# Patient Record
Sex: Female | Born: 1970
Health system: Southern US, Community
[De-identification: ages and names within clinical notes are randomized; demographics above are authoritative.]

## PROBLEM LIST (undated history)

## (undated) DIAGNOSIS — E119 Type 2 diabetes mellitus without complications: Secondary | ICD-10-CM

## (undated) DIAGNOSIS — J449 Chronic obstructive pulmonary disease, unspecified: Secondary | ICD-10-CM

## (undated) DIAGNOSIS — Z5189 Encounter for other specified aftercare: Secondary | ICD-10-CM

## (undated) DIAGNOSIS — D573 Sickle-cell trait: Secondary | ICD-10-CM

## (undated) DIAGNOSIS — I10 Essential (primary) hypertension: Secondary | ICD-10-CM

## (undated) DIAGNOSIS — K5090 Crohn's disease, unspecified, without complications: Secondary | ICD-10-CM

## (undated) DIAGNOSIS — K219 Gastro-esophageal reflux disease without esophagitis: Secondary | ICD-10-CM

## (undated) DIAGNOSIS — D649 Anemia, unspecified: Secondary | ICD-10-CM

## (undated) DIAGNOSIS — K5792 Diverticulitis of intestine, part unspecified, without perforation or abscess without bleeding: Secondary | ICD-10-CM

## (undated) DIAGNOSIS — IMO0001 Reserved for inherently not codable concepts without codable children: Secondary | ICD-10-CM

## (undated) HISTORY — PX: COLON SURGERY: SHX602

## (undated) HISTORY — PX: SMALL INTESTINE SURGERY: SHX150

---

## 1999-01-26 ENCOUNTER — Emergency Department (HOSPITAL_COMMUNITY): Admission: EM | Admit: 1999-01-26 | Discharge: 1999-01-26 | Payer: Self-pay | Admitting: Internal Medicine

## 1999-03-20 ENCOUNTER — Ambulatory Visit (HOSPITAL_BASED_OUTPATIENT_CLINIC_OR_DEPARTMENT_OTHER): Admission: RE | Admit: 1999-03-20 | Discharge: 1999-03-20 | Payer: Self-pay | Admitting: Orthopedic Surgery

## 1999-06-17 ENCOUNTER — Other Ambulatory Visit: Admission: RE | Admit: 1999-06-17 | Discharge: 1999-06-17 | Payer: Self-pay | Admitting: Obstetrics and Gynecology

## 1999-08-24 HISTORY — PX: ABDOMINAL HYSTERECTOMY: SHX81

## 1999-09-05 ENCOUNTER — Inpatient Hospital Stay (HOSPITAL_COMMUNITY): Admission: AD | Admit: 1999-09-05 | Discharge: 1999-09-05 | Payer: Self-pay | Admitting: Obstetrics & Gynecology

## 1999-11-08 ENCOUNTER — Inpatient Hospital Stay (HOSPITAL_COMMUNITY): Admission: AD | Admit: 1999-11-08 | Discharge: 1999-11-13 | Payer: Self-pay | Admitting: Obstetrics & Gynecology

## 1999-11-08 ENCOUNTER — Encounter (INDEPENDENT_AMBULATORY_CARE_PROVIDER_SITE_OTHER): Payer: Self-pay | Admitting: Specialist

## 2000-07-06 ENCOUNTER — Encounter: Payer: Self-pay | Admitting: Emergency Medicine

## 2000-07-06 ENCOUNTER — Emergency Department (HOSPITAL_COMMUNITY): Admission: EM | Admit: 2000-07-06 | Discharge: 2000-07-06 | Payer: Self-pay | Admitting: Emergency Medicine

## 2004-02-09 ENCOUNTER — Emergency Department (HOSPITAL_COMMUNITY): Admission: EM | Admit: 2004-02-09 | Discharge: 2004-02-09 | Payer: Self-pay | Admitting: Emergency Medicine

## 2004-11-23 ENCOUNTER — Emergency Department (HOSPITAL_COMMUNITY): Admission: EM | Admit: 2004-11-23 | Discharge: 2004-11-24 | Payer: Self-pay | Admitting: Emergency Medicine

## 2006-08-07 ENCOUNTER — Emergency Department (HOSPITAL_COMMUNITY): Admission: EM | Admit: 2006-08-07 | Discharge: 2006-08-07 | Payer: Self-pay | Admitting: Emergency Medicine

## 2006-12-15 ENCOUNTER — Emergency Department (HOSPITAL_COMMUNITY): Admission: EM | Admit: 2006-12-15 | Discharge: 2006-12-15 | Payer: Self-pay | Admitting: Emergency Medicine

## 2007-04-18 ENCOUNTER — Emergency Department (HOSPITAL_COMMUNITY): Admission: EM | Admit: 2007-04-18 | Discharge: 2007-04-18 | Payer: Self-pay | Admitting: Emergency Medicine

## 2007-09-18 ENCOUNTER — Emergency Department (HOSPITAL_COMMUNITY): Admission: EM | Admit: 2007-09-18 | Discharge: 2007-09-18 | Payer: Self-pay | Admitting: Emergency Medicine

## 2007-09-22 ENCOUNTER — Ambulatory Visit: Payer: Self-pay | Admitting: Internal Medicine

## 2007-09-22 ENCOUNTER — Ambulatory Visit: Payer: Self-pay | Admitting: *Deleted

## 2007-10-19 ENCOUNTER — Emergency Department (HOSPITAL_COMMUNITY): Admission: EM | Admit: 2007-10-19 | Discharge: 2007-10-19 | Payer: Self-pay | Admitting: Emergency Medicine

## 2007-11-15 ENCOUNTER — Encounter: Payer: Self-pay | Admitting: Family Medicine

## 2007-11-15 ENCOUNTER — Ambulatory Visit: Payer: Self-pay | Admitting: Internal Medicine

## 2007-11-15 LAB — CONVERTED CEMR LAB
Albumin: 4.2 g/dL (ref 3.5–5.2)
BUN: 7 mg/dL (ref 6–23)
Basophils Absolute: 0 10*3/uL (ref 0.0–0.1)
Basophils Relative: 0 % (ref 0–1)
CO2: 21 meq/L (ref 19–32)
Calcium: 9 mg/dL (ref 8.4–10.5)
Chloride: 109 meq/L (ref 96–112)
Creatinine, Ser: 0.67 mg/dL (ref 0.40–1.20)
Eosinophils Relative: 5 % (ref 0–5)
Hemoglobin: 13 g/dL (ref 12.0–15.0)
Lymphocytes Relative: 33 % (ref 12–46)
MCHC: 33.2 g/dL (ref 30.0–36.0)
MCV: 67.4 fL — ABNORMAL LOW (ref 78.0–100.0)
Monocytes Relative: 13 % — ABNORMAL HIGH (ref 3–12)
Neutro Abs: 2.2 10*3/uL (ref 1.7–7.7)
Potassium: 3.8 meq/L (ref 3.5–5.3)
RDW: 14.9 % (ref 11.5–15.5)
Total Bilirubin: 0.5 mg/dL (ref 0.3–1.2)
Total Protein: 7 g/dL (ref 6.0–8.3)
WBC: 4.6 10*3/uL (ref 4.0–10.5)

## 2007-11-21 ENCOUNTER — Ambulatory Visit (HOSPITAL_COMMUNITY): Admission: RE | Admit: 2007-11-21 | Discharge: 2007-11-21 | Payer: Self-pay | Admitting: Family Medicine

## 2007-11-28 ENCOUNTER — Encounter (INDEPENDENT_AMBULATORY_CARE_PROVIDER_SITE_OTHER): Payer: Self-pay | Admitting: Internal Medicine

## 2007-11-28 ENCOUNTER — Ambulatory Visit: Payer: Self-pay | Admitting: Family Medicine

## 2007-11-28 LAB — CONVERTED CEMR LAB
Iron: 76 ug/dL (ref 42–145)
Saturation Ratios: 30 % (ref 20–55)

## 2007-12-11 ENCOUNTER — Ambulatory Visit: Payer: Self-pay | Admitting: Internal Medicine

## 2008-03-11 ENCOUNTER — Ambulatory Visit: Payer: Self-pay | Admitting: Internal Medicine

## 2008-06-28 ENCOUNTER — Encounter (INDEPENDENT_AMBULATORY_CARE_PROVIDER_SITE_OTHER): Payer: Self-pay | Admitting: Adult Health

## 2008-06-28 ENCOUNTER — Ambulatory Visit: Payer: Self-pay | Admitting: Internal Medicine

## 2008-06-28 LAB — CONVERTED CEMR LAB
Alkaline Phosphatase: 51 units/L (ref 39–117)
BUN: 9 mg/dL (ref 6–23)
Basophils Absolute: 0 10*3/uL (ref 0.0–0.1)
Basophils Relative: 0 % (ref 0–1)
CO2: 24 meq/L (ref 19–32)
Creatinine, Ser: 0.75 mg/dL (ref 0.40–1.20)
Eosinophils Absolute: 0.2 10*3/uL (ref 0.0–0.7)
HCT: 38.5 % (ref 36.0–46.0)
Hemoglobin: 13 g/dL (ref 12.0–15.0)
MCV: 66.8 fL — ABNORMAL LOW (ref 78.0–100.0)
Neutro Abs: 3.7 10*3/uL (ref 1.7–7.7)
RBC: 5.76 M/uL — ABNORMAL HIGH (ref 3.87–5.11)
RDW: 14.3 % (ref 11.5–15.5)
Sodium: 143 meq/L (ref 135–145)
Total Bilirubin: 0.4 mg/dL (ref 0.3–1.2)
WBC: 6.9 10*3/uL (ref 4.0–10.5)

## 2008-07-07 ENCOUNTER — Emergency Department (HOSPITAL_COMMUNITY): Admission: EM | Admit: 2008-07-07 | Discharge: 2008-07-08 | Payer: Self-pay | Admitting: Emergency Medicine

## 2008-07-22 ENCOUNTER — Emergency Department (HOSPITAL_COMMUNITY): Admission: EM | Admit: 2008-07-22 | Discharge: 2008-07-22 | Payer: Self-pay | Admitting: Emergency Medicine

## 2008-08-25 ENCOUNTER — Emergency Department (HOSPITAL_COMMUNITY): Admission: EM | Admit: 2008-08-25 | Discharge: 2008-08-25 | Payer: Self-pay | Admitting: Family Medicine

## 2008-11-02 ENCOUNTER — Emergency Department (HOSPITAL_COMMUNITY): Admission: EM | Admit: 2008-11-02 | Discharge: 2008-11-02 | Payer: Self-pay | Admitting: Emergency Medicine

## 2009-02-12 ENCOUNTER — Emergency Department (HOSPITAL_COMMUNITY): Admission: EM | Admit: 2009-02-12 | Discharge: 2009-02-12 | Payer: Self-pay | Admitting: Emergency Medicine

## 2009-07-12 ENCOUNTER — Observation Stay (HOSPITAL_COMMUNITY): Admission: EM | Admit: 2009-07-12 | Discharge: 2009-07-14 | Payer: Self-pay | Admitting: Emergency Medicine

## 2009-10-09 ENCOUNTER — Encounter: Admission: RE | Admit: 2009-10-09 | Discharge: 2009-10-09 | Payer: Self-pay | Admitting: Family Medicine

## 2010-02-11 ENCOUNTER — Emergency Department (HOSPITAL_COMMUNITY): Admission: EM | Admit: 2010-02-11 | Discharge: 2010-02-11 | Payer: Self-pay | Admitting: Emergency Medicine

## 2010-11-25 LAB — DIFFERENTIAL
Basophils Absolute: 0.1 10*3/uL (ref 0.0–0.1)
Basophils Relative: 1 % (ref 0–1)
Lymphs Abs: 2.8 10*3/uL (ref 0.7–4.0)
Monocytes Absolute: 0.6 10*3/uL (ref 0.1–1.0)
Monocytes Relative: 7 % (ref 3–12)

## 2010-11-25 LAB — URINALYSIS, ROUTINE W REFLEX MICROSCOPIC
Bilirubin Urine: NEGATIVE
Nitrite: NEGATIVE
Urobilinogen, UA: 1 mg/dL (ref 0.0–1.0)
pH: 6.5 (ref 5.0–8.0)

## 2010-11-25 LAB — BASIC METABOLIC PANEL
BUN: 7 mg/dL (ref 6–23)
CO2: 25 mEq/L (ref 19–32)
CO2: 26 mEq/L (ref 19–32)
Calcium: 8.5 mg/dL (ref 8.4–10.5)
Calcium: 8.6 mg/dL (ref 8.4–10.5)
Chloride: 107 mEq/L (ref 96–112)
Chloride: 112 mEq/L (ref 96–112)
GFR calc Af Amer: >60 mL/min (ref >60)
GFR calc non Af Amer: >60 mL/min (ref >60)
GFR calc non Af Amer: >60 mL/min (ref >60)
Glucose, Bld: 90 mg/dL (ref 70–99)
Glucose, Bld: 94 mg/dL (ref 70–99)
Potassium: 3.4 mEq/L — ABNORMAL LOW (ref 3.5–5.1)
Potassium: 4 mEq/L (ref 3.5–5.1)
Potassium: 4.2 mEq/L (ref 3.5–5.1)
Sodium: 138 mEq/L (ref 135–145)
Sodium: 142 mEq/L (ref 135–145)

## 2010-11-25 LAB — CBC
HCT: 36.3 % (ref 36.0–46.0)
HCT: 37.7 % (ref 36.0–46.0)
HCT: 38.6 % (ref 36.0–46.0)
Hemoglobin: 11.2 g/dL — ABNORMAL LOW (ref 12.0–15.0)
Hemoglobin: 11.8 g/dL — ABNORMAL LOW (ref 12.0–15.0)
Hemoglobin: 12.4 g/dL (ref 12.0–15.0)
Hemoglobin: 12.5 g/dL (ref 12.0–15.0)
MCHC: 32.3 g/dL (ref 30.0–36.0)
MCHC: 32.9 g/dL (ref 30.0–36.0)
MCHC: 33 g/dL (ref 30.0–36.0)
MCV: 69.6 fL — ABNORMAL LOW (ref 78.0–100.0)
Platelets: 209 10*3/uL (ref 150–400)
Platelets: 242 10*3/uL (ref 150–400)
RBC: 5.21 MIL/uL — ABNORMAL HIGH (ref 3.87–5.11)
RBC: 5.43 MIL/uL — ABNORMAL HIGH (ref 3.87–5.11)
RDW: 14 % (ref 11.5–15.5)
RDW: 14.2 % (ref 11.5–15.5)
WBC: 4.9 10*3/uL (ref 4.0–10.5)
WBC: 8 10*3/uL (ref 4.0–10.5)

## 2010-11-25 LAB — COMPREHENSIVE METABOLIC PANEL
ALT: 13 U/L (ref 0–35)
AST: 18 U/L (ref 0–37)
Albumin: 3.8 g/dL (ref 3.5–5.2)
CO2: 25 mEq/L (ref 19–32)
GFR calc Af Amer: >60 mL/min (ref >60)
Glucose, Bld: 100 mg/dL — ABNORMAL HIGH (ref 70–99)
Potassium: 3.5 mEq/L (ref 3.5–5.1)
Sodium: 141 mEq/L (ref 135–145)

## 2010-11-25 LAB — GLUCOSE, CAPILLARY: Glucose-Capillary: 102 mg/dL — ABNORMAL HIGH (ref 70–99)

## 2010-11-30 LAB — URINALYSIS, ROUTINE W REFLEX MICROSCOPIC
Bilirubin Urine: NEGATIVE
Glucose, UA: NEGATIVE mg/dL
Hgb urine dipstick: NEGATIVE
Ketones, ur: 15 mg/dL — AB
Protein, ur: NEGATIVE mg/dL

## 2010-11-30 LAB — URINE CULTURE: Colony Count: >=100000

## 2010-11-30 LAB — CBC
Hemoglobin: 14.8 g/dL (ref 12.0–15.0)
MCHC: 32.8 g/dL (ref 30.0–36.0)
Platelets: 216 10*3/uL (ref 150–400)
RDW: 13.6 % (ref 11.5–15.5)

## 2010-11-30 LAB — COMPREHENSIVE METABOLIC PANEL
ALT: 12 U/L (ref 0–35)
Albumin: 3.9 g/dL (ref 3.5–5.2)
Calcium: 9.3 mg/dL (ref 8.4–10.5)
Glucose, Bld: 91 mg/dL (ref 70–99)
Sodium: 140 mEq/L (ref 135–145)
Total Protein: 7.5 g/dL (ref 6.0–8.3)

## 2010-11-30 LAB — LIPASE, BLOOD: Lipase: 14 U/L (ref 11–59)

## 2010-11-30 LAB — DIFFERENTIAL
Basophils Relative: 0 % (ref 0–1)
Eosinophils Relative: 1 % (ref 0–5)
Lymphocytes Relative: 12 % (ref 12–46)
Neutro Abs: 6.6 10*3/uL (ref 1.7–7.7)
Neutrophils Relative %: 82 % — ABNORMAL HIGH (ref 43–77)

## 2011-01-08 NOTE — Op Note (Signed)
[facility]  Patient:    Amanda Garner, Amanda Garner                     MRN: [id number] Proc. Date: 11/08/99 Adm. Date:  [id number] Attending:  Blanca Friend                           Operative Report  PREOPERATIVE DIAGNOSIS:  Abruption at 34 weeks.  POSTOPERATIVE DIAGNOSIS:  Abruption at 34 weeks, postpartum hemorrhage.  OPERATION:  Cesarean hysterectomy.  SURGEON:  Peterson Lombard, M.D.  ASSISTANT:  Donnel Saxon, C.N.M.  ANESTHESIA:  General.  ESTIMATED BLOOD LOSS:  2000 cc.  URINE OUTPUT:  250 cc.  FINDINGS:  Viable female infant, Apgars 2, 4 and 6.  Cord gas 6.81.  Weight is 4 pounds 6 ounces.  COMPLICATIONS:  Postpartum hemorrhage.  DRAINS:  Jackson-Pratt drain in the left intraperitoneal space.  There is a Jackson-Pratt drain to the right that is subfascial.  HISTORY:  The patient is a 40 year old gravida 3, para 2-0-0-2, who presented at 22 weeks estimated gestational age complaining of sudden onset of moderate vaginal  bleeding and severe abdominal pain that began on her way to the hospital.  The patient denied any trauma or previous contractions.  On initial vaginal examination, there was a small amount of bright red vaginal bleeding.  The vaginal examination showed a cervix that was 1 cm thick and the vertex was high. Uterine contractions were every one to two minutes, 30 seconds with minimal uterine relaxation in between.  Fetal heart tones were approximately 120s without decelerations or accelerations.  The abdomen was tender in all quadrants. Initially, the patient was bolused with magnesium sulfate in an attempt to diminish her contractions.  The bolus and infusion was running for approximately 30 minutes when the vaginal bleeding increased.  At this point the patient was consented for stat cesarean delivery.  DESCRIPTION OF PROCEDURE:  After an adequate level of general anesthesia was obtained, the patient was  prepped and draped in sterile fashion.  A Foley catheter had been placed in the bladder to drain the urine.  A low transverse incision was made down through subcutaneous fat to the fascia.  The fascia was incised with he scalpel.  The parietal peritoneum was entered by sharp dissection and the incision was extended bluntly.  The uterus was entered and the transverse cesarean fashion. The infants head was elevated and with fundal pressure, he was delivered.  He was suctioned on the operative field.  The cord was doubly clamped and cut and he was taken to the warmer where he was cared for by the nursing team.  Cord bloods were obtained.  The placenta was manually extracted.  Bladder pieces were placed inside the incision.  The uterus was grasped with ring clamps and the uterine incision was reapproximated with a running suture of #1 Vicryl. A second imbricating layer was used.  There was continued bright red vaginal bleeding from the fallopian tubes and from the uterine incision.  The uterus was removed from the pelvic region and massaged with a wet lap sponge.  Pitocin was given IV.  Methergine was given IM. Methergine was injected directly into the uterus.  An attempt was made to place a suture from the anterior-posterior region of the uterus in an attempt to achieve hemostasis.  This failed.  A red Robinson catheter was used to apply pressure at the level of the cervical  os to decrease the pulse pressure.  Patient continued to have a large amount of uterine bleeding.  At this point, it was discussed with he patients family and a plan was made to perform cesarean hysterectomy.  The round ligaments were then grasped and tied with a suture ligature of #0 Vicryl.  The round ligament was cut.  The broad ligament was pierced and the utero-ovarian ligaments were crossclamped, cut and suture ligated.  The uterine arteries were  then crossclamped, cut and suture ligated  bilaterally.  The uterosacral broad ligament complex was then crossclamped, cut and suture ligated to the level of he internal os.  The bladder was noted to be well below the surgical field.  The fundus was then removed.  The angles were made hemostatic with figure-of-eights at the 3 and 9 oclock position.  The top of the cervix was then closed with figure-of-eights of #0 Vicryl.  There were multiple points of oozing that was noted.  The majority of these were made hemostatic with figure-of-eights of #0 Vicryl.  Jackson-Pratt drain was then placed into the pelvis.  The pelvis was copiously irrigated.  This drain was brought to the left lower quadrant.  The peritoneum was then grasped and then reapproximated with a running suture of #3-0 Vicryl.  There was also a lot of subfascial bleeding.  An attempt was made to make it hemostatic with the Bovie and figure-of-eight sutures.  There was still a small amount of ooze that could not be controlled and therefore a subfascial Jackson-Pratt drain was placed and taken to the right lower quadrant.  The fascia was reapproximated with a running suture of #1 Vicryl.  The skin was closed with skin staples.  A pressure bandage was applied.  The patient tolerated the procedure well.  She was taken to the recovery room in stable condition.  Sponge, needle nd instrument counts were correct x 2. DD:  11/08/99 TD:  11/09/99 Job: [id number]

## 2011-01-08 NOTE — Discharge Summary (Signed)
[facility]  Patient:    Amanda Garner, Amanda Garner                     MRN: [id number] Adm. Date:  11/08/99 Disc. Date: 11/13/99 Attending:  Peterson Lombard, M.D. Dictator:   Donnel Saxon, C.N.M.                           Discharge Summary  ADMISSION DIAGNOSES:          1. Intrauterine pregnancy at 34 weeks.                               2. Third trimester bleeding.                               3. Previous cesarean section x 2.  DISCHARGE DIAGNOSES:          1. Abruptio placenta at 34 weeks.                               2. Postpartum hemorrhage.  PROCEDURES:                   1. Cesarean hysterectomy.                               2. General endotracheal anesthesia.                               3. Insertion of Jackson-Pratt drains.                               4. Magnesium sulfate bolus.  HISTORY OF PRESENT ILLNESS:   Ms. Amanda Garner is a 40 year old, gravid 3, para 2-0-0-2, who presented at 29 weeks on November 08, 1999, with sudden onset moderate vaginal bleeding and now severe abdominal pain that began on the way to the hospital following onset of bleeding.  The pregnancy had been remarkable for:  1. Two previous cesarean sections documented as low transverse.  2. Desired tubal ligation.  3. History of chorioamnionitis. 4. History of chronic hypertension, but no current medications.  5. Mild thrombocytopenia.  On admission, the patient was having a small amount of bright red bleeding.  Her abdomen was tender.  The fetal heart rate was reassuring.  Uterine contractions were every one to two minutes.  Peterson Lombard, M.D., was consulted and a magnesium sulfate bolus was begun. There was no change in the contraction pattern.  The cervix was 2 cm and the patient began to have more vaginal bleeding.  The fetal heart rate was in the 120s with no accelerations or decelerations.  The decision was made in light of the presumptive placental  abruption to proceed with cesarean section.  HOSPITAL COURSE:              The patient was taken to the operating room where a low transverse cesarean incision was made.  However, the patient continued to bleed.  The uterus was removed from the pelvic region and massaged with a wet lap sponge.  Pitocin was given IV.  Methergine was  given IM.  Methergine was also injected directly into the uterus.  There was an attempt made to place a suture from the anterior posterior region of the uterus in attempt to achieve hemostasis.  A large amount of uterine bleeding continued.  A consultation was held with the patients family and the plan was made to perform cesarean hysterectomy.  This was performed by Peterson Lombard, M.D.  The rest of the surgery was accomplished without significant difficulty.  There were multiple points of oozing that were made hemostatic with figure-of-eight sutures, but then two Jackson-Pratt drains were then placed in either lower quadrant.  The patient was taken to the recovery room in stable condition.  The infant was taken to he NICU for prematurity.  By postoperative day #1, the patient was doing well.  Her hemoglobin was 7.6, down from 8.8 predelivery.  Her platelets were 96, down from 106.  Her WBC count was 10.3.  Her physical exam was within normal limits.  She was having no dizziness.  The CBC was repeated, showing a hemoglobin of 6.4 and a platelet count of 87.  By postoperative day #2 again the patient was hemodynamically stable, but then had an episode of severe epigastric pain after eating.  She did have some milk products, which she has been typically intolerant to.  A Dulcolax suppository was given.  The patient then had resolution of this issue.  She began to have positive bowel sounds. The Jackson-Pratt drains continued to be within normal limits.  By postoperative day #3, the hemoglobin was 6.7 and platelets were 151,000.  The patient was beginning  to have some dizziness with ambulation.  Her temperature maximum was 100.1 degrees.  The risks and benefits of transfusion were reviewed.  She did elect to proceed with transfusion.  By postoperative day #4, the hemoglobin was up to 8.6.  The patient was having some reaction to the tape and Lotrisone cream was placed.  By postoperative day #5, the patient had been afebrile for greater than 24 hours.  She had a bowel movement.  Her drains were removed on November 12, 1999.  The rest of her physical exam was within normal limits and the infant was doing well.  Vanessa P. Haygood, M.D., saw the patient that day and elected to discharge her home with staples in place.  The decision was made to also try to arrange a consult with Aniceto Boss. Sherral Hammers, M.D., for evaluation of rash on her wrist.  DISPOSITION:                  The patient was deemed to have received full benefit of her hospital stay and was discharged home.  DISCHARGE INSTRUCTIONS:       Per [facility] handout and general hysterectomy discharge instructions.  DISCHARGE MEDICATIONS:        1. Motrin 600 mg p.o. q.6h. p.r.n. pain.                               2. Tylox one to two q.3-4h. p.r.n. pain.                               3. Senna-Gen one p.o. b.i.d.  DISCHARGE FOLLOW-UP:          Will occur on November 16, 1999, for staple removal and then at six weeks per [facility]  OB standards. DD:  06/07/00 TD:  06/07/00 Job: [id number]

## 2011-05-13 LAB — URINALYSIS, ROUTINE W REFLEX MICROSCOPIC
Bilirubin Urine: NEGATIVE
Glucose, UA: NEGATIVE
Hgb urine dipstick: NEGATIVE
Ketones, ur: NEGATIVE
pH: 7

## 2011-05-14 LAB — CBC
HCT: 36.6
MCV: 66.3 — ABNORMAL LOW
Platelets: 212
WBC: 5.3

## 2011-05-14 LAB — BASIC METABOLIC PANEL
BUN: 4 — ABNORMAL LOW
CO2: 20
Chloride: 108
Glucose, Bld: 87
Potassium: 3.7

## 2011-05-14 LAB — DIFFERENTIAL
Eosinophils Relative: 3
Lymphs Abs: 0.5 — ABNORMAL LOW
Monocytes Absolute: 0.5

## 2011-05-25 LAB — URINE MICROSCOPIC-ADD ON

## 2011-05-25 LAB — COMPREHENSIVE METABOLIC PANEL
ALT: 12
CO2: 22
Calcium: 8.6
Creatinine, Ser: 0.76
GFR calc non Af Amer: >60
Glucose, Bld: 112 — ABNORMAL HIGH
Total Bilirubin: 0.3

## 2011-05-25 LAB — CBC
Hemoglobin: 12
MCHC: 32.8
MCV: 69.3 — ABNORMAL LOW
RBC: 5.29 — ABNORMAL HIGH

## 2011-05-25 LAB — URINALYSIS, ROUTINE W REFLEX MICROSCOPIC
Bilirubin Urine: NEGATIVE
Hgb urine dipstick: NEGATIVE
Specific Gravity, Urine: 1.035 — ABNORMAL HIGH
Urobilinogen, UA: 0.2
pH: 6.5

## 2011-05-25 LAB — DIFFERENTIAL
Basophils Absolute: 0.1
Eosinophils Absolute: 0.3
Lymphocytes Relative: 36
Lymphs Abs: 2.8
Neutrophils Relative %: 50

## 2011-05-25 LAB — POCT PREGNANCY, URINE: Preg Test, Ur: NEGATIVE

## 2011-05-25 LAB — LIPASE, BLOOD: Lipase: 22

## 2011-06-04 LAB — I-STAT 8, (EC8 V) (CONVERTED LAB)
Acid-base deficit: 2
Bicarbonate: 22.1
Glucose, Bld: 96
Hemoglobin: 14.3
Operator id: 288831
Sodium: 138
TCO2: 23

## 2011-06-04 LAB — DIFFERENTIAL
Basophils Absolute: 0
Eosinophils Absolute: 0.2
Lymphocytes Relative: 22
Monocytes Relative: 9
Neutro Abs: 6.8
Neutrophils Relative %: 67

## 2011-06-04 LAB — URINALYSIS, ROUTINE W REFLEX MICROSCOPIC
Glucose, UA: NEGATIVE
Nitrite: NEGATIVE
Specific Gravity, Urine: 1.025
pH: 6

## 2011-06-04 LAB — URINE MICROSCOPIC-ADD ON

## 2011-06-04 LAB — CBC
MCHC: 33.3
Platelets: 250
RDW: 13.4

## 2011-07-31 ENCOUNTER — Encounter: Payer: Self-pay | Admitting: Nurse Practitioner

## 2011-07-31 ENCOUNTER — Emergency Department (HOSPITAL_COMMUNITY)
Admission: EM | Admit: 2011-07-31 | Discharge: 2011-07-31 | Disposition: A | Discharge status: Home / Self Care | Payer: Self-pay | Attending: Emergency Medicine | Admitting: Emergency Medicine

## 2011-07-31 DIAGNOSIS — R5383 Other fatigue: Secondary | ICD-10-CM | POA: Insufficient documentation

## 2011-07-31 DIAGNOSIS — R112 Nausea with vomiting, unspecified: Secondary | ICD-10-CM | POA: Insufficient documentation

## 2011-07-31 DIAGNOSIS — R059 Cough, unspecified: Secondary | ICD-10-CM | POA: Insufficient documentation

## 2011-07-31 DIAGNOSIS — R070 Pain in throat: Secondary | ICD-10-CM | POA: Insufficient documentation

## 2011-07-31 DIAGNOSIS — R5381 Other malaise: Secondary | ICD-10-CM | POA: Insufficient documentation

## 2011-07-31 DIAGNOSIS — R05 Cough: Secondary | ICD-10-CM | POA: Insufficient documentation

## 2011-07-31 DIAGNOSIS — R509 Fever, unspecified: Secondary | ICD-10-CM | POA: Insufficient documentation

## 2011-07-31 DIAGNOSIS — J3489 Other specified disorders of nose and nasal sinuses: Secondary | ICD-10-CM | POA: Insufficient documentation

## 2011-07-31 DIAGNOSIS — R630 Anorexia: Secondary | ICD-10-CM | POA: Insufficient documentation

## 2011-07-31 DIAGNOSIS — R6889 Other general symptoms and signs: Secondary | ICD-10-CM | POA: Insufficient documentation

## 2011-07-31 DIAGNOSIS — R51 Headache: Secondary | ICD-10-CM | POA: Insufficient documentation

## 2011-07-31 MED ORDER — OSELTAMIVIR PHOSPHATE 75 MG PO CAPS: 75.0000 mg | ORAL_CAPSULE | Freq: Two times a day (BID) | ORAL | Status: AC

## 2011-07-31 MED ORDER — ONDANSETRON 8 MG PO TBDP: 8.0000 mg | ORAL_TABLET | Freq: Three times a day (TID) | ORAL | Status: AC | PRN

## 2011-07-31 MED ORDER — HYDROCOD POLST-CHLORPHEN POLST 10-8 MG/5ML PO LQCR: 5.0000 mL | Freq: Two times a day (BID) | ORAL | Status: DC | PRN

## 2011-07-31 MED ORDER — DEXAMETHASONE SODIUM PHOSPHATE 10 MG/ML IJ SOLN: 10.0000 mg | Freq: Once | INTRAMUSCULAR | Status: DC

## 2011-07-31 MED ORDER — KETOROLAC TROMETHAMINE 60 MG/2ML IM SOLN: 60.0000 mg | Freq: Once | INTRAMUSCULAR | Status: DC

## 2011-07-31 MED ORDER — ONDANSETRON 4 MG PO TBDP: 8.0000 mg | ORAL_TABLET | Freq: Once | ORAL | Status: DC

## 2011-07-31 NOTE — ED Notes (Signed)
N/v/body aches/chills/fevrs since wednesday

## 2011-07-31 NOTE — ED Provider Notes (Signed)
Medical screening examination/treatment/procedure(s) were performed by non-physician practitioner and as supervising physician I was immediately available for consultation/collaboration.  Threasa Beards, MD 07/31/11 [phone]

## 2011-07-31 NOTE — ED Provider Notes (Signed)
History     CSN: [id number] Arrival date & time: 07/31/2011  9:40 AM   First MD Initiated Contact with Patient 07/31/11 1013      10:48 AM HPI Patient reports persistent flu-like symptoms since Thursday. States she's tried over-the-counter decongestants without relief. Reports a subjective fever.  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 States she's also began to have nausea and vomiting. Denies abdominal pain.  Patient is a 40 y.o. female presenting with flu symptoms.  Influenza This is a new problem. Episode onset: 2 days ago. Associated symptoms include anorexia, chills, congestion, coughing, fatigue, a fever, headaches, myalgias, nausea, a sore throat, vomiting and weakness. Pertinent negatives include no abdominal pain, chest pain, neck pain, numbness, rash, swollen glands or urinary symptoms.    History reviewed. No pertinent past medical history.  History reviewed. No pertinent past surgical history.  History reviewed. No pertinent family history.  History  Substance Use Topics  . Smoking status: Current Everyday Smoker -- 0.5 packs/day  . Smokeless tobacco: Not on file  . Alcohol Use: No    OB History    Grav Para Term Preterm Abortions TAB SAB Ect Mult Living                  Review of Systems  Constitutional: Positive for fever, chills and fatigue.  HENT: Positive for congestion and sore throat. Negative for neck pain.   Respiratory: Positive for cough.   Cardiovascular: Negative for chest pain.  Gastrointestinal: Positive for nausea, vomiting and anorexia. Negative for abdominal pain.  Genitourinary: Negative for dysuria, urgency, hematuria and flank pain.  Musculoskeletal: Positive for myalgias.  Skin: Negative for rash.  Neurological: Positive for weakness and headaches. Negative for numbness.  All other systems reviewed and are negative.    Allergies  Review of patient's allergies indicates no  known allergies.  Home Medications   Current Outpatient Rx  Name Route Sig Dispense Refill  . DM-DIPHENHYDRAMINE-APAP 10-12.5-325 MG/5ML PO LIQD Oral Take 10 mLs by mouth every 6 (six) hours.      Marland Kitchen ALKA-SELTZER PLUS COLD & FLU PO Oral Take 1 tablet by mouth 2 (two) times daily.        BP 128/87  Pulse 81  Temp(Src) 98.1 F (36.7 C) (Oral)  Resp 19  Ht 5' 6"  (1.676 m)  Wt 180 lb (81.647 kg)  BMI 29.05 kg/m2  SpO2 96%  Physical Exam  Vitals reviewed. Constitutional: She is oriented to person, place, and time. Vital signs are normal. She appears well-developed and well-nourished.  HENT:  Head: Normocephalic and atraumatic.  Right Ear: External ear normal.  Left Ear: External ear normal.  Nose: Nose normal.  Mouth/Throat: Oropharynx is clear and moist. No oropharyngeal exudate.  Eyes: Conjunctivae are normal. Pupils are equal, round, and reactive to light.  Neck: Normal range of motion. Neck supple. No spinous process tenderness and no muscular tenderness present. No rigidity.  Cardiovascular: Normal rate, regular rhythm and normal heart sounds.  Exam reveals no friction rub.   No murmur heard. Pulmonary/Chest: Effort normal and breath sounds normal. She has no wheezes. She has no rhonchi. She has no rales. She exhibits no tenderness.  Abdominal: Soft. Bowel sounds are normal. She exhibits no distension and no mass. There is no tenderness. There is no rebound and no guarding.  Musculoskeletal: Normal range of motion.  Neurological: She is alert and oriented to person, place, and time. No cranial  nerve deficit. Coordination normal.  Skin: Skin is warm and dry. No rash noted. No erythema. No pallor.    ED Course  Procedures   MDM          Sheliah Mends, [place] 07/31/11 1056

## 2011-08-25 ENCOUNTER — Emergency Department (HOSPITAL_COMMUNITY): Payer: Self-pay

## 2011-08-25 ENCOUNTER — Inpatient Hospital Stay (HOSPITAL_COMMUNITY)
Admission: EM | Admit: 2011-08-25 | Discharge: 2011-08-30 | DRG: 373 | Disposition: A | Discharge status: Home / Self Care | Payer: Self-pay | Source: Ambulatory Visit | Attending: Internal Medicine | Admitting: Internal Medicine

## 2011-08-25 ENCOUNTER — Encounter (HOSPITAL_COMMUNITY): Payer: Self-pay | Admitting: *Deleted

## 2011-08-25 DIAGNOSIS — F17200 Nicotine dependence, unspecified, uncomplicated: Secondary | ICD-10-CM | POA: Diagnosis present

## 2011-08-25 DIAGNOSIS — K5730 Diverticulosis of large intestine without perforation or abscess without bleeding: Secondary | ICD-10-CM | POA: Diagnosis present

## 2011-08-25 DIAGNOSIS — R197 Diarrhea, unspecified: Secondary | ICD-10-CM

## 2011-08-25 DIAGNOSIS — R933 Abnormal findings on diagnostic imaging of other parts of digestive tract: Secondary | ICD-10-CM

## 2011-08-25 DIAGNOSIS — K529 Noninfective gastroenteritis and colitis, unspecified: Secondary | ICD-10-CM | POA: Diagnosis present

## 2011-08-25 DIAGNOSIS — R935 Abnormal findings on diagnostic imaging of other abdominal regions, including retroperitoneum: Secondary | ICD-10-CM | POA: Diagnosis not present

## 2011-08-25 DIAGNOSIS — R109 Unspecified abdominal pain: Secondary | ICD-10-CM

## 2011-08-25 DIAGNOSIS — D509 Iron deficiency anemia, unspecified: Secondary | ICD-10-CM | POA: Diagnosis present

## 2011-08-25 DIAGNOSIS — E876 Hypokalemia: Secondary | ICD-10-CM | POA: Diagnosis not present

## 2011-08-25 DIAGNOSIS — K5900 Constipation, unspecified: Secondary | ICD-10-CM | POA: Diagnosis not present

## 2011-08-25 DIAGNOSIS — A0472 Enterocolitis due to Clostridium difficile, not specified as recurrent: Principal | ICD-10-CM

## 2011-08-25 DIAGNOSIS — K219 Gastro-esophageal reflux disease without esophagitis: Secondary | ICD-10-CM | POA: Diagnosis present

## 2011-08-25 HISTORY — DX: Reserved for inherently not codable concepts without codable children: IMO0001

## 2011-08-25 HISTORY — DX: Anemia, unspecified: D64.9

## 2011-08-25 HISTORY — DX: Diverticulitis of intestine, part unspecified, without perforation or abscess without bleeding: K57.92

## 2011-08-25 HISTORY — DX: Encounter for other specified aftercare: Z51.89

## 2011-08-25 HISTORY — DX: Sickle-cell trait: D57.3

## 2011-08-25 LAB — CBC
HCT: 40.4 % (ref 36.0–46.0)
MCH: 23.1 pg — ABNORMAL LOW (ref 26.0–34.0)
MCV: 66.8 fL — ABNORMAL LOW (ref 78.0–100.0)
RDW: 14.7 % (ref 11.5–15.5)
WBC: 7.2 10*3/uL (ref 4.0–10.5)

## 2011-08-25 LAB — COMPREHENSIVE METABOLIC PANEL
CO2: 20 mEq/L (ref 19–32)
Calcium: 9.3 mg/dL (ref 8.4–10.5)
Creatinine, Ser: 0.64 mg/dL (ref 0.50–1.10)
GFR calc Af Amer: >90 mL/min (ref >90)
GFR calc non Af Amer: >90 mL/min (ref >90)
Glucose, Bld: 100 mg/dL — ABNORMAL HIGH (ref 70–99)

## 2011-08-25 LAB — URINE MICROSCOPIC-ADD ON

## 2011-08-25 LAB — DIFFERENTIAL
Basophils Absolute: 0 10*3/uL (ref 0.0–0.1)
Eosinophils Relative: 2 % (ref 0–5)
Lymphocytes Relative: 18 % (ref 12–46)
Lymphs Abs: 1.3 10*3/uL (ref 0.7–4.0)
Monocytes Absolute: 0.5 10*3/uL (ref 0.1–1.0)

## 2011-08-25 LAB — URINALYSIS, ROUTINE W REFLEX MICROSCOPIC
Nitrite: NEGATIVE
Protein, ur: NEGATIVE mg/dL
Urobilinogen, UA: 0.2 mg/dL (ref 0.0–1.0)

## 2011-08-25 MED ORDER — SODIUM CHLORIDE 0.9 % IV BOLUS (SEPSIS)
1000.0000 mL | Freq: Once | INTRAVENOUS | Status: AC
  Administered 2011-08-25: 1000 mL via INTRAVENOUS

## 2011-08-25 MED ORDER — HYDROMORPHONE HCL PF 1 MG/ML IJ SOLN
1.0000 mg | Freq: Once | INTRAMUSCULAR | Status: AC
  Administered 2011-08-25: 1 mg via INTRAVENOUS
  Filled 2011-08-25: qty 1

## 2011-08-25 MED ORDER — ONDANSETRON HCL 4 MG/2ML IJ SOLN
4.0000 mg | Freq: Once | INTRAMUSCULAR | Status: AC
  Administered 2011-08-25: 4 mg via INTRAVENOUS
  Filled 2011-08-25: qty 2

## 2011-08-25 MED ORDER — IOHEXOL 300 MG/ML  SOLN
20.0000 mL | INTRAMUSCULAR | Status: AC
  Administered 2011-08-25: 20 mL via ORAL

## 2011-08-25 MED ORDER — IOHEXOL 300 MG/ML  SOLN
100.0000 mL | Freq: Once | INTRAMUSCULAR | Status: AC | PRN
  Administered 2011-08-25: 100 mL via INTRAVENOUS

## 2011-08-25 NOTE — ED Provider Notes (Signed)
History     CSN: [id number]  Arrival date & time 08/25/11  1456   First MD Initiated Contact with Patient 08/25/11 1805      Chief Complaint  Patient presents with  . Abdominal Pain  . Emesis  . Diarrhea    (Consider location/radiation/quality/duration/timing/severity/associated sxs/prior treatment) Patient is a 41 y.o. female presenting with abdominal pain, vomiting, and diarrhea.  Abdominal Pain The primary symptoms of the illness include abdominal pain, nausea, vomiting and diarrhea. The primary symptoms of the illness do not include fever or shortness of breath.  Symptoms associated with the illness do not include chills.  Emesis  Associated symptoms include abdominal pain and diarrhea. Pertinent negatives include no chills, no cough and no fever.  Diarrhea The primary symptoms include abdominal pain, nausea, vomiting and diarrhea. Primary symptoms do not include fever or rash.  The illness does not include chills.   history from patient Patient is a 41 year old female with history of diverticulitis who presents with abdominal pain. It is located in the periumbilical and lower abdomen. It started this morning abruptly. It has been constant and waxing and waning. No radiation. Patient notes associated nausea, vomiting (nonbilious nonbloody), and diarrhea. She has had mild bright red blood when wiping after stool but has not noted stools to have blood or blood in the toilet. She denies vaginal bleeding or vaginal discharge and has had partial hysterectomy. She notes mild dysuria over the last couple days. No history of recurrent UTI or prior pyelo.  She has not had fever. She states this does feel similar to prior diverticulitis except it is more severe. Patient has tried TUMS but pain has not improved. No other modifying factors noted. Overall severity moderate to severe.  Past Medical History  Diagnosis Date  . Diverticulitis   . Blood transfusion   . Anemia     Past Surgical  History  Procedure Date  . Cesarean section [id number]; 1997; 2001    Family History  Problem Relation Age of Onset  . Diabetes type II Mother   . Diabetes type II Father   . Stroke Father     History  Substance Use Topics  . Smoking status: Former Smoker -- 0.5 packs/day for 15 years    Types: Cigarettes    Quit date: 08/24/2011  . Smokeless tobacco: Never Used  . Alcohol Use: No    OB History    Grav Para Term Preterm Abortions TAB SAB Ect Mult Living                  Review of Systems  Constitutional: Negative for fever and chills.  HENT: Negative for facial swelling.   Eyes: Negative for visual disturbance.  Respiratory: Negative for cough, chest tightness, shortness of breath and wheezing.   Cardiovascular: Negative for chest pain.  Gastrointestinal: Positive for nausea, vomiting, abdominal pain and diarrhea. Negative for rectal pain.  Genitourinary: Negative for difficulty urinating.  Skin: Negative for rash.  Neurological: Negative for weakness and numbness.  Psychiatric/Behavioral: Negative for behavioral problems and confusion.  All other systems reviewed and are negative.    Allergies  Review of patient's allergies indicates no known allergies.  Home Medications  No current outpatient prescriptions on file.  BP 132/86  Pulse 80  Temp(Src) 98 F (36.7 C) (Oral)  Resp 16  Ht 5' 6"  (1.676 m)  Wt 185 lb (83.915 kg)  BMI 29.86 kg/m2  SpO2 97%  LMP 08/20/2011  Physical Exam  Nursing note and vitals  reviewed. Constitutional: She is oriented to person, place, and time. She appears well-developed and well-nourished. No distress.  HENT:  Head: Normocephalic.  Nose: Nose normal.  Eyes: EOM are normal.  Neck: Normal range of motion. Neck supple.  Cardiovascular: Normal rate, regular rhythm and intact distal pulses.   Pulmonary/Chest: Effort normal and breath sounds normal. No respiratory distress. She exhibits no tenderness.  Abdominal: Soft. She exhibits  no distension. There is tenderness (Moderate tenderness to palpation diffusely and moderate to severe tenderness to palpation in left lower quadrant. No rebound or guarding. No peritonitis. No suprapubic tenderness palpation).  Genitourinary:       Nontender rectal exam with normal colored stool and Hemoccult negative.  Musculoskeletal: Normal range of motion. She exhibits no edema and no tenderness.  Neurological: She is alert and oriented to person, place, and time.       Normal strength  Skin: Skin is warm and dry. No rash noted. She is not diaphoretic.  Psychiatric: She has a normal mood and affect. Her behavior is normal. Thought content normal.    ED Course  Procedures (including critical care time)  Labs Reviewed  CBC - Abnormal; Notable for the following:    RBC 6.05 (*)    MCV 66.8 (*)    MCH 23.1 (*)    All other components within normal limits  COMPREHENSIVE METABOLIC PANEL - Abnormal; Notable for the following:    Potassium 3.4 (*)    Glucose, Bld 100 (*)    All other components within normal limits  URINALYSIS, ROUTINE W REFLEX MICROSCOPIC - Abnormal; Notable for the following:    APPearance CLOUDY (*)    Hgb urine dipstick TRACE (*)    Ketones, ur 15 (*)    Leukocytes, UA TRACE (*)    All other components within normal limits  URINE MICROSCOPIC-ADD ON - Abnormal; Notable for the following:    Squamous Epithelial / LPF MANY (*)    All other components within normal limits  DIFFERENTIAL  LIPASE, BLOOD  OCCULT BLOOD, POC DEVICE  POCT OCCULT BLOOD STOOL, DEVICE  POCT CBG MONITORING  CLOSTRIDIUM DIFFICILE BY PCR  STOOL CULTURE  PREGNANCY, URINE   Ct Abdomen Pelvis W Contrast  08/25/2011  *RADIOLOGY REPORT*  Clinical Data: Left lower quadrant abdominal pain, vomiting, diarrhea  CT ABDOMEN AND PELVIS WITH CONTRAST  Technique:  Multidetector CT imaging of the abdomen and pelvis was performed following the standard protocol during bolus administration of intravenous  contrast.  Contrast: 58m OMNIPAQUE IOHEXOL 300 MG/ML IV SOLN, 1035mOMNIPAQUE IOHEXOL 300 MG/ML IV SOLN  Comparison: 02/12/2009  Findings: Mild dependent changes in the lung bases.  Small esophageal hiatal hernia with suggestion of mild thickening of the lower esophageal wall which might represent reflux disease.  The liver, spleen, gallbladder, pancreas, adrenal glands, abdominal aorta, and retroperitoneal lymph nodes are unremarkable.  Mild atrophy or scarring in the lower pole of the right kidney.  No solid mass or hydronephrosis demonstrated in the kidneys.  No gastric wall thickening.    Small broad-based umbilical hernia containing fat.  Adjacent circumscribed fat lobule in the subcutaneous soft tissues suggesting either lipoma or small fat containing hernia.  No free air in the abdomen.  Pelvis:  There is diffuse wall thickening and edema involving multiple loops of small bowel in the lower abdomen and pelvis. There is surrounding inflammatory stranding in the mesenteric fat. Small free fluid collections in the pericolic gutters and extending down into the pelvis. Density measurements of the  fluid are intermediate, likely representing ascites. Hemorrhage not entirely excluded.  The colon is decompressed and predominately stool filled although there is a suggestion of possible thickening of the wall of the cecal region.  Scattered diverticula.  The appearance is consistent with enteritis and may be due to inflammatory/infectious cause, Crohn disease, or ischemia.  Flow is demonstrated in the superior mesenteric artery and veins, arguing against ischemia.  No free air or pneumatosis.  No proximal small bowel dilatation.  Normal alignment of the lumbar vertebrae. Uterus and adnexal structures are not enlarged.  Diverticulosis of the sigmoid colon without inflammatory change.  Appendix is not visualized. Mesenteric lymph nodes are moderately prominent but not pathologically enlarged consistent with reactive  change.  IMPRESSION: Diffuse wall thickening and edema of the distal small bowel per involving ileum with mild dilatation and inflammatory stranding in the mesentery.  Small amount of fluid in the pelvis and mesentery. Possible involvement of the cecum.  Inflammatory or reactive lymph nodes in the mesentery.  Changes suggest enteritis and may represent Crohn disease or infectious/inflammatory cause.  Ischemia is less likely.  Results discussed with Dr. Larose Kells at the time of dictation, 2116 hours on 08/25/2011  Original Report Authenticated By: Neale Burly, M.D.     1. Enteritis       MDM    patient with abdominal pain. On her initial examination, patient with moderate to severe left-sided abdominal tenderness to palpation. Labs overall remarkable. Patient had received 2 rounds of IV narcotics and still with severe pain. Based on severity pain in location with known history of diverticulitis, CT scan ordered. Patient transferred to CDU and care transferred to CDU provider. CT scan pending.        Fritz Pickerel 08/26/11 0116

## 2011-08-25 NOTE — ED Notes (Signed)
States pain is 10/10 at its worst

## 2011-08-25 NOTE — ED Provider Notes (Signed)
8:35 PM Pt care resumed from Dr. Patria Mane patient presented to the emergency department with nausea, vomiting, diarrhea and a history of diverticulitis.  Patient's pain is located in the periumbilical region and has been managed with Dilaudid while in the emergency department.  A CT has been ordered to rule out acute diverticulitis and possible perforation.  Further treatment pending results of CT abdomen and pelvis.   Patient arrived dehydrated and tachycardic.  While in the emergency department she has received 2 L boluses of fluids.  In reassessing her vital signs she is currently hemodynamically stable and in no acute distress.  Patient's heart rate was 84 blood pressure 130/85 and her O2 saturation is 99.  Pt states does not have  PCP or GI doctor.   CT IMPRESSION: Diffuse wall thickening and edema of the distal small bowel per involving ileum with mild dilatation and inflammatory stranding in the mesentery. Small amount of fluid in the pelvis and mesentery. Possible involvement of the cecum. Inflammatory or reactive lymph nodes in the mesentery. Changes suggest enteritis and may represent Crohn disease or infectious/inflammatory cause. Ischemia is less likely.   10:21 PM Pt admitted to [facility] for Enteritis pain mngt and fluid hydration.  [place], [place] 08/25/11 2223

## 2011-08-25 NOTE — H&P (Signed)
Amanda Garner is an 41 y.o. female.  PCP - None.  Chief Complaint: Abdominal pain and nausea and vomiting. HPI: 41 year-old female with no significant past medical history presently complains of abdominal pain with multiple episodes of nausea and vomiting and diarrhea since today morning. She denies having had any uncooked food or recent travel or recent use of any antibiotics. The pain is periumbilical colicky in nature. In the ER patient had a CAT scan of the abdomen and pelvis which shows enteritis involving the ileum. Patient will be admitted for further management.  Past Medical History  Diagnosis Date  . Diverticulitis     Past Surgical History  Procedure Date  . Cesarean section     Family History  Problem Relation Age of Onset  . Diabetes type II Mother   . Diabetes type II Father   . Stroke Father    Social History:  reports that she has quit smoking. She does not have any smokeless tobacco history on file. She reports that she does not drink alcohol or use illicit drugs.  Allergies: No Known Allergies  Medications Prior to Admission  Medication Dose Route Frequency Provider Last Rate Last Dose  . HYDROmorphone (DILAUDID) injection 1 mg  1 mg Intravenous Once [employer]   1 mg at 08/25/11 1834  . HYDROmorphone (DILAUDID) injection 1 mg  1 mg Intravenous Once [employer]   1 mg at 08/25/11 1857  . HYDROmorphone (DILAUDID) injection 1 mg  1 mg Intravenous Once [employer]   1 mg at 08/25/11 2008  . HYDROmorphone (DILAUDID) injection 1 mg  1 mg Intravenous Once [place]   1 mg at 08/25/11 2150  . iohexol (OMNIPAQUE) 300 MG/ML solution 100 mL  100 mL Intravenous Once PRN Medication Radiologist   100 mL at 08/25/11 2107  . iohexol (OMNIPAQUE) 300 MG/ML solution 20 mL  20 mL Oral Q1 Hr x 2 Medication Radiologist   20 mL at 08/25/11 2107  . ondansetron (ZOFRAN) injection 4 mg  4 mg Intravenous Once [employer]   4 mg at 08/25/11 1833  . ondansetron  (ZOFRAN) injection 4 mg  4 mg Intravenous Once [employer]   4 mg at 08/25/11 2009  . ondansetron (ZOFRAN) injection 4 mg  4 mg Intravenous Once [place]   4 mg at 08/25/11 2150  . sodium chloride 0.9 % bolus 1,000 mL  1,000 mL Intravenous Once [employer]   1,000 mL at 08/25/11 1832  . sodium chloride 0.9 % bolus 1,000 mL  1,000 mL Intravenous Once [employer]   1,000 mL at 08/25/11 1832   No current outpatient prescriptions on file as of 08/25/2011.    Results for orders placed during the hospital encounter of 08/25/11 (from the past 48 hour(s))  CBC     Status: Abnormal   Collection Time   08/25/11  6:23 PM      Component Value Range Comment   WBC 7.2  4.0 - 10.5 (K/uL)    RBC 6.05 (*) 3.87 - 5.11 (MIL/uL)    Hemoglobin 14.0  12.0 - 15.0 (g/dL)    HCT 40.4  36.0 - 46.0 (%)    MCV 66.8 (*) 78.0 - 100.0 (fL)    MCH 23.1 (*) 26.0 - 34.0 (pg)    MCHC 34.7  30.0 - 36.0 (g/dL)    RDW 14.7  11.5 - 15.5 (%)    Platelets 184  150 - 400 (K/uL)   DIFFERENTIAL  Status: Normal   Collection Time   08/25/11  6:23 PM      Component Value Range Comment   Neutrophils Relative 73  43 - 77 (%)    Neutro Abs 5.3  1.7 - 7.7 (K/uL)    Lymphocytes Relative 18  12 - 46 (%)    Lymphs Abs 1.3  0.7 - 4.0 (K/uL)    Monocytes Relative 7  3 - 12 (%)    Monocytes Absolute 0.5  0.1 - 1.0 (K/uL)    Eosinophils Relative 2  0 - 5 (%)    Eosinophils Absolute 0.1  0.0 - 0.7 (K/uL)    Basophils Relative 0  0 - 1 (%)    Basophils Absolute 0.0  0.0 - 0.1 (K/uL)   COMPREHENSIVE METABOLIC PANEL     Status: Abnormal   Collection Time   08/25/11  6:23 PM      Component Value Range Comment   Sodium 135  135 - 145 (mEq/L)    Potassium 3.4 (*) 3.5 - 5.1 (mEq/L)    Chloride 103  96 - 112 (mEq/L)    CO2 20  19 - 32 (mEq/L)    Glucose, Bld 100 (*) 70 - 99 (mg/dL)    BUN 8  6 - 23 (mg/dL)    Creatinine, Ser 0.64  0.50 - 1.10 (mg/dL)    Calcium 9.3  8.4 - 10.5 (mg/dL)    Total Protein 7.4  6.0 - 8.3  (g/dL)    Albumin 3.6  3.5 - 5.2 (g/dL)    AST 15  0 - 37 (U/L)    ALT 9  0 - 35 (U/L)    Alkaline Phosphatase 57  39 - 117 (U/L)    Total Bilirubin 0.4  0.3 - 1.2 (mg/dL)    GFR calc non Af Amer >90  >90 (mL/min)    GFR calc Af Amer >90  >90 (mL/min)   LIPASE, BLOOD     Status: Normal   Collection Time   08/25/11  6:23 PM      Component Value Range Comment   Lipase 14  11 - 59 (U/L)   URINALYSIS, ROUTINE W REFLEX MICROSCOPIC     Status: Abnormal   Collection Time   08/25/11  6:24 PM      Component Value Range Comment   Color, Urine YELLOW  YELLOW     APPearance CLOUDY (*) CLEAR     Specific Gravity, Urine 1.023  1.005 - 1.030     pH 5.5  5.0 - 8.0     Glucose, UA NEGATIVE  NEGATIVE (mg/dL)    Hgb urine dipstick TRACE (*) NEGATIVE     Bilirubin Urine NEGATIVE  NEGATIVE     Ketones, ur 15 (*) NEGATIVE (mg/dL)    Protein, ur NEGATIVE  NEGATIVE (mg/dL)    Urobilinogen, UA 0.2  0.0 - 1.0 (mg/dL)    Nitrite NEGATIVE  NEGATIVE     Leukocytes, UA TRACE (*) NEGATIVE    URINE MICROSCOPIC-ADD ON     Status: Abnormal   Collection Time   08/25/11  6:24 PM      Component Value Range Comment   Squamous Epithelial / LPF MANY (*) RARE     WBC, UA 3-6  <3 (WBC/hpf)    RBC / HPF 0-2  <3 (RBC/hpf)    Bacteria, UA RARE  RARE     Urine-Other MUCOUS PRESENT     OCCULT BLOOD, POC DEVICE     Status: Normal  Collection Time   08/25/11  8:06 PM      Component Value Range Comment   Fecal Occult Bld NEGATIVE      Ct Abdomen Pelvis W Contrast  08/25/2011  *RADIOLOGY REPORT*  Clinical Data: Left lower quadrant abdominal pain, vomiting, diarrhea  CT ABDOMEN AND PELVIS WITH CONTRAST  Technique:  Multidetector CT imaging of the abdomen and pelvis was performed following the standard protocol during bolus administration of intravenous contrast.  Contrast: 69m OMNIPAQUE IOHEXOL 300 MG/ML IV SOLN, 1072mOMNIPAQUE IOHEXOL 300 MG/ML IV SOLN  Comparison: 02/12/2009  Findings: Mild dependent changes in the lung bases.   Small esophageal hiatal hernia with suggestion of mild thickening of the lower esophageal wall which might represent reflux disease.  The liver, spleen, gallbladder, pancreas, adrenal glands, abdominal aorta, and retroperitoneal lymph nodes are unremarkable.  Mild atrophy or scarring in the lower pole of the right kidney.  No solid mass or hydronephrosis demonstrated in the kidneys.  No gastric wall thickening.    Small broad-based umbilical hernia containing fat.  Adjacent circumscribed fat lobule in the subcutaneous soft tissues suggesting either lipoma or small fat containing hernia.  No free air in the abdomen.  Pelvis:  There is diffuse wall thickening and edema involving multiple loops of small bowel in the lower abdomen and pelvis. There is surrounding inflammatory stranding in the mesenteric fat. Small free fluid collections in the pericolic gutters and extending down into the pelvis. Density measurements of the fluid are intermediate, likely representing ascites. Hemorrhage not entirely excluded.  The colon is decompressed and predominately stool filled although there is a suggestion of possible thickening of the wall of the cecal region.  Scattered diverticula.  The appearance is consistent with enteritis and may be due to inflammatory/infectious cause, Crohn disease, or ischemia.  Flow is demonstrated in the superior mesenteric artery and veins, arguing against ischemia.  No free air or pneumatosis.  No proximal small bowel dilatation.  Normal alignment of the lumbar vertebrae. Uterus and adnexal structures are not enlarged.  Diverticulosis of the sigmoid colon without inflammatory change.  Appendix is not visualized. Mesenteric lymph nodes are moderately prominent but not pathologically enlarged consistent with reactive change.  IMPRESSION: Diffuse wall thickening and edema of the distal small bowel per involving ileum with mild dilatation and inflammatory stranding in the mesentery.  Small amount of  fluid in the pelvis and mesentery. Possible involvement of the cecum.  Inflammatory or reactive lymph nodes in the mesentery.  Changes suggest enteritis and may represent Crohn disease or infectious/inflammatory cause.  Ischemia is less likely.  Results discussed with Dr. PaLarose Kellst the time of dictation, 2116 hours on 08/25/2011  Original Report Authenticated By: WINeale BurlyM.D.    Review of Systems  Constitutional: Negative.   HENT: Negative.   Eyes: Negative.   Respiratory: Negative.   Cardiovascular: Negative.   Gastrointestinal: Positive for nausea, vomiting, abdominal pain and diarrhea.  Genitourinary: Negative.   Musculoskeletal: Negative.   Skin: Negative.   Neurological: Negative.   Endo/Heme/Allergies: Negative.   Psychiatric/Behavioral: Negative.     Blood pressure 128/85, pulse 83, temperature 98.2 F (36.8 C), temperature source Oral, resp. rate 16, height 5' 6"  (1.676 m), weight 83.915 kg (185 lb), SpO2 99.00%. Physical Exam  Constitutional: She is oriented to person, place, and time. She appears well-developed and well-nourished. No distress.  HENT:  Head: Normocephalic and atraumatic.  Right Ear: External ear normal.  Left Ear: External ear normal.  Nose: Nose normal.  Mouth/Throat: Oropharynx is clear and moist. No oropharyngeal exudate.  Eyes: Conjunctivae and EOM are normal. Pupils are equal, round, and reactive to light. Right eye exhibits no discharge. Left eye exhibits no discharge.  Neck: Normal range of motion. Neck supple.  Cardiovascular: Normal rate, regular rhythm and normal heart sounds.   Respiratory: Effort normal and breath sounds normal. No respiratory distress. She has no wheezes. She has no rales.  GI: Soft. Bowel sounds are normal. She exhibits no distension. There is tenderness. There is no rebound and no guarding.  Musculoskeletal: Normal range of motion. She exhibits no edema and no tenderness.  Neurological: She is alert and oriented to  person, place, and time.       Moves upper and lower limbs.  Skin: Skin is warm and dry. She is not diaphoretic.  Psychiatric: Her behavior is normal.     Assessment/Plan #1. Enteritis - as per the CAT scan it is involving the ileum. For now and will keep patient n.p.o. and hydrate. Once her nausea vomiting improves we may start clear liquids and advance as tolerated. We will get stool studies including culture and C. difficile. Patient will be placed on ciprofloxacin and Flagyl. Patient eventually will need a gastroenterology consult as inpatient or as outpatient may need to rule out inflammatory bowel disease. #2. History of diverticulitis.  CODE STATUS - full code  Magda Muise N. 08/25/2011, 11:40 PM

## 2011-08-25 NOTE — ED Notes (Signed)
Patient reports onset of n/v/d and feeling like something is biting at her navel.  Patient states she has never had sx like this before

## 2011-08-25 NOTE — ED Notes (Signed)
Complaining of abdominal pain with diarrhea and nausea which started this morning. States has history of diverticulitis. Rates the pain 9/10

## 2011-08-26 ENCOUNTER — Inpatient Hospital Stay (HOSPITAL_COMMUNITY): Payer: Self-pay

## 2011-08-26 ENCOUNTER — Encounter (HOSPITAL_COMMUNITY): Payer: Self-pay | Admitting: General Practice

## 2011-08-26 DIAGNOSIS — A0472 Enterocolitis due to Clostridium difficile, not specified as recurrent: Secondary | ICD-10-CM

## 2011-08-26 DIAGNOSIS — R109 Unspecified abdominal pain: Secondary | ICD-10-CM

## 2011-08-26 DIAGNOSIS — R197 Diarrhea, unspecified: Secondary | ICD-10-CM

## 2011-08-26 DIAGNOSIS — R933 Abnormal findings on diagnostic imaging of other parts of digestive tract: Secondary | ICD-10-CM

## 2011-08-26 LAB — OCCULT BLOOD X 1 CARD TO LAB, STOOL: Fecal Occult Bld: NEGATIVE

## 2011-08-26 LAB — RETICULOCYTES
RBC.: 5.1 MIL/uL (ref 3.87–5.11)
Retic Ct Pct: 1.7 % (ref 0.4–3.1)

## 2011-08-26 LAB — PREGNANCY, URINE: Preg Test, Ur: NEGATIVE

## 2011-08-26 LAB — GLUCOSE, CAPILLARY
Glucose-Capillary: 103 mg/dL — ABNORMAL HIGH (ref 70–99)
Glucose-Capillary: 109 mg/dL — ABNORMAL HIGH (ref 70–99)
Glucose-Capillary: 81 mg/dL (ref 70–99)

## 2011-08-26 LAB — VITAMIN B12: Vitamin B-12: 554 pg/mL (ref 211–911)

## 2011-08-26 LAB — FERRITIN: Ferritin: 210 ng/mL (ref 10–291)

## 2011-08-26 MED ORDER — ONDANSETRON HCL 4 MG PO TABS: 4.0000 mg | ORAL_TABLET | Freq: Four times a day (QID) | ORAL | Status: DC | PRN

## 2011-08-26 MED ORDER — WHITE PETROLATUM GEL
Status: AC
  Filled 2011-08-26: qty 5

## 2011-08-26 MED ORDER — BIOTENE DRY MOUTH MT LIQD
15.0000 mL | Freq: Two times a day (BID) | OROMUCOSAL | Status: DC
  Administered 2011-08-26 – 2011-08-27 (×3): 15 mL via OROMUCOSAL

## 2011-08-26 MED ORDER — ACETAMINOPHEN 650 MG RE SUPP: 650.0000 mg | Freq: Four times a day (QID) | RECTAL | Status: DC | PRN

## 2011-08-26 MED ORDER — CHLORHEXIDINE GLUCONATE 0.12 % MT SOLN
15.0000 mL | Freq: Two times a day (BID) | OROMUCOSAL | Status: DC
  Administered 2011-08-26 – 2011-08-27 (×2): 15 mL via OROMUCOSAL
  Filled 2011-08-26: qty 15

## 2011-08-26 MED ORDER — SODIUM CHLORIDE 0.9 % IV SOLN
INTRAVENOUS | Status: DC
  Administered 2011-08-26 – 2011-08-29 (×11): via INTRAVENOUS

## 2011-08-26 MED ORDER — HYDROMORPHONE HCL PF 1 MG/ML IJ SOLN
0.5000 mg | INTRAMUSCULAR | Status: DC | PRN
  Administered 2011-08-26 – 2011-08-27 (×5): 1 mg via INTRAVENOUS
  Filled 2011-08-26 (×6): qty 1

## 2011-08-26 MED ORDER — HYDROMORPHONE HCL PF 1 MG/ML IJ SOLN
1.0000 mg | Freq: Once | INTRAMUSCULAR | Status: AC
  Administered 2011-08-26: 1 mg via INTRAVENOUS
  Filled 2011-08-26: qty 1

## 2011-08-26 MED ORDER — SACCHAROMYCES BOULARDII 250 MG PO CAPS
250.0000 mg | ORAL_CAPSULE | Freq: Two times a day (BID) | ORAL | Status: DC
  Administered 2011-08-26 – 2011-08-29 (×7): 250 mg via ORAL
  Filled 2011-08-26 (×9): qty 1

## 2011-08-26 MED ORDER — HYDROMORPHONE HCL PF 1 MG/ML IJ SOLN
0.5000 mg | INTRAMUSCULAR | Status: DC | PRN
  Administered 2011-08-26 (×4): 0.5 mg via INTRAVENOUS
  Filled 2011-08-26 (×4): qty 1

## 2011-08-26 MED ORDER — ONDANSETRON HCL 4 MG/2ML IJ SOLN
4.0000 mg | INTRAMUSCULAR | Status: DC | PRN
  Administered 2011-08-26: 4 mg via INTRAVENOUS
  Filled 2011-08-26: qty 2

## 2011-08-26 MED ORDER — METRONIDAZOLE 500 MG PO TABS
500.0000 mg | ORAL_TABLET | Freq: Three times a day (TID) | ORAL | Status: DC
  Administered 2011-08-26 – 2011-08-30 (×13): 500 mg via ORAL
  Filled 2011-08-26 (×15): qty 1

## 2011-08-26 MED ORDER — PANTOPRAZOLE SODIUM 40 MG PO TBEC
40.0000 mg | DELAYED_RELEASE_TABLET | Freq: Every day | ORAL | Status: DC
  Administered 2011-08-26 – 2011-08-29 (×4): 40 mg via ORAL
  Filled 2011-08-26 (×5): qty 1

## 2011-08-26 MED ORDER — ACETAMINOPHEN 325 MG PO TABS
650.0000 mg | ORAL_TABLET | Freq: Four times a day (QID) | ORAL | Status: DC | PRN
  Administered 2011-08-28: 650 mg via ORAL
  Filled 2011-08-26: qty 2

## 2011-08-26 MED ORDER — ONDANSETRON HCL 4 MG/2ML IJ SOLN: 4.0000 mg | Freq: Four times a day (QID) | INTRAMUSCULAR | Status: DC | PRN

## 2011-08-26 NOTE — Progress Notes (Signed)
Utilization review completed.  

## 2011-08-26 NOTE — Consult Note (Signed)
[facility] Consultation  Referring Provider: [facility] Primary Care Physician:  Per Patient No Pcp Primary Gastroenterologist:   none Reason for Consultation:  Ileitis on CTscan  HPI: Amanda Garner is a 41 y.o. female admitted yesterday with nausea, vomiting, diarrhea and abdominal pain in setting of CTscan revealing enteritis / ileitis. Patient gives a history of chronic, diffuse, postprandial abdominal burning. Yesterday she developed acute, severe mid abdominal pain, especially periumbilical pain. For a month she has been having non-bloody diarrhea. Weight fluctuates, overall stable. No recent antibiotics. No skin rashes. She has frequent neck and shoulder pain but otherwise no joint pain. Other GI history includes frequent pyrosis for which she takes Tums. She also has a history of sigmoid diverticulosis. Patient has never had a colonoscopy / seen a gastroenterologist. No [place] of IBD.    Past Medical History  Diagnosis Date  . Diverticulitis   . Blood transfusion   . Anemia     Past Surgical History  Procedure Date  . Cesarean section [id number]; 1997; 2001    Prior to Admission medications   Not on File    Current Facility-Administered Medications  Medication Dose Route Frequency Provider Last Rate Last Dose  . 0.9 %  sodium chloride infusion   Intravenous Continuous Arshad N. Kakrakandy 150 mL/hr at 08/26/11 0801    . acetaminophen (TYLENOL) tablet 650 mg  650 mg Oral Q6H PRN Rise Patience       Or  . acetaminophen (TYLENOL) suppository 650 mg  650 mg Rectal Q6H PRN Rise Patience      . antiseptic oral rinse (BIOTENE) solution 15 mL  15 mL Mouth Rinse q12n4p Arshad N. Kakrakandy   15 mL at 08/26/11 1211  . chlorhexidine (PERIDEX) 0.12 % solution 15 mL  15 mL Mouth Rinse BID Arshad N. Kakrakandy   15 mL at 08/26/11 0801  . HYDROmorphone (DILAUDID) injection 0.5 mg  0.5 mg Intravenous Q4H PRN Doreatha Lew. Kakrakandy   0.5 mg at 08/26/11 1208  .  HYDROmorphone (DILAUDID) injection 1 mg  1 mg Intravenous Once [employer]   1 mg at 08/25/11 1834  . HYDROmorphone (DILAUDID) injection 1 mg  1 mg Intravenous Once [employer]   1 mg at 08/25/11 1857  . HYDROmorphone (DILAUDID) injection 1 mg  1 mg Intravenous Once [employer]   1 mg at 08/25/11 2008  . HYDROmorphone (DILAUDID) injection 1 mg  1 mg Intravenous Once [place]   1 mg at 08/25/11 2150  . HYDROmorphone (DILAUDID) injection 1 mg  1 mg Intravenous Once [place]   1 mg at 08/26/11 0014  . iohexol (OMNIPAQUE) 300 MG/ML solution 100 mL  100 mL Intravenous Once PRN Medication Radiologist   100 mL at 08/25/11 2107  . iohexol (OMNIPAQUE) 300 MG/ML solution 20 mL  20 mL Oral Q1 Hr x 2 Medication Radiologist   20 mL at 08/25/11 2107  . metroNIDAZOLE (FLAGYL) tablet 500 mg  500 mg Oral Q8H Adeline C Viyuoh   500 mg at 08/26/11 1211  . ondansetron (ZOFRAN) injection 4 mg  4 mg Intravenous Once [employer]   4 mg at 08/25/11 1833  . ondansetron (ZOFRAN) injection 4 mg  4 mg Intravenous Once [employer]   4 mg at 08/25/11 2009  . ondansetron (ZOFRAN) injection 4 mg  4 mg Intravenous Once [place], [place]   4 mg at 08/25/11 2150  . ondansetron (ZOFRAN) injection 4 mg  4 mg Intravenous Q4H  PRN Rise [employer]ience   4 mg at 08/26/11 0145  . ondansetron (ZOFRAN) tablet 4 mg  4 mg Oral Q6H PRN Rise Patience      . saccharomyces boulardii (FLORASTOR) capsule 250 mg  250 mg Oral BID Adeline C Viyuoh      . sodium chloride 0.9 % bolus 1,000 mL  1,000 mL Intravenous Once [employer]   1,000 mL at 08/25/11 1832  . sodium chloride 0.9 % bolus 1,000 mL  1,000 mL Intravenous Once [employer]   1,000 mL at 08/25/11 1832  . white petrolatum (VASELINE) gel           . DISCONTD: ondansetron (ZOFRAN) injection 4 mg  4 mg Intravenous Q6H PRN Arshad N. Kakrakandy        Allergies as of 08/25/2011  . (No Known Allergies)    Family History  Problem Relation Age of  Onset  . Diabetes type II Mother   . Diabetes type II Father   . Stroke Father     History   Social History  . Marital Status: Single    Spouse Name: N/A    Number of Children: N/A  . Years of Education: N/A   Occupational History  . Not on file.   Social History Main Topics  . Smoking status: Former Smoker -- 0.5 packs/day for 15 years    Types: Cigarettes    Quit date: 08/24/2011  . Smokeless tobacco: Never Used  . Alcohol Use: No  . Drug Use: No  . Sexually Active: Yes    Review of Systems: All systems reviewed and negative except where noted in HPI  PHYSICAL EXAM: Vital signs in last 24 hours: Temp:  [98 F (36.7 C)-98.3 F (36.8 C)] 98.3 F (36.8 C) (01/03 1416) Pulse Rate:  [69-83] 75  (01/03 1416) Resp:  [16-20] 20  (01/03 1416) BP: (122-140)/(74-94) 140/84 mmHg (01/03 1416) SpO2:  [93 %-99 %] 95 % (01/03 1416) Weight:  [85 kg (187 lb 6.3 oz)] 187 lb 6.3 oz (85 kg) (01/03 0120) Last BM Date: 08/26/11 General:  Well-developed, black female in NAD Head:  Normocephalic and atraumatic. Eyes:   No icterus.   Conjunctiva pink. Ears:  Normal auditory acuity. Neck:  Supple; no masses felt Lungs:  Respirations even and unlabored. Lungs clear to auscultation bilaterlly.   No wheezes, crackles, or rhonchi.  Heart:  Regular rate and rhythm; no murmurs heard. Abdomen:  Soft, nondistended, mild-moderate mid abdominal and RLQ tenderness. Normal bowel sounds. No appreciable masses or hepatomegaly.  Rectal:  Not performed.  Msk:  Symmetrical without gross deformities.  Extremities:  Without edema. Neurologic:  Alert and  oriented x4;  grossly normal neurologically. Skin:  Intact without significant lesions or rashes. Cervical Nodes:  No significant cervical adenopathy. Psych:  Alert and cooperative. Normal mood and affect.  LAB RESULTS:  Basename 08/25/11 1823  WBC 7.2  HGB 14.0  HCT 40.4  PLT 184   BMET  Basename 08/25/11 1823  NA 135  K 3.4*  CL 103    CO2 20  GLUCOSE 100*  BUN 8  CREATININE 0.64  CALCIUM 9.3   LFT  Basename 08/25/11 1823  PROT 7.4  ALBUMIN 3.6  AST 15  ALT 9  ALKPHOS 57  BILITOT 0.4  BILIDIR --  IBILI --   PT/INR No results found for this basename: LABPROT:2,INR:2 in the last 72 hours  STUDIES: Ct Abdomen Pelvis W Contrast  08/25/2011  *RADIOLOGY REPORT*  Clinical Data:  Left lower quadrant abdominal pain, vomiting, diarrhea  CT ABDOMEN AND PELVIS WITH CONTRAST  Technique:  Multidetector CT imaging of the abdomen and pelvis was performed following the standard protocol during bolus administration of intravenous contrast.  Contrast: 16m OMNIPAQUE IOHEXOL 300 MG/ML IV SOLN, 1039mOMNIPAQUE IOHEXOL 300 MG/ML IV SOLN  Comparison: 02/12/2009  Findings: Mild dependent changes in the lung bases.  Small esophageal hiatal hernia with suggestion of mild thickening of the lower esophageal wall which might represent reflux disease.  The liver, spleen, gallbladder, pancreas, adrenal glands, abdominal aorta, and retroperitoneal lymph nodes are unremarkable.  Mild atrophy or scarring in the lower pole of the right kidney.  No solid mass or hydronephrosis demonstrated in the kidneys.  No gastric wall thickening.    Small broad-based umbilical hernia containing fat.  Adjacent circumscribed fat lobule in the subcutaneous soft tissues suggesting either lipoma or small fat containing hernia.  No free air in the abdomen.  Pelvis:  There is diffuse wall thickening and edema involving multiple loops of small bowel in the lower abdomen and pelvis. There is surrounding inflammatory stranding in the mesenteric fat. Small free fluid collections in the pericolic gutters and extending down into the pelvis. Density measurements of the fluid are intermediate, likely representing ascites. Hemorrhage not entirely excluded.  The colon is decompressed and predominately stool filled although there is a suggestion of possible thickening of the wall of the  cecal region.  Scattered diverticula.  The appearance is consistent with enteritis and may be due to inflammatory/infectious cause, Crohn disease, or ischemia.  Flow is demonstrated in the superior mesenteric artery and veins, arguing against ischemia.  No free air or pneumatosis.  No proximal small bowel dilatation.  Normal alignment of the lumbar vertebrae. Uterus and adnexal structures are not enlarged.  Diverticulosis of the sigmoid colon without inflammatory change.  Appendix is not visualized. Mesenteric lymph nodes are moderately prominent but not pathologically enlarged consistent with reactive change.  IMPRESSION: Diffuse wall thickening and edema of the distal small bowel per involving ileum with mild dilatation and inflammatory stranding in the mesentery.  Small amount of fluid in the pelvis and mesentery. Possible involvement of the cecum.  Inflammatory or reactive lymph nodes in the mesentery.  Changes suggest enteritis and may represent Crohn disease or infectious/inflammatory cause.  Ischemia is less likely.  Results discussed with Dr. PaLarose Kellst the time of dictation, 2116 hours on 08/25/2011  Original Report Authenticated By: WINeale BurlyM.D.   PREVIOUS ENDOSCOPIES: none  IMPRESSION / PLAN: 1. Acute mid abdominal pain / subacute diarrhea. CTscan reveals diffuse wall thickening and edema of the distal small bowel involving the ileum with mild dilatation and inflammatory stranding in the mesentery. Possible involvement of the cecum. Patient is  positive for C-Diff for which she doesn't have any of the more obvious risk factors. Given history of chronic post-prandial abdominal pain and CT scan findings one has to consider IBD with superimposed C-Diff. Agree with PO Flagyl, analgesics. Abdominal exam not overly concerning, WBC normal and she is afebrile. She should be okay with small sips of clears. Patient will need eventual colonoscopy with intubation of terminal ileum.    2.  Chronic  postprandial abdominal burning (diffuse).   See #1. 3.  GERD, untreated. Takes Tums on regular basis. CTscan shows some mild thickening of lower esophagus. Findings should be from esophagitis, hiatal hernia can have that appearance. Will plan for EGD at time of colonoscopy. Start daily PPI.  3. Microcytosis,  hemoglobin 14.0 yesterday which was probably falsely elevated from dehydration. She was 12.5 in November 2010. Menstrual cycles are sporadic. She has polychromasia, large platelets, ? Underlying hematological problem. Will check iron studies today. Recheck CBC in am. 4. History of documented sigmoid diverticulosis 2010  Thanks   LOS: 1 day   Tye Savoy  08/26/2011, 3:01 PM    Patient seen, examined and I agree with the above documentation, including the assessment and plan. Pt with abd tenderness, BS are positive, but abd is distended.   C diff treatment started today Will check KUB 2 view now Increase dilaudid slightly to 1 mg q4 hours prn, we discussed wanting to control her pain but also wanting to avoid too much narcotic pain control which can impair GI motility further and complicate the picture Agree with PPI Will likely need colon once c diff resolves to r/o IBD, and also EGD given GERD symptoms and thickening seen by CT  Lajuan Lines. Vaudie Engebretsen, M.D.  08/26/2011

## 2011-08-26 NOTE — Progress Notes (Signed)
Subjective: Diarrhea x3 this a.m., still with associated abdominal pain. Objective: Vital signs in last 24 hours: Temp:  [98 F (36.7 C)-99.1 F (37.3 C)] 98.2 F (36.8 C) (01/03 0611) Pulse Rate:  [69-135] 72  (01/03 0611) Resp:  [16-24] 18  (01/03 0611) BP: (122-151)/(74-101) 122/74 mmHg (01/03 0611) SpO2:  [93 %-99 %] 97 % (01/03 0611) Weight:  [83.915 kg (185 lb)-85 kg (187 lb 6.3 oz)] 187 lb 6.3 oz (85 kg) (01/03 0120) Last BM Date: 08/26/11 Intake/Output from previous day: 01/02 0701 - 01/03 0700 In: 706.4 [I.V.:706.4] Out: 250 [Urine:250] Intake/Output this shift: Total I/O In: -  Out: 450 [Urine:450]    General Appearance:    Alert, cooperative, no distress, appears stated age  Lungs:     Clear to auscultation bilaterally, respirations unlabored   Heart:    Regular rate and rhythm, S1 and S2 normal, no murmur, rub   or gallop  Abdomen:     Soft, left-sided tenderness present, no rebound,bowel sounds active all four quadrants,    no masses, no organomegaly  Extremities:   Extremities normal, atraumatic, no cyanosis or edema  Neurologic:   CNII-XII intact, normal strength, sensation and reflexes    throughout    Weight change:   Intake/Output Summary (Last 24 hours) at 08/26/11 1027 Last data filed at 08/26/11 0900  Gross per 24 hour  Intake  706.4 ml  Output    700 ml  Net    6.4 ml    Lab Results:   [facility] 08/25/11 1823  NA 135  K 3.4*  CL 103  CO2 20  GLUCOSE 100*  BUN 8  CREATININE 0.64  CALCIUM 9.3    Basename 08/25/11 1823  WBC 7.2  HGB 14.0  HCT 40.4  PLT 184  MCV 66.8*   PT/INR No results found for this basename: LABPROT:2,INR:2 in the last 72 hours ABG No results found for this basename: PHART:2,PCO2:2,PO2:2,HCO3:2 in the last 72 hours  Micro Results: Recent Results (from the past 240 hour(s))  CLOSTRIDIUM DIFFICILE BY PCR     Status: Abnormal   Collection Time   08/26/11  4:08 AM      Component Value Range Status Comment   C difficile by pcr POSITIVE (*) NEGATIVE  Final    Studies/Results: Ct Abdomen Pelvis W Contrast  08/25/2011  *RADIOLOGY REPORT*  Clinical Data: Left lower quadrant abdominal pain, vomiting, diarrhea  CT ABDOMEN AND PELVIS WITH CONTRAST  Technique:  Multidetector CT imaging of the abdomen and pelvis was performed following the standard protocol during bolus administration of intravenous contrast.  Contrast: 43m OMNIPAQUE IOHEXOL 300 MG/ML IV SOLN, 109mOMNIPAQUE IOHEXOL 300 MG/ML IV SOLN  Comparison: 02/12/2009  Findings: Mild dependent changes in the lung bases.  Small esophageal hiatal hernia with suggestion of mild thickening of the lower esophageal wall which might represent reflux disease.  The liver, spleen, gallbladder, pancreas, adrenal glands, abdominal aorta, and retroperitoneal lymph nodes are unremarkable.  Mild atrophy or scarring in the lower pole of the right kidney.  No solid mass or hydronephrosis demonstrated in the kidneys.  No gastric wall thickening.    Small broad-based umbilical hernia containing fat.  Adjacent circumscribed fat lobule in the subcutaneous soft tissues suggesting either lipoma or small fat containing hernia.  No free air in the abdomen.  Pelvis:  There is diffuse wall thickening and edema involving multiple loops of small bowel in the lower abdomen and pelvis. There is surrounding inflammatory stranding in the mesenteric fat.  Small free fluid collections in the pericolic gutters and extending down into the pelvis. Density measurements of the fluid are intermediate, likely representing ascites. Hemorrhage not entirely excluded.  The colon is decompressed and predominately stool filled although there is a suggestion of possible thickening of the wall of the cecal region.  Scattered diverticula.  The appearance is consistent with enteritis and may be due to inflammatory/infectious cause, Crohn disease, or ischemia.  Flow is demonstrated in the superior mesenteric artery and  veins, arguing against ischemia.  No free air or pneumatosis.  No proximal small bowel dilatation.  Normal alignment of the lumbar vertebrae. Uterus and adnexal structures are not enlarged.  Diverticulosis of the sigmoid colon without inflammatory change.  Appendix is not visualized. Mesenteric lymph nodes are moderately prominent but not pathologically enlarged consistent with reactive change.  IMPRESSION: Diffuse wall thickening and edema of the distal small bowel per involving ileum with mild dilatation and inflammatory stranding in the mesentery.  Small amount of fluid in the pelvis and mesentery. Possible involvement of the cecum.  Inflammatory or reactive lymph nodes in the mesentery.  Changes suggest enteritis and may represent Crohn disease or infectious/inflammatory cause.  Ischemia is less likely.  Results discussed with Dr. Larose Kells at the time of dictation, 2116 hours on 08/25/2011  Original Report Authenticated By: Neale Burly, M.D.   Medications: { Scheduled Meds:   . antiseptic oral rinse  15 mL Mouth Rinse q12n4p  . chlorhexidine  15 mL Mouth Rinse BID  .  HYDROmorphone (DILAUDID) injection  1 mg Intravenous Once  .  HYDROmorphone (DILAUDID) injection  1 mg Intravenous Once  .  HYDROmorphone (DILAUDID) injection  1 mg Intravenous Once  .  HYDROmorphone (DILAUDID) injection  1 mg Intravenous Once  .  HYDROmorphone (DILAUDID) injection  1 mg Intravenous Once  . iohexol  20 mL Oral Q1 Hr x 2  . metroNIDAZOLE  500 mg Oral Q8H  . ondansetron (ZOFRAN) IV  4 mg Intravenous Once  . ondansetron (ZOFRAN) IV  4 mg Intravenous Once  . ondansetron (ZOFRAN) IV  4 mg Intravenous Once  . sodium chloride  1,000 mL Intravenous Once  . sodium chloride  1,000 mL Intravenous Once  . white petrolatum       Continuous Infusions:   . sodium chloride 150 mL/hr at 08/26/11 0801   PRN Meds:.acetaminophen, acetaminophen, HYDROmorphone, iohexol, ondansetron, ondansetron, DISCONTD: ondansetron (ZOFRAN)  IV Assessment/Plan: Patient Active Hospital Problem List: Enteritis (08/25/2011)/C. Difficile diarrhea -Start oral fagyl, will add florastor. -I have consulted GI given CT scan with thickening and edema of distal small bowel involving the ileum due to concern about possible Crohn's disease/IBD Hypokalemia- replace k, follow and recheck.      LOS: 1 day   Chrisie Jankovich C 08/26/2011, 10:27 AM

## 2011-08-27 DIAGNOSIS — K5289 Other specified noninfective gastroenteritis and colitis: Secondary | ICD-10-CM

## 2011-08-27 LAB — BASIC METABOLIC PANEL
BUN: <3 mg/dL — ABNORMAL LOW (ref 6–23)
Calcium: 7.9 mg/dL — ABNORMAL LOW (ref 8.4–10.5)
GFR calc Af Amer: >90 mL/min (ref >90)
GFR calc non Af Amer: >90 mL/min (ref >90)
Glucose, Bld: 87 mg/dL (ref 70–99)
Potassium: 2.9 mEq/L — ABNORMAL LOW (ref 3.5–5.1)
Sodium: 138 mEq/L (ref 135–145)

## 2011-08-27 LAB — GLUCOSE, CAPILLARY
Glucose-Capillary: 83 mg/dL (ref 70–99)
Glucose-Capillary: 91 mg/dL (ref 70–99)

## 2011-08-27 LAB — CBC
Hemoglobin: 11.1 g/dL — ABNORMAL LOW (ref 12.0–15.0)
MCH: 22.4 pg — ABNORMAL LOW (ref 26.0–34.0)
MCHC: 33.4 g/dL (ref 30.0–36.0)
Platelets: 173 10*3/uL (ref 150–400)

## 2011-08-27 MED ORDER — SIMETHICONE 80 MG PO CHEW
80.0000 mg | CHEWABLE_TABLET | Freq: Four times a day (QID) | ORAL | Status: DC | PRN
  Administered 2011-08-27: 80 mg via ORAL
  Filled 2011-08-27 (×2): qty 1

## 2011-08-27 MED ORDER — HYDROMORPHONE HCL PF 1 MG/ML IJ SOLN
0.5000 mg | INTRAMUSCULAR | Status: DC | PRN
  Administered 2011-08-27 – 2011-08-28 (×3): 1 mg via INTRAVENOUS
  Administered 2011-08-28: 0.5 mg via INTRAVENOUS
  Administered 2011-08-28 – 2011-08-29 (×3): 1 mg via INTRAVENOUS
  Filled 2011-08-27 (×7): qty 1

## 2011-08-27 MED ORDER — POTASSIUM CHLORIDE CRYS ER 20 MEQ PO TBCR
40.0000 meq | EXTENDED_RELEASE_TABLET | Freq: Once | ORAL | Status: AC
  Administered 2011-08-27: 40 meq via ORAL
  Filled 2011-08-27: qty 2

## 2011-08-27 NOTE — Progress Notes (Signed)
Patient seen and I agree with the above documentation, including the assessment and plan. She is better this evening than last If she improves fully, then colon can be as outpt If improvement isn't complete and pain/diarrhea persist despite abx treatment, then would consider inpt colonoscopy to r/o IBD Lytes replacement per Primary team

## 2011-08-27 NOTE — Progress Notes (Signed)
CBG: 64  Treatment: Dinner tray in room  Symptoms: None  Follow-up CBG: Time: 1825 CBG Result: 91  Possible Reasons for Event: Unknown  Comments/MD notified:     Yvonna Alanis

## 2011-08-27 NOTE — Progress Notes (Signed)
[facility] Progress Note  SUBJECTIVE: No stools since 10pm. Pain was better until she got up to bathe this morning then had recurrent pain, this time more in the LLQ. No nausea, wants clear liquids, "someone took them away"  OBJECTIVE:  Vital signs in last 24 hours: Temp:  [98.1 F (36.7 C)-98.5 F (36.9 C)] 98.5 F (36.9 C) (01/04 0612) Pulse Rate:  [73-78] 73  (01/04 0612) Resp:  [17-20] 20  (01/04 0612) BP: (129-140)/(84-96) 132/96 mmHg (01/04 0612) SpO2:  [95 %-98 %] 97 % (01/04 0612) Last BM Date: 08/26/11 General:    Pleasant black female in NAD Heart:  Regular rate and rhythm; no murmurs Abdomen:  Soft, mildly distended, mild periumbilical and LLQ tenderness. Normal bowel sounds. Extremities:  Without edema. Neurologic:  Alert and oriented,  grossly normal neurologically. Psych:  Cooperative. Normal mood and affect.  Lab Results:  Basename 08/27/11 0500 08/25/11 1823  WBC 5.3 7.2  HGB 11.1* 14.0  HCT 33.2* 40.4  PLT 173 184   BMET  Basename 08/27/11 0500 08/25/11 1823  NA 138 135  K 2.9* 3.4*  CL 106 103  CO2 24 20  GLUCOSE 87 100*  BUN <3* 8  CREATININE 0.61 0.64  CALCIUM 7.9* 9.3   LFT  Basename 08/25/11 1823  PROT 7.4  ALBUMIN 3.6  AST 15  ALT 9  ALKPHOS 82  BILITOT 0.4  BILIDIR --  IBILI --    Studies/Results: Ct Abdomen Pelvis W Contrast  08/25/2011  *RADIOLOGY REPORT*  Clinical Data: Left lower quadrant abdominal pain, vomiting, diarrhea  CT ABDOMEN AND PELVIS WITH CONTRAST  Technique:  Multidetector CT imaging of the abdomen and pelvis was performed following the standard protocol during bolus administration of intravenous contrast.  Contrast: 93m OMNIPAQUE IOHEXOL 300 MG/ML IV SOLN, 1077mOMNIPAQUE IOHEXOL 300 MG/ML IV SOLN  Comparison: 02/12/2009  Findings: Mild dependent changes in the lung bases.  Small esophageal hiatal hernia with suggestion of mild thickening of the lower esophageal wall which might represent reflux disease.   The liver, spleen, gallbladder, pancreas, adrenal glands, abdominal aorta, and retroperitoneal lymph nodes are unremarkable.  Mild atrophy or scarring in the lower pole of the right kidney.  No solid mass or hydronephrosis demonstrated in the kidneys.  No gastric wall thickening.    Small broad-based umbilical hernia containing fat.  Adjacent circumscribed fat lobule in the subcutaneous soft tissues suggesting either lipoma or small fat containing hernia.  No free air in the abdomen.  Pelvis:  There is diffuse wall thickening and edema involving multiple loops of small bowel in the lower abdomen and pelvis. There is surrounding inflammatory stranding in the mesenteric fat. Small free fluid collections in the pericolic gutters and extending down into the pelvis. Density measurements of the fluid are intermediate, likely representing ascites. Hemorrhage not entirely excluded.  The colon is decompressed and predominately stool filled although there is a suggestion of possible thickening of the wall of the cecal region.  Scattered diverticula.  The appearance is consistent with enteritis and may be due to inflammatory/infectious cause, Crohn disease, or ischemia.  Flow is demonstrated in the superior mesenteric artery and veins, arguing against ischemia.  No free air or pneumatosis.  No proximal small bowel dilatation.  Normal alignment of the lumbar vertebrae. Uterus and adnexal structures are not enlarged.  Diverticulosis of the sigmoid colon without inflammatory change.  Appendix is not visualized. Mesenteric lymph nodes are moderately prominent but not pathologically enlarged consistent with reactive change.  IMPRESSION:  Diffuse wall thickening and edema of the distal small bowel per involving ileum with mild dilatation and inflammatory stranding in the mesentery.  Small amount of fluid in the pelvis and mesentery. Possible involvement of the cecum.  Inflammatory or reactive lymph nodes in the mesentery.  Changes  suggest enteritis and may represent Crohn disease or infectious/inflammatory cause.  Ischemia is less likely.  Results discussed with Dr. Larose Kells at the time of dictation, 2116 hours on 08/25/2011  Original Report Authenticated By: Neale Burly, M.D.   Dg Abd 2 Views  08/26/2011  *RADIOLOGY REPORT*  Clinical Data: Abdominal pain.  ABDOMEN - 2 VIEW  Comparison: 08/25/2011 CT  Findings: Mildly prominent mid abdominal small bowel loops, similar to prior CT study.  Gas and oral contrast material seen within the colon which is decompressed and grossly unremarkable.  No free air. No organomegaly or suspicious calcification.  IMPRESSION: Prominent mid abdominal small bowel loops as seen on prior CT.  No real change.  No free air.  Original Report Authenticated By: Raelyn Number, M.D.   ASSESSMENT / PLAN: 1. C-Diff - normal WBC, afebrile. Continue oral Flagyl. 2.  Ileitis, rule out Crohn's ileitis with superimposed C-Diff. She will need a colonoscopy, preferably after resolution of C-Diff. Hopefully she will continue to improve but if not then we will need to consider switching her to oral Vanco (if having a lot of diarrhea). Beyond that we will need to consider that this is more IBD than C-diff related and consider starting steroids. Obviously steroids not ideal in setting of C-Diff infection but we may still have to go that route 3. Hypokalemia, repletion per primary team.   LOS: 2 days   Tye Savoy  08/27/2011, 10:46 AM

## 2011-08-27 NOTE — Progress Notes (Signed)
Patient ID: Amanda Garner, female   DOB: [birth year], 41 y.o.   MRN: [id number] Subjective: No events overnight. Patient denies chest pain, shortness of breath. Patient reports abdominal pain 7/10 in intensity. No diarrhea or vomiting. Objective:  Vital signs in last 24 hours:  Filed Vitals:   08/26/11 1416 08/26/11 2138 08/27/11 0612 08/27/11 1505  BP: 140/84 129/92 132/96 131/85  Pulse: 75 78 73 72  Temp: 98.3 F (36.8 C) 98.1 F (36.7 C) 98.5 F (36.9 C) 97.7 F (36.5 C)  TempSrc:   Oral   Resp: 20 17 20 18   Height:      Weight:      SpO2: 95% 98% 97% 95%    Intake/Output from previous day:   Intake/Output Summary (Last 24 hours) at 08/27/11 1609 Last data filed at 08/27/11 0600  Gross per 24 hour  Intake   1200 ml  Output      7 ml  Net   1193 ml    Physical Exam: General: Alert, awake, oriented x3, in no acute distress. HEENT: No bruits, no goiter. Moist mucous membranes, no scleral icterus, no conjunctival pallor. Heart: Regular rate and rhythm, S1/S2 +, no murmurs, rubs, gallops. Lungs: Clear to auscultation bilaterally. No wheezing, no rhonchi, no rales.  Abdomen: Soft, nontender, nondistended, positive bowel sounds. Extremities: No clubbing or cyanosis, no pitting edema,  positive pedal pulses. Neuro: Grossly nonfocal.  Lab Results:  Basic Metabolic Panel:    Component Value Date/Time   NA 138 08/27/2011 0500   K 2.9* 08/27/2011 0500   CL 106 08/27/2011 0500   CO2 24 08/27/2011 0500   BUN <3* 08/27/2011 0500   CREATININE 0.61 08/27/2011 0500   GLUCOSE 87 08/27/2011 0500   CALCIUM 7.9* 08/27/2011 0500   CBC:    Component Value Date/Time   WBC 5.3 08/27/2011 0500   HGB 11.1* 08/27/2011 0500   HCT 33.2* 08/27/2011 0500   PLT 173 08/27/2011 0500   MCV 66.9* 08/27/2011 0500   NEUTROABS 5.3 08/25/2011 1823   LYMPHSABS 1.3 08/25/2011 1823   MONOABS 0.5 08/25/2011 1823   EOSABS 0.1 08/25/2011 1823   BASOSABS 0.0 08/25/2011 1823      Lab 08/27/11 0500 08/25/11 1823  WBC 5.3  7.2  HGB 11.1* 14.0  HCT 33.2* 40.4  PLT 173 184  MCV 66.9* 66.8*  MCH 22.4* 23.1*  MCHC 33.4 34.7  RDW 14.6 14.7  LYMPHSABS -- 1.3  MONOABS -- 0.5  EOSABS -- 0.1  BASOSABS -- 0.0  BANDABS -- --    Lab 08/27/11 0500 08/25/11 1823  NA 138 135  K 2.9* 3.4*  CL 106 103  CO2 24 20  GLUCOSE 87 100*  BUN <3* 8  CREATININE 0.61 0.64  CALCIUM 7.9* 9.3  MG -- --   No results found for this basename: INR:5,PROTIME:5 in the last 168 hours Cardiac markers: No results found for this basename: CK:3,CKMB:3,TROPONINI:3,MYOGLOBIN:3 in the last 168 hours No components found with this basename: POCBNP:3 Recent Results (from the past 240 hour(s))  CLOSTRIDIUM DIFFICILE BY PCR     Status: Abnormal   Collection Time   08/26/11  4:08 AM      Component Value Range Status Comment   C difficile by pcr POSITIVE (*) NEGATIVE  Final   STOOL CULTURE     Status: Normal (Preliminary result)   Collection Time   08/26/11  4:08 AM      Component Value Range Status Comment   Specimen Description STOOL  Final    Special Requests NONE   Final    Culture Culture reincubated for better growth   Final    Report Status PENDING   Incomplete     Studies/Results: Ct Abdomen Pelvis W Contrast  08/25/2011  *RADIOLOGY REPORT*  Clinical Data: Left lower quadrant abdominal pain, vomiting, diarrhea  CT ABDOMEN AND PELVIS WITH CONTRAST  Technique:  Multidetector CT imaging of the abdomen and pelvis was performed following the standard protocol during bolus administration of intravenous contrast.  Contrast: 86m OMNIPAQUE IOHEXOL 300 MG/ML IV SOLN, 1056mOMNIPAQUE IOHEXOL 300 MG/ML IV SOLN  Comparison: 02/12/2009  Findings: Mild dependent changes in the lung bases.  Small esophageal hiatal hernia with suggestion of mild thickening of the lower esophageal wall which might represent reflux disease.  The liver, spleen, gallbladder, pancreas, adrenal glands, abdominal aorta, and retroperitoneal lymph nodes are unremarkable.  Mild  atrophy or scarring in the lower pole of the right kidney.  No solid mass or hydronephrosis demonstrated in the kidneys.  No gastric wall thickening.    Small broad-based umbilical hernia containing fat.  Adjacent circumscribed fat lobule in the subcutaneous soft tissues suggesting either lipoma or small fat containing hernia.  No free air in the abdomen.  Pelvis:  There is diffuse wall thickening and edema involving multiple loops of small bowel in the lower abdomen and pelvis. There is surrounding inflammatory stranding in the mesenteric fat. Small free fluid collections in the pericolic gutters and extending down into the pelvis. Density measurements of the fluid are intermediate, likely representing ascites. Hemorrhage not entirely excluded.  The colon is decompressed and predominately stool filled although there is a suggestion of possible thickening of the wall of the cecal region.  Scattered diverticula.  The appearance is consistent with enteritis and may be due to inflammatory/infectious cause, Crohn disease, or ischemia.  Flow is demonstrated in the superior mesenteric artery and veins, arguing against ischemia.  No free air or pneumatosis.  No proximal small bowel dilatation.  Normal alignment of the lumbar vertebrae. Uterus and adnexal structures are not enlarged.  Diverticulosis of the sigmoid colon without inflammatory change.  Appendix is not visualized. Mesenteric lymph nodes are moderately prominent but not pathologically enlarged consistent with reactive change.  IMPRESSION: Diffuse wall thickening and edema of the distal small bowel per involving ileum with mild dilatation and inflammatory stranding in the mesentery.  Small amount of fluid in the pelvis and mesentery. Possible involvement of the cecum.  Inflammatory or reactive lymph nodes in the mesentery.  Changes suggest enteritis and may represent Crohn disease or infectious/inflammatory cause.  Ischemia is less likely.  Results discussed with  Dr. PaLarose Kellst the time of dictation, 2116 hours on 08/25/2011  Original Report Authenticated By: WINeale BurlyM.D.   Dg Abd 2 Views  08/26/2011  *RADIOLOGY REPORT*  Clinical Data: Abdominal pain.  ABDOMEN - 2 VIEW  Comparison: 08/25/2011 CT  Findings: Mildly prominent mid abdominal small bowel loops, similar to prior CT study.  Gas and oral contrast material seen within the colon which is decompressed and grossly unremarkable.  No free air. No organomegaly or suspicious calcification.  IMPRESSION: Prominent mid abdominal small bowel loops as seen on prior CT.  No real change.  No free air.  Original Report Authenticated By: KERaelyn NumberM.D.    Medications: Scheduled Meds:   . antiseptic oral rinse  15 mL Mouth Rinse q12n4p  . chlorhexidine  15 mL Mouth Rinse BID  . metroNIDAZOLE  500 mg Oral Q8H  . pantoprazole  40 mg Oral Q0600  . saccharomyces boulardii  250 mg Oral BID   Continuous Infusions:   . sodium chloride 150 mL/hr at 08/27/11 1046   PRN Meds:.acetaminophen, acetaminophen, HYDROmorphone, ondansetron, ondansetron, simethicone, DISCONTD: HYDROmorphone, DISCONTD: HYDROmorphone  Assessment/Plan:  Principal Problem:   *Enteritis - patient continue to have abdominal pain so analgesia increased to Q 3 hours PRN (Dilaudid) - patient insisted she felt she can start regular diet so it was advance to bland diet - NPO post midnight if there is any plan for colonoscopy in am - continue flagyl for now  Active Problems:   C. difficile colitis - continue flagyl 500 mg PO Q 8 Hours - patient reports no diarrhea at present  Hypokalemia - likely secondary to previous diarrhea and vomiting - diarrhea and vomiting subsided at present - will continue to monitor - repleted   EDUCATION - test results and diagnostic studies were discussed with patient and pt's family who was present at the bedside - patient and family have verbalized the understanding - questions were answered  at the bedside and contact information was provided for additional questions or concerns   LOS: 2 days   Karlye Ihrig 08/27/2011, 4:09 PM  [facility] Pager: [phone]

## 2011-08-28 LAB — GLUCOSE, CAPILLARY
Glucose-Capillary: 100 mg/dL — ABNORMAL HIGH (ref 70–99)
Glucose-Capillary: 122 mg/dL — ABNORMAL HIGH (ref 70–99)
Glucose-Capillary: 123 mg/dL — ABNORMAL HIGH (ref 70–99)

## 2011-08-28 MED ORDER — POTASSIUM CHLORIDE CRYS ER 20 MEQ PO TBCR
40.0000 meq | EXTENDED_RELEASE_TABLET | Freq: Once | ORAL | Status: AC
  Administered 2011-08-28: 40 meq via ORAL
  Filled 2011-08-28: qty 2

## 2011-08-28 MED ORDER — ALUM & MAG HYDROXIDE-SIMETH 200-200-20 MG/5ML PO SUSP
30.0000 mL | Freq: Four times a day (QID) | ORAL | Status: DC | PRN
  Administered 2011-08-28: 30 mL via ORAL
  Filled 2011-08-28: qty 30

## 2011-08-28 NOTE — Progress Notes (Signed)
[facility] Progress Note  Subjective: Pt states she had "set back" this am after drinking OJ.  This caused significant burning back in the mid-abd.  Now better after pain meds.  Passing flatus, no stool since Thursday at 2200.  No n/v.  No fever.  Tolerating bland diet otherwise. Overall, prior to drinking OJ this morning she said she felt def better overall.  Objective:  Vital signs in last 24 hours: Temp:  [97.7 F (36.5 C)-98.1 F (36.7 C)] 98.1 F (36.7 C) (01/04 2147) Pulse Rate:  [67-72] 67  (01/04 2147) Resp:  [18] 18  (01/04 2147) BP: (131)/(85-91) 131/91 mmHg (01/04 2147) SpO2:  [95 %-96 %] 96 % (01/04 2147) Last BM Date: August 29, 2011 General:   Alert,  Well-developed,  NAD sitting up in bed Heart:  Regular rate and rhythm; no murmurs Chest: CTA b/l Abdomen:  Soft, mildly distended, tender in the mid and right abd without rebound or guarding, +BS  Extremities:  Without edema. Neurologic:  Alert and  oriented x4;  grossly normal neurologically. Psych:  Alert and cooperative. Normal mood and affect.  Intake/Output from previous day: 01/04 0701 - 01/05 0700 In: 2397 [I.V.:2397] Out: -  Intake/Output this shift:    Lab Results:  Basename 08/27/11 0500 08/25/11 1823  WBC 5.3 7.2  HGB 11.1* 14.0  HCT 33.2* 40.4  PLT 173 184   BMET  Basename 08/27/11 0500 08/25/11 1823  NA 138 135  K 2.9* 3.4*  CL 106 103  CO2 24 20  GLUCOSE 87 100*  BUN <3* 8  CREATININE 0.61 0.64  CALCIUM 7.9* 9.3   LFT  Basename 08/25/11 1823  PROT 7.4  ALBUMIN 3.6  AST 15  ALT 9  ALKPHOS 57  BILITOT 0.4  BILIDIR --  IBILI --    Studies/Results: Dg Abd 2 Views  08-29-2011  *RADIOLOGY REPORT*  Clinical Data: Abdominal pain.  ABDOMEN - 2 VIEW  Comparison: 08/25/2011 CT  Findings: Mildly prominent mid abdominal small bowel loops, similar to prior CT study.  Gas and oral contrast material seen within the colon which is decompressed and grossly unremarkable.  No free air. No  organomegaly or suspicious calcification.  IMPRESSION: Prominent mid abdominal small bowel loops as seen on prior CT.  No real change.  No free air.  Original Report Authenticated By: Amanda Garner, Garner.D.     Assessment / Plan: 1. C-Diff - normal WBC, afebrile. Continue oral Flagyl. Diarrhea has stopped at this point.  Overall she reports she feels better.  Need to complete therapy.  2. Ileitis/colitis -- difficult to know for sure, but she may have underlying IBD with superimposed c diff colitis.  Her imaging reveals TI inflammation which would be consistent with Crohn's.  Diarrhea overall has stopped.  She will need a colonoscopy at some point, but will base timing on her clinical course. If she fails to improve as expected with c diff treatment, then will perform colon while here, perhaps, Monday.  3. Hypokalemia, repletion per primary team.  4. GERD - on PPI.  Added Maalox for breakthrough indigestion.  Principal Problem:  *Enteritis Active Problems:  C. difficile colitis  Abdominal  pain, other specified site  Diarrhea  Abnormal finding on GI tract imaging     LOS: 3 days   Amanda Garner  08/28/2011, 11:29 AM

## 2011-08-28 NOTE — ED Provider Notes (Signed)
I saw and evaluated the patient, reviewed the resident's note and I agree with the findings and plan.   .Face to face Exam:  General:  Awake Resp:  Normal effort Abd:  Nondistended Neuro:No focal weakness Lymph: No adenopathy   Dot Lanes, MD 08/28/11 1322

## 2011-08-28 NOTE — Progress Notes (Signed)
Patient ID: Amanda Garner, female   DOB: [birth year], 41 y.o.   MRN: [id number] Subjective: No events overnight.  Objective:  Vital signs in last 24 hours:  Filed Vitals:   08/27/11 0612 08/27/11 1505 08/27/11 2147 08/28/11 1430  BP: 132/96 131/85 131/91 136/82  Pulse: 73 72 67 64  Temp: 98.5 F (36.9 C) 97.7 F (36.5 C) 98.1 F (36.7 C) 98.2 F (36.8 C)  TempSrc: Oral  Oral Oral  Resp: 20 18 18 18   Height:      Weight:      SpO2: 97% 95% 96% 100%    Intake/Output from previous day:   Intake/Output Summary (Last 24 hours) at 08/28/11 1751 Last data filed at 08/28/11 1518  Gross per 24 hour  Intake   4272 ml  Output      2 ml  Net   4270 ml    Physical Exam: General: Alert, awake, oriented x3, in no acute distress. HEENT: No bruits, no goiter. Moist mucous membranes, no scleral icterus, no conjunctival pallor. Heart: Regular rate and rhythm, S1/S2 +, no murmurs, rubs, gallops. Lungs: Clear to auscultation bilaterally. No wheezing, no rhonchi, no rales.  Abdomen: Soft, nontender, nondistended, positive bowel sounds. Extremities: No clubbing or cyanosis, no pitting edema,  positive pedal pulses. Neuro: Grossly nonfocal.  Lab Results:  Basic Metabolic Panel:    Component Value Date/Time   NA 138 08/27/2011 0500   K 2.9* 08/27/2011 0500   CL 106 08/27/2011 0500   CO2 24 08/27/2011 0500   BUN <3* 08/27/2011 0500   CREATININE 0.61 08/27/2011 0500   GLUCOSE 87 08/27/2011 0500   CALCIUM 7.9* 08/27/2011 0500   CBC:    Component Value Date/Time   WBC 5.3 08/27/2011 0500   HGB 11.1* 08/27/2011 0500   HCT 33.2* 08/27/2011 0500   PLT 173 08/27/2011 0500   MCV 66.9* 08/27/2011 0500   NEUTROABS 5.3 08/25/2011 1823   LYMPHSABS 1.3 08/25/2011 1823   MONOABS 0.5 08/25/2011 1823   EOSABS 0.1 08/25/2011 1823   BASOSABS 0.0 08/25/2011 1823      Lab 08/27/11 0500 08/25/11 1823  WBC 5.3 7.2  HGB 11.1* 14.0  HCT 33.2* 40.4  PLT 173 184  MCV 66.9* 66.8*  MCH 22.4* 23.1*  MCHC 33.4 34.7  RDW  14.6 14.7  LYMPHSABS -- 1.3  MONOABS -- 0.5  EOSABS -- 0.1  BASOSABS -- 0.0  BANDABS -- --    Lab 08/27/11 0500 08/25/11 1823  NA 138 135  K 2.9* 3.4*  CL 106 103  CO2 24 20  GLUCOSE 87 100*  BUN <3* 8  CREATININE 0.61 0.64  CALCIUM 7.9* 9.3  MG -- --   No results found for this basename: INR:5,PROTIME:5 in the last 168 hours Cardiac markers: No results found for this basename: CK:3,CKMB:3,TROPONINI:3,MYOGLOBIN:3 in the last 168 hours No components found with this basename: POCBNP:3 Recent Results (from the past 240 hour(s))  CLOSTRIDIUM DIFFICILE BY PCR     Status: Abnormal   Collection Time   08/26/11  4:08 AM      Component Value Range Status Comment   C difficile by pcr POSITIVE (*) NEGATIVE  Final   STOOL CULTURE     Status: Normal (Preliminary result)   Collection Time   08/26/11  4:08 AM      Component Value Range Status Comment   Specimen Description STOOL   Final    Special Requests NONE   Final    Culture NO SUSPICIOUS  COLONIES, CONTINUING TO HOLD   Final    Report Status PENDING   Incomplete     Studies/Results: Dg Abd 2 Views  09-Sep-2011  *RADIOLOGY REPORT*  Clinical Data: Abdominal pain.  ABDOMEN - 2 VIEW  Comparison: 08/25/2011 CT  Findings: Mildly prominent mid abdominal small bowel loops, similar to prior CT study.  Gas and oral contrast material seen within the colon which is decompressed and grossly unremarkable.  No free air. No organomegaly or suspicious calcification.  IMPRESSION: Prominent mid abdominal small bowel loops as seen on prior CT.  No real change.  No free air.  Original Report Authenticated By: Raelyn Number, M.D.    Medications: Scheduled Meds:   . metroNIDAZOLE  500 mg Oral Q8H  . pantoprazole  40 mg Oral Q0600  . potassium chloride  40 mEq Oral Once  . saccharomyces boulardii  250 mg Oral BID  . DISCONTD: antiseptic oral rinse  15 mL Mouth Rinse q12n4p  . DISCONTD: chlorhexidine  15 mL Mouth Rinse BID   Continuous Infusions:   .  sodium chloride 150 mL/hr at 08/28/11 1518   PRN Meds:.acetaminophen, acetaminophen, alum & mag hydroxide-simeth, HYDROmorphone, ondansetron, ondansetron, simethicone  Assessment/Plan:   Principal Problem:   *Enteritis  - patient continue to have abdominal pain - continue flagyl for now   Active Problems:  C. difficile colitis  - continue flagyl 500 mg PO Q 8 Hours  - patient reports no diarrhea at present   Hypokalemia  - likely secondary to previous diarrhea and vomiting  - diarrhea and vomiting subsided at present  - will continue to monitor  - repleted   EDUCATION  - test results and diagnostic studies were discussed with patient and pt's family who was present at the bedside  - patient and family have verbalized the understanding  - questions were answered at the bedside and contact information was provided for additional questions or concerns       LOS: 3 days   Ziana Heyliger 08/28/2011, 5:51 PM  [facility] Pager: [phone]

## 2011-08-29 LAB — GLUCOSE, CAPILLARY: Glucose-Capillary: 148 mg/dL — ABNORMAL HIGH (ref 70–99)

## 2011-08-29 LAB — BASIC METABOLIC PANEL
Calcium: 8.5 mg/dL (ref 8.4–10.5)
GFR calc Af Amer: >90 mL/min (ref >90)
GFR calc non Af Amer: >90 mL/min (ref >90)
Sodium: 139 mEq/L (ref 135–145)

## 2011-08-29 LAB — STOOL CULTURE

## 2011-08-29 MED ORDER — POTASSIUM CHLORIDE CRYS ER 20 MEQ PO TBCR
40.0000 meq | EXTENDED_RELEASE_TABLET | Freq: Once | ORAL | Status: AC
  Administered 2011-08-29: 40 meq via ORAL
  Filled 2011-08-29: qty 2

## 2011-08-29 MED ORDER — OXYCODONE-ACETAMINOPHEN 5-325 MG PO TABS: 1.0000 | ORAL_TABLET | ORAL | Status: DC | PRN

## 2011-08-29 MED ORDER — BISACODYL 10 MG RE SUPP: 10.0000 mg | Freq: Every day | RECTAL | Status: DC | PRN

## 2011-08-29 MED ORDER — HYDROCODONE-ACETAMINOPHEN 5-325 MG PO TABS
1.0000 | ORAL_TABLET | Freq: Four times a day (QID) | ORAL | Status: DC | PRN
  Administered 2011-08-29: 2 via ORAL
  Filled 2011-08-29: qty 2

## 2011-08-29 NOTE — Progress Notes (Signed)
Patient ID: Amanda Garner, female   DOB: [birth year], 41 y.o.   MRN: [id number] Subjective: No events overnight.   Objective:  Vital signs in last 24 hours:  Filed Vitals:   08/27/11 2147 08/28/11 1430 08/28/11 2302 08/29/11 0628  BP: 131/91 136/82 135/86 126/86  Pulse: 67 64 71 78  Temp: 98.1 F (36.7 C) 98.2 F (36.8 C) 98.1 F (36.7 C) 98 F (36.7 C)  TempSrc: Oral Oral Oral   Resp: 18 18 18 18   Height:      Weight:      SpO2: 96% 100% 95% 97%    Intake/Output from previous day:   Intake/Output Summary (Last 24 hours) at 08/29/11 1250 Last data filed at 08/29/11 5701  Gross per 24 hour  Intake   3161 ml  Output      5 ml  Net   3156 ml    Physical Exam: General: Alert, awake, oriented x3, in no acute distress. HEENT: No bruits, no goiter. Moist mucous membranes, no scleral icterus, no conjunctival pallor. Heart: Regular rate and rhythm, S1/S2 +, no murmurs, rubs, gallops. Lungs: Clear to auscultation bilaterally. No wheezing, no rhonchi, no rales.  Abdomen: Soft, nontender, nondistended, positive bowel sounds. Extremities: No clubbing or cyanosis, no pitting edema,  positive pedal pulses. Neuro: Grossly nonfocal.  Lab Results:  Basic Metabolic Panel:    Component Value Date/Time   NA 139 08/29/2011 0630   K 3.6 08/29/2011 0630   CL 109 08/29/2011 0630   CO2 25 08/29/2011 0630   BUN 4* 08/29/2011 0630   CREATININE 0.58 08/29/2011 0630   GLUCOSE 101* 08/29/2011 0630   CALCIUM 8.5 08/29/2011 0630   CBC:    Component Value Date/Time   WBC 5.3 08/27/2011 0500   HGB 11.1* 08/27/2011 0500   HCT 33.2* 08/27/2011 0500   PLT 173 08/27/2011 0500   MCV 66.9* 08/27/2011 0500   NEUTROABS 5.3 08/25/2011 1823   LYMPHSABS 1.3 08/25/2011 1823   MONOABS 0.5 08/25/2011 1823   EOSABS 0.1 08/25/2011 1823   BASOSABS 0.0 08/25/2011 1823      Lab 08/27/11 0500 08/25/11 1823  WBC 5.3 7.2  HGB 11.1* 14.0  HCT 33.2* 40.4  PLT 173 184  MCV 66.9* 66.8*  MCH 22.4* 23.1*  MCHC 33.4 34.7  RDW 14.6  14.7  LYMPHSABS -- 1.3  MONOABS -- 0.5  EOSABS -- 0.1  BASOSABS -- 0.0  BANDABS -- --    Lab 08/29/11 0630 08/27/11 0500 08/25/11 1823  NA 139 138 135  K 3.6 2.9* 3.4*  CL 109 106 103  CO2 25 24 20   GLUCOSE 101* 87 100*  BUN 4* <3* 8  CREATININE 0.58 0.61 0.64  CALCIUM 8.5 7.9* 9.3  MG -- -- --   No results found for this basename: INR:5,PROTIME:5 in the last 168 hours Cardiac markers: No results found for this basename: CK:3,CKMB:3,TROPONINI:3,MYOGLOBIN:3 in the last 168 hours No components found with this basename: POCBNP:3 Recent Results (from the past 240 hour(s))  CLOSTRIDIUM DIFFICILE BY PCR     Status: Abnormal   Collection Time   08/26/11  4:08 AM      Component Value Range Status Comment   C difficile by pcr POSITIVE (*) NEGATIVE  Final   STOOL CULTURE     Status: Normal (Preliminary result)   Collection Time   08/26/11  4:08 AM      Component Value Range Status Comment   Specimen Description STOOL   Final  Special Requests NONE   Final    Culture NO SUSPICIOUS COLONIES, CONTINUING TO HOLD   Final    Report Status PENDING   Incomplete     Studies/Results: No results found.  Medications: Scheduled Meds:   . metroNIDAZOLE  500 mg Oral Q8H  . pantoprazole  40 mg Oral Q0600  . potassium chloride  40 mEq Oral Once  . saccharomyces boulardii  250 mg Oral BID   Continuous Infusions:   . DISCONTD: sodium chloride 150 mL/hr at 08/29/11 0516   PRN Meds:.acetaminophen, acetaminophen, alum & mag hydroxide-simeth, bisacodyl, HYDROcodone-acetaminophen, ondansetron, ondansetron, simethicone, DISCONTD: HYDROmorphone, DISCONTD: oxyCODONE-acetaminophen  Assessment/Plan:   Principal Problem:   *Enteritis  - continue flagyl for now   Active Problems:   C. difficile colitis  - continue flagyl 500 mg PO Q 8 Hours  - patient reports no diarrhea at present  - colonoscopy as an outpatient  Hypokalemia  - likely secondary to previous diarrhea and vomiting  -  diarrhea and vomiting subsided at present  - resolved  EDUCATION  - test results and diagnostic studies were discussed with patient and pt's family who was present at the bedside  - patient and family have verbalized the understanding  - questions were answered at the bedside and contact information was provided for additional questions or concerns      LOS: 4 days   DEVINE, ALMA 08/29/2011, 12:50 PM  [facility] Pager: [phone]

## 2011-08-29 NOTE — Progress Notes (Addendum)
[facility] Progress Note  Subjective: Had a hard time sleeping last night so feels tired today.  However, pain continues to improve.  Still no BM since late Thursday.  Abd feels "full" but still overall much improved pains.  She notes she had not needed "pain pill since late yesterday". Tolerated breakfast.  No n/v.  No fevers.  Freq urination  Objective:  Vital signs in last 24 hours: Temp:  [98 F (36.7 C)-98.2 F (36.8 C)] 98 F (36.7 C) (01/06 0628) Pulse Rate:  [64-78] 78  (01/06 0628) Resp:  [18] 18  (01/06 0628) BP: (126-136)/(82-86) 126/86 mmHg (01/06 0628) SpO2:  [95 %-100 %] 97 % (01/06 0628) Last BM Date: 08/26/11 General: Alert, Well-developed, NAD, lying in bed Heart: Regular rate and rhythm; no murmurs  Chest: CTA b/l  Abdomen: Soft, mild-mod distention, tender in the mid and right abd, though less than yesterday without rebound or guarding, +BS throughout Extremities: Without edema.  Neurologic: Alert and oriented x4; grossly normal neurologically.  Psych: Alert and cooperative. Normal mood and affect.   Intake/Output from previous day: 01/05 0701 - 01/06 0700 In: 3401 [P.O.:480; I.V.:2921] Out: 5 [Stool:5] Intake/Output this shift:    Lab Results:  Basename 08/27/11 0500  WBC 5.3  HGB 11.1*  HCT 33.2*  PLT 173   BMET  Basename 08/29/11 0630 08/27/11 0500  NA 139 138  K 3.6 2.9*  CL 109 106  CO2 25 24  GLUCOSE 101* 87  BUN 4* <3*  CREATININE 0.58 0.61  CALCIUM 8.5 7.9*    Assessment / Plan: 1. C-Diff - normal WBC, afebrile. Continue oral Flagyl as she is improving from a pain standpoint (will not switch to vanco given improvement).  At this point I think we can minimize narcotics, d/c IV dilaudid, will given prn hydrocodone.  Avoid NSAIDs given possibility of IBD.  Encourage ambulation.    2. Ileitis/colitis -- difficult to know for sure, but she may have underlying IBD with superimposed c diff colitis. Her imaging reveals TI  inflammation which would be consistent with Crohn's. Diarrhea overall has stopped. Based on improvement, I would defer colon to outpatient setting unless there is a major setback at this point.  This can be done relatively quickly after d/c.   3. GERD - on PPI. Added Maalox for breakthrough indigestion.  4. Consitpation - bisacodyl suppository PRN, likely narc-related and with improving infection.  --Will d/c IVFs, encourage PO hydration.  Principal Problem:  *Enteritis Active Problems:  C. difficile colitis  Abdominal  pain, other specified site  Diarrhea  Abnormal finding on GI tract imaging     LOS: 4 days   PYRTLE, JAY M  08/29/2011, 11:20 AM

## 2011-08-30 ENCOUNTER — Encounter: Payer: Self-pay | Admitting: Internal Medicine

## 2011-08-30 ENCOUNTER — Encounter (HOSPITAL_COMMUNITY): Payer: Self-pay | Admitting: Physician Assistant

## 2011-08-30 MED ORDER — SIMETHICONE 80 MG PO CHEW: 80.0000 mg | CHEWABLE_TABLET | Freq: Four times a day (QID) | ORAL | Status: DC | PRN

## 2011-08-30 MED ORDER — HYDROCODONE-ACETAMINOPHEN 5-325 MG PO TABS: 1.0000 | ORAL_TABLET | Freq: Four times a day (QID) | ORAL | Status: AC | PRN

## 2011-08-30 MED ORDER — FERROUS SULFATE 325 (65 FE) MG PO TABS: 325.0000 mg | ORAL_TABLET | Freq: Every day | ORAL | Status: DC

## 2011-08-30 MED ORDER — ALUM & MAG HYDROXIDE-SIMETH 200-200-20 MG/5ML PO SUSP: 30.0000 mL | Freq: Four times a day (QID) | ORAL | Status: AC | PRN

## 2011-08-30 MED ORDER — METRONIDAZOLE 500 MG PO TABS: 500.0000 mg | ORAL_TABLET | Freq: Three times a day (TID) | ORAL | Status: AC

## 2011-08-30 NOTE — Discharge Summary (Signed)
Patient ID: MORGIN HALLS MRN: [id number] DOB/AGE: [birth year] 41 y.o.  Admit date: 08/25/2011 Discharge date: 08/30/2011  Primary Care Physician:  Per Patient No Pcp  Discharge Diagnoses:     Principal Problem:  *C. difficile   Active Problems:  Enteritis ( Ileitis/ colitis)  Abdominal  pain, other specified site  Diarrhea    Current Discharge Medication List    START taking these medications   Details  alum & mag hydroxide-simeth (MAALOX/MYLANTA) 200-200-20 MG/5ML suspension Take 30 mLs by mouth every 6 (six) hours as needed. Qty: 150 mL, Refills: 0    ferrous sulfate 325 (65 FE) MG tablet Take 1 tablet (325 mg total) by mouth daily with breakfast. Qty: 30 tablet, Refills: 2    HYDROcodone-acetaminophen (NORCO) 5-325 MG per tablet Take 1 tablet by mouth every 6 (six) hours as needed. Qty: 15 tablet, Refills: 0    metroNIDAZOLE (FLAGYL) 500 MG tablet Take 1 tablet (500 mg total) by mouth every 8 (eight) hours. Qty: 15 tablet, Refills: 0           STOP taking these medications     DM-Diphenhydramine-APAP (DIABETIC TUSSIN COLD & FLU) 10-12.5-325 MG/5ML LIQD         Disposition and Follow-up:  Follow up with health serv as instructed Follow up with Dr Hilarie Fredrickson on 01/21  Consults:   Ulice Dash Pyrtle ( GI)  Significant Diagnostic Studies:  Ct Abdomen Pelvis W Contrast  08/25/2011  *RADIOLOGY REPORT*  Clinical Data: Left lower quadrant abdominal pain, vomiting, diarrhea  CT ABDOMEN AND PELVIS WITH CONTRAST  Technique:  Multidetector CT imaging of the abdomen and pelvis was performed following the standard protocol during bolus administration of intravenous contrast.  Contrast: 29m OMNIPAQUE IOHEXOL 300 MG/ML IV SOLN, 1017mOMNIPAQUE IOHEXOL 300 MG/ML IV SOLN  Comparison: 02/12/2009  Findings: Mild dependent changes in the lung bases.  Small esophageal hiatal hernia with suggestion of mild thickening of the lower esophageal wall which might represent reflux disease.  The  liver, spleen, gallbladder, pancreas, adrenal glands, abdominal aorta, and retroperitoneal lymph nodes are unremarkable.  Mild atrophy or scarring in the lower pole of the right kidney.  No solid mass or hydronephrosis demonstrated in the kidneys.  No gastric wall thickening.    Small broad-based umbilical hernia containing fat.  Adjacent circumscribed fat lobule in the subcutaneous soft tissues suggesting either lipoma or small fat containing hernia.  No free air in the abdomen.  Pelvis:  There is diffuse wall thickening and edema involving multiple loops of small bowel in the lower abdomen and pelvis. There is surrounding inflammatory stranding in the mesenteric fat. Small free fluid collections in the pericolic gutters and extending down into the pelvis. Density measurements of the fluid are intermediate, likely representing ascites. Hemorrhage not entirely excluded.  The colon is decompressed and predominately stool filled although there is a suggestion of possible thickening of the wall of the cecal region.  Scattered diverticula.  The appearance is consistent with enteritis and may be due to inflammatory/infectious cause, Crohn disease, or ischemia.  Flow is demonstrated in the superior mesenteric artery and veins, arguing against ischemia.  No free air or pneumatosis.  No proximal small bowel dilatation.  Normal alignment of the lumbar vertebrae. Uterus and adnexal structures are not enlarged.  Diverticulosis of the sigmoid colon without inflammatory change.  Appendix is not visualized. Mesenteric lymph nodes are moderately prominent but not pathologically enlarged consistent with reactive change.  IMPRESSION: Diffuse wall thickening and edema of the distal  small bowel per involving ileum with mild dilatation and inflammatory stranding in the mesentery.  Small amount of fluid in the pelvis and mesentery. Possible involvement of the cecum.  Inflammatory or reactive lymph nodes in the mesentery.  Changes  suggest enteritis and may represent Crohn disease or infectious/inflammatory cause.  Ischemia is less likely.  Results discussed with Dr. Larose Kells at the time of dictation, 2116 hours on 08/25/2011  Original Report Authenticated By: Neale Burly, M.D.    Brief H and P: For complete details please refer to admission H and P, but in brief 41 year-old female with no significant past medical history presently complains of abdominal pain with multiple episodes of nausea and vomiting and diarrhea since today morning. She denies having had any uncooked food or recent travel or recent use of any antibiotics. The pain is periumbilical colicky in nature. In the ER patient had a CAT scan of the abdomen and pelvis which shows enteritis involving the ileum. Patient will be admitted for further management.    Physical Exam on Discharge:  Filed Vitals:   08/29/11 0628 08/29/11 1430 08/29/11 2235 08/30/11 0710  BP: 126/86 148/93 148/93 127/84  Pulse: 78 76 63 79  Temp: 98 F (36.7 C) 98.3 F (36.8 C) 97.8 F (36.6 C) 97.7 F (36.5 C)  TempSrc:  Oral Oral   Resp: 18 18 19 18   Height:      Weight:      SpO2: 97% 93% 100% 100%     Intake/Output Summary (Last 24 hours) at 08/30/11 1401 Last data filed at 08/30/11 0700  Gross per 24 hour  Intake    480 ml  Output      5 ml  Net    475 ml    General: middle aged female in no acute distress. HEENT: No bruits, no goiter. Heart: Regular rate and rhythm, without murmurs, rubs, gallops. Lungs: Clear to auscultation bilaterally. Abdomen: Soft, nontender, nondistended, positive bowel sounds. Extremities: No clubbing cyanosis or edema with positive pedal pulses. Neuro: Alert, awake, oriented x3,Grossly intact, nonfocal.  CBC:    Component Value Date/Time   WBC 5.3 08/27/2011 0500   HGB 11.1* 08/27/2011 0500   HCT 33.2* 08/27/2011 0500   PLT 173 08/27/2011 0500   MCV 66.9* 08/27/2011 0500   NEUTROABS 5.3 08/25/2011 1823   LYMPHSABS 1.3 08/25/2011 1823    MONOABS 0.5 08/25/2011 1823   EOSABS 0.1 08/25/2011 1823   BASOSABS 0.0 08/25/2011 3825    Basic Metabolic Panel:    Component Value Date/Time   NA 139 08/29/2011 0630   K 3.6 08/29/2011 0630   CL 109 08/29/2011 0630   CO2 25 08/29/2011 0630   BUN 4* 08/29/2011 0630   CREATININE 0.58 08/29/2011 0630   GLUCOSE 101* 08/29/2011 0630   CALCIUM 8.5 08/29/2011 0630    Hospital Course:   c diff infection Patient admitted to medical floor with symptoms of abdominal pain with diarrhea. abdominal CT showed a Diffuse wall thickening and edema of the distal small bowel per involving ileum with mild dilatation and inflammatory stranding in the mesentery.  normal WBC and  afebrile. Started on  oral Flagyl and her diarrhea now resolved. Seen by GI consultand agree wth dicharge home on 10 day course of flagyl with outpt follow up.  2. Ileitis/colitis   she may have underlying IBD with superimposed c diff colitis. CT abdomen shows  Inflammation of terminal ileum  inflammation which would be consistent with Crohn's. Since patient is  improving she will be seen by GI as outpatient and schedule for colonoscopy.  3. Iron deficiency anemia Started on iron tablets  She is clinically stable for discharge and will follow up with health serv and GI ( Dr Hilarie Fredrickson) as outpatient. Planned on outpatient colonoscopy which will be scheduled as outpt.    Time spent on Discharge: 45 minutes  Signed: Louellen Molder 08/30/2011, 2:01 PM

## 2011-08-30 NOTE — Progress Notes (Signed)
     [facility] Daily Rounding Note 08/30/2011, 8:39 AM  SUBJECTIVE: Tolerating diet, no problem.  No belly pain, formed BMs x 2 yesterday.  Feeels well enough to go home but weak so would like work excuse through next Sunday, go back to work on Monday.  OBJECTIVE: General:  Looks well, NAD.  Vital signs in last 24 hours: Temp:  [97.7 F (36.5 C)-98.3 F (36.8 C)] 97.7 F (36.5 C) (01/07 0710) Pulse Rate:  [63-79] 79  (01/07 0710) Resp:  [18-19] 18  (01/07 0710) BP: (127-148)/(84-93) 127/84 mmHg (01/07 0710) SpO2:  [93 %-100 %] 100 % (01/07 0710) Last BM Date: 08/29/11  Heart: RRR, no MRG Chest: Clear, no cough Abdomen: Soft, NT, ND.  Active BS.  Extremities: no edema Neuro/Psych:  Pleasant, relaxed.  Intake/Output from previous day: 01/06 0701 - 01/07 0700 In: 720 [P.O.:720] Out: 5 [Urine:5]  Intake/Output this shift:    Lab Results: No results found for this basename: WBC:3,HGB:3,HCT:3,PLT:3 in the last 72 hours BMET  Basename 08/29/11 0630  NA 139  K 3.6  CL 109  CO2 25  GLUCOSE 101*  BUN 4*  CREATININE 0.58  CALCIUM 8.5   ASSESMENT: 1.  C Diff:  Day 4 - 5 of Metronidazole,  Good response to therapy.  Should complete 10 days of treatment 2.  Ileitis, r/o Crohns Dz.  Plan out pt Colonoscopy. 3.  Hx Divericulosis. 4.  Microcytosis, mild anemia. B12, Folate, Ferritin normal.  Iron, TIBC, Iron Sat all low.  Dx of sickle cell trait in past, used to take PO Iron. Should restart this. 5.  Lack of health care insurance, no primary care MD PLAN: 1.  ROV dr Hilarie Fredrickson:  Jan 21, 2:45 PM.  At that visit, arrange for colonoscopy. 2.  Start once daily FeS04.  3. Finish 10 days total abx.  Does not need Protonix unless having continuous GERD, it raises risk for C Diff. I will D/C this.  Does not need saccharomyces at discharge, just added expense.  I will d/c this.    LOS: 5 days   Azucena Freed  08/30/2011, 8:39 AM Pager: [phone]

## 2011-08-30 NOTE — Progress Notes (Signed)
Discharge instructions/Med Rec Sheet reviewed w/ pt. Pt expressed understanding and copies given w/ prescriptions. Pt d/c'd in stable condition via w/c, accompanied by NT

## 2011-08-30 NOTE — Progress Notes (Signed)
   CARE MANAGEMENT NOTE 08/30/2011  Patient:  Amanda Garner, Amanda Garner   Account Number:  [id number]  Date Initiated:  08/30/2011  Documentation initiated by:  Tomi Bamberger  Subjective/Objective Assessment:   enteritis- cdiff  admit     Action/Plan:   Anticipated DC Date:  08/30/2011   Anticipated DC Plan:  [place]  CM consult  [facility]      Choice offered to / List presented to:             Status of service:  Completed, signed off Medicare Important Message given?   (If response is "NO", the following Medicare IM given date fields will be blank) Date Medicare IM given:   Date Additional Medicare IM given:    Discharge Disposition:  HOME/SELF CARE  Per UR Regulation:    Comments:  PCP [facility]  eligibility apt 1/30 at 10 am, hospital f/u 2/14 at 2:30 with Dr. Rosezetta Schlatter  08/30/11 12:59 Tomi Bamberger RN, BSn [phone] Patient for discharge today, patient is eligible for zz fund if needed.

## 2011-08-30 NOTE — Progress Notes (Signed)
I have reviewed the above note, examined the patient and agree with plan of treatment.

## 2011-08-31 ENCOUNTER — Telehealth: Payer: Self-pay | Admitting: *Deleted

## 2011-08-31 NOTE — Telephone Encounter (Signed)
Cell and home number isn't accepting calls. I called the work number and someone will have pt call us. Per Dr Hilarie Fredrickson, pt needs a COLON in about a month; after she completes CDIFF therapy.

## 2011-09-01 NOTE — Telephone Encounter (Signed)
Pt called to report her number is [phone] and she states she is doing much better. Informed her we need to schedule her for a COLON. Pt reports she is still trying to get insurance; she has an appt on 09/22/11 with [facility]. She has an appt with Dr Hilarie Fredrickson on 09/13/11 at 2:45 and maybe we can schedule the COLON then; pt stated understanding.

## 2011-09-09 ENCOUNTER — Encounter: Payer: Self-pay | Admitting: Internal Medicine

## 2011-09-13 ENCOUNTER — Encounter: Payer: Self-pay | Admitting: Internal Medicine

## 2011-09-13 ENCOUNTER — Ambulatory Visit (INDEPENDENT_AMBULATORY_CARE_PROVIDER_SITE_OTHER): Payer: Self-pay | Admitting: Internal Medicine

## 2011-09-13 VITALS — BP 100/74 | HR 88 | Ht 66.0 in | Wt 184.6 lb

## 2011-09-13 DIAGNOSIS — R1013 Epigastric pain: Secondary | ICD-10-CM

## 2011-09-13 DIAGNOSIS — R9389 Abnormal findings on diagnostic imaging of other specified body structures: Secondary | ICD-10-CM

## 2011-09-13 DIAGNOSIS — A0472 Enterocolitis due to Clostridium difficile, not specified as recurrent: Secondary | ICD-10-CM

## 2011-09-13 MED ORDER — PEG-KCL-NACL-NASULF-NA ASC-C 100 G PO SOLR: 1.0000 | Freq: Once | ORAL | Status: DC

## 2011-09-13 MED ORDER — OMEPRAZOLE 40 MG PO CPDR: 40.0000 mg | DELAYED_RELEASE_CAPSULE | Freq: Every day | ORAL | Status: DC

## 2011-09-13 NOTE — Progress Notes (Signed)
Subjective:    Patient ID: Amanda Garner, female    DOB: 1970/12/31, 41 y.o.   MRN: [id number]  HPI Amanda Garner is a 41 year old female with a PMH of diverticulitis, anemia, and sickle cell trait who was recently hospitalized with abdominal pain found to have C. difficile colitis and abnormal GI imaging by CT scan who is seen in hospital followup. She is alone today. During hospitalization she was treated for abdominal pain and C. difficile colitis with metronidazole. She completed metronidazole therapy 4 days ago. She reports dramatic improvement in her overall abdominal pain, though she still feels some epigastric burning pain after meals. She is occasionally having heartburn. No dysphagia or odynophagia. No nausea or vomiting. Bowel movements are much improved and she denies diarrhea. She is having one soft bowel movement daily without blood or melena. She is eating okay and her weight has been stable. No fevers or chills. No mouth ulcers, rashes, eye complaints. She does have chronic pain in her knees. She is taking iron daily but no other medications at present. She has gone back to work, she works at [employer].  Review of Systems As per history of present illness otherwise negative  Past Medical History  Diagnosis Date  . Diverticulitis   . Blood transfusion   . Anemia Dx: 2001 or so   . Sickle cell trait    Past Surgical History  Procedure Date  . Cesarean section 1997; 1997; 2001   Current Outpatient Prescriptions  Medication Sig Dispense Refill  . ferrous sulfate 325 (65 FE) MG tablet Take 1 tablet (325 mg total) by mouth daily with breakfast.  30 tablet  2  . omeprazole (PRILOSEC) 40 MG capsule Take 1 capsule (40 mg total) by mouth daily.  30 capsule  1  . peg 3350 powder (MOVIPREP) 100 G SOLR Take 1 kit (100 g total) by mouth once.  1 kit  0   No Known Allergies  Family History  Problem Relation Age of Onset  . Diabetes type II Mother   . Diabetes type II Father   .  Stroke Father   neg for IBD, colon cancer   Social History  . Marital Status: Single    Number of Children: 3   Occupational History  . TEAM MEMBER TRAINER [employer]   Social History Main Topics  . Smoking status: Former Smoker -- 0.5 packs/day for 15 years    Types: Cigarettes    Quit date: 08/24/2011  . Smokeless tobacco: Never Used  . Alcohol Use: No  . Drug Use: No     Objective:   Physical Exam BP 100/74  Pulse 88  Ht 5' 6"  (1.676 m)  Wt 184 lb 9.6 oz (83.734 kg)  BMI 29.80 kg/m2  LMP 08/20/2011 Constitutional: Well-developed and well-nourished. No distress. HEENT: Normocephalic and atraumatic. Oropharynx is clear and moist. No oropharyngeal exudate. Conjunctivae are normal. Pupils are equal round and reactive to light. No scleral icterus. Neck: Neck supple. Trachea midline. Cardiovascular: Normal rate, regular rhythm and intact distal pulses. No M/R/G Pulmonary/chest: Effort normal and breath sounds normal. No wheezing, rales or rhonchi. Abdominal: Soft, obese, mild mid abdominal tenderness without rebound or guarding to deep palpation, nondistended. Bowel sounds active throughout. There are no masses palpable. No hepatosplenomegaly. Extremities: no clubbing, cyanosis, or edema Lymphadenopathy: No cervical adenopathy noted. Neurological: Alert and oriented to person place and time. Skin: Skin is warm and dry. No rashes noted. Psychiatric: Normal mood and affect. Behavior is normal.  Imaging Review CT ABDOMEN AND PELVIS WITH CONTRAST  08/25/2011  Findings: Mild dependent changes in the lung bases. Small  esophageal hiatal hernia with suggestion of mild thickening of the  lower esophageal wall which might represent reflux disease. The  liver, spleen, gallbladder, pancreas, adrenal glands, abdominal  aorta, and retroperitoneal lymph nodes are unremarkable. Mild  atrophy or scarring in the lower pole of the right kidney. No  solid mass or hydronephrosis demonstrated  in the kidneys. No  gastric wall thickening. Small broad-based umbilical hernia  containing fat. Adjacent circumscribed fat lobule in the  subcutaneous soft tissues suggesting either lipoma or small fat  containing hernia. No free air in the abdomen.  Pelvis: There is diffuse wall thickening and edema involving  multiple loops of small bowel in the lower abdomen and pelvis.  There is surrounding inflammatory stranding in the mesenteric fat.  Small free fluid collections in the pericolic gutters and extending  down into the pelvis. Density measurements of the fluid are  intermediate, likely representing ascites. Hemorrhage not entirely  excluded. The colon is decompressed and predominately stool filled  although there is a suggestion of possible thickening of the wall  of the cecal region. Scattered diverticula. The appearance is  consistent with enteritis and may be due to inflammatory/infectious  cause, Crohn disease, or ischemia. Flow is demonstrated in the  superior mesenteric artery and veins, arguing against ischemia. No  free air or pneumatosis. No proximal small bowel dilatation.  Normal alignment of the lumbar vertebrae. Uterus and adnexal  structures are not enlarged. Diverticulosis of the sigmoid colon  without inflammatory change. Appendix is not visualized.  Mesenteric lymph nodes are moderately prominent but not  pathologically enlarged consistent with reactive change.   IMPRESSION:  Diffuse wall thickening and edema of the distal small bowel per  involving ileum with mild dilatation and inflammatory stranding in  the mesentery. Small amount of fluid in the pelvis and mesentery.  Possible involvement of the cecum. Inflammatory or reactive lymph  nodes in the mesentery. Changes suggest enteritis and may  represent Crohn disease or infectious/inflammatory cause. Ischemia  is less likely.  CBC    Component Value Date/Time   WBC 5.3 08/27/2011 0500   RBC 4.96 08/27/2011 0500    HGB 11.1* 08/27/2011 0500   HCT 33.2* 08/27/2011 0500   PLT 173 08/27/2011 0500   MCV 66.9* 08/27/2011 0500   MCH 22.4* 08/27/2011 0500   MCHC 33.4 08/27/2011 0500   RDW 14.6 08/27/2011 0500   LYMPHSABS 1.3 08/25/2011 1823   MONOABS 0.5 08/25/2011 1823   EOSABS 0.1 08/25/2011 1823   BASOSABS 0.0 08/25/2011 1823       Assessment & Plan:   41 year old female with a PMH of diverticulitis, anemia, and sickle cell trait who was recently hospitalized with abdominal pain found to have C. difficile colitis and abnormal GI imaging by CT scan who is seen in hospital followup.  1.  C. difficile colitis/abnormal GI imaging/abdominal pain -- overall the patient is dramatically improved from a symptom standpoint with treatment of her C. difficile colitis. She completed metronidazole therapy. Clinically, she seems to have resolved this infection. Based on our discussions in the hospital, specifically that she had had some change in bowel habits for several months before developing acute pain and diarrhea, along with the abnormal imaging showing inflammation in the terminal ileum, I feel it is very necessary to rule out underlying inflammatory bowel disease. We have discussed Crohn's disease today, and colonoscopy to  visualize the colonic and hopefully terminal ileal mucosa. She is agreeable to proceed and this will be scheduled for her. She has an upcoming appointment for registration with [facility], and therefore we will try to schedule a colonoscopy after this so as to try to avoid significant financial burden on her. I have asked that should her diarrhea or abdominal pain worsen prior to colonoscopy that she contact us immediately. She voiced understanding to  2. Epigastric pain/burning -- she is having mild dyspeptic symptoms, and I will start her on omeprazole 20 mg daily. She is given samples today.  Followup to be determined after colonoscopy

## 2011-09-13 NOTE — Patient Instructions (Signed)
You have been scheduled for a colonoscopy. Please follow written instructions given to you at your visit today.  Please pick up your prep kit at the pharmacy within the next 2-3 days.  We have sent the following medications to your pharmacy for you to pick up at your convenience: Moviprep, instructions were given to you today at your visit

## 2011-10-14 ENCOUNTER — Encounter: Payer: Self-pay | Admitting: Internal Medicine

## 2012-02-16 ENCOUNTER — Emergency Department (INDEPENDENT_AMBULATORY_CARE_PROVIDER_SITE_OTHER)
Admission: EM | Admit: 2012-02-16 | Discharge: 2012-02-16 | Disposition: A | Discharge status: Home / Self Care | Payer: Self-pay | Source: Home / Self Care | Attending: Emergency Medicine | Admitting: Emergency Medicine

## 2012-02-16 ENCOUNTER — Encounter (HOSPITAL_COMMUNITY): Payer: Self-pay

## 2012-02-16 DIAGNOSIS — S91309A Unspecified open wound, unspecified foot, initial encounter: Secondary | ICD-10-CM

## 2012-02-16 DIAGNOSIS — S91332A Puncture wound without foreign body, left foot, initial encounter: Secondary | ICD-10-CM

## 2012-02-16 MED ORDER — TETANUS-DIPHTH-ACELL PERTUSSIS 5-2.5-18.5 LF-MCG/0.5 IM SUSP
0.5000 mL | Freq: Once | INTRAMUSCULAR | Status: AC
  Administered 2012-02-16: 0.5 mL via INTRAMUSCULAR

## 2012-02-16 MED ORDER — IBUPROFEN 600 MG PO TABS: 600.0000 mg | ORAL_TABLET | Freq: Four times a day (QID) | ORAL | Status: AC | PRN

## 2012-02-16 MED ORDER — AMOXICILLIN-POT CLAVULANATE 500-125 MG PO TABS: 1.0000 | ORAL_TABLET | Freq: Two times a day (BID) | ORAL | Status: AC

## 2012-02-16 MED ORDER — TETANUS-DIPHTH-ACELL PERTUSSIS 5-2.5-18.5 LF-MCG/0.5 IM SUSP
INTRAMUSCULAR | Status: AC
  Filled 2012-02-16: qty 0.5

## 2012-02-16 MED ORDER — TETANUS-DIPHTH-ACELL PERTUSSIS 5-2.5-18.5 LF-MCG/0.5 IM SUSP: 0.5000 mL | Freq: Once | INTRAMUSCULAR | Status: DC

## 2012-02-16 NOTE — ED Provider Notes (Signed)
History     CSN: [id number]  Arrival date & time 02/16/12  1305   First MD Initiated Contact with Patient 02/16/12 1454      Chief Complaint  Patient presents with  . Puncture Wound    (Consider location/radiation/quality/duration/timing/severity/associated sxs/prior treatment) HPI Comments: Patient was walking last night barefooted and stepped on a nail which she later on removed from her left foot. It's very sore and tender at the puncture site she's not actively bleeding but is very tender when she walks on her at touch is the area. She also expresses concern that she does not remember when was the last time that she got her tetanus shot.  The history is provided by the patient.    Past Medical History  Diagnosis Date  . Diverticulitis   . Blood transfusion   . Anemia Dx: 2001 or so   . Sickle cell trait     Past Surgical History  Procedure Date  . Cesarean section 1997; 1997; 2001    Family History  Problem Relation Age of Onset  . Diabetes type II Mother   . Diabetes type II Father   . Stroke Father     History  Substance Use Topics  . Smoking status: Former Smoker -- 0.5 packs/day for 15 years    Types: Cigarettes    Quit date: 08/24/2011  . Smokeless tobacco: Never Used  . Alcohol Use: No    OB History    Grav Para Term Preterm Abortions TAB SAB Ect Mult Living                  Review of Systems  Constitutional: Negative for fever and chills.  Musculoskeletal: Negative for myalgias, back pain and joint swelling.  Skin: Positive for wound. Negative for color change.    Allergies  Review of patient's allergies indicates no known allergies.  Home Medications   Current Outpatient Rx  Name Route Sig Dispense Refill  . AMOXICILLIN-POT CLAVULANATE 500-125 MG PO TABS Oral Take 1 tablet (500 mg total) by mouth 2 (two) times daily at 10 AM and 5 PM. 20 tablet 0  . FERROUS SULFATE 325 (65 FE) MG PO TABS Oral Take 1 tablet (325 mg total) by mouth daily  with breakfast. 30 tablet 2  . IBUPROFEN 600 MG PO TABS Oral Take 1 tablet (600 mg total) by mouth every 6 (six) hours as needed for pain. 30 tablet 0  . OMEPRAZOLE 40 MG PO CPDR Oral Take 1 capsule (40 mg total) by mouth daily. 30 capsule 1  . PEG-KCL-NACL-NASULF-NA ASC-C 100 G PO SOLR Oral Take 1 kit (100 g total) by mouth once. 1 kit 0    BP 152/103  Pulse 70  Temp 98 F (36.7 C) (Oral)  Resp 17  SpO2 100%  LMP 08/20/2011  Physical Exam  Nursing note and vitals reviewed. Constitutional: She is oriented to person, place, and time. Vital signs are normal. She appears well-developed and well-nourished.  Non-toxic appearance. She does not have a sickly appearance. She does not appear ill. No distress.  Eyes: Conjunctivae are normal.  Neck: Neck supple.  Musculoskeletal: She exhibits tenderness.       Left foot: She exhibits tenderness and swelling. She exhibits no bony tenderness.       Feet:  Neurological: She is alert and oriented to person, place, and time.  Skin: No rash noted. No erythema. No pallor.    ED Course  Procedures (including critical care time)  Labs  Reviewed - No data to display No results found.   1. Puncture wound of left foot       MDM  Puncture wound to left sole, reports she was walking barefooted and stepped on a nail she removed completely without difficulty. Patient has no neurological deficit. Area of soft tissue swelling and tenderness. Today we updated her tetanus. Started on Augmentin. Guidance for return in 48 hours. At this point I doubt there is any frame body remaining as transillumination test and patient reports nail was removed completely without any fragmentation. Have encouraged her still to return in 48-72 hours if no improvement for recheck.        Rosana Hoes, MD 02/16/12 [phone]

## 2012-02-16 NOTE — ED Notes (Addendum)
Stepped on nail last night , walking barefooted. Spouse reportedly removed nail w/o difficulty, but pt c/o FB sensation in puncture site (heel) NAD, but c/o pain is "10", has not used any med for this issue ; unsure of last tetanus update

## 2012-02-16 NOTE — Discharge Instructions (Signed)
Avoid weight bearing on your left foot for the next 48 hours. Return if no significant improvement in 2 days. For recheck. Start with antibiotics today and keep her foot elevated as much as possible.

## 2012-07-07 ENCOUNTER — Inpatient Hospital Stay (HOSPITAL_COMMUNITY)
Admission: EM | Admit: 2012-07-07 | Discharge: 2012-07-09 | DRG: 379 | Disposition: A | Discharge status: Home / Self Care | Payer: MEDICAID | Attending: Internal Medicine | Admitting: Internal Medicine

## 2012-07-07 ENCOUNTER — Encounter (HOSPITAL_COMMUNITY): Payer: Self-pay | Admitting: Emergency Medicine

## 2012-07-07 ENCOUNTER — Observation Stay (HOSPITAL_COMMUNITY): Payer: Self-pay

## 2012-07-07 DIAGNOSIS — K5733 Diverticulitis of large intestine without perforation or abscess with bleeding: Principal | ICD-10-CM | POA: Diagnosis present

## 2012-07-07 DIAGNOSIS — D573 Sickle-cell trait: Secondary | ICD-10-CM | POA: Diagnosis present

## 2012-07-07 DIAGNOSIS — F17200 Nicotine dependence, unspecified, uncomplicated: Secondary | ICD-10-CM | POA: Diagnosis present

## 2012-07-07 DIAGNOSIS — K5732 Diverticulitis of large intestine without perforation or abscess without bleeding: Secondary | ICD-10-CM

## 2012-07-07 DIAGNOSIS — Z23 Encounter for immunization: Secondary | ICD-10-CM

## 2012-07-07 LAB — URINALYSIS, ROUTINE W REFLEX MICROSCOPIC
Leukocytes, UA: NEGATIVE
Nitrite: NEGATIVE
Specific Gravity, Urine: 1.018 (ref 1.005–1.030)
Urobilinogen, UA: 1 mg/dL (ref 0.0–1.0)

## 2012-07-07 LAB — CBC WITH DIFFERENTIAL/PLATELET
Basophils Absolute: 0 10*3/uL (ref 0.0–0.1)
Basophils Relative: 0 % (ref 0–1)
HCT: 39.6 % (ref 36.0–46.0)
Hemoglobin: 13.8 g/dL (ref 12.0–15.0)
Lymphocytes Relative: 20 % (ref 12–46)
Monocytes Relative: 8 % (ref 3–12)
Neutro Abs: 5.9 10*3/uL (ref 1.7–7.7)
RDW: 14.2 % (ref 11.5–15.5)
WBC: 8.4 10*3/uL (ref 4.0–10.5)

## 2012-07-07 LAB — BASIC METABOLIC PANEL
GFR calc non Af Amer: >90 mL/min (ref >90)
Glucose, Bld: 103 mg/dL — ABNORMAL HIGH (ref 70–99)
Potassium: 3.1 mEq/L — ABNORMAL LOW (ref 3.5–5.1)
Sodium: 135 mEq/L (ref 135–145)

## 2012-07-07 LAB — HEPATIC FUNCTION PANEL
ALT: 7 U/L (ref 0–35)
AST: 13 U/L (ref 0–37)
Albumin: 3.3 g/dL — ABNORMAL LOW (ref 3.5–5.2)
Bilirubin, Direct: 0.1 mg/dL (ref 0.0–0.3)
Total Protein: 6.8 g/dL (ref 6.0–8.3)

## 2012-07-07 LAB — CBC
HCT: 35.8 % — ABNORMAL LOW (ref 36.0–46.0)
Hemoglobin: 12.3 g/dL (ref 12.0–15.0)
MCV: 65.4 fL — ABNORMAL LOW (ref 78.0–100.0)
RBC: 5.47 MIL/uL — ABNORMAL HIGH (ref 3.87–5.11)
WBC: 7.2 10*3/uL (ref 4.0–10.5)

## 2012-07-07 LAB — CREATININE, SERUM
GFR calc Af Amer: >90 mL/min (ref >90)
GFR calc non Af Amer: >90 mL/min (ref >90)

## 2012-07-07 LAB — URINE MICROSCOPIC-ADD ON

## 2012-07-07 MED ORDER — METRONIDAZOLE IN NACL 5-0.79 MG/ML-% IV SOLN
500.0000 mg | Freq: Three times a day (TID) | INTRAVENOUS | Status: DC
  Administered 2012-07-08 – 2012-07-09 (×5): 500 mg via INTRAVENOUS
  Filled 2012-07-07 (×6): qty 100

## 2012-07-07 MED ORDER — MORPHINE SULFATE 4 MG/ML IJ SOLN
4.0000 mg | Freq: Once | INTRAMUSCULAR | Status: AC
  Administered 2012-07-07: 4 mg via INTRAVENOUS
  Filled 2012-07-07: qty 1

## 2012-07-07 MED ORDER — IOHEXOL 300 MG/ML  SOLN
20.0000 mL | INTRAMUSCULAR | Status: AC
  Administered 2012-07-07 (×2): 20 mL via ORAL

## 2012-07-07 MED ORDER — POTASSIUM CHLORIDE IN NACL 40-0.9 MEQ/L-% IV SOLN
INTRAVENOUS | Status: DC
  Administered 2012-07-07 – 2012-07-08 (×3): via INTRAVENOUS
  Filled 2012-07-07 (×7): qty 1000

## 2012-07-07 MED ORDER — CIPROFLOXACIN IN D5W 400 MG/200ML IV SOLN
400.0000 mg | Freq: Two times a day (BID) | INTRAVENOUS | Status: DC
  Administered 2012-07-08 – 2012-07-09 (×3): 400 mg via INTRAVENOUS
  Filled 2012-07-07 (×4): qty 200

## 2012-07-07 MED ORDER — ONDANSETRON HCL 4 MG PO TABS: 4.0000 mg | ORAL_TABLET | Freq: Four times a day (QID) | ORAL | Status: DC | PRN

## 2012-07-07 MED ORDER — ACETAMINOPHEN 650 MG RE SUPP: 650.0000 mg | Freq: Four times a day (QID) | RECTAL | Status: DC | PRN

## 2012-07-07 MED ORDER — METRONIDAZOLE IN NACL 5-0.79 MG/ML-% IV SOLN
500.0000 mg | Freq: Once | INTRAVENOUS | Status: DC
  Administered 2012-07-07: 500 mg via INTRAVENOUS
  Filled 2012-07-07: qty 100

## 2012-07-07 MED ORDER — HYDROMORPHONE HCL PF 1 MG/ML IJ SOLN
1.0000 mg | Freq: Once | INTRAMUSCULAR | Status: AC
  Administered 2012-07-07: 1 mg via INTRAVENOUS
  Filled 2012-07-07: qty 1

## 2012-07-07 MED ORDER — ACETAMINOPHEN 325 MG PO TABS
650.0000 mg | ORAL_TABLET | Freq: Four times a day (QID) | ORAL | Status: DC | PRN
  Administered 2012-07-08: 650 mg via ORAL
  Filled 2012-07-07: qty 2
  Filled 2012-07-07: qty 1
  Filled 2012-07-07: qty 2

## 2012-07-07 MED ORDER — ONDANSETRON HCL 4 MG/2ML IJ SOLN: 4.0000 mg | Freq: Four times a day (QID) | INTRAMUSCULAR | Status: DC | PRN

## 2012-07-07 MED ORDER — MORPHINE SULFATE 4 MG/ML IJ SOLN
1.0000 mg | INTRAMUSCULAR | Status: DC | PRN
  Administered 2012-07-07 – 2012-07-09 (×3): 1 mg via INTRAVENOUS
  Filled 2012-07-07 (×3): qty 1

## 2012-07-07 MED ORDER — IOHEXOL 300 MG/ML  SOLN
80.0000 mL | Freq: Once | INTRAMUSCULAR | Status: AC | PRN
  Administered 2012-07-07: 80 mL via INTRAVENOUS

## 2012-07-07 MED ORDER — CIPROFLOXACIN IN D5W 400 MG/200ML IV SOLN
400.0000 mg | Freq: Once | INTRAVENOUS | Status: DC
  Administered 2012-07-07: 400 mg via INTRAVENOUS
  Filled 2012-07-07: qty 200

## 2012-07-07 MED ORDER — ONDANSETRON HCL 4 MG/2ML IJ SOLN
4.0000 mg | Freq: Once | INTRAMUSCULAR | Status: AC
  Administered 2012-07-07: 4 mg via INTRAVENOUS
  Filled 2012-07-07: qty 2

## 2012-07-07 MED ORDER — PNEUMOCOCCAL VAC POLYVALENT 25 MCG/0.5ML IJ INJ
0.5000 mL | INJECTION | INTRAMUSCULAR | Status: AC
  Administered 2012-07-08: 0.5 mL via INTRAMUSCULAR
  Filled 2012-07-07: qty 0.5

## 2012-07-07 MED ORDER — INFLUENZA VIRUS VACC SPLIT PF IM SUSP
0.5000 mL | INTRAMUSCULAR | Status: AC
  Administered 2012-07-08: 0.5 mL via INTRAMUSCULAR
  Filled 2012-07-07: qty 0.5

## 2012-07-07 MED ORDER — ENOXAPARIN SODIUM 40 MG/0.4ML ~~LOC~~ SOLN
40.0000 mg | SUBCUTANEOUS | Status: DC
  Administered 2012-07-07 – 2012-07-08 (×2): 40 mg via SUBCUTANEOUS
  Filled 2012-07-07 (×3): qty 0.4

## 2012-07-07 NOTE — ED Provider Notes (Signed)
Medical screening examination/treatment/procedure(s) were conducted as a shared visit with non-physician practitioner(s) and myself.  I personally evaluated the patient during the encounter CT of abdomen/pelvis shows sigmoid diverticulitis.  Call to Dr. Allyson Sabal to admit pt.  Mylinda Latina III, MD 07/07/12 [phone]

## 2012-07-07 NOTE — Progress Notes (Signed)
Utilization review completed.  P.J. Neven Fina,RN,BSN Case Manager [phone]  

## 2012-07-07 NOTE — ED Provider Notes (Signed)
Medical screening examination/treatment/procedure(s) were conducted as a shared visit with non-physician practitioner(s) and myself.  I personally evaluated the patient during the encounter 41 yo woman with LLQ pain, Hx Crohn's.  Lab workup essentlally normal.  Awaiting CT of abdomen and pelvis.  Moved to CDU to await abd/pelvic CT.   Mylinda Latina III, MD 07/07/12 [phone]

## 2012-07-07 NOTE — ED Notes (Signed)
Family at bedside. 

## 2012-07-07 NOTE — ED Provider Notes (Signed)
History     CSN: [id number]  Arrival date & time 07/07/12  5701   First MD Initiated Contact with Patient 07/07/12 1024      Chief Complaint  Patient presents with  . Rectal Bleeding  . Abdominal Pain    (Consider location/radiation/quality/duration/timing/severity/associated sxs/prior treatment) HPI Comments: This is a 41 year old female, who presents to the emergency department with chief complaint of abdominal pain and rectal bleeding. Past medical history remarkable for Crohn's disease and prior diverticulitis. She states that the abdominal pain started 3 days ago. The pain has worsened since then. She has not tried anything to alleviate her pain. She has associated diarrhea. Denies fever, nausea, vomiting, constipation. Patient has had a prior partial abdominal hysterectomy. No pain at McBurney's point, no peritoneal signs, or right upper quadrant tenderness.  The history is provided by the patient. No language interpreter was used.    Past Medical History  Diagnosis Date  . Diverticulitis   . Blood transfusion   . Anemia Dx: 2001 or so   . Sickle cell trait     Past Surgical History  Procedure Date  . Cesarean section 1997; 1997; 2001  . Abdominal hysterectomy     partial hysterectomy     Family History  Problem Relation Age of Onset  . Diabetes type II Mother   . Diabetes type II Father   . Stroke Father     History  Substance Use Topics  . Smoking status: Current Some Day Smoker -- 0.5 packs/day for 15 years    Types: Cigarettes    Last Attempt to Quit: 08/24/2011  . Smokeless tobacco: Never Used  . Alcohol Use: Yes     Comment: occasionally     OB History    Grav Para Term Preterm Abortions TAB SAB Ect Mult Living                  Review of Systems  All other systems reviewed and are negative.    Allergies  Review of patient's allergies indicates no known allergies.  Home Medications   Current Outpatient Rx  Name  Route  Sig  Dispense   Refill  . CALCIUM CARBONATE ANTACID 500 MG PO CHEW   Oral   Chew 2 tablets by mouth every 4 (four) hours as needed.           BP 135/90  Pulse 111  Temp 97.2 F (36.2 C) (Oral)  Resp 18  SpO2 99%  LMP 08/20/2011  Physical Exam  Nursing note and vitals reviewed. Constitutional: She is oriented to person, place, and time. She appears well-developed and well-nourished.  HENT:  Head: Normocephalic and atraumatic.  Eyes: Conjunctivae normal and EOM are normal. Pupils are equal, round, and reactive to light.  Neck: Normal range of motion. Neck supple.  Cardiovascular: Normal rate and regular rhythm.  Exam reveals no gallop and no friction rub.   No murmur heard. Pulmonary/Chest: Effort normal and breath sounds normal. No respiratory distress. She has no wheezes. She has no rales. She exhibits no tenderness.  Abdominal: Soft. Bowel sounds are normal. She exhibits no distension and no mass. There is no tenderness. There is no rebound and no guarding.       Pain to palpation of left lower quadrant, no McBurney point tenderness, no right upper quadrant tenderness, no peritoneal signs, no palpable masses or distention.  Musculoskeletal: Normal range of motion. She exhibits no edema and no tenderness.  Neurological: She is alert and oriented to  person, place, and time.  Skin: Skin is warm and dry.  Psychiatric: She has a normal mood and affect. Her behavior is normal. Judgment and thought content normal.    ED Course  Procedures (including critical care time)   Labs Reviewed  CBC WITH DIFFERENTIAL  BASIC METABOLIC PANEL   Results for orders placed during the hospital encounter of 07/07/12  CBC WITH DIFFERENTIAL      Component Value Range   WBC 8.4  4.0 - 10.5 K/uL   RBC 6.02 (*) 3.87 - 5.11 MIL/uL   Hemoglobin 13.8  12.0 - 15.0 g/dL   HCT 39.6  36.0 - 46.0 %   MCV 65.8 (*) 78.0 - 100.0 fL   MCH 22.9 (*) 26.0 - 34.0 pg   MCHC 34.8  30.0 - 36.0 g/dL   RDW 14.2  11.5 - 15.5 %    Platelets 186  150 - 400 K/uL   Neutrophils Relative 71  43 - 77 %   Lymphocytes Relative 20  12 - 46 %   Monocytes Relative 8  3 - 12 %   Eosinophils Relative 1  0 - 5 %   Basophils Relative 0  0 - 1 %   Neutro Abs 5.9  1.7 - 7.7 K/uL   Lymphs Abs 1.7  0.7 - 4.0 K/uL   Monocytes Absolute 0.7  0.1 - 1.0 K/uL   Eosinophils Absolute 0.1  0.0 - 0.7 K/uL   Basophils Absolute 0.0  0.0 - 0.1 K/uL   RBC Morphology TARGET CELLS     WBC Morphology ATYPICAL LYMPHOCYTES    BASIC METABOLIC PANEL      Component Value Range   Sodium 135  135 - 145 mEq/L   Potassium 3.1 (*) 3.5 - 5.1 mEq/L   Chloride 103  96 - 112 mEq/L   CO2 20  19 - 32 mEq/L   Glucose, Bld 103 (*) 70 - 99 mg/dL   BUN 5 (*) 6 - 23 mg/dL   Creatinine, Ser 0.64  0.50 - 1.10 mg/dL   Calcium 8.8  8.4 - 10.5 mg/dL   GFR calc non Af Amer >90  >90 mL/min   GFR calc Af Amer >90  >90 mL/min  URINALYSIS, ROUTINE W REFLEX MICROSCOPIC      Component Value Range   Color, Urine YELLOW  YELLOW   APPearance CLOUDY (*) CLEAR   Specific Gravity, Urine 1.018  1.005 - 1.030   pH 6.0  5.0 - 8.0   Glucose, UA NEGATIVE  NEGATIVE mg/dL   Hgb urine dipstick TRACE (*) NEGATIVE   Bilirubin Urine NEGATIVE  NEGATIVE   Ketones, ur 40 (*) NEGATIVE mg/dL   Protein, ur NEGATIVE  NEGATIVE mg/dL   Urobilinogen, UA 1.0  0.0 - 1.0 mg/dL   Nitrite NEGATIVE  NEGATIVE   Leukocytes, UA NEGATIVE  NEGATIVE  URINE MICROSCOPIC-ADD ON      Component Value Range   Squamous Epithelial / LPF MANY (*) RARE   WBC, UA 0-2  <3 WBC/hpf   RBC / HPF 0-2  <3 RBC/hpf   Bacteria, UA FEW (*) RARE   Ct Abdomen Pelvis W Contrast  07/07/2012  *RADIOLOGY REPORT*  Clinical Data: Lower abdominal pain and rectal bleeding  CT ABDOMEN AND PELVIS WITH CONTRAST  Technique:  Multidetector CT imaging of the abdomen and pelvis was performed following the standard protocol during bolus administration of intravenous contrast. Oral contrast was also administered.  Contrast: 58m OMNIPAQUE  IOHEXOL 300 MG/ML  SOLN  Comparison: August 25, 2011  Findings:  Lung bases are clear. There is a small hiatal hernia. No focal liver lesions are identified.  There is no biliary duct dilatation.  Spleen, pancreas, adrenals, and kidneys appear normal.  In the pelvis, there are sigmoid diverticula.  There is mesenteric inflammation in the mid pelvis extending toward the left immediately adjacent to the sigmoid, consistent with diverticulitis.  There is no abscess.  There is no pelvic mass.  The terminal ileum region currently appears normal.  There is no abnormal fluid in the pelvis. Appendix appears normal.  There is no bowel obstruction.  No free air or portal venous air. There is no ascites, adenopathy, there are abscess in the abdomen or pelvis.  Aorta is nonaneurysmal. There are no blastic or lytic bone lesions.  IMPRESSION: Findings consistent with sigmoid diverticulitis.  Note that the terminal ileum region appears normal.  There are no fistulae. There is no abscess.  There is no bowel obstruction.  Appendix appears normal.   Original Report Authenticated By: Lowella Grip, M.D.        1. Diverticulitis of sigmoid colon       MDM    10:43 AM Going to get labs, and CT abdomen/pelvis, I am suspicious of diverticulitis. Pain control with morphine and Zofran.  1:31 PM Ordering Cipro and Flagyl IV. Patient is in the CDU.  Patient will be admitted to the hospital.     Montine Circle, PA-C 07/07/12 [phone]

## 2012-07-07 NOTE — ED Notes (Addendum)
Pt c/o lower abd pain and bright red blood in stool. Pt stated that she has chron's disease and she thinks she had a flare up 3 days ago. Has not eaten in 3 days. Stated "when I pee it feels like my womb is going to fall out". Pt noted blood in her stool this morning.  Pt also stated that she has had diarrhea and goes 5-6 times a day. Denies N/V

## 2012-07-07 NOTE — H&P (Signed)
[facility] History and Physical  Amanda Garner DDU:[id number] DOB: 19-Mar-1971 DOA: 07/07/2012  Referring physician: PCP: Per Patient No Pcp   Chief Complaint: Rectal Bleeding  .  Abdominal Pain    HPI:  This is a 41 year old female, who presents to the emergency department with chief complaint of abdominal pain and rectal bleeding. Past medical history remarkable for Crohn's disease and prior diverticulitis. She states that the abdominal pain started 3 days ago. The pain has worsened since then. She has not tried anything to alleviate her pain. She has associated diarrhea.Has not eaten in 3 days. Stated "when I pee it feels like my womb is going to fall out".   Denies fever, nausea, vomiting, constipation. Patient has had a prior partial abdominal hysterectomy. No pain at McBurney's point, no peritoneal signs, or right upper quadrant tenderness.       Review of Systems: negative for the following  Constitutional: Denies fever, chills, diaphoresis, appetite change and fatigue.  HEENT: Denies photophobia, eye pain, redness, hearing loss, ear pain, congestion, sore throat, rhinorrhea, sneezing, mouth sores, trouble swallowing, neck pain, neck stiffness and tinnitus.  Respiratory: Denies SOB, DOE, cough, chest tightness, and wheezing.  Cardiovascular: Denies chest pain, palpitations and leg swelling.  Gastrointestinal: Denies nausea, vomiting, abdominal pain, diarrhea, constipation, blood in stool and abdominal distention.  Genitourinary: Denies dysuria, urgency, frequency, hematuria, flank pain and difficulty urinating.  Musculoskeletal: Denies myalgias, back pain, joint swelling, arthralgias and gait problem.  Skin: Denies pallor, rash and wound.  Neurological: Denies dizziness, seizures, syncope, weakness, light-headedness, numbness and headaches.  Hematological: Denies adenopathy. Easy bruising, personal or family bleeding history  Psychiatric/Behavioral: Denies suicidal  ideation, mood changes, confusion, nervousness, sleep disturbance and agitation       Past Medical History  Diagnosis Date  . Diverticulitis   . Blood transfusion   . Anemia Dx: 2001 or so   . Sickle cell trait      Past Surgical History  Procedure Date  . Cesarean section 1997; 1997; 2001  . Abdominal hysterectomy     partial hysterectomy       Social History:  reports that she has been smoking Cigarettes.  She has a 7.5 pack-year smoking history. She has never used smokeless tobacco. She reports that she drinks alcohol. She reports that she does not use illicit drugs.    No Known Allergies  Family History  Problem Relation Age of Onset  . Diabetes type II Mother   . Diabetes type II Father   . Stroke Father      Prior to Admission medications   Medication Sig Start Date End Date Taking? Authorizing Provider  calcium carbonate (TUMS - DOSED IN MG ELEMENTAL CALCIUM) 500 MG chewable tablet Chew 2 tablets by mouth every 4 (four) hours as needed.   Yes Historical Provider, MD     Physical Exam: Filed Vitals:   07/07/12 1003 07/07/12 1343  BP: 135/90 102/67  Pulse: 111 100  Temp: 97.2 F (36.2 C) 98.7 F (37.1 C)  TempSrc: Oral Oral  Resp: 18 16  SpO2: 99% 97%     Constitutional: Vital signs reviewed. Patient is a well-developed and well-nourished in no acute distress and cooperative with exam. Alert and oriented x3.  Head: Normocephalic and atraumatic  Ear: TM normal bilaterally  Mouth: no erythema or exudates, MMM  Eyes: PERRL, EOMI, conjunctivae normal, No scleral icterus.  Neck: Supple, Trachea midline normal ROM, No JVD, mass, thyromegaly, or carotid bruit present.  Cardiovascular: RRR, S1  normal, S2 normal, no MRG, pulses symmetric and intact bilaterally  Pulmonary/Chest: CTAB, no wheezes, rales, or rhonchi  Abdominal: Soft. Non-tender, non-distended, bowel sounds are normal, no masses, organomegaly, or guarding present.  GU: no CVA  tenderness Musculoskeletal: No joint deformities, erythema, or stiffness, ROM full and no nontender Ext: no edema and no cyanosis, pulses palpable bilaterally (DP and PT)  Hematology: no cervical, inginal, or axillary adenopathy.  Neurological: A&O x3, Strenght is normal and symmetric bilaterally, cranial nerve II-XII are grossly intact, no focal motor deficit, sensory intact to light touch bilaterally.  Skin: Warm, dry and intact. No rash, cyanosis, or clubbing.  Psychiatric: Normal mood and affect. speech and behavior is normal. Judgment and thought content normal. Cognition and memory are normal.       Labs on Admission:    Basic Metabolic Panel:  Lab 33/00/76 1034  NA 135  K 3.1*  CL 103  CO2 20  GLUCOSE 103*  BUN 5*  CREATININE 0.64  CALCIUM 8.8  MG --  PHOS --   Liver Function Tests: No results found for this basename: AST:5,ALT:5,ALKPHOS:5,BILITOT:5,PROT:5,ALBUMIN:5 in the last 168 hours No results found for this basename: LIPASE:5,AMYLASE:5 in the last 168 hours No results found for this basename: AMMONIA:5 in the last 168 hours CBC:  Lab 07/07/12 1034  WBC 8.4  NEUTROABS 5.9  HGB 13.8  HCT 39.6  MCV 65.8*  PLT 186   Cardiac Enzymes: No results found for this basename: CKTOTAL:5,CKMB:5,CKMBINDEX:5,TROPONINI:5 in the last 168 hours  BNP (last 3 results) No results found for this basename: PROBNP:3 in the last 8760 hours    CBG: No results found for this basename: GLUCAP:5 in the last 168 hours  Radiological Exams on Admission: Ct Abdomen Pelvis W Contrast  07/07/2012  *RADIOLOGY REPORT*  Clinical Data: Lower abdominal pain and rectal bleeding  CT ABDOMEN AND PELVIS WITH CONTRAST  Technique:  Multidetector CT imaging of the abdomen and pelvis was performed following the standard protocol during bolus administration of intravenous contrast. Oral contrast was also administered.  Contrast: 16m OMNIPAQUE IOHEXOL 300 MG/ML  SOLN  Comparison: August 25, 2011   Findings:  Lung bases are clear. There is a small hiatal hernia. No focal liver lesions are identified.  There is no biliary duct dilatation.  Spleen, pancreas, adrenals, and kidneys appear normal.  In the pelvis, there are sigmoid diverticula.  There is mesenteric inflammation in the mid pelvis extending toward the left immediately adjacent to the sigmoid, consistent with diverticulitis.  There is no abscess.  There is no pelvic mass.  The terminal ileum region currently appears normal.  There is no abnormal fluid in the pelvis. Appendix appears normal.  There is no bowel obstruction.  No free air or portal venous air. There is no ascites, adenopathy, there are abscess in the abdomen or pelvis.  Aorta is nonaneurysmal. There are no blastic or lytic bone lesions.  IMPRESSION: Findings consistent with sigmoid diverticulitis.  Note that the terminal ileum region appears normal.  There are no fistulae. There is no abscess.  There is no bowel obstruction.  Appendix appears normal.   Original Report Authenticated By: WLowella Grip M.D.     EKG:   Assessment/Plan   1. Acute diveticulitis, admit patient for IVF , IV ABX, CIPFO , FLAGYL, DIARRHEA and hemachezia , self limited , therefore c diff not being ordered , will order it if diarrhea returns. Was told she may have chron's but has never had a colonoscopy 2. Nicotine dependence ,  will counsel   Code Status:   full Family Communication: bedside Disposition Plan: admit   Time spent: 70 mins   [facility] Pager [phone]  If 7PM-7AM, please contact night-coverage [email or web address] Password [facility] 07/07/2012, 2:33 PM

## 2012-07-07 NOTE — ED Provider Notes (Signed)
Patient in CDU for observation while admission orders placed. CT abdomen pelvis remarkable for sigmoid diverticulitis. ABX started by Dr. Durward Fortes admit. Abdominal pain while in CDU, given pain medication for comfort.   Annalee Genta, PA-C 07/07/12 1415

## 2012-07-08 LAB — CBC
HCT: 35.6 % — ABNORMAL LOW (ref 36.0–46.0)
Hemoglobin: 12 g/dL (ref 12.0–15.0)
MCH: 22.4 pg — ABNORMAL LOW (ref 26.0–34.0)
MCV: 66.4 fL — ABNORMAL LOW (ref 78.0–100.0)
RBC: 5.36 MIL/uL — ABNORMAL HIGH (ref 3.87–5.11)

## 2012-07-08 LAB — BASIC METABOLIC PANEL
BUN: 5 mg/dL — ABNORMAL LOW (ref 6–23)
CO2: 20 mEq/L (ref 19–32)
Calcium: 8.2 mg/dL — ABNORMAL LOW (ref 8.4–10.5)
Glucose, Bld: 90 mg/dL (ref 70–99)
Sodium: 135 mEq/L (ref 135–145)

## 2012-07-08 NOTE — Progress Notes (Signed)
[facility] PROGRESS NOTE  Amanda Garner GHW:[id number] DOB: 1970/09/16 DOA: 07/07/2012 PCP: Per Patient No Pcp  Assessment/Plan:    1. Acute diveticulitis, continue  IVF , IV ABX, CIPFO , FLAGYL, DIARRHEA and hemachezia , self limited , therefore c diff not being ordered , will order it if diarrhea returns. Was told she may have chron's but has never had a colonoscopy. Advance to full liquids  2. Nicotine dependence ,will counsel  3.   Code Status: full Family Communication: family updated about patient's clinical progress Disposition Plan:  As above    Brief narrative: This is a 41 year old female, who presents to the emergency department with chief complaint of abdominal pain and rectal bleeding. Past medical history remarkable for Crohn's disease and prior diverticulitis. She states that the abdominal pain started 3 days ago. The pain has worsened since then. She has not tried anything to alleviate her pain. She has associated diarrhea.Has not eaten in 3 days. Stated "when I pee it feels like my womb is going to fall out".  Denies fever, nausea, vomiting, constipation. Patient has had a prior partial abdominal hysterectomy. No pain at McBurney's point, no peritoneal signs, or right upper quadrant tenderness.   Consultants:  none  Procedures:  none  Antibiotics:     HPI/Subjective: Abdominal Pain improving    Objective: Filed Vitals:   07/07/12 1538 07/07/12 1729 07/07/12 2159 07/08/12 0601  BP: 119/82 120/80 105/54 99/55  Pulse: 93 85 90 81  Temp:  98.3 F (36.8 C) 98.6 F (37 C) 99.3 F (37.4 C)  TempSrc:  Oral Oral Oral  Resp: 20 20 18 18   Height:  5' 6"  (1.676 m)    Weight:  84.6 kg (186 lb 8.2 oz)    SpO2: 100% 99% 98% 100%   No intake or output data in the 24 hours ending 07/08/12 0935  Exam:  HENT:  Head: Atraumatic.  Nose: Nose normal.  Mouth/Throat: Oropharynx is clear and moist.  Eyes: Conjunctivae are normal. Pupils are equal,  round, and reactive to light. No scleral icterus.  Neck: Neck supple. No tracheal deviation present.  Cardiovascular: Normal rate, regular rhythm, normal heart sounds and intact distal pulses.  Pulmonary/Chest: Effort normal and breath sounds normal. No respiratory distress.  Abdominal: Soft. Normal appearance and bowel sounds are normal. She exhibits no distension. There is no tenderness.  Musculoskeletal: She exhibits no edema and no tenderness.  Neurological: She is alert. No cranial nerve deficit.    Data Reviewed: Basic Metabolic Panel:  Lab 78/93/81 0705 07/07/12 1836 07/07/12 1034  NA 135 -- 135  K 3.8 -- 3.1*  CL 105 -- 103  CO2 20 -- 20  GLUCOSE 90 -- 103*  BUN 5* -- 5*  CREATININE 0.56 0.64 0.64  CALCIUM 8.2* -- 8.8  MG -- -- --  PHOS -- -- --    Liver Function Tests:  Lab 07/07/12 1836  AST 13  ALT 7  ALKPHOS 59  BILITOT 0.4  PROT 6.8  ALBUMIN 3.3*   No results found for this basename: LIPASE:5,AMYLASE:5 in the last 168 hours No results found for this basename: AMMONIA:5 in the last 168 hours  CBC:  Lab 07/08/12 0705 07/07/12 1836 07/07/12 1034  WBC 6.7 7.2 8.4  NEUTROABS -- -- 5.9  HGB 12.0 12.3 13.8  HCT 35.6* 35.8* 39.6  MCV 66.4* 65.4* 65.8*  PLT 198 166 186    Cardiac Enzymes: No results found for this basename: CKTOTAL:5,CKMB:5,CKMBINDEX:5,TROPONINI:5 in the last 168  hours BNP (last 3 results) No results found for this basename: PROBNP:3 in the last 8760 hours   CBG: No results found for this basename: GLUCAP:5 in the last 168 hours  No results found for this or any previous visit (from the past 240 hour(s)).   Studies: Ct Abdomen Pelvis W Contrast  07/07/2012  *RADIOLOGY REPORT*  Clinical Data: Lower abdominal pain and rectal bleeding  CT ABDOMEN AND PELVIS WITH CONTRAST  Technique:  Multidetector CT imaging of the abdomen and pelvis was performed following the standard protocol during bolus administration of intravenous contrast. Oral  contrast was also administered.  Contrast: 75m OMNIPAQUE IOHEXOL 300 MG/ML  SOLN  Comparison: August 25, 2011  Findings:  Lung bases are clear. There is a small hiatal hernia. No focal liver lesions are identified.  There is no biliary duct dilatation.  Spleen, pancreas, adrenals, and kidneys appear normal.  In the pelvis, there are sigmoid diverticula.  There is mesenteric inflammation in the mid pelvis extending toward the left immediately adjacent to the sigmoid, consistent with diverticulitis.  There is no abscess.  There is no pelvic mass.  The terminal ileum region currently appears normal.  There is no abnormal fluid in the pelvis. Appendix appears normal.  There is no bowel obstruction.  No free air or portal venous air. There is no ascites, adenopathy, there are abscess in the abdomen or pelvis.  Aorta is nonaneurysmal. There are no blastic or lytic bone lesions.  IMPRESSION: Findings consistent with sigmoid diverticulitis.  Note that the terminal ileum region appears normal.  There are no fistulae. There is no abscess.  There is no bowel obstruction.  Appendix appears normal.   Original Report Authenticated By: WLowella Grip M.D.     Scheduled Meds:   . ciprofloxacin  400 mg Intravenous Q12H  . enoxaparin (LOVENOX) injection  40 mg Subcutaneous Q24H  . [COMPLETED]  HYDROmorphone (DILAUDID) injection  1 mg Intravenous Once  . influenza  inactive virus vaccine  0.5 mL Intramuscular Tomorrow-1000  . [COMPLETED] iohexol  20 mL Oral Q1 Hr x 2  . metronidazole  500 mg Intravenous Q8H  . [COMPLETED] morphine  4 mg Intravenous Once  . [COMPLETED] morphine  4 mg Intravenous Once  . [COMPLETED]  morphine injection  4 mg Intravenous Once  . [COMPLETED] ondansetron (ZOFRAN) IV  4 mg Intravenous Once  . pneumococcal 23 valent vaccine  0.5 mL Intramuscular Tomorrow-1000  . [COMPLETED] ciprofloxacin  400 mg Intravenous Once  . [COMPLETED] metronidazole  500 mg Intravenous Once   Continuous  Infusions:   . 0.9 % NaCl with KCl 40 mEq / L 100 mL/hr at 07/08/12 09211   Active Problems:  * No active hospital problems. *     Time spent: 40 minutes   [facility] Pager [phone] If 8PM-8AM, please contact night-coverage at [email or web address], password [facility]11/16/2013, 9:35 AM  LOS: 1 day

## 2012-07-09 LAB — BASIC METABOLIC PANEL
CO2: 23 mEq/L (ref 19–32)
GFR calc non Af Amer: >90 mL/min (ref >90)
Glucose, Bld: 128 mg/dL — ABNORMAL HIGH (ref 70–99)
Potassium: 3.9 mEq/L (ref 3.5–5.1)
Sodium: 139 mEq/L (ref 135–145)

## 2012-07-09 MED ORDER — HYDROCODONE-ACETAMINOPHEN 5-500 MG PO TABS: 1.0000 | ORAL_TABLET | Freq: Four times a day (QID) | ORAL | Status: DC | PRN

## 2012-07-09 MED ORDER — METRONIDAZOLE 500 MG PO TABS: 500.0000 mg | ORAL_TABLET | Freq: Three times a day (TID) | ORAL | Status: AC

## 2012-07-09 MED ORDER — CIPROFLOXACIN HCL 500 MG PO TABS: 500.0000 mg | ORAL_TABLET | Freq: Two times a day (BID) | ORAL | Status: AC

## 2012-07-09 NOTE — Progress Notes (Signed)
Discharge instructions/Med Rec Sheet reviewed w/ pt. Pt expressed understanding and copies given w/ prescriptions. Pt d/c'd in stable condition via w/c, accompanied by NT

## 2012-07-09 NOTE — Discharge Summary (Signed)
Physician Discharge Summary  Amanda Garner MRN: [id number] DOB/AGE: February 02, 1971 41 y.o.  PCP: Per Patient No Pcp   Admit date: 07/07/2012 Discharge date: 07/09/2012  Discharge Diagnoses:  Acute diverticulitis Dehydration Probable Crohn's disease     Medication List     As of 07/09/2012  9:54 AM    TAKE these medications         calcium carbonate 500 MG chewable tablet   Commonly known as: TUMS - dosed in mg elemental calcium   Chew 2 tablets by mouth every 4 (four) hours as needed.      ciprofloxacin 500 MG tablet   Commonly known as: CIPRO   Take 1 tablet (500 mg total) by mouth 2 (two) times daily.      HYDROcodone-acetaminophen 5-500 MG per tablet   Commonly known as: VICODIN   Take 1 tablet by mouth every 6 (six) hours as needed for pain.      metroNIDAZOLE 500 MG tablet   Commonly known as: FLAGYL   Take 1 tablet (500 mg total) by mouth 3 (three) times daily.        Discharge Condition: Stable  Disposition: 01-Home or Self Care   Consults:  #1  Significant Diagnostic Studies: Ct Abdomen Pelvis W Contrast  07/07/2012  *RADIOLOGY REPORT*  Clinical Data: Lower abdominal pain and rectal bleeding  CT ABDOMEN AND PELVIS WITH CONTRAST  Technique:  Multidetector CT imaging of the abdomen and pelvis was performed following the standard protocol during bolus administration of intravenous contrast. Oral contrast was also administered.  Contrast: 80m OMNIPAQUE IOHEXOL 300 MG/ML  SOLN  Comparison: August 25, 2011  Findings:  Lung bases are clear. There is a small hiatal hernia. No focal liver lesions are identified.  There is no biliary duct dilatation.  Spleen, pancreas, adrenals, and kidneys appear normal.  In the pelvis, there are sigmoid diverticula.  There is mesenteric inflammation in the mid pelvis extending toward the left immediately adjacent to the sigmoid, consistent with diverticulitis.  There is no abscess.  There is no pelvic mass.  The terminal  ileum region currently appears normal.  There is no abnormal fluid in the pelvis. Appendix appears normal.  There is no bowel obstruction.  No free air or portal venous air. There is no ascites, adenopathy, there are abscess in the abdomen or pelvis.  Aorta is nonaneurysmal. There are no blastic or lytic bone lesions.  IMPRESSION: Findings consistent with sigmoid diverticulitis.  Note that the terminal ileum region appears normal.  There are no fistulae. There is no abscess.  There is no bowel obstruction.  Appendix appears normal.   Original Report Authenticated By: WLowella Grip M.D.       Microbiology: No results found for this or any previous visit (from the past 240 hour(s)).   Labs: Results for orders placed during the hospital encounter of 07/07/12 (from the past 48 hour(s))  CBC WITH DIFFERENTIAL     Status: Abnormal   Collection Time   07/07/12 10:34 AM      Component Value Range Comment   WBC 8.4  4.0 - 10.5 K/uL    RBC 6.02 (*) 3.87 - 5.11 MIL/uL    Hemoglobin 13.8  12.0 - 15.0 g/dL    HCT 39.6  36.0 - 46.0 %    MCV 65.8 (*) 78.0 - 100.0 fL    MCH 22.9 (*) 26.0 - 34.0 pg    MCHC 34.8  30.0 - 36.0 g/dL    RDW  14.2  11.5 - 15.5 %    Platelets 186  150 - 400 K/uL    Neutrophils Relative 71  43 - 77 %    Lymphocytes Relative 20  12 - 46 %    Monocytes Relative 8  3 - 12 %    Eosinophils Relative 1  0 - 5 %    Basophils Relative 0  0 - 1 %    Neutro Abs 5.9  1.7 - 7.7 K/uL    Lymphs Abs 1.7  0.7 - 4.0 K/uL    Monocytes Absolute 0.7  0.1 - 1.0 K/uL    Eosinophils Absolute 0.1  0.0 - 0.7 K/uL    Basophils Absolute 0.0  0.0 - 0.1 K/uL    RBC Morphology TARGET CELLS      WBC Morphology ATYPICAL LYMPHOCYTES     BASIC METABOLIC PANEL     Status: Abnormal   Collection Time   07/07/12 10:34 AM      Component Value Range Comment   Sodium 135  135 - 145 mEq/L    Potassium 3.1 (*) 3.5 - 5.1 mEq/L    Chloride 103  96 - 112 mEq/L    CO2 20  19 - 32 mEq/L    Glucose, Bld 103  (*) 70 - 99 mg/dL    BUN 5 (*) 6 - 23 mg/dL    Creatinine, Ser 0.64  0.50 - 1.10 mg/dL    Calcium 8.8  8.4 - 10.5 mg/dL    GFR calc non Af Amer >90  >90 mL/min    GFR calc Af Amer >90  >90 mL/min   URINALYSIS, ROUTINE W REFLEX MICROSCOPIC     Status: Abnormal   Collection Time   07/07/12 11:18 AM      Component Value Range Comment   Color, Urine YELLOW  YELLOW    APPearance CLOUDY (*) CLEAR    Specific Gravity, Urine 1.018  1.005 - 1.030    pH 6.0  5.0 - 8.0    Glucose, UA NEGATIVE  NEGATIVE mg/dL    Hgb urine dipstick TRACE (*) NEGATIVE    Bilirubin Urine NEGATIVE  NEGATIVE    Ketones, ur 40 (*) NEGATIVE mg/dL    Protein, ur NEGATIVE  NEGATIVE mg/dL    Urobilinogen, UA 1.0  0.0 - 1.0 mg/dL    Nitrite NEGATIVE  NEGATIVE    Leukocytes, UA NEGATIVE  NEGATIVE   URINE MICROSCOPIC-ADD ON     Status: Abnormal   Collection Time   07/07/12 11:18 AM      Component Value Range Comment   Squamous Epithelial / LPF MANY (*) RARE    WBC, UA 0-2  <3 WBC/hpf    RBC / HPF 0-2  <3 RBC/hpf    Bacteria, UA FEW (*) RARE   CBC     Status: Abnormal   Collection Time   07/07/12  6:36 PM      Component Value Range Comment   WBC 7.2  4.0 - 10.5 K/uL    RBC 5.47 (*) 3.87 - 5.11 MIL/uL    Hemoglobin 12.3  12.0 - 15.0 g/dL    HCT 35.8 (*) 36.0 - 46.0 %    MCV 65.4 (*) 78.0 - 100.0 fL    MCH 22.5 (*) 26.0 - 34.0 pg    MCHC 34.4  30.0 - 36.0 g/dL    RDW 14.1  11.5 - 15.5 %    Platelets 166  150 - 400 K/uL   CREATININE, SERUM  Status: Normal   Collection Time   07/07/12  6:36 PM      Component Value Range Comment   Creatinine, Ser 0.64  0.50 - 1.10 mg/dL    GFR calc non Af Amer >90  >90 mL/min    GFR calc Af Amer >90  >90 mL/min   HEPATIC FUNCTION PANEL     Status: Abnormal   Collection Time   07/07/12  6:36 PM      Component Value Range Comment   Total Protein 6.8  6.0 - 8.3 g/dL    Albumin 3.3 (*) 3.5 - 5.2 g/dL    AST 13  0 - 37 U/L    ALT 7  0 - 35 U/L    Alkaline Phosphatase 59  39  - 117 U/L    Total Bilirubin 0.4  0.3 - 1.2 mg/dL    Bilirubin, Direct 0.1  0.0 - 0.3 mg/dL    Indirect Bilirubin 0.3  0.3 - 0.9 mg/dL   CBC     Status: Abnormal   Collection Time   07/08/12  7:05 AM      Component Value Range Comment   WBC 6.7  4.0 - 10.5 K/uL    RBC 5.36 (*) 3.87 - 5.11 MIL/uL    Hemoglobin 12.0  12.0 - 15.0 g/dL    HCT 35.6 (*) 36.0 - 46.0 %    MCV 66.4 (*) 78.0 - 100.0 fL    MCH 22.4 (*) 26.0 - 34.0 pg    MCHC 33.7  30.0 - 36.0 g/dL    RDW 14.2  11.5 - 15.5 %    Platelets 198  150 - 400 K/uL   BASIC METABOLIC PANEL     Status: Abnormal   Collection Time   07/08/12  7:05 AM      Component Value Range Comment   Sodium 135  135 - 145 mEq/L    Potassium 3.8  3.5 - 5.1 mEq/L DELTA CHECK NOTED   Chloride 105  96 - 112 mEq/L    CO2 20  19 - 32 mEq/L    Glucose, Bld 90  70 - 99 mg/dL    BUN 5 (*) 6 - 23 mg/dL    Creatinine, Ser 0.56  0.50 - 1.10 mg/dL    Calcium 8.2 (*) 8.4 - 10.5 mg/dL    GFR calc non Af Amer >90  >90 mL/min    GFR calc Af Amer >90  >90 mL/min   BASIC METABOLIC PANEL     Status: Abnormal   Collection Time   07/09/12  4:48 AM      Component Value Range Comment   Sodium 139  135 - 145 mEq/L    Potassium 3.9  3.5 - 5.1 mEq/L    Chloride 108  96 - 112 mEq/L    CO2 23  19 - 32 mEq/L    Glucose, Bld 128 (*) 70 - 99 mg/dL    BUN 3 (*) 6 - 23 mg/dL    Creatinine, Ser 0.63  0.50 - 1.10 mg/dL    Calcium 8.7  8.4 - 10.5 mg/dL    GFR calc non Af Amer >90  >90 mL/min    GFR calc Af Amer >90  >90 mL/min      HPI : This is a 41 year old female, who presents to the emergency department with chief complaint of abdominal pain and rectal bleeding. Past medical history remarkable for Crohn's disease and prior diverticulitis. She states that the abdominal pain started  3 days ago. The pain has worsened since then. She has not tried anything to alleviate her pain. She has associated diarrhea.Has not eaten in 3 days. CT scan in the ED showed sigmoid  diverticulitis Denies fever, nausea, vomiting, constipation. Patient has had a prior partial abdominal hysterectomy. No pain at McBurney's point, no peritoneal signs, or right upper quadrant tenderness  HOSPITAL COURSE:  #1 sigmoid diverticulitis Patient was at admitted for IV hydration, IV antibiotics She was initiated on ciprofloxacin and Flagyl Initially kept on clear liquid diet which was slowly advanced to a regular diet prior to discharge Currently she is pain-free and is being discharged home She will need a followup outpatient colonoscopy and she also had some hematochezia which was self-limited  Hemoglobin remained stable during the course of this hospitalization    Discharge Exam:  Blood pressure 112/67, pulse 73, temperature 97.9 F (36.6 C), temperature source Oral, resp. rate 16, height 5' 6"  (1.676 m), weight 84.6 kg (186 lb 8.2 oz), last menstrual period 08/20/2011, SpO2 96.00%.   Head: Normocephalic and atraumatic.  Eyes: Conjunctivae normal and EOM are normal. Pupils are equal, round, and reactive to light.  Neck: Normal range of motion. Neck supple.  Cardiovascular: Normal rate and regular rhythm. Exam reveals no gallop and no friction rub.  No murmur heard.  Pulmonary/Chest: Effort normal and breath sounds normal. No respiratory distress. She has no wheezes. She has no rales. She exhibits no tenderness.  Abdominal: Soft. Bowel sounds are normal. She exhibits no distension and no mass. There is no tenderness. There is no rebound and no guarding.  Pain to palpation of left lower quadrant, no McBurney point tenderness, no right upper quadrant tenderness, no peritoneal signs, no palpable masses or distention.  Musculoskeletal: Normal range of motion. She exhibits no edema and no tenderness.  Neurological: She is alert and oriented to person, place, and time.  Skin: Skin is warm and dry.  Psychiatric: She has a normal mood and affect. Her behavior is normal. Judgment and  thought content normal       Signed: Allison Deshotels 07/09/2012, 9:54 AM

## 2012-09-06 ENCOUNTER — Encounter (HOSPITAL_COMMUNITY): Payer: Self-pay | Admitting: *Deleted

## 2012-09-06 ENCOUNTER — Inpatient Hospital Stay (HOSPITAL_COMMUNITY)
Admission: EM | Admit: 2012-09-06 | Discharge: 2012-09-09 | DRG: 392 | Disposition: A | Discharge status: Home / Self Care | Payer: Medicaid Other | Attending: Internal Medicine | Admitting: Internal Medicine

## 2012-09-06 DIAGNOSIS — K5732 Diverticulitis of large intestine without perforation or abscess without bleeding: Principal | ICD-10-CM | POA: Diagnosis present

## 2012-09-06 DIAGNOSIS — R109 Unspecified abdominal pain: Secondary | ICD-10-CM

## 2012-09-06 DIAGNOSIS — K5792 Diverticulitis of intestine, part unspecified, without perforation or abscess without bleeding: Secondary | ICD-10-CM

## 2012-09-06 DIAGNOSIS — R933 Abnormal findings on diagnostic imaging of other parts of digestive tract: Secondary | ICD-10-CM

## 2012-09-06 DIAGNOSIS — F17200 Nicotine dependence, unspecified, uncomplicated: Secondary | ICD-10-CM | POA: Diagnosis present

## 2012-09-06 DIAGNOSIS — Z79899 Other long term (current) drug therapy: Secondary | ICD-10-CM

## 2012-09-06 DIAGNOSIS — D573 Sickle-cell trait: Secondary | ICD-10-CM | POA: Diagnosis present

## 2012-09-06 DIAGNOSIS — K529 Noninfective gastroenteritis and colitis, unspecified: Secondary | ICD-10-CM

## 2012-09-06 DIAGNOSIS — A0472 Enterocolitis due to Clostridium difficile, not specified as recurrent: Secondary | ICD-10-CM

## 2012-09-06 DIAGNOSIS — Z90710 Acquired absence of both cervix and uterus: Secondary | ICD-10-CM

## 2012-09-06 DIAGNOSIS — R197 Diarrhea, unspecified: Secondary | ICD-10-CM

## 2012-09-06 LAB — COMPREHENSIVE METABOLIC PANEL
ALT: 19 U/L (ref 0–35)
AST: 24 U/L (ref 0–37)
Alkaline Phosphatase: 59 U/L (ref 39–117)
CO2: 23 mEq/L (ref 19–32)
Chloride: 105 mEq/L (ref 96–112)
GFR calc Af Amer: >90 mL/min (ref >90)
GFR calc non Af Amer: >90 mL/min (ref >90)
Glucose, Bld: 101 mg/dL — ABNORMAL HIGH (ref 70–99)
Potassium: 3.6 mEq/L (ref 3.5–5.1)
Sodium: 143 mEq/L (ref 135–145)
Total Bilirubin: 0.3 mg/dL (ref 0.3–1.2)

## 2012-09-06 LAB — CBC WITH DIFFERENTIAL/PLATELET
Basophils Relative: 0 % (ref 0–1)
Eosinophils Relative: 2 % (ref 0–5)
Hemoglobin: 14.1 g/dL (ref 12.0–15.0)
Lymphocytes Relative: 28 % (ref 12–46)
MCH: 22.7 pg — ABNORMAL LOW (ref 26.0–34.0)
Monocytes Absolute: 0.5 10*3/uL (ref 0.1–1.0)
Monocytes Relative: 6 % (ref 3–12)
Neutrophils Relative %: 64 % (ref 43–77)
RBC: 6.21 MIL/uL — ABNORMAL HIGH (ref 3.87–5.11)
WBC: 9.1 10*3/uL (ref 4.0–10.5)

## 2012-09-06 MED ORDER — ONDANSETRON HCL 4 MG/2ML IJ SOLN
4.0000 mg | Freq: Once | INTRAMUSCULAR | Status: AC
  Administered 2012-09-06: 4 mg via INTRAVENOUS
  Filled 2012-09-06: qty 2

## 2012-09-06 NOTE — ED Notes (Signed)
Pt c/o sudden onset of intense abdominal pain x 2 hours.  Was hospitalized in December for diverticulosis.

## 2012-09-06 NOTE — ED Notes (Signed)
Pt unable to urinate in triage

## 2012-09-07 ENCOUNTER — Encounter (HOSPITAL_COMMUNITY): Payer: Self-pay | Admitting: Radiology

## 2012-09-07 ENCOUNTER — Emergency Department (HOSPITAL_COMMUNITY): Payer: Medicaid Other

## 2012-09-07 DIAGNOSIS — R197 Diarrhea, unspecified: Secondary | ICD-10-CM

## 2012-09-07 DIAGNOSIS — K5792 Diverticulitis of intestine, part unspecified, without perforation or abscess without bleeding: Secondary | ICD-10-CM | POA: Diagnosis present

## 2012-09-07 DIAGNOSIS — R6889 Other general symptoms and signs: Secondary | ICD-10-CM

## 2012-09-07 DIAGNOSIS — K5732 Diverticulitis of large intestine without perforation or abscess without bleeding: Principal | ICD-10-CM

## 2012-09-07 DIAGNOSIS — R933 Abnormal findings on diagnostic imaging of other parts of digestive tract: Secondary | ICD-10-CM

## 2012-09-07 LAB — URINALYSIS, MICROSCOPIC ONLY
Bilirubin Urine: NEGATIVE
Glucose, UA: NEGATIVE mg/dL
Hgb urine dipstick: NEGATIVE
Protein, ur: NEGATIVE mg/dL
Urobilinogen, UA: 1 mg/dL (ref 0.0–1.0)

## 2012-09-07 LAB — CBC
HCT: 38.8 % (ref 36.0–46.0)
Hemoglobin: 13.1 g/dL (ref 12.0–15.0)
MCHC: 33.8 g/dL (ref 30.0–36.0)
RBC: 5.83 MIL/uL — ABNORMAL HIGH (ref 3.87–5.11)

## 2012-09-07 LAB — POCT PREGNANCY, URINE: Preg Test, Ur: NEGATIVE

## 2012-09-07 LAB — CREATININE, SERUM
Creatinine, Ser: 0.6 mg/dL (ref 0.50–1.10)
GFR calc Af Amer: >90 mL/min (ref >90)
GFR calc non Af Amer: >90 mL/min (ref >90)

## 2012-09-07 MED ORDER — CIPROFLOXACIN IN D5W 400 MG/200ML IV SOLN: 400.0000 mg | Freq: Once | INTRAVENOUS | Status: DC

## 2012-09-07 MED ORDER — ONDANSETRON HCL 4 MG/2ML IJ SOLN: 4.0000 mg | Freq: Four times a day (QID) | INTRAMUSCULAR | Status: DC | PRN

## 2012-09-07 MED ORDER — IOHEXOL 300 MG/ML  SOLN
50.0000 mL | Freq: Once | INTRAMUSCULAR | Status: AC | PRN
  Administered 2012-09-07: 50 mL via ORAL

## 2012-09-07 MED ORDER — KCL IN DEXTROSE-NACL 10-5-0.45 MEQ/L-%-% IV SOLN
INTRAVENOUS | Status: DC
  Administered 2012-09-07 – 2012-09-09 (×3): via INTRAVENOUS
  Filled 2012-09-07 (×6): qty 1000

## 2012-09-07 MED ORDER — ONDANSETRON HCL 4 MG PO TABS: 4.0000 mg | ORAL_TABLET | Freq: Four times a day (QID) | ORAL | Status: DC | PRN

## 2012-09-07 MED ORDER — IOHEXOL 300 MG/ML  SOLN
100.0000 mL | Freq: Once | INTRAMUSCULAR | Status: AC | PRN
  Administered 2012-09-07: 100 mL via INTRAVENOUS

## 2012-09-07 MED ORDER — METRONIDAZOLE IN NACL 5-0.79 MG/ML-% IV SOLN
500.0000 mg | Freq: Once | INTRAVENOUS | Status: AC
  Administered 2012-09-07: 500 mg via INTRAVENOUS
  Filled 2012-09-07: qty 100

## 2012-09-07 MED ORDER — CIPROFLOXACIN IN D5W 400 MG/200ML IV SOLN
400.0000 mg | Freq: Two times a day (BID) | INTRAVENOUS | Status: DC
  Administered 2012-09-07 – 2012-09-09 (×5): 400 mg via INTRAVENOUS
  Filled 2012-09-07 (×6): qty 200

## 2012-09-07 MED ORDER — MORPHINE SULFATE 4 MG/ML IJ SOLN
4.0000 mg | Freq: Once | INTRAMUSCULAR | Status: AC
  Administered 2012-09-07: 4 mg via INTRAVENOUS
  Filled 2012-09-07: qty 1

## 2012-09-07 MED ORDER — METOCLOPRAMIDE HCL 5 MG/ML IJ SOLN
10.0000 mg | Freq: Once | INTRAMUSCULAR | Status: AC
  Administered 2012-09-07: 10 mg via INTRAVENOUS
  Filled 2012-09-07: qty 2

## 2012-09-07 MED ORDER — MORPHINE SULFATE 2 MG/ML IJ SOLN
1.0000 mg | INTRAMUSCULAR | Status: DC | PRN
  Administered 2012-09-07 (×2): 1 mg via INTRAVENOUS
  Filled 2012-09-07 (×2): qty 1

## 2012-09-07 MED ORDER — KETOROLAC TROMETHAMINE 30 MG/ML IJ SOLN
30.0000 mg | Freq: Once | INTRAMUSCULAR | Status: AC
  Administered 2012-09-07: 30 mg via INTRAVENOUS
  Filled 2012-09-07: qty 1

## 2012-09-07 MED ORDER — ENOXAPARIN SODIUM 40 MG/0.4ML ~~LOC~~ SOLN
40.0000 mg | Freq: Every day | SUBCUTANEOUS | Status: DC
  Administered 2012-09-07 – 2012-09-09 (×3): 40 mg via SUBCUTANEOUS
  Filled 2012-09-07 (×3): qty 0.4

## 2012-09-07 MED ORDER — PANTOPRAZOLE SODIUM 40 MG IV SOLR
40.0000 mg | INTRAVENOUS | Status: DC
  Administered 2012-09-07 – 2012-09-08 (×2): 40 mg via INTRAVENOUS
  Filled 2012-09-07 (×3): qty 40

## 2012-09-07 MED ORDER — ONDANSETRON HCL 4 MG/2ML IJ SOLN
4.0000 mg | Freq: Once | INTRAMUSCULAR | Status: AC
  Administered 2012-09-07: 4 mg via INTRAVENOUS
  Filled 2012-09-07: qty 2

## 2012-09-07 MED ORDER — METRONIDAZOLE IN NACL 5-0.79 MG/ML-% IV SOLN
500.0000 mg | Freq: Three times a day (TID) | INTRAVENOUS | Status: DC
  Administered 2012-09-07 – 2012-09-09 (×7): 500 mg via INTRAVENOUS
  Filled 2012-09-07 (×9): qty 100

## 2012-09-07 NOTE — H&P (Signed)
[facility]                                                                                    Patient Demographics  Amanda Garner, is a 42 y.o. female  CSN: [id number]  MRN: [id number]  DOB - 12-25-1970  Admit Date - 09/06/2012  Outpatient Primary MD for the patient is Per Patient No Pcp   With History of -  Past Medical History  Diagnosis Date  . Diverticulitis   . Blood transfusion   . Anemia Dx: 2001 or so   . Sickle cell trait       Past Surgical History  Procedure Date  . Cesarean section 1997; 1997; 2001  . Abdominal hysterectomy     partial hysterectomy     in for   Chief Complaint  Patient presents with  . Abdominal Pain     HPI  Amanda Garner  is a 42 y.o. female, with past medical history significant for ileitis and C. difficile colitis, presenting with 2 days history of left lower quadrant pain with nausea vomiting diarrhea and fever. Patient was supposed to have a GI workup after her last discharge to rule out Crohn's disease but due to financial problems, could not do it. She denies any blood in her stools or in her vomitus. She reports chills at home that started today    Review of Systems    In addition to the HPI above,  No Fever-chills, No Headache, No changes with Vision or hearing, No problems swallowing food or Liquids, No Chest pain, Cough or Shortness of Breath, No Blood in stool or Urine, No dysuria, No new skin rashes or bruises, No new joints pains-aches,  No new weakness, tingling, numbness in any extremity, No recent weight gain or loss, No polyuria, polydypsia or polyphagia, No significant Mental Stressors.  A full 10 point Review of Systems was done, except as stated above, all other Review of Systems were negative.   Social History History  Substance Use Topics  . Smoking status: Current Some Day Smoker -- 0.5 packs/day for 15 years    Types: Cigarettes    Last Attempt to Quit: 08/24/2011  .  Smokeless tobacco: Never Used  . Alcohol Use: Yes     Comment: occasionally      Family History Family History  Problem Relation Age of Onset  . Diabetes type II Mother   . Diabetes type II Father   . Stroke Father      Prior to Admission medications   Medication Sig Start Date End Date Taking? Authorizing Provider  aluminum & magnesium hydroxide-simethicone (MYLANTA) 500-450-40 MG/5ML suspension Take 15 mLs by mouth every 6 (six) hours as needed. For indigestion   Yes Historical Provider, MD  calcium carbonate (TUMS - DOSED IN MG ELEMENTAL CALCIUM) 500 MG chewable tablet Chew 2 tablets by mouth every 4 (four) hours as needed. For indigestion   Yes Historical Provider, MD    No Known Allergies  Physical Exam  Vitals  Blood pressure 125/84, pulse 85, temperature 98.4 F (36.9 C), temperature source Oral, resp. rate 18, last menstrual period 08/20/2011, SpO2 99.00%.   1. General  young [place] American female in pain  2. Normal affect and insight, Not Suicidal or Homicidal, Awake Alert, Oriented X 3.  3. No F.N deficits, ALL C.Nerves Intact, Strength 5/5 all 4 extremities, Sensation intact all 4 extremities, Plantars down going.  4. Ears and Eyes appear Normal, Conjunctivae clear, PERRLA. Moist Oral Mucosa.  5. Supple Neck, No JVD, No cervical lymphadenopathy appriciated, No Carotid Bruits.  6. Symmetrical Chest wall movement, Good air movement bilaterally, CTAB.  7. RRR, No Gallops, Rubs or Murmurs, No Parasternal Heave.  8. Positive Bowel Sounds but decreased, Abdomen significant tenderness noted of the left lower quadrant.  9.  No Cyanosis, Normal Skin Turgor, No Skin Rash or Bruise.  10. Good muscle tone,  joints appear normal , no effusions, Normal ROM.  11. No Palpable Lymph Nodes in Neck or Axillae    Data Review  CBC  Lab 09/06/12 2220  WBC 9.1  HGB 14.1  HCT 41.0  PLT 261  MCV 66.0*  MCH 22.7*  MCHC 34.4  RDW 14.8  LYMPHSABS 2.5  MONOABS 0.5    EOSABS 0.2  BASOSABS 0.0  BANDABS --   ------------------------------------------------------------------------------------------------------------------  Chemistries   Lab 09/06/12 2220  NA 143  K 3.6  CL 105  CO2 23  GLUCOSE 101*  BUN 12  CREATININE 0.65  CALCIUM 10.1  MG --  AST 24  ALT 19  ALKPHOS 59  BILITOT 0.3   - Urinalysis    Component Value Date/Time   COLORURINE YELLOW 09/07/2012 0019   APPEARANCEUR CLEAR 09/07/2012 0019   LABSPEC 1.033* 09/07/2012 0019   PHURINE 7.0 09/07/2012 0019   GLUCOSEU NEGATIVE 09/07/2012 0019   HGBUR NEGATIVE 09/07/2012 0019   BILIRUBINUR NEGATIVE 09/07/2012 0019   KETONESUR 15* 09/07/2012 0019   PROTEINUR NEGATIVE 09/07/2012 0019   UROBILINOGEN 1.0 09/07/2012 0019   NITRITE NEGATIVE 09/07/2012 0019   LEUKOCYTESUR NEGATIVE 09/07/2012 0019    ----------------------------------------------------------------------------------------------------------------     Imaging results:   Ct Abdomen Pelvis W Contrast  09/07/2012  *RADIOLOGY REPORT*  Clinical Data: Sudden onset of intense abdominal pain for 2 hours.  CT ABDOMEN AND PELVIS WITH CONTRAST  Technique:  Multidetector CT imaging of the abdomen and pelvis was performed following the standard protocol during bolus administration of intravenous contrast.  Contrast: 144m OMNIPAQUE IOHEXOL 300 MG/ML  SOLN  Comparison: 07/07/2012  Findings: Slight fibrosis or dependent atelectasis in the lung bases.  Small esophageal hiatal hernia.  The liver, spleen, gallbladder, pancreas, adrenal glands, kidneys, abdominal aorta, and retroperitoneal lymph nodes are unremarkable. The stomach, small bowel, and colon are not abnormally distended. Small umbilical and left periumbilical hernias containing fat. These are stable since the previous study.  No free air or free fluid in the abdomen.  Pelvis:  Diverticulosis of the sigmoid colon.  Inflammatory infiltration around the sigmoid colon with minimal pericolonic  fluid consistent with a diverticulitis.  No drainable abscess collection is identified.  The uterus and adnexal structures are not enlarged.  The bladder is decompressed.  The appendix is normal.  No significant pelvic lymphadenopathy.  Degenerative changes in the lumbar spine.  IMPRESSION: Inflammatory changes consistent with diverticulitis in the distal sigmoid colon.  Diverticulosis.  Fat containing umbilical and periumbilical hernias.   Original Report Authenticated By: WLucienne Capers M.D.      Assessment & Plan  1. Diverticulitis with intractable nausea and vomiting 2. history of ileitis treated in the past. Patient could not have any further workup because of financial problems.  Plan  IV Cipro and Flagyl Keep n.p.o. IV fluids   DVT Prophylaxis Lovenox  AM Labs Ordered, also please review Full Orders  Family Communication: Admission, patients condition and plan of care including tests being ordered have been discussed with the patient and mother who indicate understanding and agree with the plan and Code Status.  Code Status full  Disposition Plan: Admit to med surge then discharged home in stable  Time spent in minutes : 35 minutes  Condition fair

## 2012-09-07 NOTE — ED Notes (Signed)
Report called to 6N

## 2012-09-07 NOTE — ED Notes (Signed)
Family at bedside. 

## 2012-09-07 NOTE — ED Notes (Signed)
Pt ambulating to bathroom for urine sample.

## 2012-09-07 NOTE — ED Notes (Signed)
CT notified that pt vomited after drinking 1 1/2 cups of contrast.  Pt attempting to drink remaining 1/2 cup per CT.

## 2012-09-07 NOTE — Progress Notes (Signed)
Pateint admitted early this AM by Dr. Laren Everts.   A/P: 1. Diverticulitis: cipro/flagyl, h/o c diff, plan to r/o- will need outpatient colonoscopy 2. N/V- symptomatic treatment, IVF 3. No PCP- case management consult  Eulogio Bear DO

## 2012-09-07 NOTE — ED Provider Notes (Signed)
History     CSN: [id number]  Arrival date & time 09/06/12  2158   First MD Initiated Contact with Patient 09/06/12 2301      Chief Complaint  Patient presents with  . Abdominal Pain    (Consider location/radiation/quality/duration/timing/severity/associated sxs/prior treatment) Patient is a 42 y.o. female presenting with abdominal pain.  Abdominal Pain The primary symptoms of the illness include abdominal pain and nausea. The current episode started 1 to 2 hours ago. The onset of the illness was gradual. The problem has not changed since onset. Additional symptoms associated with the illness include constipation.    Past Medical History  Diagnosis Date  . Diverticulitis   . Blood transfusion   . Anemia Dx: 2001 or so   . Sickle cell trait     Past Surgical History  Procedure Date  . Cesarean section 1997; 1997; 2001  . Abdominal hysterectomy     partial hysterectomy     Family History  Problem Relation Age of Onset  . Diabetes type II Mother   . Diabetes type II Father   . Stroke Father     History  Substance Use Topics  . Smoking status: Current Some Day Smoker -- 0.5 packs/day for 15 years    Types: Cigarettes    Last Attempt to Quit: 08/24/2011  . Smokeless tobacco: Never Used  . Alcohol Use: Yes     Comment: occasionally     OB History    Grav Para Term Preterm Abortions TAB SAB Ect Mult Living                  Review of Systems  Gastrointestinal: Positive for nausea, abdominal pain and constipation.  All other systems reviewed and are negative.    Allergies  Review of patient's allergies indicates no known allergies.  Home Medications   Current Outpatient Rx  Name  Route  Sig  Dispense  Refill  . ALUM & MAG HYDROXIDE-SIMETH 500-450-40 MG/5ML PO SUSP   Oral   Take 15 mLs by mouth every 6 (six) hours as needed. For indigestion         . CALCIUM CARBONATE ANTACID 500 MG PO CHEW   Oral   Chew 2 tablets by mouth every 4 (four) hours as  needed. For indigestion           BP 156/105  Pulse 106  Temp 98.4 F (36.9 C) (Oral)  Resp 18  SpO2 97%  LMP 08/20/2011  Physical Exam  Constitutional: She is oriented to person, place, and time. She appears well-developed and well-nourished.  HENT:  Head: Normocephalic and atraumatic.  Eyes: Conjunctivae normal and EOM are normal. Pupils are equal, round, and reactive to light.  Neck: Normal range of motion.  Cardiovascular: Normal rate, regular rhythm and normal heart sounds.   Pulmonary/Chest: Effort normal and breath sounds normal.  Abdominal: Soft. Bowel sounds are normal.    Musculoskeletal: Normal range of motion.  Neurological: She is alert and oriented to person, place, and time.  Skin: Skin is warm and dry.  Psychiatric: She has a normal mood and affect. Her behavior is normal.    ED Course  Procedures (including critical care time)  Labs Reviewed  CBC WITH DIFFERENTIAL - Abnormal; Notable for the following:    RBC 6.21 (*)     MCV 66.0 (*)     MCH 22.7 (*)     All other components within normal limits  COMPREHENSIVE METABOLIC PANEL - Abnormal; Notable for the  following:    Glucose, Bld 101 (*)     All other components within normal limits  LIPASE, BLOOD  POCT PREGNANCY, URINE  URINALYSIS, MICROSCOPIC ONLY   No results found.   No diagnosis found.    MDM  + diffuse abd pain,  Constipation x 2 days,  Recent hx of diverticulitis.  Benign labs.  Will analgesia, ct,  reassess        Aaron Edelman, MD 09/07/12 0100

## 2012-09-08 DIAGNOSIS — A0472 Enterocolitis due to Clostridium difficile, not specified as recurrent: Secondary | ICD-10-CM

## 2012-09-08 LAB — BASIC METABOLIC PANEL
BUN: 7 mg/dL (ref 6–23)
CO2: 23 mEq/L (ref 19–32)
Calcium: 8.5 mg/dL (ref 8.4–10.5)
Creatinine, Ser: 0.64 mg/dL (ref 0.50–1.10)
GFR calc non Af Amer: >90 mL/min (ref >90)
Glucose, Bld: 104 mg/dL — ABNORMAL HIGH (ref 70–99)
Sodium: 137 mEq/L (ref 135–145)

## 2012-09-08 LAB — CBC
MCH: 22.6 pg — ABNORMAL LOW (ref 26.0–34.0)
MCHC: 34 g/dL (ref 30.0–36.0)
MCV: 66.5 fL — ABNORMAL LOW (ref 78.0–100.0)
Platelets: 200 10*3/uL (ref 150–400)
RBC: 5.4 MIL/uL — ABNORMAL HIGH (ref 3.87–5.11)
RDW: 14.7 % (ref 11.5–15.5)

## 2012-09-08 LAB — CLOSTRIDIUM DIFFICILE BY PCR: Toxigenic C. Difficile by PCR: NEGATIVE

## 2012-09-08 MED ORDER — CALCIUM CARBONATE ANTACID 500 MG PO CHEW
1.0000 | CHEWABLE_TABLET | Freq: Two times a day (BID) | ORAL | Status: DC | PRN
  Filled 2012-09-08: qty 1

## 2012-09-08 MED ORDER — WHITE PETROLATUM GEL
Status: AC
  Filled 2012-09-08: qty 5

## 2012-09-08 NOTE — Progress Notes (Signed)
Patient c/o constant headache for a few days that will not go away. Physician on call notified and orders given for Toradol. Patient informed and Toradol given. Will continue to monitor.

## 2012-09-08 NOTE — Progress Notes (Signed)
[facility] PROGRESS NOTE  Amanda Garner OTR:[id number] DOB: 19-Jun-1971 DOA: 09/06/2012 PCP: Per Patient No Pcp  Assessment/Plan: 1. Diverticulitis: cipro/flagyl, h/o c diff, negative on recheck- will need outpatient colonoscopy, advance diet (has been in previously but has been unable to follow up with GI due to financial reasons)  2. N/V- symptomatic treatment, IVF   3. No PCP/insurance- case management consult   Code Status: full Family Communication: called mother per patient request Disposition Plan: home tomm   Consultants:    Procedures:    Antibiotics: Cipro/flagyl  HPI/Subjective: Patient hungry Normal BM  Objective: Filed Vitals:   09/07/12 1404 09/07/12 1835 09/07/12 2159 09/08/12 0625  BP: 134/80 138/81 119/75 129/76  Pulse: 94 91 80 82  Temp: 99.2 F (37.3 C) 99.2 F (37.3 C) 99.5 F (37.5 C) 98.4 F (36.9 C)  TempSrc: Oral Oral Oral   Resp: 18 18 17 18   Height:      Weight:      SpO2: 100% 100% 98% 99%    Intake/Output Summary (Last 24 hours) at 09/08/12 1248 Last data filed at 09/08/12 0800  Gross per 24 hour  Intake 1591.25 ml  Output      1 ml  Net 1590.25 ml   Filed Weights   09/07/12 0851  Weight: 83.915 kg (185 lb)    Exam:   General:  A+Ox3, NAD  Cardiovascular: rrr  Respiratory: clear anterior  Abdomen: +BS, soft, NT/ND  Data Reviewed: Basic Metabolic Panel:  Lab 83/33/83 0540 09/07/12 0905 09/06/12 2220  NA 137 -- 143  K 3.3* -- 3.6  CL 105 -- 105  CO2 23 -- 23  GLUCOSE 104* -- 101*  BUN 7 -- 12  CREATININE 0.64 0.60 0.65  CALCIUM 8.5 -- 10.1  MG -- -- --  PHOS -- -- --   Liver Function Tests:  Lab 09/06/12 2220  AST 24  ALT 19  ALKPHOS 59  BILITOT 0.3  PROT 8.1  ALBUMIN 4.1    Lab 09/06/12 2220  LIPASE 24  AMYLASE --   No results found for this basename: AMMONIA:5 in the last 168 hours CBC:  Lab 09/08/12 0540 09/07/12 0905 09/06/12 2220  WBC 4.1 6.1 9.1  NEUTROABS -- -- 5.9    HGB 12.2 13.1 14.1  HCT 35.9* 38.8 41.0  MCV 66.5* 66.6* 66.0*  PLT 200 213 261   Cardiac Enzymes: No results found for this basename: CKTOTAL:5,CKMB:5,CKMBINDEX:5,TROPONINI:5 in the last 168 hours BNP (last 3 results) No results found for this basename: PROBNP:3 in the last 8760 hours CBG: No results found for this basename: GLUCAP:5 in the last 168 hours  Recent Results (from the past 240 hour(s))  CLOSTRIDIUM DIFFICILE BY PCR     Status: Normal   Collection Time   09/08/12  8:35 AM      Component Value Range Status Comment   C difficile by pcr NEGATIVE  NEGATIVE Final      Studies: Ct Abdomen Pelvis W Contrast  09/07/2012  *RADIOLOGY REPORT*  Clinical Data: Sudden onset of intense abdominal pain for 2 hours.  CT ABDOMEN AND PELVIS WITH CONTRAST  Technique:  Multidetector CT imaging of the abdomen and pelvis was performed following the standard protocol during bolus administration of intravenous contrast.  Contrast: 166m OMNIPAQUE IOHEXOL 300 MG/ML  SOLN  Comparison: 07/07/2012  Findings: Slight fibrosis or dependent atelectasis in the lung bases.  Small esophageal hiatal hernia.  The liver, spleen, gallbladder, pancreas, adrenal glands, kidneys, abdominal aorta, and retroperitoneal  lymph nodes are unremarkable. The stomach, small bowel, and colon are not abnormally distended. Small umbilical and left periumbilical hernias containing fat. These are stable since the previous study.  No free air or free fluid in the abdomen.  Pelvis:  Diverticulosis of the sigmoid colon.  Inflammatory infiltration around the sigmoid colon with minimal pericolonic fluid consistent with a diverticulitis.  No drainable abscess collection is identified.  The uterus and adnexal structures are not enlarged.  The bladder is decompressed.  The appendix is normal.  No significant pelvic lymphadenopathy.  Degenerative changes in the lumbar spine.  IMPRESSION: Inflammatory changes consistent with diverticulitis in the  distal sigmoid colon.  Diverticulosis.  Fat containing umbilical and periumbilical hernias.   Original Report Authenticated By: Lucienne Capers, M.D.     Scheduled Meds:   . ciprofloxacin  400 mg Intravenous Once  . ciprofloxacin  400 mg Intravenous Q12H  . enoxaparin (LOVENOX) injection  40 mg Subcutaneous Daily  . metronidazole  500 mg Intravenous Q8H  . pantoprazole (PROTONIX) IV  40 mg Intravenous Q24H  . white petrolatum       Continuous Infusions:   . dextrose 5 % and 0.45 % NaCl with KCl 10 mEq/L 75 mL/hr at 09/08/12 0544    Principal Problem:  *Diverticulitis    Time spent: [place] Pager [phone]. If 8PM-8AM, please contact night-coverage at [email or web address], password [facility] 09/08/2012, 12:48 PM  LOS: 2 days

## 2012-09-08 NOTE — Care Management Note (Signed)
  Page 1 of 1   09/08/2012     1:59:53 PM   CARE MANAGEMENT NOTE 09/08/2012  Patient:  Amanda Garner, Amanda Garner   Account Number:  [id number]  Date Initiated:  09/07/2012  Documentation initiated by:  Magdalen Spatz  Subjective/Objective Assessment:   Diverticulitis with intractable nausea and vomiting     Action/Plan:   IV Cipro and Flagyl  Keep n.p.o.  IV fluids   Anticipated DC Date:     Anticipated DC Plan:           Choice offered to / List presented to:             Status of service:  In process, will continue to follow Medicare Important Message given?   (If response is "NO", the following Medicare IM given date fields will be blank) Date Medicare IM given:   Date Additional Medicare IM given:    Discharge Disposition:    Per UR Regulation:  Reviewed for med. necessity/level of care/duration of stay  If discussed at [place] of Stay Meetings, dates discussed:    Comments:  09-08-12 Consult for PCP . Gave patient list of Primary Care Resources , instructed her to call to make appointment. Patient wanted case manager to "sign her up " for [facility]. Explained she would have to do this herself. She stated she would do so on line  Magdalen Spatz RN BSN [phone]

## 2012-09-09 LAB — FECAL LACTOFERRIN, QUANT

## 2012-09-09 MED ORDER — METRONIDAZOLE 500 MG PO TABS: 500.0000 mg | ORAL_TABLET | Freq: Three times a day (TID) | ORAL | Status: DC

## 2012-09-09 MED ORDER — CIPROFLOXACIN HCL 500 MG PO TABS: 500.0000 mg | ORAL_TABLET | Freq: Two times a day (BID) | ORAL | Status: DC

## 2012-09-09 MED ORDER — UNABLE TO FIND: Status: DC

## 2012-09-09 MED ORDER — PANTOPRAZOLE SODIUM 40 MG PO TBEC
40.0000 mg | DELAYED_RELEASE_TABLET | Freq: Every day | ORAL | Status: DC
  Administered 2012-09-09: 40 mg via ORAL
  Filled 2012-09-09: qty 1

## 2012-09-09 NOTE — Progress Notes (Signed)
[facility] PROGRESS NOTE  Amanda Garner UUE:[id number] DOB: 1971/07/13 DOA: 09/06/2012 PCP: Per Patient No Pcp  Assessment/Plan: Diverticulitis Continue cipro/flagyl, h/o c diff, negative on recheck. Will need outpatient colonoscopy. (Unable to follow up with GI in the past due to financial reasons)  Nausea and vomiting Resolved. Likely due to diverticulitis.   No PCP/insurance Case management on 09/08/2012 met with the patient and provided the patient with appropriate resources.  Code Status: Full Family Communication: No family at bedside. Disposition Plan: Home today.   Consultants:  None  Procedures:  None  Antibiotics: Cipro/flagyl 09/07/2012  HPI/Subjective: No specific concerns. Denies any abdominal pain.  Objective: Filed Vitals:   09/08/12 1430 09/08/12 2110 09/09/12 0512 09/09/12 0615  BP: 120/84 134/67 99/54 118/78  Pulse: 81 77 66   Temp: 97.6 F (36.4 C) 98.1 F (36.7 C) 98.3 F (36.8 C)   TempSrc: Oral     Resp: 19 18 18    Height:      Weight:      SpO2: 100% 100% 100%     Intake/Output Summary (Last 24 hours) at 09/09/12 1452 Last data filed at 09/09/12 0657  Gross per 24 hour  Intake 1498.75 ml  Output      0 ml  Net 1498.75 ml   Filed Weights   09/07/12 0851  Weight: 83.915 kg (185 lb)    Exam: Physical Exam: General: Awake, Oriented, No acute distress. HEENT: EOMI. Neck: Supple CV: S1 and S2 Lungs: Clear to ascultation bilaterally Abdomen: Soft, Nontender, Nondistended, +bowel sounds. Ext: Good pulses. Trace edema.  Data Reviewed: Basic Metabolic Panel:  Lab 91/50/56 0540 09/07/12 0905 09/06/12 2220  NA 137 -- 143  K 3.3* -- 3.6  CL 105 -- 105  CO2 23 -- 23  GLUCOSE 104* -- 101*  BUN 7 -- 12  CREATININE 0.64 0.60 0.65  CALCIUM 8.5 -- 10.1  MG -- -- --  PHOS -- -- --   Liver Function Tests:  Lab 09/06/12 2220  AST 24  ALT 19  ALKPHOS 59  BILITOT 0.3  PROT 8.1  ALBUMIN 4.1    Lab 09/06/12 2220    LIPASE 24  AMYLASE --   No results found for this basename: AMMONIA:5 in the last 168 hours CBC:  Lab 09/08/12 0540 09/07/12 0905 09/06/12 2220  WBC 4.1 6.1 9.1  NEUTROABS -- -- 5.9  HGB 12.2 13.1 14.1  HCT 35.9* 38.8 41.0  MCV 66.5* 66.6* 66.0*  PLT 200 213 261   Cardiac Enzymes: No results found for this basename: CKTOTAL:5,CKMB:5,CKMBINDEX:5,TROPONINI:5 in the last 168 hours BNP (last 3 results) No results found for this basename: PROBNP:3 in the last 8760 hours CBG: No results found for this basename: GLUCAP:5 in the last 168 hours  Recent Results (from the past 240 hour(s))  CLOSTRIDIUM DIFFICILE BY PCR     Status: Normal   Collection Time   09/08/12  8:35 AM      Component Value Range Status Comment   C difficile by pcr NEGATIVE  NEGATIVE Final      Studies: No results found.  Scheduled Meds:    . ciprofloxacin  400 mg Intravenous Once  . ciprofloxacin  400 mg Intravenous Q12H  . enoxaparin (LOVENOX) injection  40 mg Subcutaneous Daily  . metronidazole  500 mg Intravenous Q8H  . pantoprazole  40 mg Oral Daily   Continuous Infusions:    . dextrose 5 % and 0.45 % NaCl with KCl 10 mEq/L 75 mL/hr at  09/09/12 0002    Principal Problem:  *Diverticulitis   Amanda Garner A  [facility] Pager [phone]. If 7PM-7AM, please contact night-coverage at [email or web address], password [facility] 09/09/2012, 2:52 PM  LOS: 3 days

## 2012-09-09 NOTE — Discharge Summary (Signed)
Physician Discharge Summary  [name] LPN:[id number] DOB: [birth year] DOA: 09/06/2012  PCP: Per Patient No Pcp  Admit date: 09/06/2012 Discharge date: 09/09/2012  Time spent: 25 minutes  Recommendations for Outpatient Follow-up:  1. Followup with  (include homehealth, outpatient follow-up instructions, specific recommendations for PCP to follow-up on, etc.)  Discharge Diagnoses:  Principal Problem:  *Diverticulitis  Discharge Condition: Stable  Diet recommendation: Heart healthy diet.  Filed Weights   09/07/12 0851  Weight: 83.915 kg (185 lb)    History of present illness:  Amanda Garner is a 42 y.o. female, with past medical history significant for ileitis and C. difficile colitis, presenting with 2 days history of left lower quadrant pain with nausea vomiting diarrhea and fever on 09/07/2012.  Hospital Course:  Diverticulitis  Intially started on IV cipro/flagyl. Patient improved with supportive care and antibiotics. Transition to oral antibiotics at discharge. H/o c diff, negative on recheck. Will need outpatient colonoscopy. (Unable to follow up with GI in the past due to financial reasons).  Nausea and vomiting  Resolved. Likely due to diverticulitis.   No PCP/insurance  Case management on 09/08/2012 met with the patient and provided the patient with appropriate resources.   Code Status: Full  Family Communication: No family at bedside.  Disposition Plan: Home today.   Consultants:  None Procedures:  None Antibiotics:  Cipro/flagyl 09/07/2012 >> define 10 more days to complete 12 day course.  Discharge Exam: Filed Vitals:   09/08/12 1430 09/08/12 2110 09/09/12 0512 09/09/12 0615  BP: 120/84 134/67 99/54 118/78  Pulse: 81 77 66   Temp: 97.6 F (36.4 C) 98.1 F (36.7 C) 98.3 F (36.8 C)   TempSrc: Oral     Resp: 19 18 18    Height:      Weight:      SpO2: 100% 100% 100%    Discharge Instructions  Discharge Orders    Future Orders Please  Complete By Expires   Diet - low sodium heart healthy      Increase activity slowly      Discharge instructions      Comments:   Please followup with primary care physician in 2-3 weeks. Please discuss with primary care physician about a referral to GI for consideration of a colonoscopy.       Medication List     As of 09/09/2012  2:59 PM    TAKE these medications         aluminum & magnesium hydroxide-simethicone 500-450-40 MG/5ML suspension   Commonly known as: MYLANTA   Take 15 mLs by mouth every 6 (six) hours as needed. For indigestion      calcium carbonate 500 MG chewable tablet   Commonly known as: TUMS - dosed in mg elemental calcium   Chew 2 tablets by mouth every 4 (four) hours as needed. For indigestion      ciprofloxacin 500 MG tablet   Commonly known as: CIPRO   Take 1 tablet (500 mg total) by mouth 2 (two) times daily.      metroNIDAZOLE 500 MG tablet   Commonly known as: FLAGYL   Take 1 tablet (500 mg total) by mouth 3 (three) times daily.           Follow-up Information    Follow up with PCP. Schedule an appointment as soon as possible for a visit in 2 weeks. (Will need a GI referral for colonscopy)           The results of significant diagnostics from  this hospitalization (including imaging, microbiology, ancillary and laboratory) are listed below for reference.    Significant Diagnostic Studies: Ct Abdomen Pelvis W Contrast  09/07/2012  *RADIOLOGY REPORT*  Clinical Data: Sudden onset of intense abdominal pain for 2 hours.  CT ABDOMEN AND PELVIS WITH CONTRAST  Technique:  Multidetector CT imaging of the abdomen and pelvis was performed following the standard protocol during bolus administration of intravenous contrast.  Contrast: 176m OMNIPAQUE IOHEXOL 300 MG/ML  SOLN  Comparison: 07/07/2012  Findings: Slight fibrosis or dependent atelectasis in the lung bases.  Small esophageal hiatal hernia.  The liver, spleen, gallbladder, pancreas, adrenal glands,  kidneys, abdominal aorta, and retroperitoneal lymph nodes are unremarkable. The stomach, small bowel, and colon are not abnormally distended. Small umbilical and left periumbilical hernias containing fat. These are stable since the previous study.  No free air or free fluid in the abdomen.  Pelvis:  Diverticulosis of the sigmoid colon.  Inflammatory infiltration around the sigmoid colon with minimal pericolonic fluid consistent with a diverticulitis.  No drainable abscess collection is identified.  The uterus and adnexal structures are not enlarged.  The bladder is decompressed.  The appendix is normal.  No significant pelvic lymphadenopathy.  Degenerative changes in the lumbar spine.  IMPRESSION: Inflammatory changes consistent with diverticulitis in the distal sigmoid colon.  Diverticulosis.  Fat containing umbilical and periumbilical hernias.   Original Report Authenticated By: WLucienne Capers M.D.     Microbiology: Recent Results (from the past 240 hour(s))  CLOSTRIDIUM DIFFICILE BY PCR     Status: Normal   Collection Time   09/08/12  8:35 AM      Component Value Range Status Comment   C difficile by pcr NEGATIVE  NEGATIVE Final      Labs: Basic Metabolic Panel:  Lab [id number] 09/07/12 0905 09/06/12 2220  NA 137 -- 143  K 3.3* -- 3.6  CL 105 -- 105  CO2 23 -- 23  GLUCOSE 104* -- 101*  BUN 7 -- 12  CREATININE 0.64 0.60 0.65  CALCIUM 8.5 -- 10.1  MG -- -- --  PHOS -- -- --   Liver Function Tests:  Lab 09/06/12 2220  AST 24  ALT 19  ALKPHOS 59  BILITOT 0.3  PROT 8.1  ALBUMIN 4.1    Lab 09/06/12 2220  LIPASE 24  AMYLASE --   No results found for this basename: AMMONIA:5 in the last 168 hours CBC:  Lab 09/08/12 0540 09/07/12 0905 09/06/12 2220  WBC 4.1 6.1 9.1  NEUTROABS -- -- 5.9  HGB 12.2 13.1 14.1  HCT 35.9* 38.8 41.0  MCV 66.5* 66.6* 66.0*  PLT 200 213 261   Cardiac Enzymes: No results found for this basename: CKTOTAL:5,CKMB:5,CKMBINDEX:5,TROPONINI:5 in  the last 168 hours BNP: BNP (last 3 results) No results found for this basename: PROBNP:3 in the last 8760 hours CBG: No results found for this basename: GLUCAP:5 in the last 168 hours   Signed:  Shamyah Stantz A  [facility] 09/09/2012, 2:59 PM

## 2012-09-09 NOTE — Progress Notes (Signed)
Patient ready for discharge.  The patient was given discharge instructions and same were explained to the patient.  The patient voiced understanding.  Patient discharged to home with friend.  Darnelle Bos, RN

## 2012-12-13 IMAGING — CR DG ABDOMEN 2V
2 series · 2 of 2 positions shown · non-contrast
Comparison: 08/25/2011 CT

CLINICAL DATA: Abdominal pain.

ABDOMEN - 2 VIEW

[w abdomen upright *]
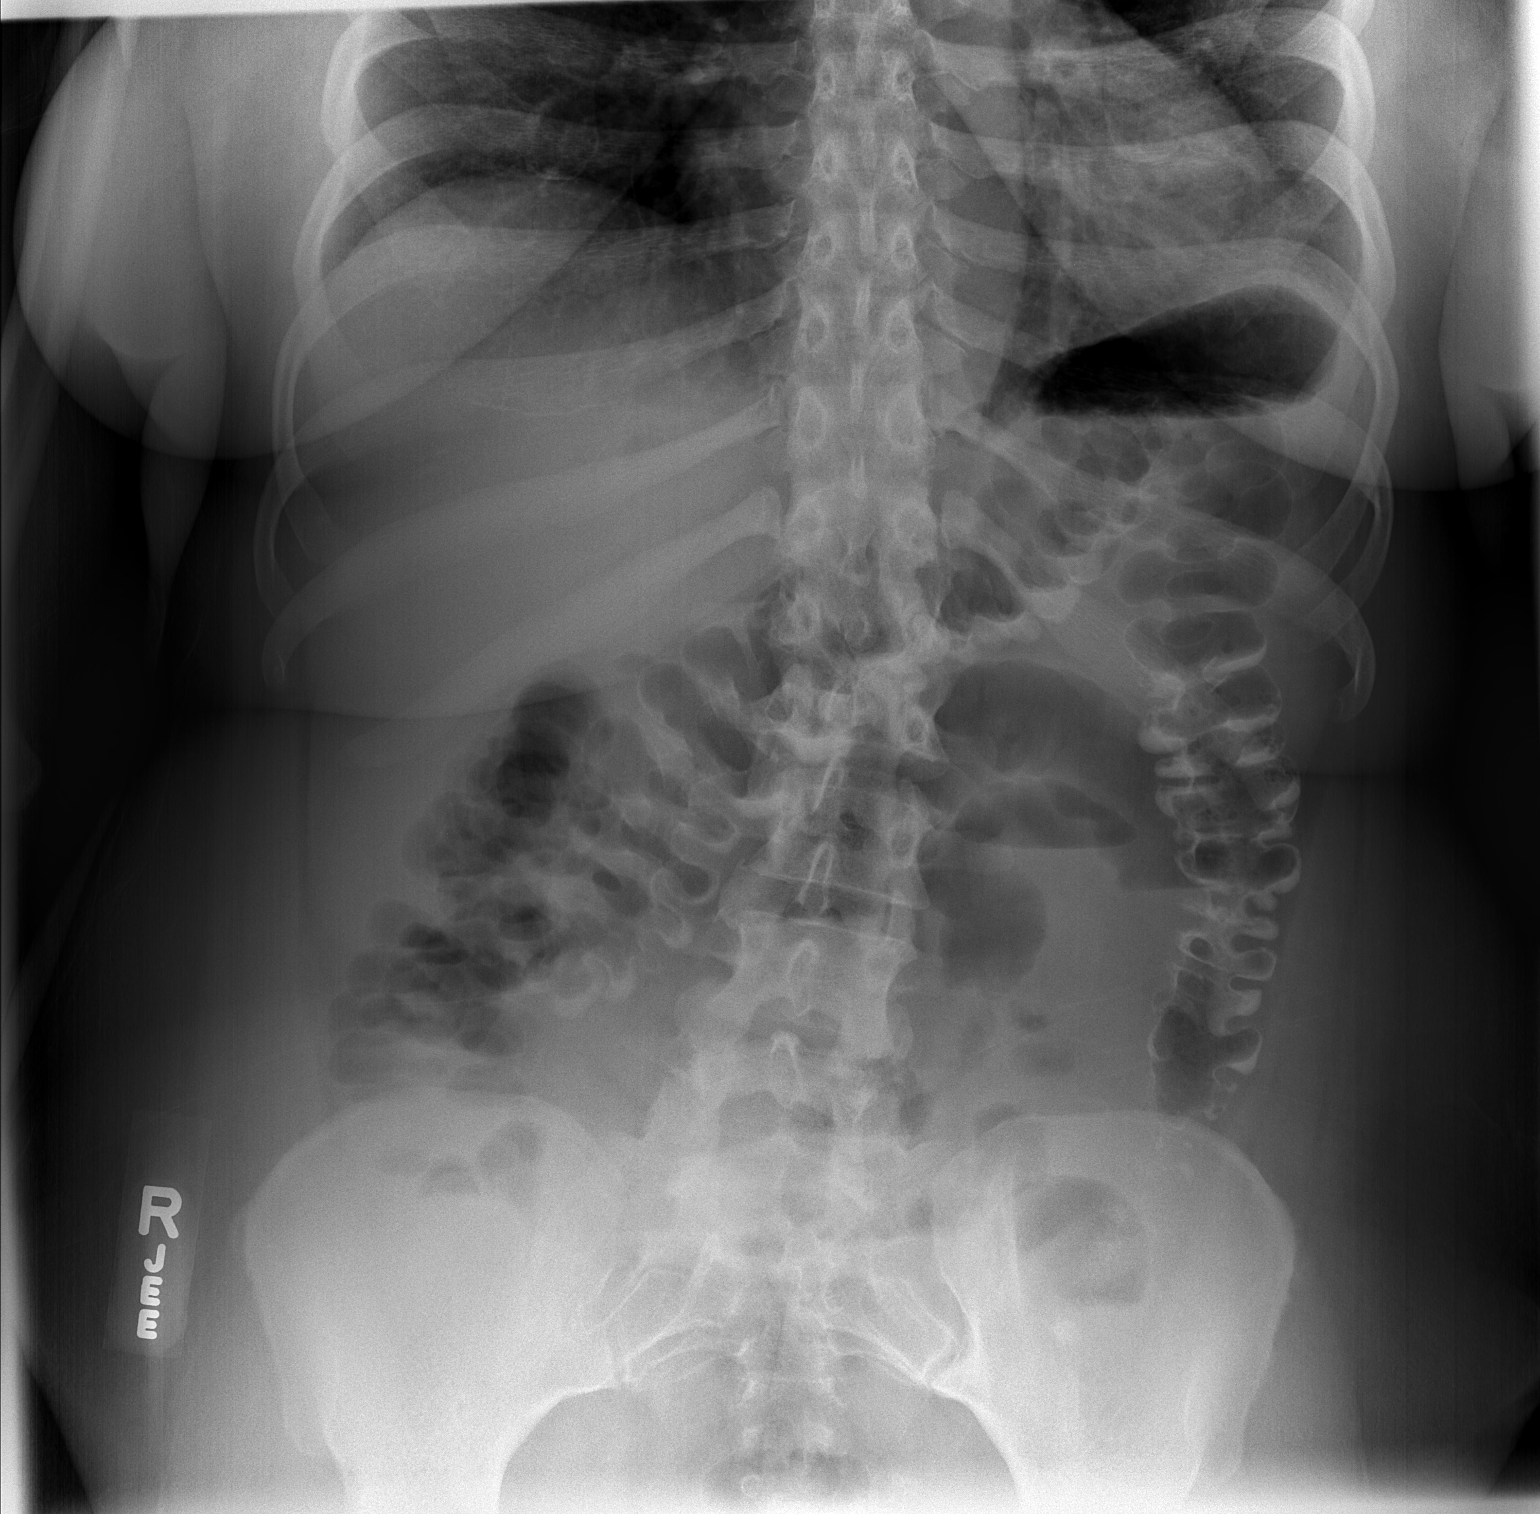

[t abdomen supine *]
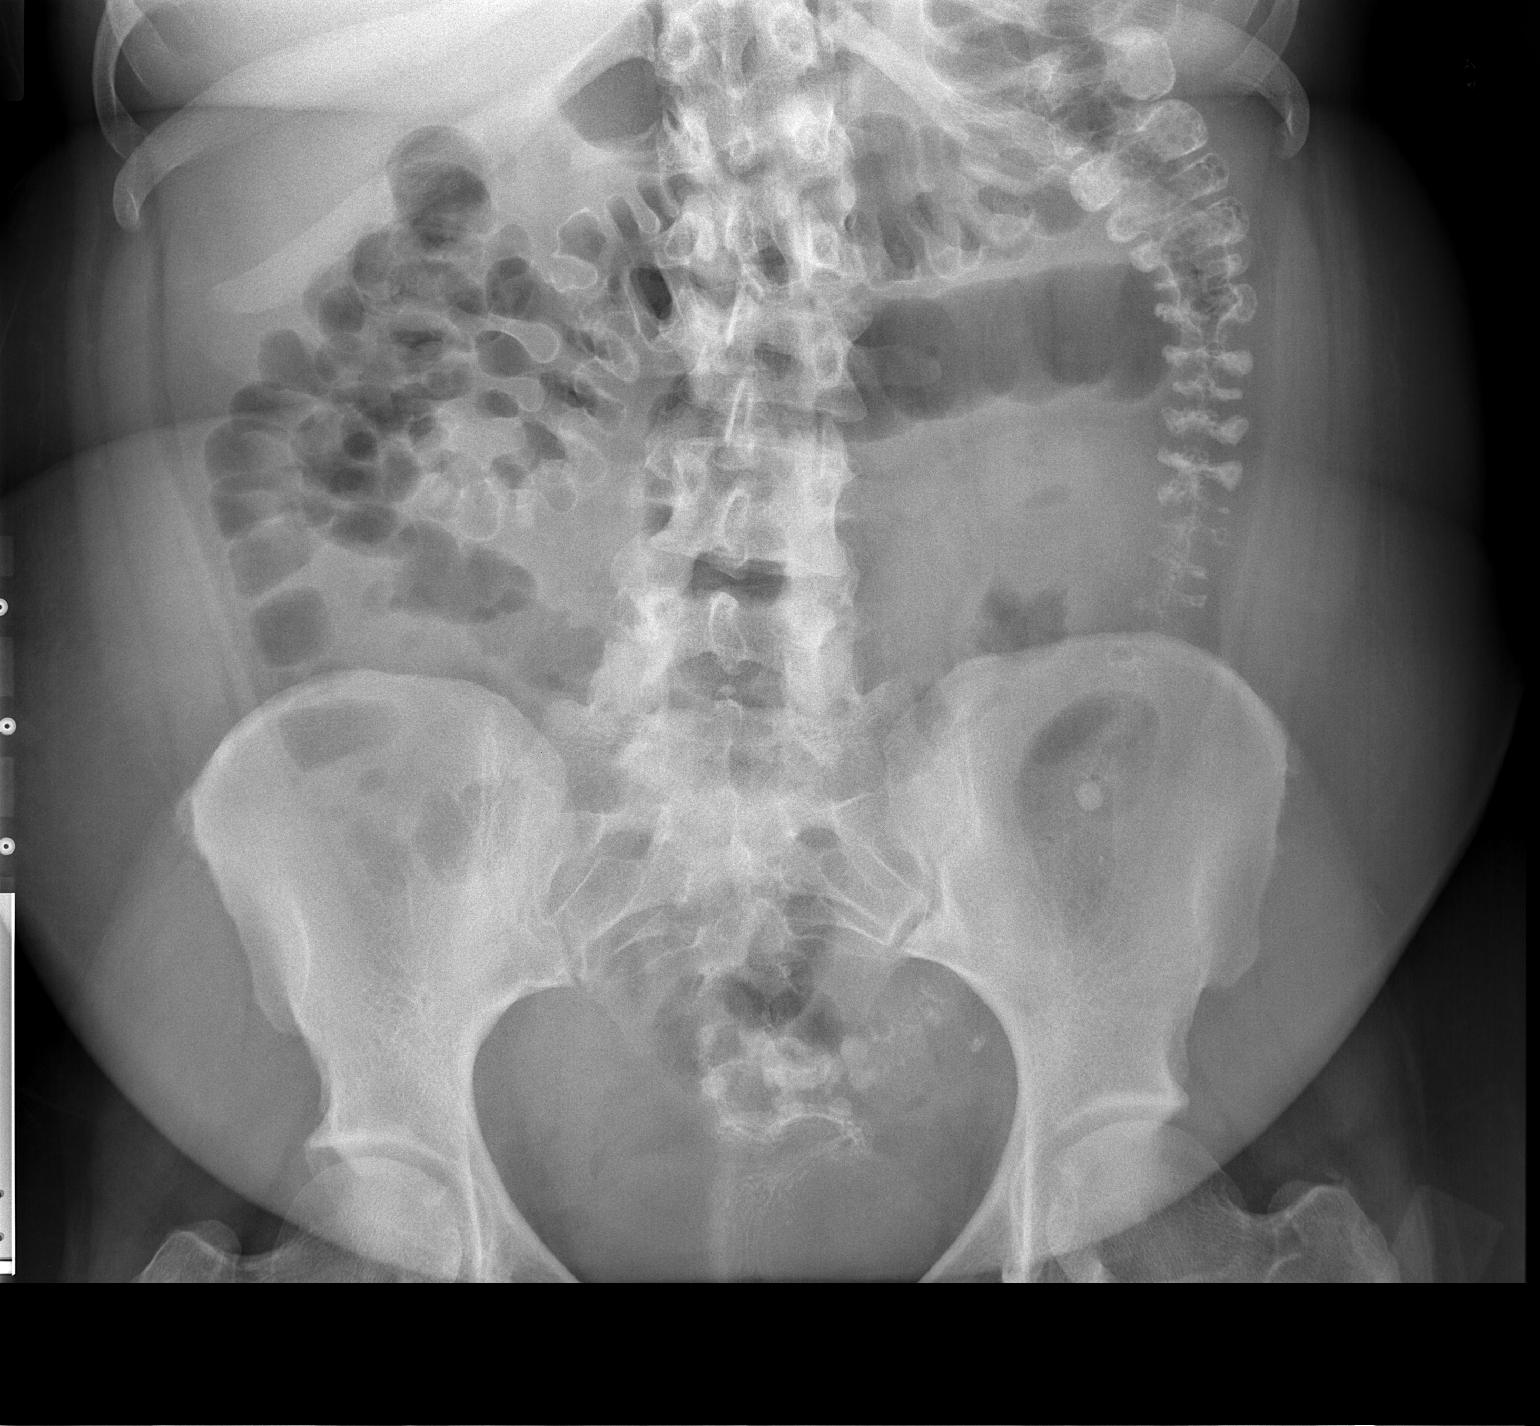

[2 of 2 positions shown; findings below may reference images not displayed]

FINDINGS: Mildly prominent mid abdominal small bowel loops, similar
to prior CT study.  Gas and oral contrast material seen within the
colon which is decompressed and grossly unremarkable.  No free air.
No organomegaly or suspicious calcification.
IMPRESSION: Prominent mid abdominal small bowel loops as seen on prior CT.  No
real change.  No free air.

## 2013-11-30 ENCOUNTER — Emergency Department (HOSPITAL_COMMUNITY)
Admission: EM | Admit: 2013-11-30 | Discharge: 2013-11-30 | Disposition: A | Discharge status: Home / Self Care | Payer: Medicaid Other | Attending: Emergency Medicine | Admitting: Emergency Medicine

## 2013-11-30 ENCOUNTER — Encounter (HOSPITAL_COMMUNITY): Payer: Self-pay | Admitting: Emergency Medicine

## 2013-11-30 DIAGNOSIS — H6692 Otitis media, unspecified, left ear: Secondary | ICD-10-CM

## 2013-11-30 DIAGNOSIS — H6690 Otitis media, unspecified, unspecified ear: Secondary | ICD-10-CM | POA: Insufficient documentation

## 2013-11-30 DIAGNOSIS — Z792 Long term (current) use of antibiotics: Secondary | ICD-10-CM | POA: Insufficient documentation

## 2013-11-30 DIAGNOSIS — F17200 Nicotine dependence, unspecified, uncomplicated: Secondary | ICD-10-CM | POA: Insufficient documentation

## 2013-11-30 DIAGNOSIS — Z862 Personal history of diseases of the blood and blood-forming organs and certain disorders involving the immune mechanism: Secondary | ICD-10-CM | POA: Insufficient documentation

## 2013-11-30 DIAGNOSIS — R599 Enlarged lymph nodes, unspecified: Secondary | ICD-10-CM | POA: Insufficient documentation

## 2013-11-30 DIAGNOSIS — Z79899 Other long term (current) drug therapy: Secondary | ICD-10-CM | POA: Insufficient documentation

## 2013-11-30 DIAGNOSIS — K089 Disorder of teeth and supporting structures, unspecified: Secondary | ICD-10-CM | POA: Insufficient documentation

## 2013-11-30 MED ORDER — HYDROCODONE-ACETAMINOPHEN 5-325 MG PO TABS
2.0000 | ORAL_TABLET | Freq: Once | ORAL | Status: AC
  Administered 2013-11-30: 2 via ORAL
  Filled 2013-11-30: qty 2

## 2013-11-30 MED ORDER — AMOXICILLIN 500 MG PO CAPS: 500.0000 mg | ORAL_CAPSULE | Freq: Three times a day (TID) | ORAL | Status: DC

## 2013-11-30 MED ORDER — HYDROCODONE-ACETAMINOPHEN 5-325 MG PO TABS: 2.0000 | ORAL_TABLET | ORAL | Status: DC | PRN

## 2013-11-30 MED ORDER — AMOXICILLIN 500 MG PO CAPS
500.0000 mg | ORAL_CAPSULE | Freq: Once | ORAL | Status: AC
  Administered 2013-11-30: 500 mg via ORAL
  Filled 2013-11-30: qty 1

## 2013-11-30 NOTE — ED Provider Notes (Signed)
CSN: [id number]     Arrival date & time 11/30/13  1602 History  This chart was scribed for non-physician practitioner, Alvina Chou, PA-C working with Amanda Arthurs, MD by Frederich Balding, ED scribe. This patient was seen in room [place] and the patient's care was started at 4:55 PM.   Chief Complaint  Patient presents with  . Otalgia   The history is provided by the patient. No language interpreter was used.   HPI Comments: Amanda Garner is a 43 y.o. female who presents to the Emergency Department complaining of gradual onset, constant left ear pain that radiates into her teeth that started one month ago. She states she does not haven a broken tooth. Putting pressure on her ear relieves some pain. Pt has taken goody powders and ibuprofen with no relief.   Past Medical History  Diagnosis Date  . Diverticulitis   . Blood transfusion   . Anemia Dx: 2001 or so   . Sickle cell trait    Past Surgical History  Procedure Laterality Date  . Cesarean section  1997; 1997; 2001  . Abdominal hysterectomy      partial hysterectomy    Family History  Problem Relation Age of Onset  . Diabetes type II Mother   . Diabetes type II Father   . Stroke Father    History  Substance Use Topics  . Smoking status: Current Some Day Smoker -- 0.50 packs/day for 15 years    Types: Cigarettes    Last Attempt to Quit: 08/24/2011  . Smokeless tobacco: Never Used  . Alcohol Use: Yes     Comment: occasionally    OB History   Grav Para Term Preterm Abortions TAB SAB Ect Mult Living                 Review of Systems  HENT: Positive for dental problem and ear pain.   All other systems reviewed and are negative.  Allergies  Review of patient's allergies indicates no known allergies.  Home Medications   Current Outpatient Rx  Name  Route  Sig  Dispense  Refill  . aluminum & magnesium hydroxide-simethicone (MYLANTA) 500-450-40 MG/5ML suspension   Oral   Take 15 mLs by mouth every 6 (six)  hours as needed. For indigestion         . calcium carbonate (TUMS - DOSED IN MG ELEMENTAL CALCIUM) 500 MG chewable tablet   Oral   Chew 2 tablets by mouth every 4 (four) hours as needed. For indigestion         . ciprofloxacin (CIPRO) 500 MG tablet   Oral   Take 1 tablet (500 mg total) by mouth 2 (two) times daily.   20 tablet   0   . metroNIDAZOLE (FLAGYL) 500 MG tablet   Oral   Take 1 tablet (500 mg total) by mouth 3 (three) times daily.   30 tablet   0   . UNABLE TO FIND      Please excuse [name] from any work obligations from 09/07/2012 till 09/10/2012 as patient was hospitalized. Patient to return to work on 09/11/2012 without restrictions.   1 Units   0    BP 142/100  Pulse 67  Temp(Src) 98.3 F (36.8 C) (Oral)  Resp 20  SpO2 98%  LMP 08/20/2011  Physical Exam  Nursing note and vitals reviewed. Constitutional: She is oriented to person, place, and time. She appears well-developed and well-nourished. No distress.  HENT:  Head: Normocephalic and  atraumatic.  Left tragus tenderness to palpation. No tenderness to percussion of teeth.   Eyes: EOM are normal.  Neck: Neck supple. No tracheal deviation present.  Cardiovascular: Normal rate.   Pulmonary/Chest: Effort normal. No respiratory distress.  Musculoskeletal: Normal range of motion.  Lymphadenopathy:    She has cervical adenopathy (left sided).  Neurological: She is alert and oriented to person, place, and time.  Skin: Skin is warm and dry.  Psychiatric: She has a normal mood and affect. Her behavior is normal.    ED Course  Procedures (including critical care time)  DIAGNOSTIC STUDIES: Oxygen Saturation is 98% on RA, normal by my interpretation.    COORDINATION OF CARE: 4:57 PM-Discussed treatment plan which includes pain medication and an antibiotic with pt at bedside and pt agreed to plan.   Labs Review Labs Reviewed - No data to display Imaging Review No results found.   EKG  Interpretation None      MDM   Final diagnoses:  Otitis media of left ear    5:02 PM Patient likely has otitis media. Patient will have vicodin for pain and amoxicillin for infection. Vitals stable and patient afebrile. No further evaluation needed at this time.   I personally performed the services described in this documentation, which was scribed in my presence. The recorded information has been reviewed and is accurate.  Alvina Chou, PA-C 11/30/13 1702

## 2013-11-30 NOTE — ED Notes (Signed)
Lt side tooth pain x4 weeks now not getting any better. Took motrin and tylenol with no relief.

## 2013-11-30 NOTE — Discharge Instructions (Signed)
Take amoxicillin as directed until gone. Take Vicodin as needed for pain. Refer to attached documents for more information.

## 2013-12-01 NOTE — ED Provider Notes (Signed)
Medical screening examination/treatment/procedure(s) were performed by non-physician practitioner and as supervising physician I was immediately available for consultation/collaboration.   EKG Interpretation None        Wandra Arthurs, MD 12/01/13 1047

## 2013-12-14 ENCOUNTER — Ambulatory Visit: Payer: Medicaid Other

## 2014-12-16 ENCOUNTER — Encounter (HOSPITAL_COMMUNITY): Payer: Self-pay

## 2014-12-16 ENCOUNTER — Emergency Department (HOSPITAL_COMMUNITY)
Admission: EM | Admit: 2014-12-16 | Discharge: 2014-12-16 | Disposition: A | Discharge status: Home / Self Care | Payer: No Typology Code available for payment source | Attending: Emergency Medicine | Admitting: Emergency Medicine

## 2014-12-16 DIAGNOSIS — Z862 Personal history of diseases of the blood and blood-forming organs and certain disorders involving the immune mechanism: Secondary | ICD-10-CM | POA: Diagnosis not present

## 2014-12-16 DIAGNOSIS — K0889 Other specified disorders of teeth and supporting structures: Secondary | ICD-10-CM

## 2014-12-16 DIAGNOSIS — Z792 Long term (current) use of antibiotics: Secondary | ICD-10-CM | POA: Diagnosis not present

## 2014-12-16 DIAGNOSIS — K0381 Cracked tooth: Secondary | ICD-10-CM | POA: Insufficient documentation

## 2014-12-16 DIAGNOSIS — K088 Other specified disorders of teeth and supporting structures: Secondary | ICD-10-CM | POA: Diagnosis present

## 2014-12-16 DIAGNOSIS — K029 Dental caries, unspecified: Secondary | ICD-10-CM | POA: Insufficient documentation

## 2014-12-16 DIAGNOSIS — Z720 Tobacco use: Secondary | ICD-10-CM | POA: Diagnosis not present

## 2014-12-16 DIAGNOSIS — R21 Rash and other nonspecific skin eruption: Secondary | ICD-10-CM | POA: Diagnosis not present

## 2014-12-16 DIAGNOSIS — Z79899 Other long term (current) drug therapy: Secondary | ICD-10-CM | POA: Diagnosis not present

## 2014-12-16 DIAGNOSIS — H9201 Otalgia, right ear: Secondary | ICD-10-CM | POA: Insufficient documentation

## 2014-12-16 MED ORDER — IBUPROFEN 800 MG PO TABS
800.0000 mg | ORAL_TABLET | Freq: Once | ORAL | Status: AC
  Administered 2014-12-16: 800 mg via ORAL
  Filled 2014-12-16: qty 1

## 2014-12-16 MED ORDER — PENICILLIN V POTASSIUM 250 MG PO TABS: 250.0000 mg | ORAL_TABLET | Freq: Four times a day (QID) | ORAL | Status: AC

## 2014-12-16 NOTE — ED Notes (Addendum)
Pt c/o increasing R side dental pain x 1 week and intermittent L foot pain/blisters/fungus x 6 months.  Pain score 9/10.  Pt reports that she has been seen several times at [facility] concerning foot and was diagnosed w/ "Athlete's foot."  Sts suggested medications are helping.

## 2014-12-16 NOTE — Discharge Instructions (Signed)

## 2014-12-16 NOTE — ED Provider Notes (Signed)
CSN: [id number]     Arrival date & time 12/16/14  1201 History  This chart was scribed for non-physician practitioner, Glendell Docker, NP, working with Merryl Hacker, MD, by Ian Bushman, ED Scribe. This patient was seen in room WTR6/WTR6 and the patient's care was started at 12:11 PM.   Chief Complaint  Patient presents with  . Dental Pain  . Foot Pain    The history is provided by the patient. No language interpreter was used.    HPI Comments: Amanda Garner is a 44 y.o. female who presents to the Emergency Department complaining of dental pain onset 6 days ago and a rash on her left foot onset 1 month ago.  She has associated symptoms of right ear pain.  She does not have a dentist and is unsure if she has diabetes.    Left foot rash: Patient states she has been seen for her foot at [facility] and has been using creams ( including athletes foot cream), but denies any relief.  She has associated symptoms of swelling in her foot intermittently. She has no other complaints today.    Past Medical History  Diagnosis Date  . Diverticulitis   . Blood transfusion   . Anemia Dx: 2001 or so   . Sickle cell trait    Past Surgical History  Procedure Laterality Date  . Cesarean section  1997; 1997; 2001  . Abdominal hysterectomy      partial hysterectomy    Family History  Problem Relation Age of Onset  . Diabetes type II Mother   . Diabetes type II Father   . Stroke Father    History  Substance Use Topics  . Smoking status: Current Some Day Smoker -- 0.50 packs/day for 15 years    Types: Cigarettes    Last Attempt to Quit: 08/24/2011  . Smokeless tobacco: Never Used  . Alcohol Use: Yes     Comment: occasionally    OB History    No data available     Review of Systems  Constitutional: Negative for fever and chills.  HENT: Positive for dental problem and ear pain. Negative for sneezing and sore throat.   Eyes: Negative for discharge.  Gastrointestinal:  Negative for nausea and vomiting.  Skin: Positive for rash.  All other systems reviewed and are negative.     Allergies  Review of patient's allergies indicates no known allergies.  Home Medications   Prior to Admission medications   Medication Sig Start Date End Date Taking? Authorizing Provider  aluminum & magnesium hydroxide-simethicone (MYLANTA) 500-450-40 MG/5ML suspension Take 15 mLs by mouth every 6 (six) hours as needed. For indigestion    Historical Provider, MD  amoxicillin (AMOXIL) 500 MG capsule Take 1 capsule (500 mg total) by mouth 3 (three) times daily. 11/30/13   Kaitlyn Szekalski, PA-C  calcium carbonate (TUMS - DOSED IN MG ELEMENTAL CALCIUM) 500 MG chewable tablet Chew 2 tablets by mouth every 4 (four) hours as needed. For indigestion    Historical Provider, MD  ciprofloxacin (CIPRO) 500 MG tablet Take 1 tablet (500 mg total) by mouth 2 (two) times daily. 09/09/12   Bynum Bellows, MD  HYDROcodone-acetaminophen (NORCO/VICODIN) 5-325 MG per tablet Take 2 tablets by mouth every 4 (four) hours as needed. 11/30/13   Kaitlyn Szekalski, PA-C  metroNIDAZOLE (FLAGYL) 500 MG tablet Take 1 tablet (500 mg total) by mouth 3 (three) times daily. 09/09/12   Bynum Bellows, MD  UNABLE TO FIND Please  excuse [name] from any work obligations from 09/07/2012 till 09/10/2012 as patient was hospitalized. Patient to return to work on 09/11/2012 without restrictions. 09/09/12   Srikar Janna Arch, MD   BP 134/92 mmHg  Temp(Src) 98.6 F (37 C) (Oral)  Resp 14  SpO2 100%  LMP 08/20/2011 Physical Exam  Constitutional: She is oriented to person, place, and time. She appears well-developed and well-nourished. No distress.  HENT:  Head: Normocephalic and atraumatic.  Right Ear: External ear normal.  Left Ear: External ear normal.  Multiple decayed and cracked teeth. No swelling noted.no gum tenderness noted  Eyes: Conjunctivae and EOM are normal.  Neck: Neck supple.  Cardiovascular: Normal rate.    Pulmonary/Chest: Effort normal. No respiratory distress.  Musculoskeletal: Normal range of motion.  Dry scaly area to foot. No drainage redness or warmth  Neurological: She is alert and oriented to person, place, and time.  Skin: Skin is warm and dry.  Psychiatric: She has a normal mood and affect. Her behavior is normal.  Nursing note and vitals reviewed.   ED Course  Procedures (including critical care time) DIAGNOSTIC STUDIES: Oxygen Saturation is 100% on RA, normal by my interpretation.    COORDINATION OF CARE: 12:17 PM Discussed treatment plan with patient at beside, the patient agrees with the plan and has no further questions at this time.   Labs Review Labs Reviewed - No data to display  Imaging Review No results found.   EKG Interpretation None      MDM   Final diagnoses:  Rash  Toothache   Will treat tooth with pcn. Rash on foot consistent with fungal rash. Will failed therapy with cream discussed follow up with derm  I personally performed the services described in this documentation, which was scribed in my presence. The recorded information has been reviewed and is accurate.    Glendell Docker, NP 12/16/14 [place], MD 12/17/14 [phone]

## 2015-02-19 ENCOUNTER — Ambulatory Visit: Payer: No Typology Code available for payment source | Admitting: Podiatry

## 2015-04-11 ENCOUNTER — Encounter: Payer: Self-pay | Admitting: Podiatry

## 2015-04-11 ENCOUNTER — Ambulatory Visit (INDEPENDENT_AMBULATORY_CARE_PROVIDER_SITE_OTHER): Payer: No Typology Code available for payment source | Admitting: Podiatry

## 2015-04-11 VITALS — BP 128/88 | HR 71 | Ht 66.0 in | Wt 191.0 lb

## 2015-04-11 DIAGNOSIS — L089 Local infection of the skin and subcutaneous tissue, unspecified: Secondary | ICD-10-CM | POA: Diagnosis not present

## 2015-04-11 DIAGNOSIS — S90822A Blister (nonthermal), left foot, initial encounter: Principal | ICD-10-CM

## 2015-04-11 DIAGNOSIS — B353 Tinea pedis: Secondary | ICD-10-CM

## 2015-04-11 DIAGNOSIS — S90829A Blister (nonthermal), unspecified foot, initial encounter: Secondary | ICD-10-CM

## 2015-04-11 MED ORDER — TERBINAFINE HCL 250 MG PO TABS: 250.0000 mg | ORAL_TABLET | Freq: Every day | ORAL | Status: DC

## 2015-04-11 MED ORDER — CLOTRIMAZOLE-BETAMETHASONE 1-0.05 % EX CREA: 1.0000 "application " | TOPICAL_CREAM | Freq: Two times a day (BID) | CUTANEOUS | Status: DC

## 2015-04-11 NOTE — Patient Instructions (Signed)
Seen for skin infection. Noted of fungal infection with crust formation left foot. Take Lamisil pill one a day x 3 weeks. Apply Lotrisone cream as needed on affected area. Clean and scrub with Salsun blue after each shower. Return in 3 weeks.

## 2015-04-11 NOTE — Progress Notes (Signed)
Subjective: 44 year old female presents complaining of pain and itch on left foot from skin infection. She has been to hospital and was diagnosed with Athletes foot. Feel itch and like worms are crawling.  Patient wants the bunion removed after skin infection has resolved.  Review of Systems - Negative except chief complaints.   Objective: Dermatologic: Scaly skin with multiple dry brown and black skin crust and patches, and bumps left foot plantar, medial and lateral surface. No draining lesions noted.  Neurovascular status are within normal. Orthopedic:  HAV with bunion bilateral. HT 5 right.   Assessment: Tinea pedis with skin lesions left foot.  Plan:  Take Lamisil pill one a day x 3 weeks. Apply Lotrisone cream as needed on affected area. Clean and scrub with Salsun blue after each shower. Return in 3 weeks.

## 2015-05-09 ENCOUNTER — Ambulatory Visit: Payer: No Typology Code available for payment source | Admitting: Podiatry

## 2015-11-24 ENCOUNTER — Encounter (HOSPITAL_COMMUNITY): Payer: Self-pay | Admitting: *Deleted

## 2015-11-24 ENCOUNTER — Emergency Department (HOSPITAL_COMMUNITY)
Admission: EM | Admit: 2015-11-24 | Discharge: 2015-11-24 | Disposition: A | Discharge status: Home / Self Care | Payer: PRIVATE HEALTH INSURANCE | Attending: Emergency Medicine | Admitting: Emergency Medicine

## 2015-11-24 DIAGNOSIS — F17210 Nicotine dependence, cigarettes, uncomplicated: Secondary | ICD-10-CM | POA: Insufficient documentation

## 2015-11-24 DIAGNOSIS — R109 Unspecified abdominal pain: Secondary | ICD-10-CM | POA: Insufficient documentation

## 2015-11-24 LAB — URINALYSIS, ROUTINE W REFLEX MICROSCOPIC
Bilirubin Urine: NEGATIVE
GLUCOSE, UA: NEGATIVE mg/dL
KETONES UR: NEGATIVE mg/dL
LEUKOCYTES UA: NEGATIVE
Nitrite: NEGATIVE
PROTEIN: NEGATIVE mg/dL
Specific Gravity, Urine: 1.02 (ref 1.005–1.030)
pH: 5.5 (ref 5.0–8.0)

## 2015-11-24 LAB — URINE MICROSCOPIC-ADD ON

## 2015-11-24 LAB — COMPREHENSIVE METABOLIC PANEL
ALK PHOS: 51 U/L (ref 38–126)
ALT: 18 U/L (ref 14–54)
AST: 22 U/L (ref 15–41)
Albumin: 3.6 g/dL (ref 3.5–5.0)
Anion gap: 10 (ref 5–15)
BILIRUBIN TOTAL: 0.4 mg/dL (ref 0.3–1.2)
BUN: 10 mg/dL (ref 6–20)
CALCIUM: 9.4 mg/dL (ref 8.9–10.3)
CO2: 23 mmol/L (ref 22–32)
CREATININE: 0.98 mg/dL (ref 0.44–1.00)
Chloride: 107 mmol/L (ref 101–111)
Glucose, Bld: 104 mg/dL — ABNORMAL HIGH (ref 65–99)
Potassium: 3.8 mmol/L (ref 3.5–5.1)
Sodium: 140 mmol/L (ref 135–145)
TOTAL PROTEIN: 6.9 g/dL (ref 6.5–8.1)

## 2015-11-24 LAB — LIPASE, BLOOD: Lipase: 29 U/L (ref 11–51)

## 2015-11-24 LAB — CBC
HCT: 38.8 % (ref 36.0–46.0)
Hemoglobin: 13 g/dL (ref 12.0–15.0)
MCH: 22.6 pg — ABNORMAL LOW (ref 26.0–34.0)
MCHC: 33.5 g/dL (ref 30.0–36.0)
MCV: 67.4 fL — AB (ref 78.0–100.0)
PLATELETS: 213 10*3/uL (ref 150–400)
RBC: 5.76 MIL/uL — AB (ref 3.87–5.11)
RDW: 14.7 % (ref 11.5–15.5)
WBC: 7.9 10*3/uL (ref 4.0–10.5)

## 2015-11-24 NOTE — ED Notes (Signed)
Pt reports hx of diverticulitis. Has been to [facility] and was told it has become more severe and that she needs surgery. Pt reports increase in pain this am, had bowel movement that was extremely painful. Denies n/v.

## 2015-11-24 NOTE — ED Notes (Signed)
Patient called 3X for vital signs reassessment

## 2015-11-24 NOTE — ED Notes (Signed)
Patient called for room 3X with no response

## 2016-07-29 ENCOUNTER — Ambulatory Visit (HOSPITAL_COMMUNITY)
Admission: EM | Admit: 2016-07-29 | Discharge: 2016-07-29 | Disposition: A | Discharge status: Home / Self Care | Payer: BLUE CROSS/BLUE SHIELD | Attending: Emergency Medicine | Admitting: Emergency Medicine

## 2016-07-29 ENCOUNTER — Encounter (HOSPITAL_COMMUNITY): Payer: Self-pay | Admitting: Emergency Medicine

## 2016-07-29 DIAGNOSIS — R21 Rash and other nonspecific skin eruption: Secondary | ICD-10-CM | POA: Diagnosis not present

## 2016-07-29 DIAGNOSIS — K0889 Other specified disorders of teeth and supporting structures: Secondary | ICD-10-CM | POA: Diagnosis not present

## 2016-07-29 MED ORDER — CLOTRIMAZOLE-BETAMETHASONE 1-0.05 % EX CREA: TOPICAL_CREAM | CUTANEOUS | 0 refills | Status: DC

## 2016-07-29 MED ORDER — AMOXICILLIN 500 MG PO CAPS: 1000.0000 mg | ORAL_CAPSULE | Freq: Two times a day (BID) | ORAL | 0 refills | Status: DC

## 2016-07-29 MED ORDER — HYDROCODONE-ACETAMINOPHEN 5-325 MG PO TABS: 1.0000 | ORAL_TABLET | ORAL | 0 refills | Status: DC | PRN

## 2016-07-29 NOTE — ED Provider Notes (Signed)
CSN: [id number]     Arrival date & time 07/29/16  1934 History   First MD Initiated Contact with Patient 07/29/16 2013     Chief Complaint  Patient presents with  . Dental Pain  . Rash   (Consider location/radiation/quality/duration/timing/severity/associated sxs/prior Treatment) Procedure old female complaining of a toothache for 2 weeks. Complaining of the right lateral incisor which has fractured and decayed below the gumline. She states she has an appointment with her dentist in 4 days which will be Monday. Complaining of pain at the site of the tooth with pain radiating into the face. Second complaint is that of itchy feet for 3 months. 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        Past Medical History:  Diagnosis Date  . Anemia Dx: 2001 or so   . Blood transfusion   . Diverticulitis   . Sickle cell trait [facility])    Past Surgical History:  Procedure Laterality Date  . ABDOMINAL HYSTERECTOMY     partial hysterectomy   . [place]; 1997; 2001   Family History  Problem Relation Age of Onset  . Diabetes type II Mother   . Diabetes type II Father   . Stroke Father    Social History  Substance Use Topics  . Smoking status: Current Some Day Smoker    Packs/day: 0.50    Years: 15.00    Types: Cigarettes    Last attempt to quit: 08/24/2011  . Smokeless tobacco: Never Used  . Alcohol use 0.0 oz/week     Comment: occasionally    OB History    No data available     Review of Systems  Constitutional: Negative.   HENT: Positive for dental problem.   Respiratory: Negative.   Gastrointestinal: Negative.   Skin:       Itchy feet plantar aspect bilateral feet    Allergies  Patient has no known allergies.  Home Medications   Prior to Admission medications   Medication Sig Start Date End Date Taking? Authorizing Provider  amoxicillin (AMOXIL) 500 MG capsule Take 2 capsules (1,000 mg total) by mouth 2  (two) times daily. 07/29/16   [name], NP  clotrimazole-betamethasone (LOTRISONE) cream Apply to affected area 2 times daily prn 07/29/16   [name], NP  HYDROcodone-acetaminophen (NORCO/VICODIN) 5-325 MG tablet Take 1 tablet by mouth every 4 (four) hours as needed. 07/29/16   [name], NP   Meds Ordered and Administered this Visit  Medications - No data to display  BP (S) (!) 174/109 (BP Location: Left Arm)   Pulse 75   Temp 98.4 F (36.9 C) (Oral)   Resp 20   LMP 08/20/2011   SpO2 98%  No data found.   Physical Exam  Constitutional: She is oriented to person, place, and time. She appears well-developed and well-nourished. No distress.  HENT:  Head: Normocephalic and atraumatic.  Mouth/Throat: Oropharynx is clear and moist.  There did a repair. Right upper lateral incisor with previous fracture now decayed  below the gumline. Positive for local tenderness. No abscess formation is seen. Minimal swelling to the associated gingiva.  Neck: Neck supple.  Cardiovascular: Normal rate.   Pulmonary/Chest: Effort normal.  Neurological: She is alert and oriented to person, place, and time.  Skin:  Bilateral feet with dry scaly scan. No open lesions. No maceration. Does not appear to be completely fungal although it is possible.  Psychiatric: She has a normal mood and affect.  Nursing note and vitals reviewed.   Urgent Care Course   Clinical Course     Procedures (including critical care time)  Labs Review Labs Reviewed - No data to display  Imaging Review No results found.   Visual Acuity Review  Right Eye Distance:   Left Eye Distance:   Bilateral Distance:    Right Eye Near:   Left Eye Near:    Bilateral Near:         MDM   1. Pain, dental   2. Rash    Apply the cream to your feet twice a day. Take the medicine for your teeth and be sure to follow-up with your dentist in 4 days as scheduled. Meds ordered this encounter  Medications  .  HYDROcodone-acetaminophen (NORCO/VICODIN) 5-325 MG tablet    Sig: Take 1 tablet by mouth every 4 (four) hours as needed.    Dispense:  15 tablet    Refill:  0    Order Specific Question:   Supervising Provider    Answer:   Melony Overly [id number]  . amoxicillin (AMOXIL) 500 MG capsule    Sig: Take 2 capsules (1,000 mg total) by mouth 2 (two) times daily.    Dispense:  32 capsule    Refill:  0    Order Specific Question:   Supervising Provider    Answer:   Melony Overly [id number]  . clotrimazole-betamethasone (LOTRISONE) cream    Sig: Apply to affected area 2 times daily prn    Dispense:  30 g    Refill:  0    Order Specific Question:   Supervising Provider    Answer:   Carmela Hurt       [name], NP 07/29/16 2026

## 2016-07-29 NOTE — Discharge Instructions (Signed)
Apply the cream to your feet twice a day. Take the medicine for your teeth and be sure to follow-up with your dentist in 4 days as scheduled.

## 2016-07-29 NOTE — ED Triage Notes (Signed)
Here right sided dental pain onset 2 weeks associated w/right ear pain and facial swelling, and HA  Taking OTC pain meds w/no relief.   Also c/o rash on bilateral foot 3-4 months  A&O x4... NAD

## 2016-10-19 ENCOUNTER — Encounter (HOSPITAL_COMMUNITY): Payer: Self-pay

## 2016-10-19 ENCOUNTER — Emergency Department (HOSPITAL_COMMUNITY): Payer: BLUE CROSS/BLUE SHIELD

## 2016-10-19 ENCOUNTER — Emergency Department (HOSPITAL_COMMUNITY)
Admission: EM | Admit: 2016-10-19 | Discharge: 2016-10-19 | Disposition: A | Discharge status: Home / Self Care | Payer: BLUE CROSS/BLUE SHIELD | Attending: Emergency Medicine | Admitting: Emergency Medicine

## 2016-10-19 DIAGNOSIS — R072 Precordial pain: Secondary | ICD-10-CM | POA: Diagnosis present

## 2016-10-19 DIAGNOSIS — R1084 Generalized abdominal pain: Secondary | ICD-10-CM | POA: Diagnosis not present

## 2016-10-19 DIAGNOSIS — R9389 Abnormal findings on diagnostic imaging of other specified body structures: Secondary | ICD-10-CM

## 2016-10-19 DIAGNOSIS — K5732 Diverticulitis of large intestine without perforation or abscess without bleeding: Secondary | ICD-10-CM

## 2016-10-19 DIAGNOSIS — F17210 Nicotine dependence, cigarettes, uncomplicated: Secondary | ICD-10-CM | POA: Diagnosis not present

## 2016-10-19 LAB — HEPATIC FUNCTION PANEL
ALBUMIN: 3.4 g/dL — AB (ref 3.5–5.0)
ALT: 13 U/L — AB (ref 14–54)
AST: 18 U/L (ref 15–41)
Alkaline Phosphatase: 49 U/L (ref 38–126)
BILIRUBIN DIRECT: 0.2 mg/dL (ref 0.1–0.5)
BILIRUBIN TOTAL: 0.6 mg/dL (ref 0.3–1.2)
Indirect Bilirubin: 0.4 mg/dL (ref 0.3–0.9)
Total Protein: 6.7 g/dL (ref 6.5–8.1)

## 2016-10-19 LAB — BASIC METABOLIC PANEL
ANION GAP: 6 (ref 5–15)
BUN: 8 mg/dL (ref 6–20)
CALCIUM: 8.9 mg/dL (ref 8.9–10.3)
CHLORIDE: 110 mmol/L (ref 101–111)
CO2: 23 mmol/L (ref 22–32)
Creatinine, Ser: 0.74 mg/dL (ref 0.44–1.00)
GFR calc non Af Amer: >60 mL/min (ref >60)
GLUCOSE: 108 mg/dL — AB (ref 65–99)
POTASSIUM: 3.7 mmol/L (ref 3.5–5.1)
Sodium: 139 mmol/L (ref 135–145)

## 2016-10-19 LAB — LIPASE, BLOOD: Lipase: 27 U/L (ref 11–51)

## 2016-10-19 LAB — URINALYSIS, ROUTINE W REFLEX MICROSCOPIC
BILIRUBIN URINE: NEGATIVE
Glucose, UA: NEGATIVE mg/dL
HGB URINE DIPSTICK: NEGATIVE
Ketones, ur: NEGATIVE mg/dL
Leukocytes, UA: NEGATIVE
Nitrite: NEGATIVE
Protein, ur: NEGATIVE mg/dL
pH: 7 (ref 5.0–8.0)

## 2016-10-19 LAB — I-STAT TROPONIN, ED
TROPONIN I, POC: 0 ng/mL (ref 0.00–0.08)
TROPONIN I, POC: 0 ng/mL (ref 0.00–0.08)

## 2016-10-19 LAB — CBC
HEMATOCRIT: 35.5 % — AB (ref 36.0–46.0)
HEMOGLOBIN: 11.6 g/dL — AB (ref 12.0–15.0)
MCH: 22 pg — AB (ref 26.0–34.0)
MCHC: 32.7 g/dL (ref 30.0–36.0)
MCV: 67.2 fL — AB (ref 78.0–100.0)
Platelets: 237 10*3/uL (ref 150–400)
RBC: 5.28 MIL/uL — AB (ref 3.87–5.11)
RDW: 14.5 % (ref 11.5–15.5)
WBC: 8.4 10*3/uL (ref 4.0–10.5)

## 2016-10-19 MED ORDER — SODIUM CHLORIDE 0.9 % IV BOLUS (SEPSIS)
1000.0000 mL | Freq: Once | INTRAVENOUS | Status: AC
  Administered 2016-10-19: 1000 mL via INTRAVENOUS

## 2016-10-19 MED ORDER — OMEPRAZOLE 20 MG PO CPDR: 20.0000 mg | DELAYED_RELEASE_CAPSULE | Freq: Every day | ORAL | 0 refills | Status: DC

## 2016-10-19 MED ORDER — METRONIDAZOLE 500 MG PO TABS: 500.0000 mg | ORAL_TABLET | Freq: Three times a day (TID) | ORAL | 0 refills | Status: AC

## 2016-10-19 MED ORDER — ONDANSETRON 4 MG PO TBDP: 4.0000 mg | ORAL_TABLET | Freq: Three times a day (TID) | ORAL | 0 refills | Status: DC | PRN

## 2016-10-19 MED ORDER — METRONIDAZOLE 500 MG PO TABS
500.0000 mg | ORAL_TABLET | Freq: Once | ORAL | Status: AC
  Administered 2016-10-19: 500 mg via ORAL
  Filled 2016-10-19: qty 1

## 2016-10-19 MED ORDER — HYDROCODONE-ACETAMINOPHEN 5-325 MG PO TABS: 1.0000 | ORAL_TABLET | ORAL | 0 refills | Status: DC | PRN

## 2016-10-19 MED ORDER — GI COCKTAIL ~~LOC~~
30.0000 mL | Freq: Once | ORAL | Status: AC
  Administered 2016-10-19: 30 mL via ORAL
  Filled 2016-10-19: qty 30

## 2016-10-19 MED ORDER — MORPHINE SULFATE (PF) 4 MG/ML IV SOLN
4.0000 mg | Freq: Once | INTRAVENOUS | Status: AC
  Administered 2016-10-19: 4 mg via INTRAVENOUS
  Filled 2016-10-19: qty 1

## 2016-10-19 MED ORDER — CIPROFLOXACIN HCL 500 MG PO TABS: 500.0000 mg | ORAL_TABLET | Freq: Two times a day (BID) | ORAL | 0 refills | Status: AC

## 2016-10-19 MED ORDER — IOPAMIDOL (ISOVUE-300) INJECTION 61%
INTRAVENOUS | Status: AC
  Administered 2016-10-19: 100 mL
  Filled 2016-10-19: qty 100

## 2016-10-19 MED ORDER — CIPROFLOXACIN IN D5W 400 MG/200ML IV SOLN
400.0000 mg | Freq: Once | INTRAVENOUS | Status: AC
  Administered 2016-10-19: 400 mg via INTRAVENOUS
  Filled 2016-10-19: qty 200

## 2016-10-19 NOTE — ED Provider Notes (Signed)
[facility] DEPT Provider Note   CSN: [id number] Arrival date & time: 10/19/16  1543     History   Chief Complaint Chief Complaint  Patient presents with  . Chest Pain    HPI Amanda Garner is a 46 y.o. female.  HPI   46 year old female with past medical history of recurrent diverticulitis who presents with epigastric pain. The patient states that over the last several months, she has had progressively worsening burning, aching, dull, substernal and epigastric chest pressure with a burning sensation that radiates into her mouth. She usually takes over-the-counter Zantac with improvement in her pain pain. However, over the last several hours, her pain has returned and did not improve with Zantac. She has not seen a GI specialist for this. Of note, she was previously told she had a mass in her colon that has eroded through her bladder and she was scheduled for surgery but she lost follow-up. She denies any weight loss, night sweats. No diarrhea or bloody bowel movements. No other medical complaints.  Past Medical History:  Diagnosis Date  . Anemia Dx: 2001 or so   . Blood transfusion   . Diverticulitis   . Sickle cell trait [facility])     Patient Active Problem List   Diagnosis Date Noted  . Tinea pedis of left foot 04/11/2015  . Blister of foot with infection 04/11/2015  . Diverticulitis 09/07/2012  . C. difficile colitis 08/26/2011  . Abdominal pain, other specified site 08/26/2011  . Diarrhea 08/26/2011  . Abnormal finding on GI tract imaging 08/26/2011  . Enteritis 08/25/2011    Past Surgical History:  Procedure Laterality Date  . ABDOMINAL HYSTERECTOMY     partial hysterectomy   . [place]; 1997; 2001    OB History    No data available       Home Medications    Prior to Admission medications   Medication Sig Start Date End Date Taking? Authorizing Provider  amoxicillin (AMOXIL) 500 MG capsule Take 2 capsules (1,000 mg total) by mouth 2  (two) times daily. 07/29/16   [name], NP  ciprofloxacin (CIPRO) 500 MG tablet Take 1 tablet (500 mg total) by mouth 2 (two) times daily. 10/19/16 10/29/16  [name], MD  clotrimazole-betamethasone (LOTRISONE) cream Apply to affected area 2 times daily prn 07/29/16   [name], NP  HYDROcodone-acetaminophen (NORCO/VICODIN) 5-325 MG tablet Take 1-2 tablets by mouth every 4 (four) hours as needed for severe pain. 10/19/16   [name], MD  metroNIDAZOLE (FLAGYL) 500 MG tablet Take 1 tablet (500 mg total) by mouth 3 (three) times daily. 10/19/16 10/29/16  [name], MD  omeprazole (PRILOSEC) 20 MG capsule Take 1 capsule (20 mg total) by mouth daily. 10/19/16   [name], MD  ondansetron (ZOFRAN ODT) 4 MG disintegrating tablet Take 1 tablet (4 mg total) by mouth every 8 (eight) hours as needed for nausea or vomiting. 10/19/16   [name], MD    Family History Family History  Problem Relation Age of Onset  . Diabetes type II Mother   . Diabetes type II Father   . Stroke Father     Social History Social History  Substance Use Topics  . Smoking status: Current Some Day Smoker    Packs/day: 0.50    Years: 15.00    Types: Cigarettes    Last attempt to quit: 08/24/2011  . Smokeless tobacco: Never Used  . Alcohol use 0.0 oz/week     Comment: occasionally  Allergies   Patient has no known allergies.   Review of Systems Review of Systems  Constitutional: Positive for fatigue. Negative for chills and fever.  HENT: Negative for congestion and rhinorrhea.   Eyes: Negative for visual disturbance.  Respiratory: Negative for cough, shortness of breath and wheezing.   Cardiovascular: Positive for chest pain. Negative for leg swelling.  Gastrointestinal: Positive for abdominal pain and nausea. Negative for diarrhea and vomiting.  Genitourinary: Negative for dysuria and flank pain.  Musculoskeletal: Negative for neck pain and neck stiffness.  Skin: Negative for rash and  wound.  Allergic/Immunologic: Negative for immunocompromised state.  Neurological: Negative for syncope, weakness and headaches.  All other systems reviewed and are negative.    Physical Exam Updated Vital Signs BP 118/78   Pulse 82   Resp 17   Ht 5' 6"  (1.676 m)   Wt 200 lb (90.7 kg)   LMP 08/20/2011   SpO2 98%   BMI 32.28 kg/m   Physical Exam  Constitutional: She is oriented to person, place, and time. She appears well-developed and well-nourished. No distress.  HENT:  Head: Normocephalic and atraumatic.  Eyes: Conjunctivae are normal.  Neck: Neck supple.  Cardiovascular: Normal rate, regular rhythm and normal heart sounds.  Exam reveals no friction rub.   No murmur heard. Pulmonary/Chest: Effort normal and breath sounds normal. No respiratory distress. She has no wheezes. She has no rales.  Abdominal: She exhibits no distension. There is generalized tenderness. There is no rigidity, no rebound and no guarding.  Musculoskeletal: She exhibits no edema.  Neurological: She is alert and oriented to person, place, and time. She exhibits normal muscle tone.  Skin: Skin is warm. Capillary refill takes less than 2 seconds.  Psychiatric: She has a normal mood and affect.  Nursing note and vitals reviewed.    ED Treatments / Results  Labs (all labs ordered are listed, but only abnormal results are displayed) Labs Reviewed  BASIC METABOLIC PANEL - Abnormal; Notable for the following:       Result Value   Glucose, Bld 108 (*)    All other components within normal limits  CBC - Abnormal; Notable for the following:    RBC 5.28 (*)    Hemoglobin 11.6 (*)    HCT 35.5 (*)    MCV 67.2 (*)    MCH 22.0 (*)    All other components within normal limits  HEPATIC FUNCTION PANEL - Abnormal; Notable for the following:    Albumin 3.4 (*)    ALT 13 (*)    All other components within normal limits  URINALYSIS, ROUTINE W REFLEX MICROSCOPIC - Abnormal; Notable for the following:     Specific Gravity, Urine >1.046 (*)    All other components within normal limits  LIPASE, BLOOD  I-STAT TROPOININ, ED  I-STAT TROPOININ, ED    EKG  EKG Interpretation  Date/Time:  Tuesday October 19 2016 17:37:03 EST Ventricular Rate:  79 PR Interval:    QRS Duration: 81 QT Interval:  380 QTC Calculation: 436 R Axis:   125 Text Interpretation:  Right and left arm electrode reversal, interpretation assumes no reversal Sinus rhythm Right axis deviation Nonspecific T abnormalities, lateral leads No significant change since last tracing Confirmed by Makensie Mulhall MD, Lysbeth Galas [phone]) on 10/20/2016 10:41:55 AM       Radiology Dg Chest 2 View  Result Date: 10/19/2016 CLINICAL DATA:  Intermittent chest pain for the past 6 months. Onset of most recent episode at 9 p.m. with relief  with Zantac. The patient was subsequently awakened with this wheezing mid and left-sided chest pain. EXAM: CHEST  2 VIEW COMPARISON:  PA and lateral chest x-ray of November 20, 2014. FINDINGS: The lungs are well-expanded. There is no focal infiltrate. There is no pleural effusion. The heart and pulmonary vascularity are normal. The mediastinum is normal in width. The bony thorax exhibits no acute abnormality. IMPRESSION: There is no active cardiopulmonary disease. Electronically Signed   By: David  Martinique M.D.   On: 10/19/2016 16:41   Ct Abdomen Pelvis W Contrast  Result Date: 10/19/2016 CLINICAL DATA:  Recurrent abdominal pain in a patient with history of Crohn disease. EXAM: CT ABDOMEN AND PELVIS WITH CONTRAST TECHNIQUE: Multidetector CT imaging of the abdomen and pelvis was performed using the standard protocol following bolus administration of intravenous contrast. CONTRAST:  123m ISOVUE-300 IOPAMIDOL (ISOVUE-300) INJECTION 61% COMPARISON:  12/12/2015 FINDINGS: Lower chest:  Unremarkable. Hepatobiliary: No focal abnormality within the liver parenchyma. Gallbladder decompressed. No intrahepatic or extrahepatic biliary  dilation. Pancreas: No focal mass lesion. No dilatation of the main duct. No intraparenchymal cyst. No peripancreatic edema. Spleen: No splenomegaly. No focal mass lesion. Adrenals/Urinary Tract: No adrenal nodule or mass. Kidneys are unremarkable. No evidence for hydroureter. The urinary bladder appears normal for the degree of distention. Stomach/Bowel: Tiny hiatal hernia noted. Stomach otherwise unremarkable. Duodenum is normally positioned as is the ligament of Treitz. No small bowel wall thickening. No small bowel dilatation. The terminal ileum is normal. The appendix is normal. Diverticular changes are noted in the left colon without evidence of diverticulitis. Focal area of colonic wall thickening identified in the mid sigmoid colon, similar location as on prior study. There is adjacent edema/ inflammation with a potential area of phlegmon between the sigmoid colon and the dome of the bladder. Bladder wall in this region does not appear markedly thickened, but there may be some tethering. Vascular/Lymphatic: No abdominal aortic aneurysm. No abdominal aortic atherosclerotic calcification. There is no gastrohepatic or hepatoduodenal ligament lymphadenopathy. No intraperitoneal or retroperitoneal lymphadenopathy. No pelvic sidewall lymphadenopathy. Reproductive: Status post hysterectomy.  There is no adnexal mass. Other: No substantial intraperitoneal free fluid. Musculoskeletal: Bone windows reveal no worrisome lytic or sclerotic osseous lesions. IMPRESSION: 1. Left colonic diverticulosis with a focal area of wall thickening in the mid sigmoid segment assisted with pericolonic edema, features consistent with diverticulitis. There is abnormal soft tissue between this segment of abnormal colon and dome of the bladder which may be related to inflammatory change. Although there may be some tethering of the bladder dome to this segment of colon, no definite findings of colovesical fistula at this time. 2. No small  bowel abnormality identified. The terminal ileum is normal. Electronically Signed   By: EMisty StanleyM.D.   On: 10/19/2016 17:18    Procedures Procedures (including critical care time)  Medications Ordered in ED Medications  gi cocktail (Maalox,Lidocaine,Donnatal) (30 mLs Oral Given 10/19/16 1730)  iopamidol (ISOVUE-300) 61 % injection (100 mLs  Contrast Given 10/19/16 1642)  ciprofloxacin (CIPRO) IVPB 400 mg (0 mg Intravenous Stopped 10/19/16 2033)  metroNIDAZOLE (FLAGYL) tablet 500 mg (500 mg Oral Given 10/19/16 1830)  morphine 4 MG/ML injection 4 mg (4 mg Intravenous Given 10/19/16 1827)  sodium chloride 0.9 % bolus 1,000 mL (0 mLs Intravenous Stopped 10/19/16 2033)     Initial Impression / Assessment and Plan / ED Course  I have reviewed the triage vital signs and the nursing notes.  Pertinent labs & imaging results that were available during  my care of the patient were reviewed by me and considered in my medical decision making (see chart for details).     46 yo F with PMHx as above here with ongoing epigastric, recurrent abdominal pain, worse with eating. On arrival, VSS and WNL. Exam as above. Labs/imaging from prior visits reviewed, with note of colon mass on CT scan 4/17 - did not follow-up for this. History, exam most c/w GERD with reflux, gastritis, also must consider intermittent obstruction. No RUQ TTP to suggest cholecystitis. Will check labs, imaging given complicated imaging previously w/o follow-up, and re-assess. Do NOT suspect ACS - EKG non-ischemic and trop negative despite constant sx for >12 hours.  Labs, imaging as above. CBC at baseline without leukocytosis. Renal function, liver function wnl. UA with mild dehydration but no UTI. CT scan as above, remarkable for mild diverticulitis as well as persistent c/f possible colon mass/stricture. She has no signs of perforation or abscess. I discussed these findings with pt. Will place on ABX, d/c with outpt GI follow-up for  colonoscopy. Reiterated importance of close f/u and discussed possibility of CA.  Final Clinical Impressions(s) / ED Diagnoses   Final diagnoses:  Diverticulitis of large intestine without perforation or abscess without bleeding  Abnormal CT scan    New Prescriptions Discharge Medication List as of 10/19/2016  7:29 PM    START taking these medications   Details  ciprofloxacin (CIPRO) 500 MG tablet Take 1 tablet (500 mg total) by mouth 2 (two) times daily., Starting Tue 10/19/2016, Until Fri 10/29/2016, Print    metroNIDAZOLE (FLAGYL) 500 MG tablet Take 1 tablet (500 mg total) by mouth 3 (three) times daily., Starting Tue 10/19/2016, Until Fri 10/29/2016, Print    omeprazole (PRILOSEC) 20 MG capsule Take 1 capsule (20 mg total) by mouth daily., Starting Tue 10/19/2016, Print    ondansetron (ZOFRAN ODT) 4 MG disintegrating tablet Take 1 tablet (4 mg total) by mouth every 8 (eight) hours as needed for nausea or vomiting., Starting Tue 10/19/2016, Print         [name], MD 10/20/16 1043

## 2016-10-19 NOTE — Discharge Instructions (Signed)
-   Follow-up with GI and a primary care doctor in the next 1-2 weeks for your CT findings

## 2016-10-19 NOTE — ED Triage Notes (Signed)
Pt. Coming from work via [employer] for chest pain. Pt. States she has had intermittent chest pain x 6 month. Pt. Uses zantac to relieve her pain. Pt. States last night around 9pm she had chest pain, took zantac, and pain was relieved. She was then woken from her sleep around 0245 with squeezing chest pain to central and left chest. Pt. Denies radiation, SOB, N/V. Pt. States she does not know if she has diabetes, HTN, or high cholesterol. Pt. Hx of crohns, but has not had medication in a year. Pt. Given 324 ASA and 1 nitro en route. Pt. States a cold rag to her chest makes her feel better.

## 2016-10-19 NOTE — ED Notes (Signed)
Pt. Ambulatory to restroom. Pt. Educated on clean catch technique.

## 2016-10-20 ENCOUNTER — Encounter: Payer: Self-pay | Admitting: Gastroenterology

## 2016-10-27 ENCOUNTER — Encounter: Payer: Self-pay | Admitting: Gastroenterology

## 2016-10-29 ENCOUNTER — Ambulatory Visit: Payer: BLUE CROSS/BLUE SHIELD | Admitting: Gastroenterology

## 2016-11-16 ENCOUNTER — Ambulatory Visit: Payer: BLUE CROSS/BLUE SHIELD | Admitting: Gastroenterology

## 2016-12-26 ENCOUNTER — Emergency Department (HOSPITAL_COMMUNITY): Payer: BLUE CROSS/BLUE SHIELD

## 2016-12-26 ENCOUNTER — Encounter (HOSPITAL_COMMUNITY): Payer: Self-pay | Admitting: Emergency Medicine

## 2016-12-26 ENCOUNTER — Emergency Department (HOSPITAL_COMMUNITY)
Admission: EM | Admit: 2016-12-26 | Discharge: 2016-12-26 | Disposition: A | Discharge status: Home / Self Care | Payer: BLUE CROSS/BLUE SHIELD | Attending: Emergency Medicine | Admitting: Emergency Medicine

## 2016-12-26 DIAGNOSIS — Z79899 Other long term (current) drug therapy: Secondary | ICD-10-CM | POA: Diagnosis not present

## 2016-12-26 DIAGNOSIS — F17210 Nicotine dependence, cigarettes, uncomplicated: Secondary | ICD-10-CM | POA: Diagnosis not present

## 2016-12-26 DIAGNOSIS — M25552 Pain in left hip: Secondary | ICD-10-CM | POA: Insufficient documentation

## 2016-12-26 MED ORDER — IBUPROFEN 200 MG PO TABS
600.0000 mg | ORAL_TABLET | Freq: Once | ORAL | Status: AC
  Administered 2016-12-26: 600 mg via ORAL
  Filled 2016-12-26: qty 3

## 2016-12-26 MED ORDER — METHOCARBAMOL 500 MG PO TABS: 500.0000 mg | ORAL_TABLET | Freq: Two times a day (BID) | ORAL | 0 refills | Status: DC

## 2016-12-26 NOTE — ED Triage Notes (Signed)
Pt reports pain/numbness from L hip down to toes. No known injury. Occasionally has catch in her knee when walking

## 2016-12-26 NOTE — ED Provider Notes (Signed)
[facility] DEPT Provider Note   CSN: [id number] Arrival date & time: 12/26/16  1008  By signing my name below, I, Amanda Garner, attest that this documentation has been prepared under the direction and in the presence of Amanda Penninger, PA-C.Marland Kitchen Electronically Signed: Sonum Garner, Education administrator. 12/26/16. 11:50 AM.  History   Chief Complaint Chief Complaint  Patient presents with  . Sciatica    The history is provided by the patient. No language interpreter was used.     HPI Comments: Amanda Garner is a 46 y.o. female who presents to the Emergency Department complaining of constant, worsened left knee pain that progressed to the entire LLE for the past 1.5 weeks. Symptoms began with no inciting event. She states now the pain starts to the left hip with tingling and radiation down the posterior left leg. She notes the pain is worse with certain movements of the LLE. She denies known falls or trauma to the affected area. She has tried [employer] and Tylenol without relief.  She denies numbness, back pain, injury, loss of bowel or bladder function. She denies prior surgery to the LLE or back.   Past Medical History:  Diagnosis Date  . Anemia Dx: 2001 or so   . Blood transfusion   . Diverticulitis   . Sickle cell trait [facility])     Patient Active Problem List   Diagnosis Date Noted  . Tinea pedis of left foot 04/11/2015  . Blister of foot with infection 04/11/2015  . Diverticulitis 09/07/2012  . C. difficile colitis 08/26/2011  . Abdominal pain, other specified site 08/26/2011  . Diarrhea 08/26/2011  . Abnormal finding on GI tract imaging 08/26/2011  . Enteritis 08/25/2011    Past Surgical History:  Procedure Laterality Date  . ABDOMINAL HYSTERECTOMY     partial hysterectomy   . [place]; 1997; 2001    OB History    No data available       Home Medications    Prior to Admission medications   Medication Sig Start Date End Date Taking? Authorizing Provider    amoxicillin (AMOXIL) 500 MG capsule Take 2 capsules (1,000 mg total) by mouth 2 (two) times daily. 07/29/16   [name], NP  clotrimazole-betamethasone (LOTRISONE) cream Apply to affected area 2 times daily prn 07/29/16   [name], NP  HYDROcodone-acetaminophen (NORCO/VICODIN) 5-325 MG tablet Take 1-2 tablets by mouth every 4 (four) hours as needed for severe pain. 10/19/16   [name], MD  methocarbamol (ROBAXIN) 500 MG tablet Take 1 tablet (500 mg total) by mouth 2 (two) times daily. 12/26/16   [name], PA-C  omeprazole (PRILOSEC) 20 MG capsule Take 1 capsule (20 mg total) by mouth daily. 10/19/16   [name], MD  ondansetron (ZOFRAN ODT) 4 MG disintegrating tablet Take 1 tablet (4 mg total) by mouth every 8 (eight) hours as needed for nausea or vomiting. 10/19/16   [name], MD    Family History Family History  Problem Relation Age of Onset  . Diabetes type II Mother   . Diabetes type II Father   . Stroke Father     Social History Social History  Substance Use Topics  . Smoking status: Current Some Day Smoker    Packs/day: 0.50    Years: 15.00    Types: Cigarettes    Last attempt to quit: 08/24/2011  . Smokeless tobacco: Never Used  . Alcohol use 0.0 oz/week     Comment: occasionally  Allergies   Patient has no known allergies.   Review of Systems Review of Systems  Musculoskeletal: Positive for arthralgias, joint swelling and myalgias. Negative for back pain.  Neurological: Positive for weakness. Negative for numbness.     Physical Exam Updated Vital Signs BP (!) 151/109   Pulse 90   Temp 98.1 F (36.7 C)   Resp 19   LMP 08/20/2011   SpO2 100%   Physical Exam  Constitutional: She appears well-developed and well-nourished. No distress.  HENT:  Head: Normocephalic and atraumatic.  Eyes: Conjunctivae and EOM are normal. Right eye exhibits no discharge. Left eye exhibits no discharge. No scleral icterus.  Neck: Normal range of motion.   Cardiovascular: Normal rate, regular rhythm, normal heart sounds and intact distal pulses.   Pulmonary/Chest: Effort normal and breath sounds normal. No respiratory distress. She has no wheezes. She has no rales.  Musculoskeletal: Normal range of motion. She exhibits no edema or deformity.  Very mild swelling of the left knee. No temperature changes, no erythema. Full passive ROM of the knee. Good distal pulses. No calf tenderness. No bruising or wounds noted.   Neurological: She is alert. No sensory deficit.  Skin: Skin is warm and dry. No rash noted. She is not diaphoretic. No erythema.  Psychiatric: She has a normal mood and affect.  Nursing note and vitals reviewed.    ED Treatments / Results  DIAGNOSTIC STUDIES: Oxygen Saturation is 100% on RA, normal by my interpretation.    COORDINATION OF CARE: 11:49 AM Discussed treatment plan with pt at bedside and pt agreed to plan.  Labs (all labs ordered are listed, but only abnormal results are displayed) Labs Reviewed - No data to display  EKG  EKG Interpretation None       Radiology Dg Knee Complete 4 Views Left  Result Date: 12/26/2016 CLINICAL DATA:  Left knee pain for 2 weeks without known injury. EXAM: LEFT KNEE - COMPLETE 4+ VIEW COMPARISON:  None. FINDINGS: No evidence of fracture, dislocation, or joint effusion. No evidence of arthropathy or other focal bone abnormality. Soft tissues are unremarkable. IMPRESSION: Normal left knee. Electronically Signed   By: Marijo Conception, M.D.   On: 12/26/2016 11:23    Procedures Procedures (including critical care time)  Medications Ordered in ED Medications  ibuprofen (ADVIL,MOTRIN) tablet 600 mg (not administered)     Initial Impression / Assessment and Plan / ED Course  I have reviewed the triage vital signs and the nursing notes.  Pertinent labs & imaging results that were available during my care of the patient were reviewed by me and considered in my medical decision  making (see chart for details).     Patient's history and symptoms concerning for sciatica versus musculoskeletal strain. There is a small amount of swelling on the left knee. No concern for septic joint or gout at this time considering appearance, risk factors, no temperature changes, no site of inoculation. X-ray of the left knee was obtained and showed no evidence of fracture, dislocation or joint effusion. No abnormalities of the soft tissue noted. Hip and leg pain consistent with sciatica. No concern for any other acute spinal cord injury or process at this time. Denies any back or midline spinal pain. Patient was encouraged to schedule appointment with orthopedics for further evaluation. Will give Robaxin and advised her to continue anti-inflammatories as needed. Return precautions given.  Final Clinical Impressions(s) / ED Diagnoses   Final diagnoses:  Left hip pain  New Prescriptions New Prescriptions   METHOCARBAMOL (ROBAXIN) 500 MG TABLET    Take 1 tablet (500 mg total) by mouth 2 (two) times daily.   I personally performed the services described in this documentation, which was scribed in my presence. The recorded information has been reviewed and is accurate.    Delia Heady, PA-C 12/26/16 1202    Virgel Manifold, MD 12/27/16 [phone]

## 2016-12-26 NOTE — Discharge Instructions (Signed)
Take Robaxin as directed.  Take ibuprofen or Aleve as needed for pain.  Schedule appointment with orthopedist listed below for further evaluation. Return to ED for worsening pain, numbness, weakness, trouble walking, additional injury to the area, fevers.

## 2016-12-26 NOTE — ED Notes (Signed)
Bed: WTR9 Expected date:  Expected time:  Means of arrival:  Comments:

## 2017-05-11 ENCOUNTER — Encounter (HOSPITAL_COMMUNITY): Payer: Self-pay | Admitting: *Deleted

## 2017-05-11 ENCOUNTER — Inpatient Hospital Stay (HOSPITAL_COMMUNITY)
Admission: EM | Admit: 2017-05-11 | Discharge: 2017-05-13 | DRG: 392 | Disposition: A | Discharge status: Home / Self Care | Payer: BLUE CROSS/BLUE SHIELD | Attending: Family Medicine | Admitting: Family Medicine

## 2017-05-11 ENCOUNTER — Emergency Department (HOSPITAL_COMMUNITY): Payer: BLUE CROSS/BLUE SHIELD

## 2017-05-11 DIAGNOSIS — K5792 Diverticulitis of intestine, part unspecified, without perforation or abscess without bleeding: Secondary | ICD-10-CM

## 2017-05-11 DIAGNOSIS — F1210 Cannabis abuse, uncomplicated: Secondary | ICD-10-CM | POA: Diagnosis present

## 2017-05-11 DIAGNOSIS — K5732 Diverticulitis of large intestine without perforation or abscess without bleeding: Secondary | ICD-10-CM | POA: Diagnosis present

## 2017-05-11 DIAGNOSIS — K5900 Constipation, unspecified: Secondary | ICD-10-CM | POA: Diagnosis present

## 2017-05-11 DIAGNOSIS — D573 Sickle-cell trait: Secondary | ICD-10-CM | POA: Diagnosis present

## 2017-05-11 DIAGNOSIS — E669 Obesity, unspecified: Secondary | ICD-10-CM | POA: Diagnosis present

## 2017-05-11 DIAGNOSIS — K5720 Diverticulitis of large intestine with perforation and abscess without bleeding: Principal | ICD-10-CM | POA: Diagnosis present

## 2017-05-11 DIAGNOSIS — K2100 Gastro-esophageal reflux disease with esophagitis, without bleeding: Secondary | ICD-10-CM | POA: Diagnosis present

## 2017-05-11 DIAGNOSIS — Z823 Family history of stroke: Secondary | ICD-10-CM | POA: Diagnosis not present

## 2017-05-11 DIAGNOSIS — Z6830 Body mass index (BMI) 30.0-30.9, adult: Secondary | ICD-10-CM | POA: Diagnosis not present

## 2017-05-11 DIAGNOSIS — F17210 Nicotine dependence, cigarettes, uncomplicated: Secondary | ICD-10-CM | POA: Diagnosis present

## 2017-05-11 DIAGNOSIS — I10 Essential (primary) hypertension: Secondary | ICD-10-CM | POA: Diagnosis not present

## 2017-05-11 DIAGNOSIS — F17200 Nicotine dependence, unspecified, uncomplicated: Secondary | ICD-10-CM | POA: Diagnosis not present

## 2017-05-11 DIAGNOSIS — Z833 Family history of diabetes mellitus: Secondary | ICD-10-CM

## 2017-05-11 HISTORY — DX: Crohn's disease, unspecified, without complications: K50.90

## 2017-05-11 HISTORY — DX: Essential (primary) hypertension: I10

## 2017-05-11 LAB — CBC
HCT: 40.5 % (ref 36.0–46.0)
HEMOGLOBIN: 13.4 g/dL (ref 12.0–15.0)
MCH: 22.2 pg — AB (ref 26.0–34.0)
MCHC: 33.1 g/dL (ref 30.0–36.0)
MCV: 67.2 fL — ABNORMAL LOW (ref 78.0–100.0)
PLATELETS: 245 10*3/uL (ref 150–400)
RBC: 6.03 MIL/uL — ABNORMAL HIGH (ref 3.87–5.11)
RDW: 14.9 % (ref 11.5–15.5)
WBC: 8.6 10*3/uL (ref 4.0–10.5)

## 2017-05-11 LAB — URINALYSIS, MICROSCOPIC (REFLEX)

## 2017-05-11 LAB — COMPREHENSIVE METABOLIC PANEL
ALT: 12 U/L — AB (ref 14–54)
ANION GAP: 9 (ref 5–15)
AST: 16 U/L (ref 15–41)
Albumin: 3.8 g/dL (ref 3.5–5.0)
Alkaline Phosphatase: 61 U/L (ref 38–126)
BILIRUBIN TOTAL: 0.5 mg/dL (ref 0.3–1.2)
BUN: 6 mg/dL (ref 6–20)
CALCIUM: 9.1 mg/dL (ref 8.9–10.3)
CO2: 21 mmol/L — ABNORMAL LOW (ref 22–32)
CREATININE: 0.63 mg/dL (ref 0.44–1.00)
Chloride: 107 mmol/L (ref 101–111)
GFR calc non Af Amer: >60 mL/min (ref >60)
Glucose, Bld: 116 mg/dL — ABNORMAL HIGH (ref 65–99)
Potassium: 3.5 mmol/L (ref 3.5–5.1)
Sodium: 137 mmol/L (ref 135–145)
TOTAL PROTEIN: 7.1 g/dL (ref 6.5–8.1)

## 2017-05-11 LAB — URINALYSIS, ROUTINE W REFLEX MICROSCOPIC
BILIRUBIN URINE: NEGATIVE
Glucose, UA: NEGATIVE mg/dL
Ketones, ur: NEGATIVE mg/dL
Leukocytes, UA: NEGATIVE
NITRITE: NEGATIVE
PROTEIN: NEGATIVE mg/dL
pH: 6 (ref 5.0–8.0)

## 2017-05-11 LAB — LIPASE, BLOOD: Lipase: 26 U/L (ref 11–51)

## 2017-05-11 MED ORDER — IOPAMIDOL (ISOVUE-300) INJECTION 61%
INTRAVENOUS | Status: AC
  Administered 2017-05-11: 100 mL
  Filled 2017-05-11: qty 100

## 2017-05-11 MED ORDER — ACETAMINOPHEN 325 MG PO TABS: 650.0000 mg | ORAL_TABLET | Freq: Four times a day (QID) | ORAL | Status: DC | PRN

## 2017-05-11 MED ORDER — LACTATED RINGERS IV SOLN
INTRAVENOUS | Status: DC
  Administered 2017-05-11 – 2017-05-13 (×2): via INTRAVENOUS

## 2017-05-11 MED ORDER — MORPHINE SULFATE (PF) 4 MG/ML IV SOLN
2.0000 mg | INTRAVENOUS | Status: DC | PRN
  Administered 2017-05-11 (×2): 2 mg via INTRAVENOUS
  Filled 2017-05-11 (×2): qty 1

## 2017-05-11 MED ORDER — PANTOPRAZOLE SODIUM 40 MG IV SOLR
40.0000 mg | Freq: Two times a day (BID) | INTRAVENOUS | Status: DC
  Administered 2017-05-11 – 2017-05-12 (×2): 40 mg via INTRAVENOUS
  Filled 2017-05-11 (×2): qty 40

## 2017-05-11 MED ORDER — ONDANSETRON HCL 4 MG PO TABS: 4.0000 mg | ORAL_TABLET | Freq: Four times a day (QID) | ORAL | Status: DC | PRN

## 2017-05-11 MED ORDER — NICOTINE 7 MG/24HR TD PT24
7.0000 mg | MEDICATED_PATCH | Freq: Every day | TRANSDERMAL | Status: DC
Start: 2017-05-12 — End: 2017-05-13
  Administered 2017-05-12: 7 mg via TRANSDERMAL
  Filled 2017-05-11 (×2): qty 1

## 2017-05-11 MED ORDER — FAMOTIDINE 20 MG PO TABS
20.0000 mg | ORAL_TABLET | Freq: Two times a day (BID) | ORAL | Status: DC | PRN
  Administered 2017-05-11 – 2017-05-12 (×2): 20 mg via ORAL
  Filled 2017-05-11 (×2): qty 1

## 2017-05-11 MED ORDER — ACETAMINOPHEN 650 MG RE SUPP: 650.0000 mg | Freq: Four times a day (QID) | RECTAL | Status: DC | PRN

## 2017-05-11 MED ORDER — HYDRALAZINE HCL 20 MG/ML IJ SOLN: 5.0000 mg | INTRAMUSCULAR | Status: DC | PRN

## 2017-05-11 MED ORDER — MORPHINE SULFATE (PF) 4 MG/ML IV SOLN
4.0000 mg | Freq: Once | INTRAVENOUS | Status: AC
  Administered 2017-05-11: 4 mg via INTRAVENOUS
  Filled 2017-05-11: qty 1

## 2017-05-11 MED ORDER — ALUM & MAG HYDROXIDE-SIMETH 200-200-20 MG/5ML PO SUSP
30.0000 mL | ORAL | Status: DC | PRN
Start: 2017-05-11 — End: 2017-05-13
  Administered 2017-05-11: 30 mL via ORAL
  Filled 2017-05-11 (×2): qty 30

## 2017-05-11 MED ORDER — ONDANSETRON HCL 4 MG/2ML IJ SOLN: 4.0000 mg | Freq: Four times a day (QID) | INTRAMUSCULAR | Status: DC | PRN

## 2017-05-11 MED ORDER — ONDANSETRON HCL 4 MG/2ML IJ SOLN
4.0000 mg | Freq: Once | INTRAMUSCULAR | Status: AC
  Administered 2017-05-11: 4 mg via INTRAVENOUS
  Filled 2017-05-11: qty 2

## 2017-05-11 MED ORDER — IOPAMIDOL (ISOVUE-300) INJECTION 61%
INTRAVENOUS | Status: AC
  Filled 2017-05-11: qty 30

## 2017-05-11 MED ORDER — PIPERACILLIN-TAZOBACTAM 3.375 G IVPB
3.3750 g | Freq: Three times a day (TID) | INTRAVENOUS | Status: DC
  Administered 2017-05-12 – 2017-05-13 (×4): 3.375 g via INTRAVENOUS
  Filled 2017-05-11 (×6): qty 50

## 2017-05-11 MED ORDER — PIPERACILLIN-TAZOBACTAM 3.375 G IVPB 30 MIN
3.3750 g | Freq: Once | INTRAVENOUS | Status: AC
  Administered 2017-05-11: 3.375 g via INTRAVENOUS
  Filled 2017-05-11: qty 50

## 2017-05-11 NOTE — H&P (Signed)
History and Physical    Amanda Garner KZS:[id number] DOB: 1971-06-18 DOA: 05/11/2017  PCP: No Pcp, Per Patient (Inactive) Consultants:  None Patient coming from:  Home - lives with husband; NOK: Husband, [phone]  Chief Complaint: abdominal pain  HPI: Amanda Garner is a 46 y.o. female with medical history significant of HTN, colitis/diverticuluitis; obesity; and sickle cell trait presenting with abdominal pain.  Patient started with a knot in her lower abdomen with sharp pains coming through it.  It started Saturday and she thought it was a flare up from her usual diverticulitis.  She hurt all day Tuesday while at work.  She got off last night, took a Dulcolax x 2 without BM until today.  Ongoing pain through the night.  Got up about 0400 and decided to come to the ER.  Subjective fever last night.  Nauseated without vomiting.  She takes ranitidine 2 bottles OTC weekly because "it feels like I'm having a heart attack" after eating.  Takes 2 randitidine and a cap ful of vinegar and then drinks water behind it.  Last year, she was told that she had a colovesical fistula and she would need surgery, but she didn't have the surgery because she lost her insurance.  She was also told she had Crohn's in the past but never had a colonoscopy for diagnostic purposes.   ED Course: Acute complicated diverticulitis with abscess.  Non-toxic.  General surgery to consult.  Review of Systems: As per HPI; otherwise review of systems reviewed and negative.   Ambulatory Status:  Ambulates without assistance  Past Medical History:  Diagnosis Date  . Anemia Dx: 2001 or so   . Blood transfusion   . Crohn disease ([place])   . Diverticulitis   . Hypertension   . Sickle cell trait [facility])     Past Surgical History:  Procedure Laterality Date  . ABDOMINAL HYSTERECTOMY  2001   partial hysterectomy - emergency during last C-section  . [place]; 1997; 2001    Social History   Social  History  . Marital status: Married    Spouse name: N/A  . Number of children: 3  . Years of education: N/A   Occupational History  . [employer]   Social History Main Topics  . Smoking status: Current Some Day Smoker    Packs/day: 0.30    Years: 31.00    Types: Cigarettes    Last attempt to quit: 08/24/2011  . Smokeless tobacco: Never Used     Comment: needs a patch  . Alcohol use 0.0 oz/week     Comment: occasionally   . Drug use: Yes    Types: Marijuana     Comment: occasional use, last use Saturday  . Sexual activity: Yes   Other Topics Concern  . Not on file   Social History Narrative  . No narrative on file    No Known Allergies  Family History  Problem Relation Age of Onset  . Diabetes type II Mother   . Diabetes type II Father   . Stroke Father     Prior to Admission medications   Medication Sig Start Date End Date Taking? Authorizing Provider  clotrimazole-betamethasone (LOTRISONE) cream Apply to affected area 2 times daily prn Patient not taking: Reported on 05/11/2017 07/29/16   [name], NP  HYDROcodone-acetaminophen (NORCO/VICODIN) 5-325 MG tablet Take 1-2 tablets by mouth every 4 (four) hours as needed for severe  pain. Patient not taking: Reported on 05/11/2017 10/19/16   [name], MD  methocarbamol (ROBAXIN) 500 MG tablet Take 1 tablet (500 mg total) by mouth 2 (two) times daily. Patient not taking: Reported on 05/11/2017 12/26/16   Delia Heady, PA-C  omeprazole (PRILOSEC) 20 MG capsule Take 1 capsule (20 mg total) by mouth daily. Patient not taking: Reported on 05/11/2017 10/19/16   [name], MD  ondansetron (ZOFRAN ODT) 4 MG disintegrating tablet Take 1 tablet (4 mg total) by mouth every 8 (eight) hours as needed for nausea or vomiting. Patient not taking: Reported on 05/11/2017 10/19/16   [name], MD    Physical Exam: Vitals:   05/11/17 1800 05/11/17 1900 05/11/17  2000 05/11/17 2100  BP: 138/88 (!) 151/101 (!) 148/98 (!) 154/88  Pulse: 71 72 71 77  Resp:    19  Temp:    98.6 F (37 C)  TempSrc:    Oral  SpO2: 99% 99% 100% 99%  Weight:    86.2 kg (190 lb 1.6 oz)  Height:    5' 6"  (1.676 m)     General: Appears calm and comfortable and is NAD Eyes:  PERRL, EOMI, normal lids, iris ENT:  grossly normal hearing, lips & tongue, mmm; appropriate dentition Neck:  no LAD, masses or thyromegaly; no carotid bruits Cardiovascular:  RRR, no m/r/g. No LE edema.  Respiratory:   CTA bilaterally with no wheezes/rales/rhonchi.  Normal respiratory effort. Abdomen:  soft, ND, NABS.  She has a large pannus and points to a very specific area very low on her abdomen below the pannus in which she identifies a nodularity as her location of pain. Back:   normal alignment, no CVAT Skin:  no rash or induration seen on limited exam Musculoskeletal:  grossly normal tone BUE/BLE, good ROM, no bony abnormality Lower extremity:  No LE edema.  Limited foot exam with no ulcerations.  2+ distal pulses. Psychiatric:  grossly normal mood and affect, speech fluent and appropriate, AOx3 Neurologic:  CN 2-12 grossly intact, moves all extremities in coordinated fashion, sensation intact    Radiological Exams on Admission: Ct Abdomen Pelvis W Contrast  Result Date: 05/11/2017 CLINICAL DATA:  46 year old with constipation and left lower quadrant pain. History of Crohn's disease. EXAM: CT ABDOMEN AND PELVIS WITH CONTRAST TECHNIQUE: Multidetector CT imaging of the abdomen and pelvis was performed using the standard protocol following bolus administration of intravenous contrast. CONTRAST:  133m ISOVUE-300 IOPAMIDOL (ISOVUE-300) INJECTION 61% COMPARISON:  10/19/2016 FINDINGS: Lower chest: Lung bases are clear. Hepatobiliary: Decreased attenuation of the liver is compatible with steatosis. No focal liver lesion. No significant biliary dilatation. Portal venous system appears to be patent.  Normal appearance of the gallbladder. Pancreas: Normal appearance of the pancreas without inflammation or duct dilatation. Spleen: Normal appearance of spleen without enlargement. Adrenals/Urinary Tract: Normal adrenal glands. No suspicious renal lesions and no hydronephrosis. Stomach/Bowel: Normal appearance of stomach and duodenum. Normal appearance of small bowel without dilatation. There is a extensive diverticulosis involving the sigmoid colon with wall thickening along the posterior wall of the sigmoid colon on sequence 3, image 69. Adjacent to this wall thickening, there is a small air-fluid collection compatible with a pericolonic abscess. This abscess measures 2.5 x 2.2 x 2.6 cm. Mild stranding along the left side of the pelvis adjacent to this small abscess. This abscess is surrounded by the anterior aspect of the uterus, posterior dome of the bladder and the posterior wall of the sigmoid colon. There is no definite  gas within the uterus or urinary bladder. There is oral contrast within the sigmoid colon but there is no contrast within this small abscess cavity. Normal appearance of the appendix. Vascular/Lymphatic: No significant atherosclerotic disease in the abdomen or pelvis. Again noted is a prominent lymph node in the right posterior mesentery on sequence 3, image 49 measuring 1.1 cm in short axis and stable. Overall, there is not significant lymph node enlargement the abdomen and pelvis. Few prominent left periaortic lymph nodes. Reproductive: Small air-fluid collection just anterior to the uterus is compatible with an abscess. There is no definite gas tracking into the uterus itself. Left ovary is slightly prominent but stable. Normal appearance of the right adnexa. Other: No significant free fluid.  No free intraperitoneal air. Musculoskeletal: No acute abnormality. IMPRESSION: Sigmoid colon diverticulitis with a pericolonic abscess. The abscess is located within the central aspect of the pelvis  and adjacent to the anterior wall of the uterus, posterior bladder dome and the posterior wall of the sigmoid colon. Abscess measures up to 2.6 cm. This small abscess is not amendable to percutaneous drainage. Hepatic steatosis. Electronically Signed   By: Markus Daft M.D.   On: 05/11/2017 17:42    EKG: Not done   Labs on Admission: I have personally reviewed the available labs and imaging studies at the time of the admission.  Pertinent labs:   Glucose 116 WBC 8.6 UA: trace Hgb, SG >1.030, rare bacteria   Assessment/Plan Principal Problem:   Sigmoid diverticulitis Active Problems:   Tobacco dependence   Marijuana abuse   Essential hypertension   Sigmoid diverticulitis with abscess -Patient's symptoms are c/w diverticulitis and her CT supports this as a diagnosis -Patient with h/o sigmoid diverticulitis in the same location based on prior CT scans available in Care Everywhere -There appears to have been concern for colovesical fistula in the past and possibly also colovaginal fistula -I do not see evidence of concern for Crohn's other than the possibility of fistula -Patient currently presenting with abdominal pain and findings similar to prior presentations -She has been seen by surgery and there does not appear to be need for surgery at this time -CT should be repeated in 3-5 days as per surgery -For now, will give bowel rest other than clear liquids, IVF, pain medication with morphine, nausea medication with Zofran, and treat with Zosyn for intraabdominal infection -Suggest outpatient GI evaluation for colonoscopy if not done as an inpatient - although given her lack of PCP and poor outpatient f/u in the past, inpatient GI evaluation is likely reasonable  Reflux -Patient with what sounds like long-standing reflux likely causing gastritis/esophagitis and/or ulcer -She does not see doctors and so has been taking raniditine and using 2 bottles/week -She reports that PPI therapy  did not work for her in the past -Patient educated about use of PPI vs. H2 blockers -Will start IV Protonix BID -Continue H2 blocker prn -May need inpatient vs. outpatient GI evaluation including possible need for EGD  HTN -Reported h/o -Not on medications -Suboptimal control while in the ER -Will order prn IV hydralazine -Consider medication at the time of discharge if ongoing need is evident  Tobacco dependence -Encourage cessation.  This was discussed with the patient and should be reviewed on an ongoing basis.   -Patch ordered at patient request.  Marijuana abuse -Cessation encouraged; this should be encouraged on an ongoing basis -UDS ordered  DVT prophylaxis:  SCDs (in case of need for surgery) Code Status:  Full -  confirmed with patient Family Communication: None present  Disposition Plan:  Home once clinically improved Consults called: Surgery  Admission status: Admit - It is my clinical opinion that admission to INPATIENT is reasonable and necessary because this patient will require at least 2 midnights in the hospital to treat this condition based on the medical complexity of the problems presented.  Given the aforementioned information, the predictability of an adverse outcome is felt to be significant.    Karmen Bongo MD [facility]  If note is complete, please contact covering daytime or nighttime physician. [email or web address] Password [email or web address]  05/11/2017, 10:02 PM

## 2017-05-11 NOTE — ED Provider Notes (Signed)
[facility] DEPT Provider Note   CSN: [id number] Arrival date & time: 05/11/17  4034     History   Chief Complaint Chief Complaint  Patient presents with  . Constipation  . Abdominal Pain  . Hypertension    HPI Amanda Garner is a 46 y.o. female.  HPI   46 year old female presents today with complaints of left lower quadrant abdominal pain.  Patient notes symptoms started 5 days ago with abdominal pain, nausea.  She notes no fever at home but had chills last night, she notes 3 days of constipation with a small bowel movement today after taking Dulcolax stool softeners.  Patient denies any diarrhea, she reports a history of diverticulitis in the past.  No medications prior to arrival here in the ED.   Past Medical History:  Diagnosis Date  . Anemia Dx: 2001 or so   . Blood transfusion   . Crohn disease ([place])   . Diverticulitis   . Hypertension   . Sickle cell trait [facility])     Patient Active Problem List   Diagnosis Date Noted  . Tinea pedis of left foot 04/11/2015  . Blister of foot with infection 04/11/2015  . Diverticulitis 09/07/2012  . C. difficile colitis 08/26/2011  . Abdominal pain, other specified site 08/26/2011  . Diarrhea 08/26/2011  . Abnormal finding on GI tract imaging 08/26/2011  . Enteritis 08/25/2011    Past Surgical History:  Procedure Laterality Date  . ABDOMINAL HYSTERECTOMY     partial hysterectomy   . [place]; 1997; 2001    OB History    No data available       Home Medications    Prior to Admission medications   Medication Sig Start Date End Date Taking? Authorizing Provider  clotrimazole-betamethasone (LOTRISONE) cream Apply to affected area 2 times daily prn Patient not taking: Reported on 05/11/2017 07/29/16   [name], NP  HYDROcodone-acetaminophen (NORCO/VICODIN) 5-325 MG tablet Take 1-2 tablets by mouth every 4 (four) hours as needed for severe pain. Patient not taking: Reported on 05/11/2017 10/19/16    [name], MD  methocarbamol (ROBAXIN) 500 MG tablet Take 1 tablet (500 mg total) by mouth 2 (two) times daily. Patient not taking: Reported on 05/11/2017 12/26/16   Delia Heady, PA-C  omeprazole (PRILOSEC) 20 MG capsule Take 1 capsule (20 mg total) by mouth daily. Patient not taking: Reported on 05/11/2017 10/19/16   [name], MD  ondansetron (ZOFRAN ODT) 4 MG disintegrating tablet Take 1 tablet (4 mg total) by mouth every 8 (eight) hours as needed for nausea or vomiting. Patient not taking: Reported on 05/11/2017 10/19/16   [name], MD    Family History Family History  Problem Relation Age of Onset  . Diabetes type II Mother   . Diabetes type II Father   . Stroke Father     Social History Social History  Substance Use Topics  . Smoking status: Current Some Day Smoker    Packs/day: 0.50    Years: 15.00    Types: Cigarettes    Last attempt to quit: 08/24/2011  . Smokeless tobacco: Never Used  . Alcohol use 0.0 oz/week     Comment: occasionally      Allergies   Patient has no known allergies.   Review of Systems Review of Systems  All other systems reviewed and are negative.   Physical Exam Updated Vital Signs BP 138/88   Pulse 71   Temp 98.2 F (36.8 C) (Oral)  Resp 16   LMP 08/20/2011   SpO2 99%   Physical Exam  Constitutional: She is oriented to person, place, and time. She appears well-developed and well-nourished.  HENT:  Head: Normocephalic and atraumatic.  Eyes: Pupils are equal, round, and reactive to light. Conjunctivae are normal. Right eye exhibits no discharge. Left eye exhibits no discharge. No scleral icterus.  Neck: Normal range of motion. No JVD present. No tracheal deviation present.  Pulmonary/Chest: Effort normal. No stridor.  Abdominal:  TTP LLQ  Neurological: She is alert and oriented to person, place, and time. Coordination normal.  Psychiatric: She has a normal mood and affect. Her behavior is normal. Judgment and thought  content normal.  Nursing note and vitals reviewed.   ED Treatments / Results  Labs (all labs ordered are listed, but only abnormal results are displayed) Labs Reviewed  COMPREHENSIVE METABOLIC PANEL - Abnormal; Notable for the following:       Result Value   CO2 21 (*)    Glucose, Bld 116 (*)    ALT 12 (*)    All other components within normal limits  CBC - Abnormal; Notable for the following:    RBC 6.03 (*)    MCV 67.2 (*)    MCH 22.2 (*)    All other components within normal limits  URINALYSIS, ROUTINE W REFLEX MICROSCOPIC - Abnormal; Notable for the following:    APPearance HAZY (*)    Specific Gravity, Urine >1.030 (*)    Hgb urine dipstick TRACE (*)    All other components within normal limits  URINALYSIS, MICROSCOPIC (REFLEX) - Abnormal; Notable for the following:    Bacteria, UA RARE (*)    Squamous Epithelial / LPF 0-5 (*)    All other components within normal limits  LIPASE, BLOOD    EKG  EKG Interpretation None       Radiology Ct Abdomen Pelvis W Contrast  Result Date: 05/11/2017 CLINICAL DATA:  46 year old with constipation and left lower quadrant pain. History of Crohn's disease. EXAM: CT ABDOMEN AND PELVIS WITH CONTRAST TECHNIQUE: Multidetector CT imaging of the abdomen and pelvis was performed using the standard protocol following bolus administration of intravenous contrast. CONTRAST:  112m ISOVUE-300 IOPAMIDOL (ISOVUE-300) INJECTION 61% COMPARISON:  10/19/2016 FINDINGS: Lower chest: Lung bases are clear. Hepatobiliary: Decreased attenuation of the liver is compatible with steatosis. No focal liver lesion. No significant biliary dilatation. Portal venous system appears to be patent. Normal appearance of the gallbladder. Pancreas: Normal appearance of the pancreas without inflammation or duct dilatation. Spleen: Normal appearance of spleen without enlargement. Adrenals/Urinary Tract: Normal adrenal glands. No suspicious renal lesions and no hydronephrosis.  Stomach/Bowel: Normal appearance of stomach and duodenum. Normal appearance of small bowel without dilatation. There is a extensive diverticulosis involving the sigmoid colon with wall thickening along the posterior wall of the sigmoid colon on sequence 3, image 69. Adjacent to this wall thickening, there is a small air-fluid collection compatible with a pericolonic abscess. This abscess measures 2.5 x 2.2 x 2.6 cm. Mild stranding along the left side of the pelvis adjacent to this small abscess. This abscess is surrounded by the anterior aspect of the uterus, posterior dome of the bladder and the posterior wall of the sigmoid colon. There is no definite gas within the uterus or urinary bladder. There is oral contrast within the sigmoid colon but there is no contrast within this small abscess cavity. Normal appearance of the appendix. Vascular/Lymphatic: No significant atherosclerotic disease in the abdomen or pelvis. Again  noted is a prominent lymph node in the right posterior mesentery on sequence 3, image 49 measuring 1.1 cm in short axis and stable. Overall, there is not significant lymph node enlargement the abdomen and pelvis. Few prominent left periaortic lymph nodes. Reproductive: Small air-fluid collection just anterior to the uterus is compatible with an abscess. There is no definite gas tracking into the uterus itself. Left ovary is slightly prominent but stable. Normal appearance of the right adnexa. Other: No significant free fluid.  No free intraperitoneal air. Musculoskeletal: No acute abnormality. IMPRESSION: Sigmoid colon diverticulitis with a pericolonic abscess. The abscess is located within the central aspect of the pelvis and adjacent to the anterior wall of the uterus, posterior bladder dome and the posterior wall of the sigmoid colon. Abscess measures up to 2.6 cm. This small abscess is not amendable to percutaneous drainage. Hepatic steatosis. Electronically Signed   By: Markus Daft M.D.   On:  05/11/2017 17:42    Procedures Procedures (including critical care time)  Medications Ordered in ED Medications  iopamidol (ISOVUE-300) 61 % injection (not administered)  piperacillin-tazobactam (ZOSYN) IVPB 3.375 g (not administered)  morphine 4 MG/ML injection 4 mg (4 mg Intravenous Given 05/11/17 1319)  ondansetron (ZOFRAN) injection 4 mg (4 mg Intravenous Given 05/11/17 1318)  iopamidol (ISOVUE-300) 61 % injection (100 mLs  Contrast Given 05/11/17 1702)  morphine 4 MG/ML injection 4 mg (4 mg Intravenous Given 05/11/17 1808)     Initial Impression / Assessment and Plan / ED Course  I have reviewed the triage vital signs and the nursing notes.  Pertinent labs & imaging results that were available during my care of the patient were reviewed by me and considered in my medical decision making (see chart for details).      Final Clinical Impressions(s) / ED Diagnoses   Final diagnoses:  Diverticulitis   Labs:  UA, Lipase, CMP, CBC,  Imaging: CT abd pelvis   Consults: Dr. Brantley Stage General surgery  Therapeutics: Morphine,  Discharge Meds: Zosyn  Assessment/Plan: 46 year old female presents today with acute complicated diverticulitis.  Patient has an abscess.  She is well appearing, afebrile and nontoxic.  Due to comp gating features and ongoing pain hospitalist service consulted for hospital admission.  I also spoke with general surgery who reports they would personally evaluate the patient but a management would be appropriate at this time.   New Prescriptions New Prescriptions   No medications on file     Francee Gentile 05/11/17 1849    Daleen Bo, MD 05/12/17 [phone]

## 2017-05-11 NOTE — ED Notes (Signed)
Attempted blood draw with no success left AC

## 2017-05-11 NOTE — ED Triage Notes (Addendum)
Pt reports constipation, last bm was Sunday. LLQ pain started on Saturday and pt "feels like there is a knot there." denies n/v. Took laxatives with no relief. Has hx of crohns. Hypertensive at triage. States hx of same but does not take meds.

## 2017-05-11 NOTE — ED Notes (Signed)
Pt came to desk and was wondering about the wait. Advised her of process.

## 2017-05-11 NOTE — ED Notes (Signed)
Pt drinking oral contrast and will notify RN when finished.

## 2017-05-11 NOTE — Consult Note (Signed)
Reason for Consult diverticulitis Referring Physician: [name] is an 46 y.o. female.  HPI: Asked to see the patient at the request of Dr. Eulis Foster for diverticulitis. The patient has a four-day history of lower abdominal pain. The pain is chronic. It is constant. It is sharp and crampy in nature. She has a history of Crohn's disease but is under no current therapy. CT scan shows an area of acute diverticulitis with small mesenteric abscess. She is nontoxic. He is hungry.  Past Medical History:  Diagnosis Date  . Anemia Dx: 2001 or so   . Blood transfusion   . Crohn disease ([place])   . Diverticulitis   . Hypertension   . Sickle cell trait [facility])     Past Surgical History:  Procedure Laterality Date  . ABDOMINAL HYSTERECTOMY     partial hysterectomy   . [place]; 1997; 2001    Family History  Problem Relation Age of Onset  . Diabetes type II Mother   . Diabetes type II Father   . Stroke Father     Social History:  reports that she has been smoking Cigarettes.  She has a 7.50 pack-year smoking history. She has never used smokeless tobacco. She reports that she drinks alcohol. She reports that she does not use drugs.  Allergies: No Known Allergies  Medications: I have reviewed the patient's current medications.  Results for orders placed or performed during the hospital encounter of 05/11/17 (from the past 48 hour(s))  Lipase, blood     Status: None   Collection Time: 05/11/17  8:24 AM  Result Value Ref Range   Lipase 26 11 - 51 U/L  Comprehensive metabolic panel     Status: Abnormal   Collection Time: 05/11/17  8:24 AM  Result Value Ref Range   Sodium 137 135 - 145 mmol/L   Potassium 3.5 3.5 - 5.1 mmol/L   Chloride 107 101 - 111 mmol/L   CO2 21 (L) 22 - 32 mmol/L   Glucose, Bld 116 (H) 65 - 99 mg/dL   BUN 6 6 - 20 mg/dL   Creatinine, Ser 0.63 0.44 - 1.00 mg/dL   Calcium 9.1 8.9 - 10.3 mg/dL   Total Protein 7.1 6.5 - 8.1 g/dL   Albumin 3.8  3.5 - 5.0 g/dL   AST 16 15 - 41 U/L   ALT 12 (L) 14 - 54 U/L   Alkaline Phosphatase 61 38 - 126 U/L   Total Bilirubin 0.5 0.3 - 1.2 mg/dL   GFR calc non Af Amer >60 >60 mL/min   GFR calc Af Amer >60 >60 mL/min    Comment: (NOTE) The eGFR has been calculated using the CKD EPI equation. This calculation has not been validated in all clinical situations. eGFR's persistently <60 mL/min signify possible Chronic Kidney Disease.    Anion gap 9 5 - 15  CBC     Status: Abnormal   Collection Time: 05/11/17  8:24 AM  Result Value Ref Range   WBC 8.6 4.0 - 10.5 K/uL   RBC 6.03 (H) 3.87 - 5.11 MIL/uL   Hemoglobin 13.4 12.0 - 15.0 g/dL   HCT 40.5 36.0 - 46.0 %   MCV 67.2 (L) 78.0 - 100.0 fL   MCH 22.2 (L) 26.0 - 34.0 pg   MCHC 33.1 30.0 - 36.0 g/dL   RDW 14.9 11.5 - 15.5 %   Platelets 245 150 - 400 K/uL  Urinalysis, Routine w reflex microscopic  Status: Abnormal   Collection Time: 05/11/17 10:13 AM  Result Value Ref Range   Color, Urine YELLOW YELLOW   APPearance HAZY (A) CLEAR   Specific Gravity, Urine >1.030 (H) 1.005 - 1.030   pH 6.0 5.0 - 8.0   Glucose, UA NEGATIVE NEGATIVE mg/dL   Hgb urine dipstick TRACE (A) NEGATIVE   Bilirubin Urine NEGATIVE NEGATIVE   Ketones, ur NEGATIVE NEGATIVE mg/dL   Protein, ur NEGATIVE NEGATIVE mg/dL   Nitrite NEGATIVE NEGATIVE   Leukocytes, UA NEGATIVE NEGATIVE  Urinalysis, Microscopic (reflex)     Status: Abnormal   Collection Time: 05/11/17 10:13 AM  Result Value Ref Range   RBC / HPF 0-5 0 - 5 RBC/hpf   WBC, UA 0-5 0 - 5 WBC/hpf   Bacteria, UA RARE (A) NONE SEEN   Squamous Epithelial / LPF 0-5 (A) NONE SEEN   Mucus PRESENT     Ct Abdomen Pelvis W Contrast  Result Date: 05/11/2017 CLINICAL DATA:  46 year old with constipation and left lower quadrant pain. History of Crohn's disease. EXAM: CT ABDOMEN AND PELVIS WITH CONTRAST TECHNIQUE: Multidetector CT imaging of the abdomen and pelvis was performed using the standard protocol following  bolus administration of intravenous contrast. CONTRAST:  186m ISOVUE-300 IOPAMIDOL (ISOVUE-300) INJECTION 61% COMPARISON:  10/19/2016 FINDINGS: Lower chest: Lung bases are clear. Hepatobiliary: Decreased attenuation of the liver is compatible with steatosis. No focal liver lesion. No significant biliary dilatation. Portal venous system appears to be patent. Normal appearance of the gallbladder. Pancreas: Normal appearance of the pancreas without inflammation or duct dilatation. Spleen: Normal appearance of spleen without enlargement. Adrenals/Urinary Tract: Normal adrenal glands. No suspicious renal lesions and no hydronephrosis. Stomach/Bowel: Normal appearance of stomach and duodenum. Normal appearance of small bowel without dilatation. There is a extensive diverticulosis involving the sigmoid colon with wall thickening along the posterior wall of the sigmoid colon on sequence 3, image 69. Adjacent to this wall thickening, there is a small air-fluid collection compatible with a pericolonic abscess. This abscess measures 2.5 x 2.2 x 2.6 cm. Mild stranding along the left side of the pelvis adjacent to this small abscess. This abscess is surrounded by the anterior aspect of the uterus, posterior dome of the bladder and the posterior wall of the sigmoid colon. There is no definite gas within the uterus or urinary bladder. There is oral contrast within the sigmoid colon but there is no contrast within this small abscess cavity. Normal appearance of the appendix. Vascular/Lymphatic: No significant atherosclerotic disease in the abdomen or pelvis. Again noted is a prominent lymph node in the right posterior mesentery on sequence 3, image 49 measuring 1.1 cm in short axis and stable. Overall, there is not significant lymph node enlargement the abdomen and pelvis. Few prominent left periaortic lymph nodes. Reproductive: Small air-fluid collection just anterior to the uterus is compatible with an abscess. There is no  definite gas tracking into the uterus itself. Left ovary is slightly prominent but stable. Normal appearance of the right adnexa. Other: No significant free fluid.  No free intraperitoneal air. Musculoskeletal: No acute abnormality. IMPRESSION: Sigmoid colon diverticulitis with a pericolonic abscess. The abscess is located within the central aspect of the pelvis and adjacent to the anterior wall of the uterus, posterior bladder dome and the posterior wall of the sigmoid colon. Abscess measures up to 2.6 cm. This small abscess is not amendable to percutaneous drainage. Hepatic steatosis. Electronically Signed   By: AMarkus DaftM.D.   On: 05/11/2017 17:42  ROS Blood pressure 138/88, pulse 71, temperature 98.2 F (36.8 C), temperature source Oral, resp. rate 16, last menstrual period 08/20/2011, SpO2 99 %. Physical Exam  Constitutional: She is oriented to person, place, and time. She appears well-developed and well-nourished.  HENT:  Head: Normocephalic and atraumatic.  Eyes: Pupils are equal, round, and reactive to light.  Neck: Normal range of motion. Neck supple.  Cardiovascular: Normal rate and normal heart sounds.   Respiratory: Effort normal and breath sounds normal.  GI: There is tenderness.  Tender in the lower abdomen. No rebound or guarding.  Musculoskeletal: Normal range of motion.  Neurological: She is alert and oriented to person, place, and time.  Skin: Skin is warm and dry.  Psychiatric: She has a normal mood and affect. Her behavior is normal. Thought content normal.    Assessment/Plan: Acute diverticulitis with microperforation  She has had previous episodes before. She states was scheduled for surgery last year for insurance.  History of Crohn's disease. Unclear treatment and/or extent. Patient states she has never had a colonoscopy.  Treatment medically for now antibiotics and a clear liquid diet. I would not advance her diet the on that. Repeat CT scan in 3-5  days.  Tyshawn Keel A. 05/11/2017, 7:20 PM

## 2017-05-11 NOTE — ED Notes (Signed)
Notified CT pt will need oral contrast per [place], [place].

## 2017-05-11 NOTE — ED Notes (Signed)
Admitting at bedside 

## 2017-05-11 NOTE — Progress Notes (Signed)
ANTIBIOTIC CONSULT NOTE - INITIAL  Pharmacy Consult for Zosyn Indication: diveritulitis  No Known Allergies  Patient Measurements:   Adjusted Body Weight:    Vital Signs: Temp: 98.2 F (36.8 C) (09/19 0958) Temp Source: Oral (09/19 0958) BP: 148/98 (09/19 2000) Pulse Rate: 71 (09/19 2000) Intake/Output from previous day: No intake/output data recorded. Intake/Output from this shift: Total I/O In: 50 [IV Piggyback:50] Out: -   Labs:  Recent Labs  05/11/17 0824  WBC 8.6  HGB 13.4  PLT 245  CREATININE 0.63   CrCl cannot be calculated (Unknown ideal weight.). No results for input(s): VANCOTROUGH, VANCOPEAK, VANCORANDOM, GENTTROUGH, GENTPEAK, GENTRANDOM, TOBRATROUGH, TOBRAPEAK, TOBRARND, AMIKACINPEAK, AMIKACINTROU, AMIKACIN in the last 72 hours.   Microbiology: No results found for this or any previous visit (from the past 720 hour(s)).  Medical History: Past Medical History:  Diagnosis Date  . Anemia Dx: 2001 or so   . Blood transfusion   . Crohn disease ([place])   . Diverticulitis   . Hypertension   . Sickle cell trait ([facility])     Assessment: CC/HPI: Constipation, LLQ abdominal pain, and HTN found to acute complicated diverticulitis with abscess.  PMH: Crohn's dz, h/o diverticulitis, anemia, HTN, sickle cell trait, Cdiff 2013,   ID: cute complicated diverticulitis with abscess. Afebrile. WBC 8.6.Scr 0.63 WNL  Goal of Therapy:  Eradication of infection  Plan:  Zosyn 3.375g IV q 8hr. Pharmacy will sign off. Please reconsult for further dosing assitance.   Ricky Doan S. Alford Highland, PharmD, BCPS Clinical Staff Pharmacist Pager [place], [place] 05/11/2017,9:07 PM

## 2017-05-12 ENCOUNTER — Encounter (HOSPITAL_COMMUNITY): Payer: Self-pay | Admitting: General Practice

## 2017-05-12 DIAGNOSIS — I10 Essential (primary) hypertension: Secondary | ICD-10-CM

## 2017-05-12 DIAGNOSIS — K5732 Diverticulitis of large intestine without perforation or abscess without bleeding: Secondary | ICD-10-CM

## 2017-05-12 DIAGNOSIS — F17200 Nicotine dependence, unspecified, uncomplicated: Secondary | ICD-10-CM

## 2017-05-12 DIAGNOSIS — K210 Gastro-esophageal reflux disease with esophagitis: Secondary | ICD-10-CM

## 2017-05-12 DIAGNOSIS — F1210 Cannabis abuse, uncomplicated: Secondary | ICD-10-CM

## 2017-05-12 LAB — BASIC METABOLIC PANEL
ANION GAP: 8 (ref 5–15)
BUN: 5 mg/dL — ABNORMAL LOW (ref 6–20)
CALCIUM: 8.8 mg/dL — AB (ref 8.9–10.3)
CO2: 25 mmol/L (ref 22–32)
CREATININE: 0.74 mg/dL (ref 0.44–1.00)
Chloride: 103 mmol/L (ref 101–111)
GFR calc non Af Amer: >60 mL/min (ref >60)
Glucose, Bld: 94 mg/dL (ref 65–99)
Potassium: 3.3 mmol/L — ABNORMAL LOW (ref 3.5–5.1)
SODIUM: 136 mmol/L (ref 135–145)

## 2017-05-12 LAB — CBC
HCT: 37.1 % (ref 36.0–46.0)
Hemoglobin: 12.3 g/dL (ref 12.0–15.0)
MCH: 22.2 pg — ABNORMAL LOW (ref 26.0–34.0)
MCHC: 33.2 g/dL (ref 30.0–36.0)
MCV: 67.1 fL — ABNORMAL LOW (ref 78.0–100.0)
PLATELETS: 220 10*3/uL (ref 150–400)
RBC: 5.53 MIL/uL — AB (ref 3.87–5.11)
RDW: 14.7 % (ref 11.5–15.5)
WBC: 8.1 10*3/uL (ref 4.0–10.5)

## 2017-05-12 LAB — HIV ANTIBODY (ROUTINE TESTING W REFLEX): HIV Screen 4th Generation wRfx: NONREACTIVE

## 2017-05-12 MED ORDER — MORPHINE SULFATE (PF) 4 MG/ML IV SOLN
4.0000 mg | INTRAVENOUS | Status: DC | PRN
  Administered 2017-05-12 (×4): 4 mg via INTRAVENOUS
  Filled 2017-05-12 (×4): qty 1

## 2017-05-12 MED ORDER — OXYCODONE-ACETAMINOPHEN 5-325 MG PO TABS
1.0000 | ORAL_TABLET | ORAL | Status: AC | PRN
  Administered 2017-05-12 (×2): 2 via ORAL
  Filled 2017-05-12 (×2): qty 2

## 2017-05-12 MED ORDER — OXYCODONE-ACETAMINOPHEN 5-325 MG PO TABS
1.0000 | ORAL_TABLET | ORAL | Status: DC | PRN
  Administered 2017-05-12: 2 via ORAL
  Administered 2017-05-12: 1 via ORAL
  Administered 2017-05-13 (×2): 2 via ORAL
  Filled 2017-05-12 (×4): qty 2

## 2017-05-12 MED ORDER — PANTOPRAZOLE SODIUM 40 MG PO TBEC
40.0000 mg | DELAYED_RELEASE_TABLET | Freq: Every day | ORAL | Status: DC
  Administered 2017-05-13: 40 mg via ORAL
  Filled 2017-05-12: qty 1

## 2017-05-12 NOTE — Progress Notes (Signed)
[facility] Progress Note     Subjective: CC:  Reports roughly 2 episodes of diverticulitis each year, all of which have resolved with IV antibiotics. Denies previous history of diverticular abscess. Today reports minimal pain at rest but LLQ pain with bowel movements and urination. Denies dysuria or hematuria. Denies blood in stools. Last BM yesterday. Never had a colonoscopy.  Objective: Vital signs in last 24 hours: Temp:  [98.2 F (36.8 C)-99 F (37.2 C)] 99 F (37.2 C) (09/20 0559) Pulse Rate:  [71-90] 82 (09/20 0559) Resp:  [16-19] 18 (09/20 0559) BP: (113-154)/(79-108) 113/87 (09/20 0559) SpO2:  [95 %-100 %] 95 % (09/20 0559) Weight:  [86.2 kg (190 lb 1.6 oz)] 86.2 kg (190 lb 1.6 oz) (09/19 2100) Last BM Date: 05/11/17  Intake/Output from previous day: 09/19 0701 - 09/20 0700 In: 552.5 [I.V.:502.5; IV Piggyback:50] Out: -  Intake/Output this shift: No intake/output data recorded.  PE: Gen:  Alert, NAD, pleasant and cooperative Card:  Regular rate and rhythm, pedal pulses 2+ BL Pulm:  Normal effort, clear to auscultation bilaterally Abd: Soft, mild TTP LLQ without rebound tenderness, palpation of RLQ elicits central, lower abdominal pain, +BS Skin: warm and dry, no rashes  Psych: A&Ox3   Lab Results:   Recent Labs  05/11/17 0824 05/12/17 0423  WBC 8.6 8.1  HGB 13.4 12.3  HCT 40.5 37.1  PLT 245 220   BMET  Recent Labs  05/11/17 0824 05/12/17 0423  NA 137 136  K 3.5 3.3*  CL 107 103  CO2 21* 25  GLUCOSE 116* 94  BUN 6 5*  CREATININE 0.63 0.74  CALCIUM 9.1 8.8*   PT/INR No results for input(s): LABPROT, INR in the last 72 hours. CMP     Component Value Date/Time   NA 136 05/12/2017 0423   K 3.3 (L) 05/12/2017 0423   CL 103 05/12/2017 0423   CO2 25 05/12/2017 0423   GLUCOSE 94 05/12/2017 0423   BUN 5 (L) 05/12/2017 0423   CREATININE 0.74 05/12/2017 0423   CALCIUM 8.8 (L) 05/12/2017 0423   PROT 7.1 05/11/2017 0824   ALBUMIN 3.8  05/11/2017 0824   AST 16 05/11/2017 0824   ALT 12 (L) 05/11/2017 0824   ALKPHOS 61 05/11/2017 0824   BILITOT 0.5 05/11/2017 0824   GFRNONAA >60 05/12/2017 0423   GFRAA >60 05/12/2017 0423   Lipase     Component Value Date/Time   LIPASE 26 05/11/2017 0824       Studies/Results: Ct Abdomen Pelvis W Contrast  Result Date: 05/11/2017 CLINICAL DATA:  46 year old with constipation and left lower quadrant pain. History of Crohn's disease. EXAM: CT ABDOMEN AND PELVIS WITH CONTRAST TECHNIQUE: Multidetector CT imaging of the abdomen and pelvis was performed using the standard protocol following bolus administration of intravenous contrast. CONTRAST:  126m ISOVUE-300 IOPAMIDOL (ISOVUE-300) INJECTION 61% COMPARISON:  10/19/2016 FINDINGS: Lower chest: Lung bases are clear. Hepatobiliary: Decreased attenuation of the liver is compatible with steatosis. No focal liver lesion. No significant biliary dilatation. Portal venous system appears to be patent. Normal appearance of the gallbladder. Pancreas: Normal appearance of the pancreas without inflammation or duct dilatation. Spleen: Normal appearance of spleen without enlargement. Adrenals/Urinary Tract: Normal adrenal glands. No suspicious renal lesions and no hydronephrosis. Stomach/Bowel: Normal appearance of stomach and duodenum. Normal appearance of small bowel without dilatation. There is a extensive diverticulosis involving the sigmoid colon with wall thickening along the posterior wall of the sigmoid colon on sequence 3, image 69. Adjacent to  this wall thickening, there is a small air-fluid collection compatible with a pericolonic abscess. This abscess measures 2.5 x 2.2 x 2.6 cm. Mild stranding along the left side of the pelvis adjacent to this small abscess. This abscess is surrounded by the anterior aspect of the uterus, posterior dome of the bladder and the posterior wall of the sigmoid colon. There is no definite gas within the uterus or urinary  bladder. There is oral contrast within the sigmoid colon but there is no contrast within this small abscess cavity. Normal appearance of the appendix. Vascular/Lymphatic: No significant atherosclerotic disease in the abdomen or pelvis. Again noted is a prominent lymph node in the right posterior mesentery on sequence 3, image 49 measuring 1.1 cm in short axis and stable. Overall, there is not significant lymph node enlargement the abdomen and pelvis. Few prominent left periaortic lymph nodes. Reproductive: Small air-fluid collection just anterior to the uterus is compatible with an abscess. There is no definite gas tracking into the uterus itself. Left ovary is slightly prominent but stable. Normal appearance of the right adnexa. Other: No significant free fluid.  No free intraperitoneal air. Musculoskeletal: No acute abnormality. IMPRESSION: Sigmoid colon diverticulitis with a pericolonic abscess. The abscess is located within the central aspect of the pelvis and adjacent to the anterior wall of the uterus, posterior bladder dome and the posterior wall of the sigmoid colon. Abscess measures up to 2.6 cm. This small abscess is not amendable to percutaneous drainage. Hepatic steatosis. Electronically Signed   By: Markus Daft M.D.   On: 05/11/2017 17:42    Anti-infectives: Anti-infectives    Start     Dose/Rate Route Frequency Ordered Stop   05/12/17 0330  piperacillin-tazobactam (ZOSYN) IVPB 3.375 g     3.375 g 12.5 mL/hr over 240 Minutes Intravenous Every 8 hours 05/11/17 2107     05/11/17 1815  piperacillin-tazobactam (ZOSYN) IVPB 3.375 g     3.375 g 100 mL/hr over 30 Minutes Intravenous  Once 05/11/17 1808 05/11/17 2006       Assessment/Plan HTN Obesity Sickle cell trait  Acute sigmoid diverticulitis with pericolonic abscess - CT Abd/Pelv performed 9/19 w/ 2.5 x 2.2 x 2.6 cm abscess - afebrile, no leukocytosis  - continue non-operative treatment with IV abx  - continue clear liquid diet  today - mobilize, OOB  - given recurrent nature of diverticulitis and multiple hospitalizations, recommend colonoscopy 4-6 weeks after discharge followed by outpatient surgical consult to discuss elective sigmoid colectomy.   FEN: clear liquid diet ID: Zosyn 9/19 >>  VTE: SCD's, ok for chemical VTE proph from surgical perspective.  Foley: none    LOS: 1 day    [facility] 05/12/2017, 8:06 AM Pager: [phone] Consults: [phone] Mon-Fri 7:00 am-4:30 pm Sat-Sun 7:00 am-11:30 am

## 2017-05-12 NOTE — Progress Notes (Addendum)
PROGRESS NOTE  Amanda Garner  QIW:[id number] DOB: 1971-02-13 DOA: 05/11/2017 PCP: No Pcp, Per Patient (Inactive)  Brief Narrative: Amanda Garner is a 46 y.o. female with medical history significant of HTN, colitis/diverticuluitis; obesity; and sickle cell trait presenting with abdominal pain similar to previous diverticulitis flares, associated with subjective fever. CT showed acute complicated diverticulitis with abscess. IV antibiotics and IVF's were given, general surgery consulted. She's stable, advancing diet slowly.  Assessment & Plan: Principal Problem:   Sigmoid diverticulitis Active Problems:   Tobacco dependence   Marijuana abuse   Essential hypertension   Reflux esophagitis  Acute sigmoid diverticulitis with microperforation and small abscess - No surgery currently planned.  - Repeat CT scan in 3 - 5 days.  - Advance diet: clears > regular, IV antibiotics.  - Would benefit from outpatient GI evaluation for colonoscopy. Definitely no colonoscopy at this time.   GERD: Chronic, stable.  - Continue acid suppression therapy here.  - Would benefit from EGD as outpatient as well.   HTN: Chronic, stable - Prn hydralazine  Tobacco dependence: Chronic, stable, precontemplative.  - Brief cessation counseling provided - Patch ordered  Marijuana use: Chronic.  - Cessation counseling provided  DVT prophylaxis: SCDs Code Status: Full Family Communication: None at bedside Disposition Plan: DC home in 1 - 2 days.   Consultants:   General surgery  Procedures:   None  Antimicrobials:  Zosyn  Subjective: Abdominal pain stable. Tolerating liquids, had nonbloody BM.   Objective: Vitals:   05/11/17 2100 05/12/17 0559 05/12/17 1300 05/12/17 1447  BP: (!) 154/88 113/87 (!) 144/93 (!) 138/48  Pulse: 77 82  72  Resp: 19 18 18 18   Temp: 98.6 F (37 C) 99 F (37.2 C) (!) 97.3 F (36.3 C) 98.1 F (36.7 C)  TempSrc: Oral  Oral Oral  SpO2: 99% 95% 100% 100%    Weight: 86.2 kg (190 lb 1.6 oz)     Height: 5' 6"  (1.676 m)       Intake/Output Summary (Last 24 hours) at 05/12/17 1515 Last data filed at 05/12/17 1447  Gross per 24 hour  Intake            912.5 ml  Output                0 ml  Net            912.5 ml   Filed Weights   05/11/17 2100  Weight: 86.2 kg (190 lb 1.6 oz)    Gen: 46 y.o. female in no distress. Pulm: Non-labored breathing room air. Clear to auscultation bilaterally.  CV: Regular rate and rhythm. No murmur, rub, or gallop. No JVD, no pedal edema. GI: Abdomen soft, mildly tender in LLQ without rebound or guarding, non-distended, with normoactive bowel sounds. No organomegaly or masses felt. Ext: Warm, no deformities Skin: No rashes, lesions no ulcers Neuro: Alert and oriented. No focal neurological deficits. Psych: Judgement and insight appear normal. Mood & affect appropriate.   Data Reviewed: I have personally reviewed following labs and imaging studies  CBC:  Recent Labs Lab 05/11/17 0824 05/12/17 0423  WBC 8.6 8.1  HGB 13.4 12.3  HCT 40.5 37.1  MCV 67.2* 67.1*  PLT 245 417   Basic Metabolic Panel:  Recent Labs Lab 05/11/17 0824 05/12/17 0423  NA 137 136  K 3.5 3.3*  CL 107 103  CO2 21* 25  GLUCOSE 116* 94  BUN 6 5*  CREATININE 0.63 0.74  CALCIUM 9.1 8.8*  GFR: Estimated Creatinine Clearance: 96.2 mL/min (by C-G formula based on SCr of 0.74 mg/dL). Liver Function Tests:  Recent Labs Lab 05/11/17 0824  AST 16  ALT 12*  ALKPHOS 61  BILITOT 0.5  PROT 7.1  ALBUMIN 3.8    Recent Labs Lab 05/11/17 0824  LIPASE 26   No results for input(s): AMMONIA in the last 168 hours. Coagulation Profile: No results for input(s): INR, PROTIME in the last 168 hours. Cardiac Enzymes: No results for input(s): CKTOTAL, CKMB, CKMBINDEX, TROPONINI in the last 168 hours. BNP (last 3 results) No results for input(s): PROBNP in the last 8760 hours. HbA1C: No results for input(s): HGBA1C in the last  72 hours. CBG: No results for input(s): GLUCAP in the last 168 hours. Lipid Profile: No results for input(s): CHOL, HDL, LDLCALC, TRIG, CHOLHDL, LDLDIRECT in the last 72 hours. Thyroid Function Tests: No results for input(s): TSH, T4TOTAL, FREET4, T3FREE, THYROIDAB in the last 72 hours. Anemia Panel: No results for input(s): VITAMINB12, FOLATE, FERRITIN, TIBC, IRON, RETICCTPCT in the last 72 hours. Urine analysis:    Component Value Date/Time   COLORURINE YELLOW 05/11/2017 1013   APPEARANCEUR HAZY (A) 05/11/2017 1013   LABSPEC >1.030 (H) 05/11/2017 1013   PHURINE 6.0 05/11/2017 1013   GLUCOSEU NEGATIVE 05/11/2017 1013   HGBUR TRACE (A) 05/11/2017 1013   BILIRUBINUR NEGATIVE 05/11/2017 1013   KETONESUR NEGATIVE 05/11/2017 1013   PROTEINUR NEGATIVE 05/11/2017 1013   UROBILINOGEN 1.0 09/07/2012 0019   NITRITE NEGATIVE 05/11/2017 1013   LEUKOCYTESUR NEGATIVE 05/11/2017 1013   No results found for this or any previous visit (from the past 240 hour(s)).    Radiology Studies: Ct Abdomen Pelvis W Contrast  Result Date: 05/11/2017 CLINICAL DATA:  46 year old with constipation and left lower quadrant pain. History of Crohn's disease. EXAM: CT ABDOMEN AND PELVIS WITH CONTRAST TECHNIQUE: Multidetector CT imaging of the abdomen and pelvis was performed using the standard protocol following bolus administration of intravenous contrast. CONTRAST:  110m ISOVUE-300 IOPAMIDOL (ISOVUE-300) INJECTION 61% COMPARISON:  10/19/2016 FINDINGS: Lower chest: Lung bases are clear. Hepatobiliary: Decreased attenuation of the liver is compatible with steatosis. No focal liver lesion. No significant biliary dilatation. Portal venous system appears to be patent. Normal appearance of the gallbladder. Pancreas: Normal appearance of the pancreas without inflammation or duct dilatation. Spleen: Normal appearance of spleen without enlargement. Adrenals/Urinary Tract: Normal adrenal glands. No suspicious renal lesions and  no hydronephrosis. Stomach/Bowel: Normal appearance of stomach and duodenum. Normal appearance of small bowel without dilatation. There is a extensive diverticulosis involving the sigmoid colon with wall thickening along the posterior wall of the sigmoid colon on sequence 3, image 69. Adjacent to this wall thickening, there is a small air-fluid collection compatible with a pericolonic abscess. This abscess measures 2.5 x 2.2 x 2.6 cm. Mild stranding along the left side of the pelvis adjacent to this small abscess. This abscess is surrounded by the anterior aspect of the uterus, posterior dome of the bladder and the posterior wall of the sigmoid colon. There is no definite gas within the uterus or urinary bladder. There is oral contrast within the sigmoid colon but there is no contrast within this small abscess cavity. Normal appearance of the appendix. Vascular/Lymphatic: No significant atherosclerotic disease in the abdomen or pelvis. Again noted is a prominent lymph node in the right posterior mesentery on sequence 3, image 49 measuring 1.1 cm in short axis and stable. Overall, there is not significant lymph node enlargement the abdomen and pelvis. Few  prominent left periaortic lymph nodes. Reproductive: Small air-fluid collection just anterior to the uterus is compatible with an abscess. There is no definite gas tracking into the uterus itself. Left ovary is slightly prominent but stable. Normal appearance of the right adnexa. Other: No significant free fluid.  No free intraperitoneal air. Musculoskeletal: No acute abnormality. IMPRESSION: Sigmoid colon diverticulitis with a pericolonic abscess. The abscess is located within the central aspect of the pelvis and adjacent to the anterior wall of the uterus, posterior bladder dome and the posterior wall of the sigmoid colon. Abscess measures up to 2.6 cm. This small abscess is not amendable to percutaneous drainage. Hepatic steatosis. Electronically Signed   By:  Markus Daft M.D.   On: 05/11/2017 17:42    Scheduled Meds: . nicotine  7 mg Transdermal Daily  . pantoprazole (PROTONIX) IV  40 mg Intravenous Q12H   Continuous Infusions: . lactated ringers 75 mL/hr at 05/11/17 2118  . piperacillin-tazobactam (ZOSYN)  IV 3.375 g (05/12/17 1232)     LOS: 1 day   Time spent: 25 minutes.  Vance Gather, MD [facility] Pager [phone]  If 7PM-7AM, please contact night-coverage [email or web address] Password [email or web address] 05/12/2017, 3:15 PM

## 2017-05-13 ENCOUNTER — Encounter: Payer: Self-pay | Admitting: Physician Assistant

## 2017-05-13 ENCOUNTER — Encounter (HOSPITAL_COMMUNITY): Payer: Self-pay | Admitting: *Deleted

## 2017-05-13 ENCOUNTER — Telehealth: Payer: Self-pay

## 2017-05-13 LAB — CBC
HCT: 34.3 % — ABNORMAL LOW (ref 36.0–46.0)
Hemoglobin: 11.4 g/dL — ABNORMAL LOW (ref 12.0–15.0)
MCH: 22.2 pg — AB (ref 26.0–34.0)
MCHC: 33.2 g/dL (ref 30.0–36.0)
MCV: 66.7 fL — AB (ref 78.0–100.0)
PLATELETS: 200 10*3/uL (ref 150–400)
RBC: 5.14 MIL/uL — AB (ref 3.87–5.11)
RDW: 14.8 % (ref 11.5–15.5)
WBC: 8.4 10*3/uL (ref 4.0–10.5)

## 2017-05-13 LAB — BASIC METABOLIC PANEL
Anion gap: 5 (ref 5–15)
BUN: 5 mg/dL — AB (ref 6–20)
CHLORIDE: 104 mmol/L (ref 101–111)
CO2: 26 mmol/L (ref 22–32)
CREATININE: 0.73 mg/dL (ref 0.44–1.00)
Calcium: 8.5 mg/dL — ABNORMAL LOW (ref 8.9–10.3)
GFR calc Af Amer: >60 mL/min (ref >60)
GFR calc non Af Amer: >60 mL/min (ref >60)
GLUCOSE: 87 mg/dL (ref 65–99)
POTASSIUM: 3.4 mmol/L — AB (ref 3.5–5.1)
Sodium: 135 mmol/L (ref 135–145)

## 2017-05-13 MED ORDER — OXYCODONE-ACETAMINOPHEN 5-325 MG PO TABS: 1.0000 | ORAL_TABLET | ORAL | 0 refills | Status: DC | PRN

## 2017-05-13 MED ORDER — AMOXICILLIN-POT CLAVULANATE 875-125 MG PO TABS: 1.0000 | ORAL_TABLET | Freq: Two times a day (BID) | ORAL | 0 refills | Status: DC

## 2017-05-13 MED ORDER — DIPHENHYDRAMINE HCL 25 MG PO CAPS
25.0000 mg | ORAL_CAPSULE | Freq: Once | ORAL | Status: AC
  Administered 2017-05-13: 25 mg via ORAL
  Filled 2017-05-13: qty 1

## 2017-05-13 NOTE — Discharge Summary (Signed)
Physician Discharge Summary  Amanda Garner:[id number] DOB: 1971-02-20 DOA: 05/11/2017  PCP: No Pcp, Per Patient (Inactive)  Admit date: 05/11/2017 Discharge date: 05/13/2017  Admitted From: Home Disposition: Home   Recommendations for Outpatient Follow-up:  1. Follow up with GI 10/2 as scheduled to discuss colonoscopy in about 4 weeks.  2. Follow up with general surgery after this to discuss elective colectomy.  Home Health: None Equipment/Devices: None Discharge Condition: Stable CODE STATUS: Full Diet recommendation: As tolerated  Brief/Interim Summary: Amanda Garner a 46 y.o.femalewith medical history significant of HTN, colitis/diverticuluitis; obesity; and sickle cell trait presenting with abdominal pain similar to previous diverticulitis flares, associated with subjective fever. CT showed acute complicated diverticulitis with abscess. IV antibiotics and IVF's were given, general surgery consulted with improvement. She was able to tolerate an advancing diet, remains afebrile without evidence of SIRS and will be discharged with follow up scheduled with GI for follow up colonoscopy.   Discharge Diagnoses:  Principal Problem:   Sigmoid diverticulitis Active Problems:   Tobacco dependence   Marijuana abuse   Essential hypertension   Reflux esophagitis  Acute sigmoid diverticulitis with microperforation and small abscess: Clinically stable, tolerating diet without SIRS.  - Scheduled GI follow up to discuss colonoscopy in 4 - 6 weeks, to follow up with general surgery after this to discuss elective sigmoid colectomy.  - Augmentin x10 days - Continue diet as tolerated per general surgery.   GERD: Chronic, stable.  - Continue acid suppression therapy here.  - May benefit from EGD as outpatient as well.   HTN: Chronic, stable - Mildly elevated, though no medications on MAR. Will not start these at this time due to poor follow up and possible contribution from  pain.   Tobacco dependence: Chronic, stable, precontemplative.  - Brief cessation counseling provided  Marijuana use: Chronic.  - Cessation counseling provided  Discharge Instructions Discharge Instructions    Discharge instructions    Complete by:  As directed    Take augmentin twice daily for 10 days to treat the infection  Follow up with GI as scheduled below. If you are unable to make that appointment, PLEASE call their office to reschedule.  Also follow up with general surgery after you speak with GI.  If your symptoms WORSEN, seek medical attention right away.     Allergies as of 05/13/2017   No Known Allergies     Medication List    TAKE these medications   amoxicillin-clavulanate 875-125 MG tablet Commonly known as:  AUGMENTIN Take 1 tablet by mouth 2 (two) times daily.   oxyCODONE-acetaminophen 5-325 MG tablet Commonly known as:  PERCOCET/ROXICET Take 1 tablet by mouth every 4 (four) hours as needed for moderate pain or severe pain.            Discharge Care Instructions        Start     Ordered   05/13/17 0000  oxyCODONE-acetaminophen (PERCOCET/ROXICET) 5-325 MG tablet  Every 4 hours PRN     05/13/17 1329   05/13/17 0000  amoxicillin-clavulanate (AUGMENTIN) 875-125 MG tablet  2 times daily     05/13/17 1329   05/13/17 0000  Discharge instructions    Comments:  Take augmentin twice daily for 10 days to treat the infection  Follow up with GI as scheduled below. If you are unable to make that appointment, PLEASE call their office to reschedule.  Also follow up with general surgery after you speak with GI.  If your symptoms WORSEN,  seek medical attention right away.   05/13/17 1329     Follow-up Information    Cornett, Marcello Moores, MD. Schedule an appointment as soon as possible for a visit in 4 week(s).   Specialty:  General Surgery Why:  for follow up from recent hospitalization to discuss elective colon surgery for recurrent diverticulitis. Contact  information: [address] [place] [address] [phone]        Alfredia Ferguson, PA-C Follow up on 05/24/2017.   Specialty:  Gastroenterology Why:  9:30am to discuss colonoscopy Contact information: [address] [place] [address] [phone]          No Known Allergies  Consultations:  General surgery  Procedures/Studies: Ct Abdomen Pelvis W Contrast  Result Date: 05/11/2017 CLINICAL DATA:  46 year old with constipation and left lower quadrant pain. History of Crohn's disease. EXAM: CT ABDOMEN AND PELVIS WITH CONTRAST TECHNIQUE: Multidetector CT imaging of the abdomen and pelvis was performed using the standard protocol following bolus administration of intravenous contrast. CONTRAST:  143m ISOVUE-300 IOPAMIDOL (ISOVUE-300) INJECTION 61% COMPARISON:  10/19/2016 FINDINGS: Lower chest: Lung bases are clear. Hepatobiliary: Decreased attenuation of the liver is compatible with steatosis. No focal liver lesion. No significant biliary dilatation. Portal venous system appears to be patent. Normal appearance of the gallbladder. Pancreas: Normal appearance of the pancreas without inflammation or duct dilatation. Spleen: Normal appearance of spleen without enlargement. Adrenals/Urinary Tract: Normal adrenal glands. No suspicious renal lesions and no hydronephrosis. Stomach/Bowel: Normal appearance of stomach and duodenum. Normal appearance of small bowel without dilatation. There is a extensive diverticulosis involving the sigmoid colon with wall thickening along the posterior wall of the sigmoid colon on sequence 3, image 69. Adjacent to this wall thickening, there is a small air-fluid collection compatible with a pericolonic abscess. This abscess measures 2.5 x 2.2 x 2.6 cm. Mild stranding along the left side of the pelvis adjacent to this small abscess. This abscess is surrounded by the anterior aspect of the uterus, posterior dome of the bladder and the posterior wall  of the sigmoid colon. There is no definite gas within the uterus or urinary bladder. There is oral contrast within the sigmoid colon but there is no contrast within this small abscess cavity. Normal appearance of the appendix. Vascular/Lymphatic: No significant atherosclerotic disease in the abdomen or pelvis. Again noted is a prominent lymph node in the right posterior mesentery on sequence 3, image 49 measuring 1.1 cm in short axis and stable. Overall, there is not significant lymph node enlargement the abdomen and pelvis. Few prominent left periaortic lymph nodes. Reproductive: Small air-fluid collection just anterior to the uterus is compatible with an abscess. There is no definite gas tracking into the uterus itself. Left ovary is slightly prominent but stable. Normal appearance of the right adnexa. Other: No significant free fluid.  No free intraperitoneal air. Musculoskeletal: No acute abnormality. IMPRESSION: Sigmoid colon diverticulitis with a pericolonic abscess. The abscess is located within the central aspect of the pelvis and adjacent to the anterior wall of the uterus, posterior bladder dome and the posterior wall of the sigmoid colon. Abscess measures up to 2.6 cm. This small abscess is not amendable to percutaneous drainage. Hepatic steatosis. Electronically Signed   By: AMarkus DaftM.D.   On: 05/11/2017 17:42   Subjective: Feels better, wants to go home. Tolerating diet, no bloody BMs, pain controlled, no fevers.   Discharge Exam: BP (!) 145/93 (BP Location: Left Arm)   Pulse 84  Temp 98.3 F (36.8 C) (Oral)   Resp 18   Ht 5' 6"  (1.676 m)   Wt 86.2 kg (190 lb 1.6 oz)   LMP 08/20/2011   SpO2 93%   BMI 30.68 kg/m   General: Pt is alert, awake, not in acute distress Cardiovascular: RRR, S1/S2 +, no rubs, no gallops Respiratory: CTA bilaterally, no wheezing, no rhonchi Abdominal: Soft, mildly TTP in LLQ without rebound or guarding.  Labs: Basic Metabolic Panel:  Recent Labs Lab  05/11/17 0824 05/12/17 0423 05/13/17 0443  NA 137 136 135  K 3.5 3.3* 3.4*  CL 107 103 104  CO2 21* 25 26  GLUCOSE 116* 94 87  BUN 6 5* 5*  CREATININE 0.63 0.74 0.73  CALCIUM 9.1 8.8* 8.5*   Liver Function Tests:  Recent Labs Lab 05/11/17 0824  AST 16  ALT 12*  ALKPHOS 61  BILITOT 0.5  PROT 7.1  ALBUMIN 3.8    Recent Labs Lab 05/11/17 0824  LIPASE 26   CBC:  Recent Labs Lab 05/11/17 0824 05/12/17 0423 05/13/17 0443  WBC 8.6 8.1 8.4  HGB 13.4 12.3 11.4*  HCT 40.5 37.1 34.3*  MCV 67.2* 67.1* 66.7*  PLT 245 220 200   Urinalysis    Component Value Date/Time   COLORURINE YELLOW 05/11/2017 1013   APPEARANCEUR HAZY (A) 05/11/2017 1013   LABSPEC >1.030 (H) 05/11/2017 1013   PHURINE 6.0 05/11/2017 1013   GLUCOSEU NEGATIVE 05/11/2017 1013   HGBUR TRACE (A) 05/11/2017 1013   BILIRUBINUR NEGATIVE 05/11/2017 1013   KETONESUR NEGATIVE 05/11/2017 1013   PROTEINUR NEGATIVE 05/11/2017 1013   UROBILINOGEN 1.0 09/07/2012 0019   NITRITE NEGATIVE 05/11/2017 1013   LEUKOCYTESUR NEGATIVE 05/11/2017 1013    Time coordinating discharge: Approximately 40 minutes  Vance Gather, MD  [facility] 05/13/2017, 1:29 PM Pager [phone]

## 2017-05-13 NOTE — Discharge Instructions (Signed)

## 2017-05-13 NOTE — Progress Notes (Signed)
[facility] Progress Note     Subjective: CC:  Pain improved. Still lower abdominal discomfort with urination/BM. Mild dysuria. No gross hematuria. Tolerating PO without worsening of abdominal pain, nausea, or vomiting. +flatus. Having bowel function but very small volume BMs. Mobilizing. Wants to go home.   Objective: Vital signs in last 24 hours: Temp:  [97.3 F (36.3 C)-99.3 F (37.4 C)] 98.2 F (36.8 C) (09/21 0710) Pulse Rate:  [72-99] 85 (09/21 0710) Resp:  [17-18] 17 (09/20 2136) BP: (132-158)/(48-97) 158/97 (09/21 0710) SpO2:  [96 %-100 %] 96 % (09/21 0710) Last BM Date: 05/11/17  Intake/Output from previous day: 09/20 0701 - 09/21 0700 In: 2580 [P.O.:580; I.V.:1800; IV Piggyback:200] Out: -  Intake/Output this shift: Total I/O In: 220 [P.O.:220] Out: -   PE: Gen:  Alert, NAD, pleasant and cooperative Card:  Regular rate and rhythm, pedal pulses 2+ BL Pulm:  Normal effort, clear to auscultation bilaterally Abd: Soft, mild TTP LLQ without rebound tenderness, palpation of RLQ elicits mild central, lower abdominal pain, +BS, no masses Skin: warm and dry, no rashes  Psych: A&Ox3    Lab Results:   Recent Labs  05/12/17 0423 05/13/17 0443  WBC 8.1 8.4  HGB 12.3 11.4*  HCT 37.1 34.3*  PLT 220 200   BMET  Recent Labs  05/12/17 0423 05/13/17 0443  NA 136 135  K 3.3* 3.4*  CL 103 104  CO2 25 26  GLUCOSE 94 87  BUN 5* 5*  CREATININE 0.74 0.73  CALCIUM 8.8* 8.5*   PT/INR No results for input(s): LABPROT, INR in the last 72 hours. CMP     Component Value Date/Time   NA 135 05/13/2017 0443   K 3.4 (L) 05/13/2017 0443   CL 104 05/13/2017 0443   CO2 26 05/13/2017 0443   GLUCOSE 87 05/13/2017 0443   BUN 5 (L) 05/13/2017 0443   CREATININE 0.73 05/13/2017 0443   CALCIUM 8.5 (L) 05/13/2017 0443   PROT 7.1 05/11/2017 0824   ALBUMIN 3.8 05/11/2017 0824   AST 16 05/11/2017 0824   ALT 12 (L) 05/11/2017 0824   ALKPHOS 61 05/11/2017 0824    BILITOT 0.5 05/11/2017 0824   GFRNONAA >60 05/13/2017 0443   GFRAA >60 05/13/2017 0443   Lipase     Component Value Date/Time   LIPASE 26 05/11/2017 0824       Studies/Results: Ct Abdomen Pelvis W Contrast  Result Date: 05/11/2017 CLINICAL DATA:  46 year old with constipation and left lower quadrant pain. History of Crohn's disease. EXAM: CT ABDOMEN AND PELVIS WITH CONTRAST TECHNIQUE: Multidetector CT imaging of the abdomen and pelvis was performed using the standard protocol following bolus administration of intravenous contrast. CONTRAST:  125m ISOVUE-300 IOPAMIDOL (ISOVUE-300) INJECTION 61% COMPARISON:  10/19/2016 FINDINGS: Lower chest: Lung bases are clear. Hepatobiliary: Decreased attenuation of the liver is compatible with steatosis. No focal liver lesion. No significant biliary dilatation. Portal venous system appears to be patent. Normal appearance of the gallbladder. Pancreas: Normal appearance of the pancreas without inflammation or duct dilatation. Spleen: Normal appearance of spleen without enlargement. Adrenals/Urinary Tract: Normal adrenal glands. No suspicious renal lesions and no hydronephrosis. Stomach/Bowel: Normal appearance of stomach and duodenum. Normal appearance of small bowel without dilatation. There is a extensive diverticulosis involving the sigmoid colon with wall thickening along the posterior wall of the sigmoid colon on sequence 3, image 69. Adjacent to this wall thickening, there is a small air-fluid collection compatible with a pericolonic abscess. This abscess measures 2.5 x 2.2  x 2.6 cm. Mild stranding along the left side of the pelvis adjacent to this small abscess. This abscess is surrounded by the anterior aspect of the uterus, posterior dome of the bladder and the posterior wall of the sigmoid colon. There is no definite gas within the uterus or urinary bladder. There is oral contrast within the sigmoid colon but there is no contrast within this small abscess  cavity. Normal appearance of the appendix. Vascular/Lymphatic: No significant atherosclerotic disease in the abdomen or pelvis. Again noted is a prominent lymph node in the right posterior mesentery on sequence 3, image 49 measuring 1.1 cm in short axis and stable. Overall, there is not significant lymph node enlargement the abdomen and pelvis. Few prominent left periaortic lymph nodes. Reproductive: Small air-fluid collection just anterior to the uterus is compatible with an abscess. There is no definite gas tracking into the uterus itself. Left ovary is slightly prominent but stable. Normal appearance of the right adnexa. Other: No significant free fluid.  No free intraperitoneal air. Musculoskeletal: No acute abnormality. IMPRESSION: Sigmoid colon diverticulitis with a pericolonic abscess. The abscess is located within the central aspect of the pelvis and adjacent to the anterior wall of the uterus, posterior bladder dome and the posterior wall of the sigmoid colon. Abscess measures up to 2.6 cm. This small abscess is not amendable to percutaneous drainage. Hepatic steatosis. Electronically Signed   By: Markus Daft M.D.   On: 05/11/2017 17:42    Anti-infectives: Anti-infectives    Start     Dose/Rate Route Frequency Ordered Stop   05/12/17 0330  piperacillin-tazobactam (ZOSYN) IVPB 3.375 g     3.375 g 12.5 mL/hr over 240 Minutes Intravenous Every 8 hours 05/11/17 2107     05/11/17 1815  piperacillin-tazobactam (ZOSYN) IVPB 3.375 g     3.375 g 100 mL/hr over 30 Minutes Intravenous  Once 05/11/17 1808 05/11/17 2006     Assessment/Plan HTN Obesity Sickle cell trait  Acute sigmoid diverticulitis with pericolonic abscess - CT Abd/Pelv performed 9/19 w/ 2.5 x 2.2 x 2.6 cm abscess - afebrile, no leukocytosis  - continue non-operative treatment with IV abx  - Regular diet  - mobilize, OOB  - given recurrent nature of diverticulitis and multiple hospitalizations, recommend colonoscopy 4-6 weeks  after discharge followed by outpatient surgical consult to discuss elective sigmoid colectomy.   FEN: clear liquid diet ID: Zosyn 9/19 >>  VTE: SCD's, ok for chemical VTE proph from surgical perspective.  Foley: none  Plan: OK to transition to PO abx, for example Augmentin x 10 days, stable for discharge from surgical perspective with above follow up.    LOS: 2 days    Jill Alexanders , [facility] 05/13/2017, 8:57 AM Pager: [phone] Consults: [phone] Mon-Fri 7:00 am-4:30 pm Sat-Sun 7:00 am-11:30 am

## 2017-05-13 NOTE — Telephone Encounter (Signed)
A user error has taken place: ERROR °

## 2017-05-24 ENCOUNTER — Telehealth: Payer: Self-pay | Admitting: *Deleted

## 2017-05-24 ENCOUNTER — Ambulatory Visit (INDEPENDENT_AMBULATORY_CARE_PROVIDER_SITE_OTHER)
Admission: RE | Admit: 2017-05-24 | Discharge: 2017-05-24 | Disposition: A | Discharge status: Home / Self Care | Payer: BLUE CROSS/BLUE SHIELD | Source: Ambulatory Visit | Attending: Physician Assistant | Admitting: Physician Assistant

## 2017-05-24 ENCOUNTER — Encounter: Payer: Self-pay | Admitting: Physician Assistant

## 2017-05-24 ENCOUNTER — Encounter (HOSPITAL_COMMUNITY): Payer: Self-pay

## 2017-05-24 ENCOUNTER — Ambulatory Visit (INDEPENDENT_AMBULATORY_CARE_PROVIDER_SITE_OTHER): Payer: BLUE CROSS/BLUE SHIELD | Admitting: Physician Assistant

## 2017-05-24 ENCOUNTER — Inpatient Hospital Stay (HOSPITAL_COMMUNITY)
Admission: AD | Admit: 2017-05-24 | Discharge: 2017-05-31 | DRG: 392 | Disposition: A | Discharge status: Home / Self Care | Payer: BLUE CROSS/BLUE SHIELD | Source: Ambulatory Visit | Attending: Internal Medicine | Admitting: Internal Medicine

## 2017-05-24 ENCOUNTER — Other Ambulatory Visit (INDEPENDENT_AMBULATORY_CARE_PROVIDER_SITE_OTHER): Payer: BLUE CROSS/BLUE SHIELD

## 2017-05-24 VITALS — BP 120/70 | HR 82 | Ht 65.5 in | Wt 209.0 lb

## 2017-05-24 DIAGNOSIS — R51 Headache: Secondary | ICD-10-CM | POA: Diagnosis not present

## 2017-05-24 DIAGNOSIS — R10814 Left lower quadrant abdominal tenderness: Secondary | ICD-10-CM

## 2017-05-24 DIAGNOSIS — D573 Sickle-cell trait: Secondary | ICD-10-CM | POA: Diagnosis present

## 2017-05-24 DIAGNOSIS — Z823 Family history of stroke: Secondary | ICD-10-CM

## 2017-05-24 DIAGNOSIS — F1210 Cannabis abuse, uncomplicated: Secondary | ICD-10-CM | POA: Diagnosis present

## 2017-05-24 DIAGNOSIS — F17200 Nicotine dependence, unspecified, uncomplicated: Secondary | ICD-10-CM | POA: Diagnosis present

## 2017-05-24 DIAGNOSIS — I10 Essential (primary) hypertension: Secondary | ICD-10-CM | POA: Diagnosis present

## 2017-05-24 DIAGNOSIS — Z833 Family history of diabetes mellitus: Secondary | ICD-10-CM | POA: Diagnosis not present

## 2017-05-24 DIAGNOSIS — F17210 Nicotine dependence, cigarettes, uncomplicated: Secondary | ICD-10-CM | POA: Diagnosis present

## 2017-05-24 DIAGNOSIS — D509 Iron deficiency anemia, unspecified: Secondary | ICD-10-CM | POA: Diagnosis present

## 2017-05-24 DIAGNOSIS — Z90710 Acquired absence of both cervix and uterus: Secondary | ICD-10-CM

## 2017-05-24 DIAGNOSIS — K5720 Diverticulitis of large intestine with perforation and abscess without bleeding: Secondary | ICD-10-CM | POA: Diagnosis present

## 2017-05-24 DIAGNOSIS — Z23 Encounter for immunization: Secondary | ICD-10-CM | POA: Diagnosis not present

## 2017-05-24 DIAGNOSIS — R1032 Left lower quadrant pain: Secondary | ICD-10-CM

## 2017-05-24 DIAGNOSIS — Z801 Family history of malignant neoplasm of trachea, bronchus and lung: Secondary | ICD-10-CM

## 2017-05-24 DIAGNOSIS — R300 Dysuria: Secondary | ICD-10-CM

## 2017-05-24 LAB — CBC WITH DIFFERENTIAL/PLATELET
BASOS ABS: 0.1 10*3/uL (ref 0.0–0.1)
BASOS PCT: 0.6 % (ref 0.0–3.0)
EOS ABS: 0.3 10*3/uL (ref 0.0–0.7)
Eosinophils Relative: 3.8 % (ref 0.0–5.0)
HEMATOCRIT: 40.8 % (ref 36.0–46.0)
HEMOGLOBIN: 13.2 g/dL (ref 12.0–15.0)
LYMPHS PCT: 32.9 % (ref 12.0–46.0)
Lymphs Abs: 2.8 10*3/uL (ref 0.7–4.0)
MCHC: 32.5 g/dL (ref 30.0–36.0)
MONOS PCT: 6.8 % (ref 3.0–12.0)
Monocytes Absolute: 0.6 10*3/uL (ref 0.1–1.0)
NEUTROS ABS: 4.7 10*3/uL (ref 1.4–7.7)
Neutrophils Relative %: 55.9 % (ref 43.0–77.0)
PLATELETS: 269 10*3/uL (ref 150.0–400.0)
RBC: 5.98 Mil/uL — ABNORMAL HIGH (ref 3.87–5.11)
RDW: 14.9 % (ref 11.5–15.5)
WBC: 8.4 10*3/uL (ref 4.0–10.5)

## 2017-05-24 LAB — BASIC METABOLIC PANEL
BUN: 10 mg/dL (ref 6–23)
CHLORIDE: 108 meq/L (ref 96–112)
CO2: 25 mEq/L (ref 19–32)
CREATININE: 0.73 mg/dL (ref 0.40–1.20)
Calcium: 9.6 mg/dL (ref 8.4–10.5)
GFR: 109.79 mL/min (ref >60.00)
GLUCOSE: 117 mg/dL — AB (ref 70–99)
POTASSIUM: 4 meq/L (ref 3.5–5.1)
Sodium: 140 mEq/L (ref 135–145)

## 2017-05-24 MED ORDER — ONDANSETRON HCL 4 MG/2ML IJ SOLN: 4.0000 mg | Freq: Four times a day (QID) | INTRAMUSCULAR | Status: DC | PRN

## 2017-05-24 MED ORDER — AMOXICILLIN-POT CLAVULANATE 875-125 MG PO TABS: ORAL_TABLET | ORAL | 0 refills | Status: DC

## 2017-05-24 MED ORDER — INFLUENZA VAC SPLIT QUAD 0.5 ML IM SUSY
0.5000 mL | PREFILLED_SYRINGE | INTRAMUSCULAR | Status: AC
  Administered 2017-05-25: 10:00:00 0.5 mL via INTRAMUSCULAR
  Filled 2017-05-24: qty 0.5

## 2017-05-24 MED ORDER — ACETAMINOPHEN 325 MG PO TABS
650.0000 mg | ORAL_TABLET | Freq: Four times a day (QID) | ORAL | Status: DC | PRN
  Administered 2017-05-25 – 2017-05-28 (×5): 650 mg via ORAL
  Filled 2017-05-24 (×6): qty 2

## 2017-05-24 MED ORDER — LACTATED RINGERS IV SOLN
INTRAVENOUS | Status: DC
  Administered 2017-05-24 – 2017-05-29 (×10): via INTRAVENOUS

## 2017-05-24 MED ORDER — ONDANSETRON HCL 4 MG PO TABS: 4.0000 mg | ORAL_TABLET | Freq: Four times a day (QID) | ORAL | Status: DC | PRN

## 2017-05-24 MED ORDER — ENOXAPARIN SODIUM 40 MG/0.4ML ~~LOC~~ SOLN
40.0000 mg | SUBCUTANEOUS | Status: DC
  Administered 2017-05-24 – 2017-05-30 (×7): 40 mg via SUBCUTANEOUS
  Filled 2017-05-24 (×7): qty 0.4

## 2017-05-24 MED ORDER — IOPAMIDOL (ISOVUE-300) INJECTION 61%
100.0000 mL | Freq: Once | INTRAVENOUS | Status: AC | PRN
  Administered 2017-05-24: 100 mL via INTRAVENOUS

## 2017-05-24 MED ORDER — PIPERACILLIN-TAZOBACTAM 3.375 G IVPB
3.3750 g | Freq: Three times a day (TID) | INTRAVENOUS | Status: DC
  Administered 2017-05-24 – 2017-05-31 (×20): 3.375 g via INTRAVENOUS
  Filled 2017-05-24 (×21): qty 50

## 2017-05-24 MED ORDER — HYDRALAZINE HCL 20 MG/ML IJ SOLN
5.0000 mg | INTRAMUSCULAR | Status: DC | PRN
  Administered 2017-05-25 – 2017-05-28 (×3): 5 mg via INTRAVENOUS
  Filled 2017-05-24 (×3): qty 1

## 2017-05-24 MED ORDER — NICOTINE 7 MG/24HR TD PT24
7.0000 mg | MEDICATED_PATCH | Freq: Every day | TRANSDERMAL | Status: DC
  Administered 2017-05-24 – 2017-05-31 (×7): 7 mg via TRANSDERMAL
  Filled 2017-05-24 (×8): qty 1

## 2017-05-24 MED ORDER — OXYCODONE-ACETAMINOPHEN 5-325 MG PO TABS: 1.0000 | ORAL_TABLET | Freq: Four times a day (QID) | ORAL | 0 refills | Status: DC | PRN

## 2017-05-24 MED ORDER — DOCUSATE SODIUM 100 MG PO CAPS
100.0000 mg | ORAL_CAPSULE | Freq: Two times a day (BID) | ORAL | Status: DC
  Administered 2017-05-25 – 2017-05-31 (×12): 100 mg via ORAL
  Filled 2017-05-24 (×12): qty 1

## 2017-05-24 MED ORDER — MORPHINE SULFATE (PF) 4 MG/ML IV SOLN
2.0000 mg | INTRAVENOUS | Status: DC | PRN
  Administered 2017-05-24 – 2017-05-25 (×3): 2 mg via INTRAVENOUS
  Filled 2017-05-24 (×3): qty 1

## 2017-05-24 MED ORDER — ACETAMINOPHEN 650 MG RE SUPP: 650.0000 mg | Freq: Four times a day (QID) | RECTAL | Status: DC | PRN

## 2017-05-24 NOTE — Progress Notes (Signed)
Nurse paged Dr. Earlie Counts to make provider aware of patient arriving to floor.

## 2017-05-24 NOTE — Progress Notes (Addendum)
Subjective:    Patient ID: Amanda Garner, female    DOB: 06/04/1971, 46 y.o.   MRN: [id number]  HPI Amanda Garner is a pleasant 46 year old African-American female, known to Dr. Hilarie Garner who was last seen in our office in 2013. She is referred today by Dr. Fortino Garner Amanda Garner after recent admission with acute diverticulitis and abscess. Patient states that she has had prior episodes of diverticulitis, the last about a year ago. She has not had previous colonoscopy. She also had an episode of C. difficile colitis in 2013, has obesity, hypertension and sickle cell trait. Patient was admitted 919 through 05/13/2017 with acute diverticulitis. She was not seen by GI, but was followed by surgery. She had CT on 05/11/2017 that showed hepatic steatosis, extensive diverticulosis with wall thickening along the posterior wall of the sigmoid colon and a small abscess measuring 2.5 x 2.2 x 2.6 cm there was also held stranding along the left side of the bladder adjacent to the abscess the abscess is surrounded by the anterior aspect of the uterus, posterior dome of the bladder and posterior wall of sigmoid. She was treated with IV antibiotics for 2 days then discharged home on Augmentin 875 mg by mouth twice a day 10 days. Patient states that her pain was probably and 8 when she was admitted to the hospital. She says her pain has not resolved and is still at a "5 or 6". Pain is still fairly constant. She had an episode of chills last night and had to get under a lot of cover she's not aware of definite fever. She has not had any nausea or vomiting. She has been eating a solid diet but admits to increased pain after eating. Bowels have been alternating between constipated stools and loose stools which is her norm. She is on her last day of Augmentin. She had a prescription for Percocet which she has used.  Review of Systems Pertinent positive and negative review of systems were noted in the above HPI section.   All other review of systems was otherwise negative.  Outpatient Encounter Prescriptions as of 05/24/2017  Medication Sig  . amoxicillin-clavulanate (AUGMENTIN) 875-125 MG tablet Take 1 tablet by mouth 2 (two) times daily.  . [DISCONTINUED] oxyCODONE-acetaminophen (PERCOCET/ROXICET) 5-325 MG tablet Take 1 tablet by mouth every 4 (four) hours as needed for moderate pain or severe pain.   No facility-administered encounter medications on file as of 05/24/2017.    No Known Allergies Patient Active Problem List   Diagnosis Date Noted  . Sigmoid diverticulitis 05/11/2017  . Tobacco dependence 05/11/2017  . Marijuana abuse 05/11/2017  . Essential hypertension 05/11/2017  . Reflux esophagitis 05/11/2017  . Tinea pedis of left foot 04/11/2015  . Blister of foot with infection 04/11/2015  . Diverticulitis 09/07/2012  . C. difficile colitis 08/26/2011  . Abdominal pain, other specified site 08/26/2011  . Diarrhea 08/26/2011  . Abnormal finding on GI tract imaging 08/26/2011  . Enteritis 08/25/2011   Social History   Social History  . Marital status: Married    Spouse name: N/A  . Number of children: 3  . Years of education: N/A   Occupational History  . [place]   Social History Main Topics  . Smoking status: Current Every Day Smoker    Packs/day: 0.30    Years: 31.00    Types: Cigarettes  . Smokeless tobacco: Never Used     Comment: needs a  patch  . Alcohol use 0.0 oz/week     Comment: occasionally   . Drug use: No  . Sexual activity: Yes    Partners: Male    Birth control/ protection: Surgical   Other Topics Concern  . Not on file   Social History Narrative  . No narrative on file    [name]'s family history includes Diabetes type II in her father and mother; Stroke in her father.      Objective:    Vitals:   05/24/17 0948  BP: 120/70  Pulse: 82    Physical Exam   well-developed  African-American female in no acute distress, pleasant blood pressure 120/70 pulse 82, height 5 foot 5, weight 209, BMI 34.2. HEENT ;nontraumatic normocephalic EOMI PERRLA sclera anicteric, Cardiovascular; regular rate and rhythm with S1-S2 no murmur or gallop, Pulmonary; clear bilaterally, Abdomen; obese, soft bowel sounds are present she is tender in the left mid and left lower quadrant there is no rebound no mass or palpable hepatosplenomegaly, Rectal ;exam not done, 70s no clubbing cyanosis or edema skin warm and dry, Neuropsych; mood and affect appropriate       Assessment & Plan:   #19 46 year old African-American female with recent hospitalization 2 weeks ago with acute sigmoid diverticulitis complicated by a 2.6 cm abscess. Patient is completing a ten-day course of Augmentin today. Her pain is persisting though not as severe as when she was hospitalized. She had an episode of chills last evening and is also having some dysuria. The abscess was noted to be adjacent to the anterior uterus, posterior dome of the bladder and the posterior wall of the sigmoid. I'm concerned that she has persistent diverticulitis, and may have persistent abscess with bladder involvement  #2 remote history of C. difficile 2013 #3 obesity #4 status post hysterectomy #5 hypertension #6 sickle cell trait  Plan; CBC with differential, BMET, Patient needs repeat CT of the abdomen and pelvis within the next 24 hours. We will refill still Percocet 5/325 one by mouth every 6 hours when necessary for pain #40 and no refills  Sending  another 10 days of Augmentin 875 by mouth twice a day-. Patient is advised that she may require repeat hospitalization for IV antibiotics depending on results of CT. She will need eventual colonoscopy with Dr. Hilarie Garner but not until current episode has completely resolved.  Amanda Garner S Amanda Bingaman PA-C 05/24/2017   Cc: No ref. provider found   Addendum: Reviewed and agree with initial  management. I have reviewed the abd CT scan which shows slightly enlarging diverticular abscess which is not amenable to perc drainage Her WBC count is normal She has been on Augmentin since hospital discharge and is not improving.  This is consistent with failure of oral antibiotics She needs to be re-admitted for further IV antibiotics.  I expect she will need multiple days of IV antibiotics and definitive improvement before consideration of discharge.  If not improving, surgical consultation is recommended. Once recovered completely (and off abx), will eventually need colonoscopy Discussed with Dr. Wyline Copas.  Pt is nontoxic and okay for direct admission to med/surg bed.  CBC    Component Value Date/Time   WBC 8.4 05/24/2017 1045   RBC 5.98 (H) 05/24/2017 1045   HGB 13.2 05/24/2017 1045   HCT 40.8 05/24/2017 1045   PLT 269.0 05/24/2017 1045   MCV 68.2 Repeated and verified X2. (L) 05/24/2017 1045   MCH 22.2 (L) 05/13/2017 0443   MCHC 32.5 05/24/2017 1045  RDW 14.9 05/24/2017 1045   LYMPHSABS 2.8 05/24/2017 1045   MONOABS 0.6 05/24/2017 1045   EOSABS 0.3 05/24/2017 1045   BASOSABS 0.1 05/24/2017 1045   CMP     Component Value Date/Time   NA 140 05/24/2017 1045   K 4.0 05/24/2017 1045   CL 108 05/24/2017 1045   CO2 25 05/24/2017 1045   GLUCOSE 117 (H) 05/24/2017 1045   BUN 10 05/24/2017 1045   CREATININE 0.73 05/24/2017 1045   CALCIUM 9.6 05/24/2017 1045   PROT 7.1 05/11/2017 0824   ALBUMIN 3.8 05/11/2017 0824   AST 16 05/11/2017 0824   ALT 12 (L) 05/11/2017 0824   ALKPHOS 61 05/11/2017 0824   BILITOT 0.5 05/11/2017 0824   GFRNONAA >60 05/13/2017 0443   GFRAA >60 05/13/2017 0443   CT ABDOMEN AND PELVIS WITH CONTRAST   TECHNIQUE: Multidetector CT imaging of the abdomen and pelvis was performed using the standard protocol following bolus administration of intravenous contrast.   CONTRAST:  138m ISOVUE-300 IOPAMIDOL (ISOVUE-300) INJECTION 61%   COMPARISON:   05/11/2017   FINDINGS: Lower chest: No acute abnormality.   Hepatobiliary: Fatty infiltration of the liver is noted. The gallbladder is within normal limits.   Pancreas: Unremarkable. No pancreatic ductal dilatation or surrounding inflammatory changes.   Spleen: Normal in size without focal abnormality.   Adrenals/Urinary Tract: Adrenal glands are unremarkable. Kidneys are normal, without renal calculi, focal lesion, or hydronephrosis. Bladder is unremarkable.   Stomach/Bowel: Diverticular change of the colon is again identified as well as changes of diverticulitis within the sigmoid colon. Persistent abscess is noted within the pelvis lying along the superior aspect of the urinary bladder. It is somewhat bilobed and measures approximately 3.1 by 3.0 cm in its largest component best seen on image number 67 of series 2. Air and fluid is noted within the collection. Just inferior to this, there is a smaller air-fluid collection noted which measures approximately 2.1 cm in greatest dimension. This is best visualized on image number 69 of series 2. The 2 air-fluid areas intra communicate and lie just anterior to the residual vaginal wall. No contrast is noted within. No air is noted within the bladder to suggest fistulization.   Vascular/Lymphatic: No aortic abnormality is identified. Stable mesenteric lymph node is noted image number 46 of series 2. No other significant adenopathy is noted.   Reproductive: The uterus has been surgically removed. The left ovary is prominent but stable. The right ovary is within normal limits.   Other: No abdominal wall hernia or abnormality. No abdominopelvic ascites.   Musculoskeletal: No acute or significant osseous findings.   IMPRESSION: Slight increase in size of the air-fluid collection adjacent to the sigmoid colon consistent with an enlarging diverticular abscess. It remains inaccessible for percutaneous drainage due to its  proximity to the colon and multiple intervening small bowel loops. No evidence of fistulization is noted at this time to the bladder or vaginal cuff.   The remainder of the exam is stable in appearance.   These results will be called to the ordering clinician or representative by the Radiologist Assistant, and communication documented in the PACS or zVision Dashboard.     Electronically Signed   By: MInez CatalinaM.D.   On: 05/24/2017 15:12     Pyrtle, JLajuan Lines MD

## 2017-05-24 NOTE — Telephone Encounter (Signed)
Dr Hilarie Fredrickson has requested that I contact bed placement to get patient a directly admitted for diverticular abscess. Dr Earlie Counts has accepted patient. I have spoken to [facility] bed placement who states that she will put patient on list and contact patient when bed is available.  I have also contacted patient to advise of enlarging diverticular abscess and need for hospital admission for possible antibiotics and drainage of the abscess. Patient has been advised that bed placement will contact her when a bed is available. She should go to [facility] admitting if before 5 pm or to Emergency Room desk for direct admit after 5:00 pm. Should she not hear from bed placement prior to 8:00 pm this evening, she is to go to the emergency room to be evaluated and admitted from there. Patient verbalizes understanding of this.   Dr Hilarie Fredrickson states that Dr Loletha Carrow, the on call Dr is aware of this information and Amy Trellis Paganini, PA-C, the ordering provider of this scan is aware of this plan as well.

## 2017-05-24 NOTE — H&P (Signed)
History and Physical    Amanda Garner JME:[id number] DOB: [birth year] DOA: 05/24/2017  PCP: Patient, No Pcp Per - supposed to be referred to [facility]:  Pyrtle - GI Patient coming from:  Home - lives with husband; NOK: husband, [phone]  Chief Complaint: ongoing diverticulitis with abscess  HPI: Amanda Garner is a 46 y.o. female with medical history significant of HTN, colitis/diverticuluitis; obesity; and sickle cell trait presenting with abdominal pain.  She was hospitalized recently, admitted by me on 9/19 and discharged on 9/21 - she was discharged with PO Augmentin.  She went to GI today for f/u.  Previously her pain was 9/10 but now still 5-8/10.  She could tell she was not getting better - having ongoing abdominal pain and pain with urination.  No n/v.  +diarrhea today since CT contrast - she was having soft stools prior to today but not frank diarrhea.  She has had 6 loose stools since the contrast.  Fever last night, subjective.  Pain is worse with urinating, still having burning.  It feels like she is trying to pass stool through her urine.  She was told in the past that she had a colovesical fistula that would need repair.  She was also told that she had Crohn's, although she has never had a colonoscopy.  Dr. Brantley Stage from surgery consulted previously.  His recommendation was for medical treatment with antibiotics and a clear liquid diet (no further advancement) with repeat CT scan in 3-5 days.  In the GI office today.  CT today showed a slight increase in the size of the air-fluid collection adjacent to the sigmoid colon consistent with an enlarging diverticular abscess.  It is not accessing for percutaneous drainage.  There is no evidence of fistulization on this scan.  With the patient's presentation and CT, this is consistent with failure of outpatient antibiotics and so recommendation is for 5-7 days of IV antibiotics with plan for surgical  consultation if not improving.  She will eventually need colonoscopy.   Review of Systems: As per HPI; otherwise review of systems reviewed and negative.   Ambulatory Status:  Ambulates without assistance  Past Medical History:  Diagnosis Date  . Anemia Dx: 2001 or so   . Blood transfusion   . Crohn disease ([place])   . Diverticulitis   . Hypertension   . Sickle cell trait [facility])     Past Surgical History:  Procedure Laterality Date  . ABDOMINAL HYSTERECTOMY  2001   partial hysterectomy - emergency during last C-section  . CESAREAN SECTION  1991; 1997; 2001    Social History   Social History  . Marital status: Married    Spouse name: N/A  . Number of children: 3  . Years of education: N/A   Occupational History  . [place]   Social History Main Topics  . Smoking status: Current Every Day Smoker    Packs/day: 0.30    Years: 31.00    Types: Cigarettes  . Smokeless tobacco: Never Used     Comment: needs a patch  . Alcohol use 0.0 oz/week     Comment: occasionally   . Drug use: No  . Sexual activity: Yes    Partners: Male    Birth control/ protection: Surgical   Other Topics Concern  . Not on file   Social History Narrative  . No narrative on file    No Known  Allergies  Family History  Problem Relation Age of Onset  . Diabetes type II Mother   . Diabetes type II Father   . Stroke Father   . Lung cancer Father 49  . Colon cancer Neg Hx     Prior to Admission medications   Medication Sig Start Date End Date Taking? Authorizing Provider  amoxicillin-clavulanate (AUGMENTIN) 875-125 MG tablet Take 1 tab twice daily for 14 days. 05/24/17   Esterwood, Amy S, PA-C  oxyCODONE-acetaminophen (ROXICET) 5-325 MG tablet Take 1 tablet by mouth every 6 (six) hours as needed for severe pain. 05/24/17   Esterwood, Amy Chauncey Cruel, PA-C    Physical Exam: Vitals:   05/24/17 1846  BP: (!) 161/111  Pulse: 84    Resp: 18  Temp: 98.2 F (36.8 C)  TempSrc: Oral  SpO2: 100%     General: Appears calm and comfortable and is NAD Eyes:   EOMI, normal lids, iris ENT:  grossly normal hearing, lips & tongue, mmm; appropriate dentition Neck:  no LAD, masses or thyromegaly; no carotid bruits Cardiovascular:  RRR, no m/r/g. No LE edema.  Respiratory:   CTA bilaterally with no wheezes/rales/rhonchi.  Normal respiratory effort. Abdomen:  soft, mildly TTP in LLQ but her pannus is fairly large, ND, NABS Skin:  no rash or induration seen on limited exam Musculoskeletal:  grossly normal tone BUE/BLE, good ROM, no bony abnormality. Psychiatric:  grossly normal mood and affect, speech fluent and appropriate, AOx3 Neurologic:  CN 2-12 grossly intact, moves all extremities in coordinated fashion, sensation intact    Radiological Exams on Admission: Ct Abdomen Pelvis W Contrast  Result Date: 05/24/2017 CLINICAL DATA:  Follow-up diverticular abscess with persistent fevers pain, initial encounter EXAM: CT ABDOMEN AND PELVIS WITH CONTRAST TECHNIQUE: Multidetector CT imaging of the abdomen and pelvis was performed using the standard protocol following bolus administration of intravenous contrast. CONTRAST:  147m ISOVUE-300 IOPAMIDOL (ISOVUE-300) INJECTION 61% COMPARISON:  05/11/2017 FINDINGS: Lower chest: No acute abnormality. Hepatobiliary: Fatty infiltration of the liver is noted. The gallbladder is within normal limits. Pancreas: Unremarkable. No pancreatic ductal dilatation or surrounding inflammatory changes. Spleen: Normal in size without focal abnormality. Adrenals/Urinary Tract: Adrenal glands are unremarkable. Kidneys are normal, without renal calculi, focal lesion, or hydronephrosis. Bladder is unremarkable. Stomach/Bowel: Diverticular change of the colon is again identified as well as changes of diverticulitis within the sigmoid colon. Persistent abscess is noted within the pelvis lying along the superior aspect  of the urinary bladder. It is somewhat bilobed and measures approximately 3.1 by 3.0 cm in its largest component best seen on image number 67 of series 2. Air and fluid is noted within the collection. Just inferior to this, there is a smaller air-fluid collection noted which measures approximately 2.1 cm in greatest dimension. This is best visualized on image number 69 of series 2. The 2 air-fluid areas intra communicate and lie just anterior to the residual vaginal wall. No contrast is noted within. No air is noted within the bladder to suggest fistulization. Vascular/Lymphatic: No aortic abnormality is identified. Stable mesenteric lymph node is noted image number 46 of series 2. No other significant adenopathy is noted. Reproductive: The uterus has been surgically removed. The left ovary is prominent but stable. The right ovary is within normal limits. Other: No abdominal wall hernia or abnormality. No abdominopelvic ascites. Musculoskeletal: No acute or significant osseous findings. IMPRESSION: Slight increase in size of the air-fluid collection adjacent to the sigmoid colon consistent with an enlarging diverticular abscess. It remains  inaccessible for percutaneous drainage due to its proximity to the colon and multiple intervening small bowel loops. No evidence of fistulization is noted at this time to the bladder or vaginal cuff. The remainder of the exam is stable in appearance. These results will be called to the ordering clinician or representative by the Radiologist Assistant, and communication documented in the PACS or zVision Dashboard. Electronically Signed   By: Inez Catalina M.D.   On: 05/24/2017 15:12    EKG: not done  Labs on Admission: I have personally reviewed the available labs and imaging studies at the time of the admission.  Pertinent labs:   Glucose 117 WBC 8.4  Assessment/Plan Principal Problem:   Abscess of sigmoid colon due to diverticulitis Active Problems:   Tobacco  dependence   Marijuana abuse   Essential hypertension    Sigmoid diverticulitis with abscess -Patient's symptoms are c/w diverticulitis and her CT supports this as a diagnosis -She was diagnosed and treated during her prior hospitalization but has not shown significant improvement and possibly has worsened; she has failed outpatient antibiotics and possibly also inpatient treatment -Patient with h/o sigmoid diverticulitis in the same location based on prior CT scans available in Care Everywhere -There appears to have been concern for colovesical fistula in the past and possibly also colovaginal fistula -I do not see evidence of concern for Crohn's other than the possibility of fistula -Fistula does not appear to have been seen on today's CT -Will order UA and urine culture due to patient's symptoms -Patient currently presenting with abdominal pain and findings similar to prior presentations -She was previously seen by surgery and there was no need for surgery at that time; if she does not improve with antibiotics during this hospitalization, she is likely to need surgical re-consultation. -For now, will give bowel rest, IVF, pain medication with morphine, nausea medication with Zofran, and treat with Zosyn for intraabdominal infection -Suggest outpatient GI evaluation for colonoscopy once current issues are resolved  HTN -Reported h/o -Not on medications -Suboptimal control while in the hospital previously but not started on medication -Will order prn IV hydralazine -She likely needs initiation of chronic HTN medication  Tobacco dependence -Encourage cessation.  This was discussed with the patient and should be reviewed on an ongoing basis.   -Patch ordered at patient request.  Marijuana abuse -Cessation encouraged; this should be encouraged on an ongoing basis -UDS ordered    DVT prophylaxis:  Lovenox Code Status:  Full - confirmed with patient/family Family Communication:  Daughter and granddaughter present throughout evaluation; I also spoke with the patient's husband outside the room after her visit was concluded  Disposition Plan:  Home once clinically improved Consults called: GI; likely to also need surgery  Admission status: Admit - It is my clinical opinion that admission to INPATIENT is reasonable and necessary because this patient will require at least 2 midnights in the hospital to treat this condition based on the medical complexity of the problems presented.  Given the aforementioned information, the predictability of an adverse outcome is felt to be significant.    Karmen Bongo MD [facility]  If note is complete, please contact covering daytime or nighttime physician. [email or web address] Password [email or web address]  05/24/2017, 8:22 PM

## 2017-05-24 NOTE — Patient Instructions (Addendum)
Please go to the basement level to have your labs drawn. We have provided you with a note for work for today's visit.   We have sent the following medications to your pharmacy for you to pick up at your convenience: [address] [place], [place]. 1. Amoxicillin 875 mg/125 mg.  We have given you a signed prescription for Percocet 5/325 mg to take to your pharmacy.    You have been scheduled for a CT scan of the abdomen and pelvis at [facility] ([address]---this is in the same building as Press photographer).   You are scheduled today 05-24-2017 at 1:45 PM . You should arrive at 1:30 PM  to your appointment time for registration. Please follow the written instructions below on the day of your exam:  WARNING: IF YOU ARE ALLERGIC TO IODINE/X-RAY DYE, PLEASE NOTIFY RADIOLOGY IMMEDIATELY AT [phone]! YOU WILL BE GIVEN A 13 HOUR PREMEDICATION PREP.  1) Do not eat until after the CT scan. You can drink.   2) You have been given 2 bottles of oral contrast to drink. The solution may taste               better if refrigerated, but do NOT add ice or any other liquid to this solution. Shake             well before drinking.    Drink 1 bottle of contrast @ 11:45 Am (2 hours prior to your exam)  Drink 1 bottle of contrast @ 12:45 am (1 hour prior to your exam)  You may take any medications as prescribed with a small amount of water except for the following: Metformin, Glucophage, Glucovance, Avandamet, Riomet, Fortamet, Actoplus Met, Janumet, Glumetza or Metaglip. The above medications must be held the day of the exam AND 48 hours after the exam.  The purpose of you drinking the oral contrast is to aid in the visualization of your intestinal tract. The contrast solution may cause some diarrhea. Before your exam is started, you will be given a small amount of fluid to drink. Depending on your individual set of symptoms, you may also receive an intravenous injection of x-ray  contrast/dye. Plan on being at [facility] for 30 minutes or long, depending on the type of exam you are having performed.  If you have any questions regarding your exam or if you need to reschedule, you may call the CT department at [phone] between the hours of 8:00 am and 5:00 pm, Monday-Friday.  If you are age 19 or younger, your body mass index should be between 19-25. Your Body mass index is 34.25 kg/m. If this is out of the aformentioned range listed, please consider follow up with your Primary Care Provider.    ________________________________________________________________________

## 2017-05-24 NOTE — Progress Notes (Signed)
Pharmacy Antibiotic Note  Amanda Garner is a 46 y.o. female recently admitted to [facility] and treated for acute sigmoid diverticulitis with pericolonic abscess.  During that admission she was treated with zosyn and discharged on 05/13/17 with augmentin for 10 days.  She saw her GI MD on 10/2 with CT on 10/2 showed findings consistent with  an enlarging diverticular abscess.  She was instructed to come to [facility] for management of abscess.  To start zosyn for diverticular abscess.  - scr 0.73 (crcl~99)  Plan: - zosyn 3.375 gm IV q8h (infuse over 4 hrs) - with good renal function, pharmacy will sign off.  Re-consult [place] if need further assistance.  _____________________________     Temp (24hrs), Avg:98.2 F (36.8 C), Min:98.2 F (36.8 C), Max:98.2 F (36.8 C)   Recent Labs Lab 05/24/17 1045  WBC 8.4  CREATININE 0.73    Estimated Creatinine Clearance: 99.9 mL/min (by C-G formula based on SCr of 0.73 mg/dL).    No Known Allergies  Thank you for allowing pharmacy to be a part of this patient's care.  Lynelle Doctor 05/24/2017 8:33 PM

## 2017-05-24 NOTE — Progress Notes (Signed)
Dr. Earlie Counts called nurse and instructed nurse to call admission to have patient assigned. Nurse paged admissions for hospitalists.

## 2017-05-25 DIAGNOSIS — K5720 Diverticulitis of large intestine with perforation and abscess without bleeding: Principal | ICD-10-CM

## 2017-05-25 DIAGNOSIS — I10 Essential (primary) hypertension: Secondary | ICD-10-CM

## 2017-05-25 LAB — URINALYSIS, ROUTINE W REFLEX MICROSCOPIC
Bilirubin Urine: NEGATIVE
Glucose, UA: NEGATIVE mg/dL
KETONES UR: NEGATIVE mg/dL
Nitrite: NEGATIVE
PH: 6 (ref 5.0–8.0)
Protein, ur: NEGATIVE mg/dL
Specific Gravity, Urine: 1.03 (ref 1.005–1.030)

## 2017-05-25 LAB — CBC
HEMATOCRIT: 35.4 % — AB (ref 36.0–46.0)
HEMOGLOBIN: 11.8 g/dL — AB (ref 12.0–15.0)
MCH: 22 pg — ABNORMAL LOW (ref 26.0–34.0)
MCHC: 33.3 g/dL (ref 30.0–36.0)
MCV: 66 fL — ABNORMAL LOW (ref 78.0–100.0)
Platelets: 216 10*3/uL (ref 150–400)
RBC: 5.36 MIL/uL — ABNORMAL HIGH (ref 3.87–5.11)
RDW: 14.7 % (ref 11.5–15.5)
WBC: 9.2 10*3/uL (ref 4.0–10.5)

## 2017-05-25 LAB — BASIC METABOLIC PANEL
ANION GAP: 8 (ref 5–15)
BUN: 9 mg/dL (ref 6–20)
CALCIUM: 8.9 mg/dL (ref 8.9–10.3)
CO2: 23 mmol/L (ref 22–32)
Chloride: 110 mmol/L (ref 101–111)
Creatinine, Ser: 0.66 mg/dL (ref 0.44–1.00)
GFR calc Af Amer: >60 mL/min (ref >60)
Glucose, Bld: 106 mg/dL — ABNORMAL HIGH (ref 65–99)
POTASSIUM: 3.6 mmol/L (ref 3.5–5.1)
SODIUM: 141 mmol/L (ref 135–145)

## 2017-05-25 LAB — RAPID URINE DRUG SCREEN, HOSP PERFORMED
AMPHETAMINES: NOT DETECTED
BARBITURATES: NOT DETECTED
BENZODIAZEPINES: NOT DETECTED
COCAINE: NOT DETECTED
Opiates: POSITIVE — AB
Tetrahydrocannabinol: POSITIVE — AB

## 2017-05-25 MED ORDER — MORPHINE SULFATE (PF) 4 MG/ML IV SOLN
2.0000 mg | INTRAVENOUS | Status: DC | PRN
  Administered 2017-05-25 – 2017-05-26 (×4): 2 mg via INTRAVENOUS
  Filled 2017-05-25 (×4): qty 1

## 2017-05-25 NOTE — Progress Notes (Signed)
[facility] PROGRESS NOTE  Amanda Garner YQI:[id number] DOB: 09-21-1970 DOA: 05/24/2017  PCP: Patient, No Pcp Per  Brief History/Interval Summary: 46 year old African-American female with a past medical history of hypertension, recurrent diverticulitis, obesity, sickle cell trait, recently hospitalized in September for diverticulitis and was discharged on oral Augmentin. She went to her gastroenterologist for follow-up and mentioned persistent symptoms. CT scan was repeated, which showed slight increase in the size of the air-fluid collection adjacent to the sigmoid colon concerning for an enlarging diverticular abscess. Patient was hospitalized for further management.  Reason for Visit: Sigmoid diverticulitis with abscess  Consultants: Gastroenterology  Procedures: None  Antibiotics: Zosyn  Subjective/Interval History: Patient states that she is feeling better today compared to yesterday. Pain is 4 out of 10 in intensity. Denies any nausea, vomiting this morning. Passing gas, but no bowel movements yet. Feels distended.  ROS: Denies shortness of breath or chest pain  Objective:  Vital Signs  Vitals:   05/24/17 2154 05/24/17 2155 05/24/17 2327 05/25/17 0503  BP: (!) 157/103 (!) 160/104  139/89  Pulse: 85   72  Resp: 18   16  Temp: 98.2 F (36.8 C)   98.3 F (36.8 C)  TempSrc: Oral     SpO2: 100%   96%  Weight:   94.8 kg (209 lb)   Height:   5' 5"  (1.651 m)     Intake/Output Summary (Last 24 hours) at 05/25/17 1344 Last data filed at 05/25/17 0600  Gross per 24 hour  Intake              700 ml  Output              700 ml  Net                0 ml   Filed Weights   05/24/17 2327  Weight: 94.8 kg (209 lb)    General appearance: alert, cooperative, appears stated age and no distress Head: Normocephalic, without obvious abnormality, atraumatic Resp: clear to auscultation bilaterally Cardio: regular rate and rhythm, S1, S2 normal, no murmur, click, rub or  gallop GI: Abdomen is soft. Mildly tender in the left lower quadrant without any rebound, rigidity or guarding. No masses or organomegaly. Bowel Sounds are present. Extremities: extremities normal, atraumatic, no cyanosis or edema Neurologic: Awake, alert. Oriented 3. No focal neurological deficits  Lab Results:  Data Reviewed: I have personally reviewed following labs and imaging studies  CBC:  Recent Labs Lab 05/24/17 1045 05/25/17 0539  WBC 8.4 9.2  NEUTROABS 4.7  --   HGB 13.2 11.8*  HCT 40.8 35.4*  MCV 68.2 Repeated and verified X2.* 66.0*  PLT 269.0 387    Basic Metabolic Panel:  Recent Labs Lab 05/24/17 1045 05/25/17 0539  NA 140 141  K 4.0 3.6  CL 108 110  CO2 25 23  GLUCOSE 117* 106*  BUN 10 9  CREATININE 0.73 0.66  CALCIUM 9.6 8.9    GFR: Estimated Creatinine Clearance: 98.9 mL/min (by C-G formula based on SCr of 0.66 mg/dL).   Radiology Studies: Ct Abdomen Pelvis W Contrast  Result Date: 05/24/2017 CLINICAL DATA:  Follow-up diverticular abscess with persistent fevers pain, initial encounter EXAM: CT ABDOMEN AND PELVIS WITH CONTRAST TECHNIQUE: Multidetector CT imaging of the abdomen and pelvis was performed using the standard protocol following bolus administration of intravenous contrast. CONTRAST:  153m ISOVUE-300 IOPAMIDOL (ISOVUE-300) INJECTION 61% COMPARISON:  05/11/2017 FINDINGS: Lower chest: No acute abnormality. Hepatobiliary: Fatty infiltration of  the liver is noted. The gallbladder is within normal limits. Pancreas: Unremarkable. No pancreatic ductal dilatation or surrounding inflammatory changes. Spleen: Normal in size without focal abnormality. Adrenals/Urinary Tract: Adrenal glands are unremarkable. Kidneys are normal, without renal calculi, focal lesion, or hydronephrosis. Bladder is unremarkable. Stomach/Bowel: Diverticular change of the colon is again identified as well as changes of diverticulitis within the sigmoid colon. Persistent abscess  is noted within the pelvis lying along the superior aspect of the urinary bladder. It is somewhat bilobed and measures approximately 3.1 by 3.0 cm in its largest component best seen on image number 67 of series 2. Air and fluid is noted within the collection. Just inferior to this, there is a smaller air-fluid collection noted which measures approximately 2.1 cm in greatest dimension. This is best visualized on image number 69 of series 2. The 2 air-fluid areas intra communicate and lie just anterior to the residual vaginal wall. No contrast is noted within. No air is noted within the bladder to suggest fistulization. Vascular/Lymphatic: No aortic abnormality is identified. Stable mesenteric lymph node is noted image number 46 of series 2. No other significant adenopathy is noted. Reproductive: The uterus has been surgically removed. The left ovary is prominent but stable. The right ovary is within normal limits. Other: No abdominal wall hernia or abnormality. No abdominopelvic ascites. Musculoskeletal: No acute or significant osseous findings. IMPRESSION: Slight increase in size of the air-fluid collection adjacent to the sigmoid colon consistent with an enlarging diverticular abscess. It remains inaccessible for percutaneous drainage due to its proximity to the colon and multiple intervening small bowel loops. No evidence of fistulization is noted at this time to the bladder or vaginal cuff. The remainder of the exam is stable in appearance. These results will be called to the ordering clinician or representative by the Radiologist Assistant, and communication documented in the PACS or zVision Dashboard. Electronically Signed   By: Inez Catalina M.D.   On: 05/24/2017 15:12     Medications:  Scheduled: . docusate sodium  100 mg Oral BID  . enoxaparin (LOVENOX) injection  40 mg Subcutaneous Q24H  . nicotine  7 mg Transdermal Daily   Continuous: . lactated ringers 100 mL/hr at 05/24/17 2107  .  piperacillin-tazobactam (ZOSYN)  IV 3.375 g (05/25/17 1257)   LYY:[id number] **OR** acetaminophen, hydrALAZINE, morphine injection, ondansetron **OR** ondansetron (ZOFRAN) IV  Assessment/Plan:  Principal Problem:   Abscess of sigmoid colon due to diverticulitis Active Problems:   Tobacco dependence   Marijuana abuse   Essential hypertension    Sigmoid diverticulitis with abscess. Patient was started on Zosyn, which will be continued. Gastroenterology to follow patient. Previously seen by general surgery and there was no indication for surgical intervention at that time. Previously, there was concern for a colovesical fistula, but none noted in the CT scan done yesterday. Defer management to gastroenterology. Pain control.  Dysuria Patient mentions burning sensation with urination. UA reviewed. Continue antibiotics for now. Follow up on cultures.  Essential hypertension. Does not take any medications for the same at home. Continue to monitor blood pressures closely.  Tobacco dependence Cessation was encouraged. Nicotine Patch.  Marijuana abuse Cessation was encouraged.  DVT Prophylaxis: Lovenox    Code Status: Full code  Family Communication: Discussed with the patient  Disposition Plan: Management as outlined above    LOS: 1 day   [facility] Pager [phone] 05/25/2017, 1:44 PM  If 7PM-7AM, please contact night-coverage at [email or web address], password [email or web address]

## 2017-05-25 NOTE — Progress Notes (Signed)
[facility] Progress Note  Chief Complaint:    Diverticulitis  Subjective: Feels better than yesterday, "sore" in lower abdomen. Thirsty.   Objective:  Vital signs in last 24 hours: Temp:  [98.2 F (36.8 C)-98.3 F (36.8 C)] 98.3 F (36.8 C) (10/03 0503) Pulse Rate:  [72-85] 72 (10/03 0503) Resp:  [16-18] 16 (10/03 0503) BP: (139-161)/(89-111) 139/89 (10/03 0503) SpO2:  [96 %-100 %] 96 % (10/03 0503) Weight:  [209 lb (94.8 kg)] 209 lb (94.8 kg) (10/02 2327) Last BM Date: 05/24/17   General:   Alert, well-developed, AA female in NAD EENT:  Normal hearing, non icteric sclera, conjunctive pink.  Heart:  Regular rate and rhythm; no murmurs.no lower extremity edema Pulm: Normal respiratory effort, lungs CTA bilaterally without wheezes or crackles. Abdomen:  Soft, nondistended, mild-moderate LLQ tenderness. Normal bowel sounds, no masses felt.     Neurologic:  Alert and  oriented x4;  grossly normal neurologically. Psych:  Pleasant, cooperative.  Normal mood and affect.   Intake/Output from previous day: 10/02 0701 - 10/03 0700 In: 700 [I.V.:400; IV Piggyback:300] Out: 700 [Urine:700] Intake/Output this shift: No intake/output data recorded.  Lab Results:  Recent Labs  05/24/17 1045 05/25/17 0539  WBC 8.4 9.2  HGB 13.2 11.8*  HCT 40.8 35.4*  PLT 269.0 216   BMET  Recent Labs  05/24/17 1045 05/25/17 0539  NA 140 141  K 4.0 3.6  CL 108 110  CO2 25 23  GLUCOSE 117* 106*  BUN 10 9  CREATININE 0.73 0.66  CALCIUM 9.6 8.9   Ct Abdomen Pelvis W Contrast  Result Date: 05/24/2017 CLINICAL DATA:  Follow-up diverticular abscess with persistent fevers pain, initial encounter EXAM: CT ABDOMEN AND PELVIS WITH CONTRAST TECHNIQUE: Multidetector CT imaging of the abdomen and pelvis was performed using the standard protocol following bolus administration of intravenous contrast. CONTRAST:  181m ISOVUE-300 IOPAMIDOL (ISOVUE-300) INJECTION 61% COMPARISON:   05/11/2017 FINDINGS: Lower chest: No acute abnormality. Hepatobiliary: Fatty infiltration of the liver is noted. The gallbladder is within normal limits. Pancreas: Unremarkable. No pancreatic ductal dilatation or surrounding inflammatory changes. Spleen: Normal in size without focal abnormality. Adrenals/Urinary Tract: Adrenal glands are unremarkable. Kidneys are normal, without renal calculi, focal lesion, or hydronephrosis. Bladder is unremarkable. Stomach/Bowel: Diverticular change of the colon is again identified as well as changes of diverticulitis within the sigmoid colon. Persistent abscess is noted within the pelvis lying along the superior aspect of the urinary bladder. It is somewhat bilobed and measures approximately 3.1 by 3.0 cm in its largest component best seen on image number 67 of series 2. Air and fluid is noted within the collection. Just inferior to this, there is a smaller air-fluid collection noted which measures approximately 2.1 cm in greatest dimension. This is best visualized on image number 69 of series 2. The 2 air-fluid areas intra communicate and lie just anterior to the residual vaginal wall. No contrast is noted within. No air is noted within the bladder to suggest fistulization. Vascular/Lymphatic: No aortic abnormality is identified. Stable mesenteric lymph node is noted image number 46 of series 2. No other significant adenopathy is noted. Reproductive: The uterus has been surgically removed. The left ovary is prominent but stable. The right ovary is within normal limits. Other: No abdominal wall hernia or abnormality. No abdominopelvic ascites. Musculoskeletal: No acute or significant osseous findings. IMPRESSION: Slight increase in size of the air-fluid collection adjacent to the sigmoid colon consistent with an enlarging diverticular abscess. It remains inaccessible  for percutaneous drainage due to its proximity to the colon and multiple intervening small bowel loops. No  evidence of fistulization is noted at this time to the bladder or vaginal cuff. The remainder of the exam is stable in appearance. These results will be called to the ordering clinician or representative by the Radiologist Assistant, and communication documented in the PACS or zVision Dashboard. Electronically Signed   By: Inez Catalina M.D.   On: 05/24/2017 15:12    ASSESSMENT / PLAN:   1. Pleasant 46 yo female with sigmoid diverticulitis complicated by an abscess which has enlarged since 05/11/17.  It remains inaccessible for perc drainage due to proximity to the colon and multiple intervening bowel loops. No evidence for fistulization to bladder or uterus.  -Feels better today, no pain meds needed in several hours.  WBC normal. Continue Zosyn, supportive care.  -trial of clears -eventual outpatient colonoscopy  2. Dysuria. Resolved. Some RBCs present in U/A. WBC 6-30 / squamous epith  Principal Problem:   Abscess of sigmoid colon due to diverticulitis Active Problems:   Tobacco dependence   Marijuana abuse   Essential hypertension    LOS: 1 day   Tye Savoy ,NP 05/25/2017, 11:04 AM  Pager number [phone]     Attending physician's note   I have taken an interval history, reviewed the chart and examined the patient. I agree with the Advanced Practitioner's note, impression and recommendations.   Lucio Edward, MD [facility] [phone] Mon-Fri 8a-5p [phone] after 5p, weekends, holidays

## 2017-05-26 DIAGNOSIS — R300 Dysuria: Secondary | ICD-10-CM

## 2017-05-26 MED ORDER — PANTOPRAZOLE SODIUM 40 MG PO TBEC
40.0000 mg | DELAYED_RELEASE_TABLET | Freq: Every day | ORAL | Status: DC
  Administered 2017-05-26 – 2017-05-30 (×5): 40 mg via ORAL
  Filled 2017-05-26 (×6): qty 1

## 2017-05-26 NOTE — Progress Notes (Signed)
[facility] Progress Note  CC:  Diverticulitis  Subjective:  Feeling better.  Tolerated clear liquids well and asking to advance.  Passing gas but no BM's.  Painful urination improving.  Objective:  Vital signs in last 24 hours: Temp:  [98.4 F (36.9 C)-99 F (37.2 C)] 98.4 F (36.9 C) (10/04 0525) Pulse Rate:  [78-85] 85 (10/04 0525) Resp:  [16] 16 (10/04 0525) BP: (134-152)/(82-100) 136/92 (10/04 0525) SpO2:  [94 %-98 %] 94 % (10/04 0525) Last BM Date: 05/24/17 General:  Alert, Well-developed, in NAD Heart:  Regular rate and rhythm; no murmurs Pulm:  CTAB.  No increased WOB. Abdomen:  Soft, non-distended.  BS present.  Mild to moderate lower abdominal TTP > left.   Extremities:  Without edema. Neurologic:  Alert and oriented x 4;  grossly normal neurologically. Psych:  Alert and cooperative. Normal mood and affect.  Intake/Output from previous day: 10/03 0701 - 10/04 0700 In: 740 [P.O.:240; I.V.:400; IV Piggyback:100] Out: -  Intake/Output this shift: No intake/output data recorded.  Lab Results:  Recent Labs  05/24/17 1045 05/25/17 0539  WBC 8.4 9.2  HGB 13.2 11.8*  HCT 40.8 35.4*  PLT 269.0 216   BMET  Recent Labs  05/24/17 1045 05/25/17 0539  NA 140 141  K 4.0 3.6  CL 108 110  CO2 25 23  GLUCOSE 117* 106*  BUN 10 9  CREATININE 0.73 0.66  CALCIUM 9.6 8.9   Ct Abdomen Pelvis W Contrast  Result Date: 05/24/2017 CLINICAL DATA:  Follow-up diverticular abscess with persistent fevers pain, initial encounter EXAM: CT ABDOMEN AND PELVIS WITH CONTRAST TECHNIQUE: Multidetector CT imaging of the abdomen and pelvis was performed using the standard protocol following bolus administration of intravenous contrast. CONTRAST:  161m ISOVUE-300 IOPAMIDOL (ISOVUE-300) INJECTION 61% COMPARISON:  05/11/2017 FINDINGS: Lower chest: No acute abnormality. Hepatobiliary: Fatty infiltration of the liver is noted. The gallbladder is within normal limits.  Pancreas: Unremarkable. No pancreatic ductal dilatation or surrounding inflammatory changes. Spleen: Normal in size without focal abnormality. Adrenals/Urinary Tract: Adrenal glands are unremarkable. Kidneys are normal, without renal calculi, focal lesion, or hydronephrosis. Bladder is unremarkable. Stomach/Bowel: Diverticular change of the colon is again identified as well as changes of diverticulitis within the sigmoid colon. Persistent abscess is noted within the pelvis lying along the superior aspect of the urinary bladder. It is somewhat bilobed and measures approximately 3.1 by 3.0 cm in its largest component best seen on image number 67 of series 2. Air and fluid is noted within the collection. Just inferior to this, there is a smaller air-fluid collection noted which measures approximately 2.1 cm in greatest dimension. This is best visualized on image number 69 of series 2. The 2 air-fluid areas intra communicate and lie just anterior to the residual vaginal wall. No contrast is noted within. No air is noted within the bladder to suggest fistulization. Vascular/Lymphatic: No aortic abnormality is identified. Stable mesenteric lymph node is noted image number 46 of series 2. No other significant adenopathy is noted. Reproductive: The uterus has been surgically removed. The left ovary is prominent but stable. The right ovary is within normal limits. Other: No abdominal wall hernia or abnormality. No abdominopelvic ascites. Musculoskeletal: No acute or significant osseous findings. IMPRESSION: Slight increase in size of the air-fluid collection adjacent to the sigmoid colon consistent with an enlarging diverticular abscess. It remains inaccessible for percutaneous drainage due to its proximity to the colon and multiple intervening small bowel loops. No evidence of fistulization  is noted at this time to the bladder or vaginal cuff. The remainder of the exam is stable in appearance. These results will be called  to the ordering clinician or representative by the Radiologist Assistant, and communication documented in the PACS or zVision Dashboard. Electronically Signed   By: Inez Catalina M.D.   On: 05/24/2017 15:12   Assessment / Plan: 1. Pleasant 46 yo female with sigmoid diverticulitis complicated by an abscess which has enlarged since 05/11/17.  It remains inaccessible for perc drainage due to proximity to the colon and multiple intervening bowel loops. No evidence for fistulization to bladder or uterus.  -Continue Zosyn, supportive care.  -Will increase to full liquids for now. -eventual outpatient colonoscopy  2. Dysuria.  Improving. Some RBCs present in U/A. WBC 6-30 / squamous epith   LOS: 2 days   ZEHR, JESSICA D.  05/26/2017, 9:09 AM  Pager number [phone]     Attending physician's note   I have taken an interval history, reviewed the chart and examined the patient. I agree with the Advanced Practitioner's note, impression and recommendations. Continue IV Zosyn and supportive care. Full liquids today as tolerated. If dysuria or pyuria persist will need Urology consult to further evaluate.  Lucio Edward, MD [facility] [phone] Mon-Fri 8a-5p [phone] after 5p, weekends, holidays

## 2017-05-26 NOTE — Progress Notes (Signed)
[facility] PROGRESS NOTE  Amanda Garner TUU:[id number] DOB: 10/14/1970 DOA: 05/24/2017  PCP: Patient, No Pcp Per  Brief History/Interval Summary: 46 year old African-American female with a past medical history of hypertension, recurrent diverticulitis, obesity, sickle cell trait, recently hospitalized in September for diverticulitis and was discharged on oral Augmentin. She went to her gastroenterologist for follow-up and mentioned persistent symptoms. CT scan was repeated, which showed slight increase in the size of the air-fluid collection adjacent to the sigmoid colon concerning for an enlarging diverticular abscess. Patient was hospitalized for further management.  Reason for Visit: Sigmoid diverticulitis with abscess  Consultants: Gastroenterology  Procedures: None  Antibiotics: Zosyn  Subjective/Interval History: Patient continues to feel better. Pain is 2-3 out of 10 in intensity. Continues to pass gas. No bowel movements yet. Urinary discomfort has improved.   ROS: Denies any shortness of breath  Objective:  Vital Signs  Vitals:   05/25/17 0503 05/25/17 1602 05/25/17 2135 05/26/17 0525  BP: 139/89 134/82 (!) 152/100 (!) 136/92  Pulse: 72 78 79 85  Resp: 16 16 16 16   Temp: 98.3 F (36.8 C) 98.4 F (36.9 C) 99 F (37.2 C) 98.4 F (36.9 C)  TempSrc:  Oral Oral Oral  SpO2: 96% 97% 98% 94%  Weight:      Height:        Intake/Output Summary (Last 24 hours) at 05/26/17 1235 Last data filed at 05/26/17 0600  Gross per 24 hour  Intake              740 ml  Output                0 ml  Net              740 ml   Filed Weights   05/24/17 2327  Weight: 94.8 kg (209 lb)    General appearance: Awake, alert. In no distress. Resp: Clear to auscultation bilaterally. Normal effort. Cardio: S1, S2 is normal, regular. No S3, S4. No rubs, murmurs, or bruit. GI: Abdomen is soft. Mildly tender in the left lower quadrant without any rebound, rigidity or guarding.  Less tender today compared to yesterday. No masses or organomegaly. Bowel sounds are present. Extremities: No edema Neurologic: Awake, alert. Oriented 3. No focal neurological deficits  Lab Results:  Data Reviewed: I have personally reviewed following labs and imaging studies  CBC:  Recent Labs Lab 05/24/17 1045 05/25/17 0539  WBC 8.4 9.2  NEUTROABS 4.7  --   HGB 13.2 11.8*  HCT 40.8 35.4*  MCV 68.2 Repeated and verified X2.* 66.0*  PLT 269.0 791    Basic Metabolic Panel:  Recent Labs Lab 05/24/17 1045 05/25/17 0539  NA 140 141  K 4.0 3.6  CL 108 110  CO2 25 23  GLUCOSE 117* 106*  BUN 10 9  CREATININE 0.73 0.66  CALCIUM 9.6 8.9    GFR: Estimated Creatinine Clearance: 98.9 mL/min (by C-G formula based on SCr of 0.66 mg/dL).   Radiology Studies: Ct Abdomen Pelvis W Contrast  Result Date: 05/24/2017 CLINICAL DATA:  Follow-up diverticular abscess with persistent fevers pain, initial encounter EXAM: CT ABDOMEN AND PELVIS WITH CONTRAST TECHNIQUE: Multidetector CT imaging of the abdomen and pelvis was performed using the standard protocol following bolus administration of intravenous contrast. CONTRAST:  162m ISOVUE-300 IOPAMIDOL (ISOVUE-300) INJECTION 61% COMPARISON:  05/11/2017 FINDINGS: Lower chest: No acute abnormality. Hepatobiliary: Fatty infiltration of the liver is noted. The gallbladder is within normal limits. Pancreas: Unremarkable. No pancreatic ductal dilatation  or surrounding inflammatory changes. Spleen: Normal in size without focal abnormality. Adrenals/Urinary Tract: Adrenal glands are unremarkable. Kidneys are normal, without renal calculi, focal lesion, or hydronephrosis. Bladder is unremarkable. Stomach/Bowel: Diverticular change of the colon is again identified as well as changes of diverticulitis within the sigmoid colon. Persistent abscess is noted within the pelvis lying along the superior aspect of the urinary bladder. It is somewhat bilobed and  measures approximately 3.1 by 3.0 cm in its largest component best seen on image number 67 of series 2. Air and fluid is noted within the collection. Just inferior to this, there is a smaller air-fluid collection noted which measures approximately 2.1 cm in greatest dimension. This is best visualized on image number 69 of series 2. The 2 air-fluid areas intra communicate and lie just anterior to the residual vaginal wall. No contrast is noted within. No air is noted within the bladder to suggest fistulization. Vascular/Lymphatic: No aortic abnormality is identified. Stable mesenteric lymph node is noted image number 46 of series 2. No other significant adenopathy is noted. Reproductive: The uterus has been surgically removed. The left ovary is prominent but stable. The right ovary is within normal limits. Other: No abdominal wall hernia or abnormality. No abdominopelvic ascites. Musculoskeletal: No acute or significant osseous findings. IMPRESSION: Slight increase in size of the air-fluid collection adjacent to the sigmoid colon consistent with an enlarging diverticular abscess. It remains inaccessible for percutaneous drainage due to its proximity to the colon and multiple intervening small bowel loops. No evidence of fistulization is noted at this time to the bladder or vaginal cuff. The remainder of the exam is stable in appearance. These results will be called to the ordering clinician or representative by the Radiologist Assistant, and communication documented in the PACS or zVision Dashboard. Electronically Signed   By: Inez Catalina M.D.   On: 05/24/2017 15:12     Medications:  Scheduled: . docusate sodium  100 mg Oral BID  . enoxaparin (LOVENOX) injection  40 mg Subcutaneous Q24H  . nicotine  7 mg Transdermal Daily   Continuous: . lactated ringers 100 mL/hr at 05/25/17 1850  . piperacillin-tazobactam (ZOSYN)  IV Stopped (05/26/17 0840)   OIZ:[id number] **OR** acetaminophen, hydrALAZINE,  morphine injection, ondansetron **OR** ondansetron (ZOFRAN) IV  Assessment/Plan:  Principal Problem:   Abscess of sigmoid colon due to diverticulitis Active Problems:   Tobacco dependence   Marijuana abuse   Essential hypertension    Sigmoid diverticulitis with abscess. The abscess is inaccessible to percutaneous drainage. Patient seems to be slowly improving. Continue Zosyn. Gastroenterology is following. Previously seen by general surgery and there was no indication for surgical intervention at that time. Previously, there was concern for a colovesical fistula, but none noted in the CT scan done yesterday. Defer management to gastroenterology. Pain control.  Dysuria Patient mentions burning sensation with urination. UA reviewed. Continue antibiotics for now. Urine culture with 50,000 colonies of gram-negative rods. Continue to wait for final identification.  Essential hypertension. Does not take any medications for the same at home. Blood pressure is reasonably well controlled with occasional high readings. Continue to monitor blood pressures closely.  Microcytic anemia. Hemoglobin stable for the most part. No evidence of overt bleeding. Check anemia panel.  Tobacco dependence Cessation was encouraged. Nicotine Patch.  Marijuana abuse Cessation was encouraged.  DVT Prophylaxis: Lovenox    Code Status: Full code  Family Communication: Discussed with the patient  Disposition Plan: Management as outlined above. Mobilize. Repeat labs tomorrow.  LOS: 2 days   [place] [facility] Pager [phone] 05/26/2017, 12:35 PM  If 7PM-7AM, please contact night-coverage at [email or web address], password [facility]

## 2017-05-27 DIAGNOSIS — F17200 Nicotine dependence, unspecified, uncomplicated: Secondary | ICD-10-CM

## 2017-05-27 LAB — URINE CULTURE

## 2017-05-27 LAB — CBC
HEMATOCRIT: 35.1 % — AB (ref 36.0–46.0)
Hemoglobin: 11.8 g/dL — ABNORMAL LOW (ref 12.0–15.0)
MCH: 22.1 pg — ABNORMAL LOW (ref 26.0–34.0)
MCHC: 33.6 g/dL (ref 30.0–36.0)
MCV: 65.9 fL — ABNORMAL LOW (ref 78.0–100.0)
Platelets: 208 10*3/uL (ref 150–400)
RBC: 5.33 MIL/uL — ABNORMAL HIGH (ref 3.87–5.11)
RDW: 14.5 % (ref 11.5–15.5)
WBC: 7.4 10*3/uL (ref 4.0–10.5)

## 2017-05-27 LAB — RETICULOCYTES
RBC.: 5.33 MIL/uL — AB (ref 3.87–5.11)
RETIC COUNT ABSOLUTE: 80 10*3/uL (ref 19.0–186.0)
Retic Ct Pct: 1.5 % (ref 0.4–3.1)

## 2017-05-27 LAB — BASIC METABOLIC PANEL
Anion gap: 8 (ref 5–15)
BUN: 5 mg/dL — ABNORMAL LOW (ref 6–20)
CALCIUM: 8.8 mg/dL — AB (ref 8.9–10.3)
CO2: 23 mmol/L (ref 22–32)
CREATININE: 0.74 mg/dL (ref 0.44–1.00)
Chloride: 108 mmol/L (ref 101–111)
Glucose, Bld: 107 mg/dL — ABNORMAL HIGH (ref 65–99)
Potassium: 3.6 mmol/L (ref 3.5–5.1)
Sodium: 139 mmol/L (ref 135–145)

## 2017-05-27 LAB — IRON AND TIBC
Iron: 34 ug/dL (ref 28–170)
Saturation Ratios: 15 % (ref 10.4–31.8)
TIBC: 223 ug/dL — ABNORMAL LOW (ref 250–450)
UIBC: 189 ug/dL

## 2017-05-27 LAB — FERRITIN: FERRITIN: 190 ng/mL (ref 11–307)

## 2017-05-27 LAB — FOLATE: FOLATE: 8.8 ng/mL (ref >5.9)

## 2017-05-27 LAB — VITAMIN B12: Vitamin B-12: 663 pg/mL (ref 180–914)

## 2017-05-27 MED ORDER — ALUM & MAG HYDROXIDE-SIMETH 200-200-20 MG/5ML PO SUSP
30.0000 mL | Freq: Four times a day (QID) | ORAL | Status: DC | PRN
  Administered 2017-05-27 – 2017-05-30 (×2): 30 mL via ORAL
  Filled 2017-05-27 (×2): qty 30

## 2017-05-27 NOTE — Progress Notes (Signed)
[facility] PROGRESS NOTE  Amanda Garner PXT:[id number] DOB: 22-Nov-1970 DOA: 05/24/2017  PCP: Patient, No Pcp Per  Brief History/Interval Summary: 46 year old African-American female with a past medical history of hypertension, recurrent diverticulitis, obesity, sickle cell trait, recently hospitalized in September for diverticulitis and was discharged on oral Augmentin. She went to her gastroenterologist for follow-up and mentioned persistent symptoms. CT scan was repeated, which showed slight increase in the size of the air-fluid collection adjacent to the sigmoid colon concerning for an enlarging diverticular abscess. Patient was hospitalized for further management.  Reason for Visit: Sigmoid diverticulitis with abscess  Consultants: Gastroenterology  Procedures: None  Antibiotics: Zosyn  Subjective/Interval History: Patient continues to feel better. Pain has significantly improved. Dysuria has also improved. Tolerating her liquid diet.   ROS: Denies any shortness of breath  Objective:  Vital Signs  Vitals:   05/26/17 0525 05/26/17 1328 05/26/17 2209 05/27/17 0634  BP: (!) 136/92 (!) 132/97 134/87 (!) 147/93  Pulse: 85 78 91 85  Resp: 16 16 16 16   Temp: 98.4 F (36.9 C) 98.3 F (36.8 C) 98.6 F (37 C) 98.3 F (36.8 C)  TempSrc: Oral Oral Oral Oral  SpO2: 94% 99% 98% 98%  Weight:      Height:        Intake/Output Summary (Last 24 hours) at 05/27/17 0841 Last data filed at 05/27/17 0600  Gross per 24 hour  Intake             1820 ml  Output                1 ml  Net             1819 ml   Filed Weights   05/24/17 2327  Weight: 94.8 kg (209 lb)    General appearance: Awake, alert. In no distress Resp: Clear to auscultation bilaterally Cardio: His past is normal, regular. No S3, S4. No rubs, murmurs, or bruit. GI: Abdomen is soft. Very minimal tenderness in the left lower quadrant. No masses or organomegaly. Bowel sounds are present.  Extremities:  No edema Neurologic: Awake, alert. Oriented 3. No focal neurological deficits  Lab Results:  Data Reviewed: I have personally reviewed following labs and imaging studies  CBC:  Recent Labs Lab 05/24/17 1045 05/25/17 0539 05/27/17 0501  WBC 8.4 9.2 7.4  NEUTROABS 4.7  --   --   HGB 13.2 11.8* 11.8*  HCT 40.8 35.4* 35.1*  MCV 68.2 Repeated and verified X2.* 66.0* 65.9*  PLT 269.0 216 627    Basic Metabolic Panel:  Recent Labs Lab 05/24/17 1045 05/25/17 0539 05/27/17 0501  NA 140 141 139  K 4.0 3.6 3.6  CL 108 110 108  CO2 25 23 23   GLUCOSE 117* 106* 107*  BUN 10 9 5*  CREATININE 0.73 0.66 0.74  CALCIUM 9.6 8.9 8.8*    GFR: Estimated Creatinine Clearance: 98.9 mL/min (by C-G formula based on SCr of 0.74 mg/dL).   Radiology Studies: No results found.   Medications:  Scheduled: . docusate sodium  100 mg Oral BID  . enoxaparin (LOVENOX) injection  40 mg Subcutaneous Q24H  . nicotine  7 mg Transdermal Daily  . pantoprazole  40 mg Oral Q1200   Continuous: . lactated ringers 100 mL/hr at 05/27/17 0350  . piperacillin-tazobactam (ZOSYN)  IV Stopped (05/27/17 0906)   KXF:[id number] **OR** acetaminophen, hydrALAZINE, morphine injection, ondansetron **OR** ondansetron (ZOFRAN) IV  Assessment/Plan:  Principal Problem:   Abscess of sigmoid colon due  to diverticulitis Active Problems:   Tobacco dependence   Marijuana abuse   Essential hypertension   Dysuria    Sigmoid diverticulitis with abscess. The abscess is inaccessible to percutaneous drainage. Patient seems to be slowly improving. Continue Zosyn. Gastroenterology is following and managing. Previously seen by general surgery and there was no indication for surgical intervention at that time. Previously, there was concern for a colovesical fistula, but none noted in the CT scan done yesterday. Defer management to gastroenterology. Pain control.  Dysuria Patient mentions burning sensation with  urination. UA reviewed. Continue antibiotics for now. Urine culture with 50,000 colonies of Escherichia coli. Sensitivities reviewed. She is currently on Zosyn, as was mentioned above. Patient may have to be seen by urology at some point in time. Her dysuria could be due to the fact that the abscess is close to the urinary bladder.  Essential hypertension. Does not take any medications for the same at home. Blood pressure is reasonably well controlled with occasional high readings. Continue to monitor closely.  Microcytic anemia. Hemoglobin stable for the most part. No evidence of overt bleeding. Ferritin 190. B-12 663, folate 8.8.  Tobacco dependence Cessation was encouraged. Nicotine Patch at discharge.  Marijuana abuse Cessation was encouraged.  DVT Prophylaxis: Lovenox    Code Status: Full code  Family Communication: Discussed with the patient  Disposition Plan: Management as outlined above. Continue to mobilize.    LOS: 3 days   [place] [facility] Pager [phone] 05/27/2017, 8:41 AM  If 7PM-7AM, please contact night-coverage at [email or web address], password [facility]

## 2017-05-27 NOTE — Progress Notes (Signed)
     [facility] Progress Note  Chief Complaint:    diverticulitis  Subjective: No significant abdominal pain, no dysuria. Wants to eat.   Objective:  Vital signs in last 24 hours: Temp:  [98.3 F (36.8 C)-98.6 F (37 C)] 98.3 F (36.8 C) (10/05 0634) Pulse Rate:  [78-91] 85 (10/05 0634) Resp:  [16] 16 (10/05 0634) BP: (132-147)/(87-97) 147/93 (10/05 0634) SpO2:  [98 %-99 %] 98 % (10/05 0634) Last BM Date: 05/24/17 General:   Alert, well-developed, black female in NAD EENT:  Normal hearing, non icteric sclera, conjunctive pink.  Heart:  Regular rate and rhythm; no murmurs. no lower extremity edema Pulm: Normal respiratory effort, lungs CTA bilaterally without wheezes or crackles. Abdomen:  Soft, nondistended, nontender.  Normal bowel sounds, no masses felt. No hepatomegaly.    Neurologic:  Alert and  oriented x4;  grossly normal neurologically. Psych:  Pleasant, cooperative.  Normal mood and affect.   Intake/Output from previous day: 10/04 0701 - 10/05 0700 In: [place] [P.O.:920; I.V.:800; IV Piggyback:100] Out: 1 [Stool:1] Intake/Output this shift: No intake/output data recorded.  Lab Results:  Recent Labs  05/25/17 0539 05/27/17 0501  WBC 9.2 7.4  HGB 11.8* 11.8*  HCT 35.4* 35.1*  PLT 216 208   BMET  Recent Labs  05/25/17 0539 05/27/17 0501  NA 141 139  K 3.6 3.6  CL 110 108  CO2 23 23  GLUCOSE 106* 107*  BUN 9 5*  CREATININE 0.66 0.74  CALCIUM 8.9 8.8*     ASSESSMENT / PLAN:   Sigmoid diverticulitis complicated by abscess (not amenable to drainage). She continues to improve. No significant pain. Afebrile and WBC normal.  -continue Zosyn -Repeat CT scan on Monday, or sooner if regresses -diet advanced to fulls yesterday. She really wants solids. Will try solids  Principal Problem:   Abscess of sigmoid colon due to diverticulitis Active Problems:   Tobacco dependence   Marijuana abuse   Essential hypertension   Dysuria    LOS: 3 days   Tye Savoy ,NP 05/27/2017, 11:11 AM Pager number [phone]     Attending physician's note   I have taken an interval history, reviewed the chart and examined the patient. I agree with the Advanced Practitioner's note, impression and recommendations.   Lucio Edward, MD [facility] [phone] Mon-Fri 8a-5p [phone] after 5p, weekends, holidays

## 2017-05-27 NOTE — Care Management Note (Signed)
Case Management Note  Patient Details  Name: CASI WESTERFELD MRN: [id number] Date of Birth: 1971/04/14  Subjective/Objective: 46 y/o f admitted w/Abscess of sigmoid colon d/t diverticlitis. From home.                  Action/Plan:d/c home.   Expected Discharge Date:                 Expected Discharge Plan:  Home/Self Care  In-House Referral:     Discharge planning Services  CM Consult  Post Acute Care Choice:    Choice offered to:     DME Arranged:    DME Agency:     HH Arranged:    HH Agency:     Status of Service:  In process, will continue to follow  If discussed at Long Length of Stay Meetings, dates discussed:    Additional Comments:  Dessa Phi, RN 05/27/2017, 12:35 PM

## 2017-05-28 DIAGNOSIS — R51 Headache: Secondary | ICD-10-CM

## 2017-05-28 MED ORDER — BUTALBITAL-APAP-CAFFEINE 50-325-40 MG PO TABS
1.0000 | ORAL_TABLET | Freq: Four times a day (QID) | ORAL | Status: DC | PRN
Start: 2017-05-28 — End: 2017-05-31
  Administered 2017-05-28 – 2017-05-30 (×4): 1 via ORAL
  Filled 2017-05-28 (×4): qty 1

## 2017-05-28 MED ORDER — BUTALBITAL-APAP-CAFFEINE 50-325-40 MG PO TABS
2.0000 | ORAL_TABLET | Freq: Once | ORAL | Status: AC
  Administered 2017-05-28: 2 via ORAL
  Filled 2017-05-28: qty 2

## 2017-05-28 NOTE — Progress Notes (Signed)
[facility] PROGRESS NOTE  Amanda Garner GYJ:[id number] DOB: [birth year] DOA: 05/24/2017  PCP: Patient, No Pcp Per  Brief History/Interval Summary: 46 year old African-American female with a past medical history of hypertension, recurrent diverticulitis, obesity, sickle cell trait, recently hospitalized in September for diverticulitis and was discharged on oral Augmentin. She went to her gastroenterologist for follow-up and mentioned persistent symptoms. CT scan was repeated, which showed slight increase in the size of the air-fluid collection adjacent to the sigmoid colon concerning for an enlarging diverticular abscess. Patient was hospitalized for further management.  Reason for Visit: Sigmoid diverticulitis with abscess  Consultants: Gastroenterology  Procedures: None  Antibiotics: Zosyn  Subjective/Interval History: Patient complains of a left-sided headache. Denies any visual disturbances, tingling, numbness, weakness. Abdominal pain is better. No nausea, vomiting. Passing gas. Tolerating her diet.  ROS: Denies any shortness of breath  Objective:  Vital Signs  Vitals:   05/26/17 2209 05/27/17 0634 05/27/17 2306 05/28/17 0545  BP: 134/87 (!) 147/93 (!) 153/101 112/77  Pulse: 91 85 73 81  Resp: 16 16 20 16   Temp: 98.6 F (37 C) 98.3 F (36.8 C) 98.3 F (36.8 C) 97.9 F (36.6 C)  TempSrc: Oral Oral Oral Oral  SpO2: 98% 98% 100% 99%  Weight:      Height:        Intake/Output Summary (Last 24 hours) at 05/28/17 0904 Last data filed at 05/28/17 0551  Gross per 24 hour  Intake          2213.33 ml  Output                0 ml  Net          2213.33 ml   Filed Weights   05/24/17 2327  Weight: 94.8 kg (209 lb)    General appearance: Awake, alert. In no distress. Pupils are equal, reactive. No nystagmus. Resp: Clear to auscultation bilaterally. No wheezing, rales or rhonchi Cardio: S1, S2 is normal, regular. No S3, S4 GI: Abdomen remains soft. No  tenderness. No masses or organomegaly. Bowel sounds are present.  Extremities: No edema Neurologic: Awake, alert. Oriented 3. Cranial nerves II-12 intact. Motor strength equal bilateral upper and lower extremities.  Lab Results:  Data Reviewed: I have personally reviewed following labs and imaging studies  CBC:  Recent Labs Lab 05/24/17 1045 05/25/17 0539 05/27/17 0501  WBC 8.4 9.2 7.4  NEUTROABS 4.7  --   --   HGB 13.2 11.8* 11.8*  HCT 40.8 35.4* 35.1*  MCV 68.2 Repeated and verified X2.* 66.0* 65.9*  PLT 269.0 216 263    Basic Metabolic Panel:  Recent Labs Lab 05/24/17 1045 05/25/17 0539 05/27/17 0501  NA 140 141 139  K 4.0 3.6 3.6  CL 108 110 108  CO2 25 23 23   GLUCOSE 117* 106* 107*  BUN 10 9 5*  CREATININE 0.73 0.66 0.74  CALCIUM 9.6 8.9 8.8*    GFR: Estimated Creatinine Clearance: 98.9 mL/min (by C-G formula based on SCr of 0.74 mg/dL).   Radiology Studies: No results found.   Medications:  Scheduled: . docusate sodium  100 mg Oral BID  . enoxaparin (LOVENOX) injection  40 mg Subcutaneous Q24H  . nicotine  7 mg Transdermal Daily  . pantoprazole  40 mg Oral Q1200   Continuous: . lactated ringers 100 mL/hr at 05/28/17 0243  . piperacillin-tazobactam (ZOSYN)  IV 3.375 g (05/28/17 0532)   ZCH:YIFOYDXAJOINO **OR** acetaminophen, alum & mag hydroxide-simeth, hydrALAZINE, morphine injection, ondansetron **OR** ondansetron (ZOFRAN)  IV  Assessment/Plan:  Principal Problem:   Abscess of sigmoid colon due to diverticulitis Active Problems:   Tobacco dependence   Marijuana abuse   Essential hypertension   Dysuria    Sigmoid diverticulitis with abscess. The abscess is inaccessible to percutaneous drainage. Patient seems to be slowly improving. Continue Zosyn. Gastroenterology is following and managing. Previously seen by general surgery and there was no indication for surgical intervention at that time. Previously, there was concern for a  colovesical fistula, but none noted in the CT scan done yesterday. Repeat CT scan is planned for Monday. Pain is reasonably well controlled.  Dysuria/positive urine culture Patient mentions burning sensation with urination. Urine culture with only 50,000 colonies of Escherichia coli. Sensitivities reviewed. She is currently on Zosyn, as was mentioned above. Patient may have to be seen by urology at some point in time. Her dysuria could be due to the fact that the abscess is close to the urinary bladder. These symptoms have improved.  Essential hypertension. Does not take any medications for the same at home. Blood pressure is reasonably well controlled with occasional high readings. Continue to monitor closely.  Left-sided Headache Patient does not have any focal neurological deficits. No falls or injuries recently. No previous history of migraines. We will give her Fioricet. If the symptoms persist, then she may need further workup.  Microcytic anemia. Hemoglobin stable. No evidence of overt bleeding. Ferritin 190. B-12 663, folate 8.8.  Tobacco dependence Cessation was encouraged. She requests Nicotine Patch at discharge.  Marijuana abuse Cessation was encouraged.  DVT Prophylaxis: Lovenox    Code Status: Full code  Family Communication: Discussed with the patient  Disposition Plan: Continue to mobilize. Continue management as outlined above.    LOS: 4 days   [facility] Pager [phone] 05/28/2017, 9:04 AM  If 7PM-7AM, please contact night-coverage at [email or web address], password [facility]

## 2017-05-28 NOTE — Progress Notes (Signed)
     [facility] Progress Note  Chief Complaint:   Diverticulitis.   Subjective: Feels fine. No abdominal pain, just a headache.   Objective:  Vital signs in last 24 hours: Temp:  [97.9 F (36.6 C)-98.3 F (36.8 C)] 97.9 F (36.6 C) (10/06 0545) Pulse Rate:  [73-81] 81 (10/06 0545) Resp:  [16-20] 16 (10/06 0545) BP: (112-153)/(77-101) 112/77 (10/06 0545) SpO2:  [99 %-100 %] 99 % (10/06 0545) Last BM Date: 05/26/17 General:   Alert, well-developed, black female in NAD EENT:  Normal hearing, non icteric sclera, conjunctive pink.  Heart:  Regular rate and rhythm; no murmurs.no lower extremity edema Pulm: Normal respiratory effort, lungs CTA bilaterally without wheezes or crackles. Abdomen:  Soft, nondistended, nontender.  Normal bowel sounds, no masses felt. No hepatomegaly.    Neurologic:  Alert and  oriented x4;  grossly normal neurologically. Psych:  Pleasant, cooperative.  Normal mood and affect.   Intake/Output from previous day: 10/05 0701 - 10/06 0700 In: 2213.3 [P.O.:840; I.V.:1323.3; IV Piggyback:50] Out: 0  Intake/Output this shift: No intake/output data recorded.  Lab Results:  Recent Labs  05/27/17 0501  WBC 7.4  HGB 11.8*  HCT 35.1*  PLT 208   BMET  Recent Labs  05/27/17 0501  NA 139  K 3.6  CL 108  CO2 23  GLUCOSE 107*  BUN 5*  CREATININE 0.74  CALCIUM 8.8*   No results found.  ASSESSMENT / PLAN:   1. Sigmoid diverticulitis compicated by abscess (not amenable to drainage given location). Continues to improve on Zosyn. Pain free now. Tolerating soft diet.  -Continue IV antibiotics  -Repeat CT scan on Monday  2. HTN, BP remains elevated. She has prn apresoline. Mgmt per primary service.  3. Dysuria, resolved.   Principal Problem:   Abscess of sigmoid colon due to diverticulitis Active Problems:   Tobacco dependence   Marijuana abuse   Essential hypertension   Dysuria    LOS: 4 days   Tye Savoy ,NP  05/28/2017, 11:07 AM  Pager number [phone]    Attending physician's note   I have taken an interval history, reviewed the chart and examined the patient. I agree with the Advanced Practitioner's note, impression and recommendations.   Lucio Edward, MD [facility] [phone] Mon-Fri 8a-5p [phone] after 5p, weekends, holidays

## 2017-05-29 DIAGNOSIS — D509 Iron deficiency anemia, unspecified: Secondary | ICD-10-CM

## 2017-05-29 LAB — CBC
HCT: 35.1 % — ABNORMAL LOW (ref 36.0–46.0)
Hemoglobin: 11.8 g/dL — ABNORMAL LOW (ref 12.0–15.0)
MCH: 22.3 pg — ABNORMAL LOW (ref 26.0–34.0)
MCHC: 33.6 g/dL (ref 30.0–36.0)
MCV: 66.2 fL — ABNORMAL LOW (ref 78.0–100.0)
PLATELETS: 231 10*3/uL (ref 150–400)
RBC: 5.3 MIL/uL — AB (ref 3.87–5.11)
RDW: 14.4 % (ref 11.5–15.5)
WBC: 7.6 10*3/uL (ref 4.0–10.5)

## 2017-05-29 LAB — BASIC METABOLIC PANEL
ANION GAP: 9 (ref 5–15)
BUN: 8 mg/dL (ref 6–20)
CO2: 23 mmol/L (ref 22–32)
Calcium: 9 mg/dL (ref 8.9–10.3)
Chloride: 109 mmol/L (ref 101–111)
Creatinine, Ser: 0.81 mg/dL (ref 0.44–1.00)
GFR calc Af Amer: >60 mL/min (ref >60)
Glucose, Bld: 113 mg/dL — ABNORMAL HIGH (ref 65–99)
POTASSIUM: 3.5 mmol/L (ref 3.5–5.1)
SODIUM: 141 mmol/L (ref 135–145)

## 2017-05-29 MED ORDER — POTASSIUM CHLORIDE CRYS ER 20 MEQ PO TBCR
40.0000 meq | EXTENDED_RELEASE_TABLET | Freq: Once | ORAL | Status: AC
  Administered 2017-05-29: 40 meq via ORAL
  Filled 2017-05-29: qty 2

## 2017-05-29 MED ORDER — AMLODIPINE BESYLATE 5 MG PO TABS
5.0000 mg | ORAL_TABLET | Freq: Every day | ORAL | Status: DC
  Administered 2017-05-29 – 2017-05-31 (×3): 5 mg via ORAL
  Filled 2017-05-29 (×3): qty 1

## 2017-05-29 NOTE — Progress Notes (Signed)
[facility] PROGRESS NOTE  Amanda Garner IRS:[id number] DOB: June 13, 1971 DOA: 05/24/2017  PCP: Patient, No Pcp Per  Brief History/Interval Summary: 46 year old African-American female with a past medical history of hypertension, recurrent diverticulitis, obesity, sickle cell trait, recently hospitalized in September for diverticulitis and was discharged on oral Augmentin. She went to her gastroenterologist for follow-up and mentioned persistent symptoms. CT scan was repeated, which showed slight increase in the size of the air-fluid collection adjacent to the sigmoid colon concerning for an enlarging diverticular abscess. Patient was hospitalized for further management.  Reason for Visit: Sigmoid diverticulitis with abscess  Consultants: Gastroenterology  Procedures: None  Antibiotics: Zosyn  Subjective/Interval History: Patient states that her headache is better. Abdominal pain has resolved. Denies any nausea, vomiting. She is tolerating her diet. Passing gas from below.   ROS: Denies any shortness of breath  Objective:  Vital Signs  Vitals:   05/28/17 1430 05/28/17 2242 05/28/17 2325 05/29/17 0609  BP: (!) 154/99 (!) 161/100 138/79 (!) 153/88  Pulse: 80 82 96 69  Resp: 16 18  16   Temp: 98.3 F (36.8 C) 98.6 F (37 C)  98.1 F (36.7 C)  TempSrc: Oral Oral  Oral  SpO2: 92% 93%  100%  Weight:      Height:        Intake/Output Summary (Last 24 hours) at 05/29/17 0824 Last data filed at 05/28/17 1725  Gross per 24 hour  Intake              600 ml  Output                0 ml  Net              600 ml   Filed Weights   05/24/17 2327  Weight: 94.8 kg (209 lb)    General appearance: Awake, alert. In no distress. Resp: Clear to auscultation bilaterally Cardio: S1, S2 is normal, regular  GI: Abdomen is soft. Nontender, nondistended. Bowel sounds present. No masses or organomegaly Extremities: No edema Neurologic: No focal deficits  Lab Results:  Data  Reviewed: I have personally reviewed following labs and imaging studies  CBC:  Recent Labs Lab 05/24/17 1045 05/25/17 0539 05/27/17 0501 05/29/17 0509  WBC 8.4 9.2 7.4 7.6  NEUTROABS 4.7  --   --   --   HGB 13.2 11.8* 11.8* 11.8*  HCT 40.8 35.4* 35.1* 35.1*  MCV 68.2 Repeated and verified X2.* 66.0* 65.9* 66.2*  PLT 269.0 216 208 009    Basic Metabolic Panel:  Recent Labs Lab 05/24/17 1045 05/25/17 0539 05/27/17 0501 05/29/17 0509  NA 140 141 139 141  K 4.0 3.6 3.6 3.5  CL 108 110 108 109  CO2 25 23 23 23   GLUCOSE 117* 106* 107* 113*  BUN 10 9 5* 8  CREATININE 0.73 0.66 0.74 0.81  CALCIUM 9.6 8.9 8.8* 9.0    GFR: Estimated Creatinine Clearance: 97.7 mL/min (by C-G formula based on SCr of 0.81 mg/dL).   Radiology Studies: No results found.   Medications:  Scheduled: . docusate sodium  100 mg Oral BID  . enoxaparin (LOVENOX) injection  40 mg Subcutaneous Q24H  . nicotine  7 mg Transdermal Daily  . pantoprazole  40 mg Oral Q1200   Continuous: . lactated ringers 100 mL/hr at 05/28/17 2336  . piperacillin-tazobactam (ZOSYN)  IV Stopped (05/29/17 0710)   FGH:WEXHBZJIRCVEL **OR** acetaminophen, alum & mag hydroxide-simeth, [COMPLETED] butalbital-acetaminophen-caffeine **FOLLOWED BY** butalbital-acetaminophen-caffeine, hydrALAZINE, morphine injection, ondansetron **OR**  ondansetron (ZOFRAN) IV  Assessment/Plan:  Principal Problem:   Abscess of sigmoid colon due to diverticulitis Active Problems:   Tobacco dependence   Marijuana abuse   Essential hypertension   Dysuria    Sigmoid diverticulitis with abscess. The abscess was inaccessible to percutaneous drainage. Patient was started on Zosyn. Patient has been improving. Gastroenterology is following and managing. Previously seen by general surgery and there was no indication for surgical intervention at that time. Previously, there was concern for a colovesical fistula, but none noted in the CT scan done  yesterday. Repeat CT scan is planned for Monday. Pain is reasonably well controlled. Continue to mobilize.  Dysuria/positive urine culture Patient mentions burning sensation with urination. Urine culture with only 50,000 colonies of Escherichia coli. Sensitivities reviewed. She is currently on Zosyn, as was mentioned above. Patient may have to be seen by urology at some point in time. Her dysuria could have been due to the fact that the abscess was close to the urinary bladder. These symptoms have resolved.  Essential hypertension. Does not take any medications for the same at home. Blood pressure noted to be elevated consistently. We will initiate amlodipine.   Left-sided Headache Improved with Fioricet. No focal deficits. Continue to monitor for now.   Microcytic anemia. Hemoglobin stable. No overt bleeding. Ferritin 190. B-12 663, folate 8.8.  Tobacco dependence Cessation was encouraged. She requests Nicotine Patch at discharge.  Marijuana abuse Cessation was encouraged.  DVT Prophylaxis: Lovenox    Code Status: Full code  Family Communication: Discussed with the patient  Disposition Plan: Management as outlined above. Mobilize.    LOS: 5 days   [facility] Pager [phone] 05/29/2017, 8:24 AM  If 7PM-7AM, please contact night-coverage at [email or web address], password [facility]

## 2017-05-29 NOTE — Progress Notes (Signed)
    Progress Note   Subjective  No abdominal pain or tenderness. Feels well.    Objective  Vital signs in last 24 hours: Temp:  [98.1 F (36.7 C)-98.6 F (37 C)] 98.1 F (36.7 C) (10/07 0609) Pulse Rate:  [69-96] 69 (10/07 0609) Resp:  [16-18] 16 (10/07 0609) BP: (138-161)/(79-100) 153/88 (10/07 0609) SpO2:  [92 %-100 %] 100 % (10/07 0609) Last BM Date: 05/28/17  General: Alert, well-developed, in NAD Heart:  Regular rate and rhythm; no murmurs Chest: Clear to ascultation bilaterally Abdomen:  Soft, nontender and nondistended. Normal bowel sounds, without guarding, and without rebound.   Extremities:  Without edema. Neurologic:  Alert and  oriented x4; grossly normal neurologically. Psych:  Alert and cooperative. Normal mood and affect.  Intake/Output from previous day: 10/06 0701 - 10/07 0700 In: 600 [P.O.:600] Out: -  Intake/Output this shift: No intake/output data recorded.  Lab Results:  Recent Labs  05/27/17 0501 05/29/17 0509  WBC 7.4 7.6  HGB 11.8* 11.8*  HCT 35.1* 35.1*  PLT 208 231   BMET  Recent Labs  05/27/17 0501 05/29/17 0509  NA 139 141  K 3.6 3.5  CL 108 109  CO2 23 23  GLUCOSE 107* 113*  BUN 5* 8  CREATININE 0.74 0.81  CALCIUM 8.8* 9.0     Assessment & Plan   1. Sigmoid diverticulitis with abscess. No abdominal pain or tenderness. Clinically improved on Zosyn. Tolerating a soft diet. Abd/pelvic CT tomorrow.   2. Microcytic anemia. Sickle cell trait. Fe, TIBC, B12, folate unremarkable.   3. HTN. Per primary service.     LOS: 5 days   Arvle Grabe T. Fuller Plan MD 05/29/2017, 10:25 AM

## 2017-05-30 ENCOUNTER — Inpatient Hospital Stay (HOSPITAL_COMMUNITY): Payer: BLUE CROSS/BLUE SHIELD

## 2017-05-30 ENCOUNTER — Other Ambulatory Visit: Payer: Self-pay

## 2017-05-30 MED ORDER — IOPAMIDOL (ISOVUE-300) INJECTION 61%
INTRAVENOUS | Status: AC
  Administered 2017-05-30: 08:00:00
  Filled 2017-05-30: qty 30

## 2017-05-30 MED ORDER — IOPAMIDOL (ISOVUE-300) INJECTION 61%
100.0000 mL | Freq: Once | INTRAVENOUS | Status: AC | PRN
  Administered 2017-05-30: 10:00:00 100 mL via INTRAVENOUS

## 2017-05-30 MED ORDER — IOPAMIDOL (ISOVUE-300) INJECTION 61%
INTRAVENOUS | Status: AC
  Filled 2017-05-30: qty 100

## 2017-05-30 MED ORDER — IOPAMIDOL (ISOVUE-300) INJECTION 61%: 15.0000 mL | Freq: Once | INTRAVENOUS | Status: DC | PRN

## 2017-05-30 NOTE — Consult Note (Signed)
[facility] Consult/Admission Note  Amanda Garner 12-11-1970  [id number].    Requesting MD: Dr. Silverio Decamp Chief Complaint/Reason for Consult: diverticular abscess  HPI:   Amanda Garner is a 46 y.o. female smoker with medical history significant of HTN, colitis/diverticuluitis; obesity; and sickle cell trait who we are asked to see for her diverticulitis. Pt was hospitalized on 09/19 and discharged on 09/21 for diverticulitis with abscess on Augmentin. She went to see GI on 10/02 for continued pain and discomfort with urination. A CT was done on 10/02 and showed slight increase in the size of the air-fluid collection adjacent to the sigmoid colon consistent with an enlarging diverticular abscess. She was admitted on 10/02 and has been on IV Zosyn since admission. Repeat CT scan today (10/08) showed persistent sigmoid colon diverticulitis with unchanged diverticular abscess. IR is unable to drain. Pt is afebrile with normal WBC. Pt was found to have E coli in urine. Urinary symptoms have resolved.   Currently pt has no complaints. No abdominal pain, fevers, nausea, vomiting, urinary symptoms.   ROS:  Review of Systems  Constitutional: Negative for chills, diaphoresis and fever.  Respiratory: Negative for shortness of breath.   Cardiovascular: Negative for chest pain.  Gastrointestinal: Negative for abdominal pain, blood in stool, constipation, diarrhea, nausea and vomiting.  Genitourinary: Negative for dysuria and hematuria.  Skin: Negative for rash.  Neurological: Negative for dizziness and loss of consciousness.  All other systems reviewed and are negative.    Family History  Problem Relation Age of Onset  . Diabetes type II Mother   . Diabetes type II Father   . Stroke Father   . Lung cancer Father 76  . Colon cancer Neg Hx     Past Medical History:  Diagnosis Date  . Anemia Dx: 2001 or so   . Blood transfusion   . Crohn disease ([place])   .  Diverticulitis   . Hypertension   . Sickle cell trait [facility])     Past Surgical History:  Procedure Laterality Date  . ABDOMINAL HYSTERECTOMY  2001   partial hysterectomy - emergency during last C-section  . [place]; 1997; 2001    Social History:  reports that she has been smoking Cigarettes.  She has a 9.30 pack-year smoking history. She has never used smokeless tobacco. She reports that she drinks alcohol. She reports that she does not use drugs.  Allergies: No Known Allergies  Medications Prior to Admission  Medication Sig Dispense Refill  . amoxicillin-clavulanate (AUGMENTIN) 875-125 MG tablet Take 1 tab twice daily for 14 days. 28 tablet 0  . oxyCODONE-acetaminophen (ROXICET) 5-325 MG tablet Take 1 tablet by mouth every 6 (six) hours as needed for severe pain. 40 tablet 0    Blood pressure 124/74, pulse 72, temperature 99.1 F (37.3 C), temperature source Oral, resp. rate 16, height 5' 5"  (1.651 m), weight 209 lb (94.8 kg), last menstrual period 08/20/2011, SpO2 99 %.  Physical Exam  Constitutional: She is oriented to person, place, and time and well-developed, well-nourished, and in no distress. Vital signs are normal. No distress.  HENT:  Head: Normocephalic and atraumatic.  Nose: Nose normal.  Mouth/Throat: Oropharynx is clear and moist. No oropharyngeal exudate.  Eyes: Pupils are equal, round, and reactive to light. Conjunctivae are normal. Right eye exhibits no discharge. Left eye exhibits no discharge. No scleral icterus.  Neck: Normal range of motion. Neck supple. No tracheal deviation present. No thyromegaly present.  Cardiovascular: Normal rate,  regular rhythm, normal heart sounds and intact distal pulses.  Exam reveals no gallop and no friction rub.   No murmur heard. Pulses:      Radial pulses are 2+ on the right side, and 2+ on the left side.       Dorsalis pedis pulses are 2+ on the right side, and 2+ on the left side.  Pulmonary/Chest: Effort  normal and breath sounds normal. No respiratory distress. She has no decreased breath sounds. She has no wheezes. She has no rhonchi. She has no rales.  Abdominal: Soft. Bowel sounds are normal. She exhibits no distension and no mass. There is no tenderness. There is no rebound and no guarding.  Musculoskeletal: Normal range of motion. She exhibits no edema or deformity.  Neurological: She is alert and oriented to person, place, and time.  Skin: Skin is warm and dry. No rash noted. She is not diaphoretic.  Psychiatric: Mood and affect normal.  Nursing note and vitals reviewed.   Results for orders placed or performed during the hospital encounter of 05/24/17 (from the past 48 hour(s))  CBC     Status: Abnormal   Collection Time: 05/29/17  5:09 AM  Result Value Ref Range   WBC 7.6 4.0 - 10.5 K/uL   RBC 5.30 (H) 3.87 - 5.11 MIL/uL   Hemoglobin 11.8 (L) 12.0 - 15.0 g/dL   HCT 35.1 (L) 36.0 - 46.0 %   MCV 66.2 (L) 78.0 - 100.0 fL   MCH 22.3 (L) 26.0 - 34.0 pg   MCHC 33.6 30.0 - 36.0 g/dL   RDW 14.4 11.5 - 15.5 %   Platelets 231 150 - 400 K/uL  Basic metabolic panel     Status: Abnormal   Collection Time: 05/29/17  5:09 AM  Result Value Ref Range   Sodium 141 135 - 145 mmol/L   Potassium 3.5 3.5 - 5.1 mmol/L   Chloride 109 101 - 111 mmol/L   CO2 23 22 - 32 mmol/L   Glucose, Bld 113 (H) 65 - 99 mg/dL   BUN 8 6 - 20 mg/dL   Creatinine, Ser 0.81 0.44 - 1.00 mg/dL   Calcium 9.0 8.9 - 10.3 mg/dL   GFR calc non Af Amer >60 >60 mL/min   GFR calc Af Amer >60 >60 mL/min    Comment: (NOTE) The eGFR has been calculated using the CKD EPI equation. This calculation has not been validated in all clinical situations. eGFR's persistently <60 mL/min signify possible Chronic Kidney Disease.    Anion gap 9 5 - 15   Ct Abdomen Pelvis W Contrast  Result Date: 05/30/2017 CLINICAL DATA:  Diverticulitis.  Abscess follow-up. EXAM: CT ABDOMEN AND PELVIS WITH CONTRAST TECHNIQUE: Multidetector CT imaging  of the abdomen and pelvis was performed using the standard protocol following bolus administration of intravenous contrast. CONTRAST:  121m ISOVUE-300 IOPAMIDOL (ISOVUE-300) INJECTION 61% COMPARISON:  05/24/2017 FINDINGS: Lower chest: Minimal atelectasis in the lung bases. No pleural effusion. Hepatobiliary: No focal liver abnormality is identified. Unremarkable gallbladder. No biliary dilatation. Pancreas: Unremarkable. Spleen: Unremarkable. Adrenals/Urinary Tract: Unremarkable adrenal glands. No evidence of renal mass, calculi, or hydronephrosis. Stomach/Bowel: The stomach is within normal limits. There is no evidence of bowel obstruction. Colonic diverticulosis is again noted with persistent sigmoid colon wall thickening and surrounding inflammation consistent with diverticulitis. Gas and fluid collection extending inferiorly from the sigmoid colon has not significantly changed in size upon remeasurement, currently 2.9 x 2.5 x 2.7 cm (series 2, image 67 and series 5,  image 57). This collection again extends to the superior aspect of the bladder and is inseparable from the anterior aspect of the vagina. No gas is present in the bladder to suggest a fistula. The tip of the appendix abuts the inflamed segment of sigmoid colon. There is gas throughout the appendix without evidence of acute appendicitis. Vascular/Lymphatic: No significant vascular findings are evident. Stable to minimally increased size of a 1.2 cm short axis left external iliac lymph node, likely reactive. Subcentimeter bilateral iliac and mesenteric lymph nodes are stable to slightly increased. Reproductive: Prior hysterectomy. Asymmetric prominence of the left ovary is unchanged. Other: No significant intraperitoneal free fluid. No abdominal wall mass or hernia. Musculoskeletal: Mild lumbar levoscoliosis. IMPRESSION: 1. Persistent sigmoid colon diverticulitis with unchanged diverticular abscess. 2. No new abnormality identified in the abdomen or  pelvis. Electronically Signed   By: Logan Bores M.D.   On: 05/30/2017 10:11      Assessment/Plan  Sigmoid diverticulitis with abscess - IR unable to drain - pt currently asymptomatic - encouraged smoking cessation   Since pt is asymptomatic, would recommend discharge with PO antibiotics and f/u in our office to discuss elective partial colectomy when abscess resolves. Would recommend colonoscopy prior to f/u in our office. Pt does not need emergent surgery.   Thank you for the consult and we will follow.   Kalman Drape, [facility] 05/30/2017, 1:18 PM Pager: [phone] Consults: [phone] Mon-Fri 7:00 am-4:30 pm Sat-Sun 7:00 am-11:30 am

## 2017-05-30 NOTE — Progress Notes (Signed)
Pt has court tomorrow but will not be able to go, so she asked about having something faxed to her lawyer's office saying when she came, why she is here and how long she will be here. Messaged relayed to SW and they stated they would have something faxed over to the lawyer. Pt also asked to speak with SW. Messaged relayed to [place] in [place].

## 2017-05-30 NOTE — Progress Notes (Signed)
[facility] PROGRESS NOTE  Amanda Garner FHQ:[id number] DOB: 06-02-1971 DOA: 05/24/2017  PCP: Patient, No Pcp Per  Brief History/Interval Summary: 46 year old African-American female with a past medical history of hypertension, recurrent diverticulitis, obesity, sickle cell trait, recently hospitalized in September for diverticulitis and was discharged on oral Augmentin. She went to her gastroenterologist for follow-up and mentioned persistent symptoms. CT scan was repeated, which showed slight increase in the size of the air-fluid collection adjacent to the sigmoid colon concerning for an enlarging diverticular abscess. Patient was hospitalized for further management. Patient was seen by gastroenterology. She was placed on Zosyn. She has improved significantly. CT scan to be repeated today.  Reason for Visit: Sigmoid diverticulitis with abscess  Consultants: Gastroenterology  Procedures: None  Antibiotics: Zosyn  Subjective/Interval History: Patient continues to feel well. Abdominal pain is very minimal, if at all. Denies any dysuria whatsoever. Headache is much improved.   ROS: Denies any shortness of breath  Objective:  Vital Signs  Vitals:   05/29/17 0609 05/29/17 1418 05/29/17 2200 05/30/17 0617  BP: (!) 153/88 (!) 132/96 132/76 124/74  Pulse: 69 81 88 72  Resp: 16 16 18 16   Temp: 98.1 F (36.7 C) 98.4 F (36.9 C) 98.2 F (36.8 C) 99.1 F (37.3 C)  TempSrc: Oral Oral Oral Oral  SpO2: 100% 100% 99% 99%  Weight:      Height:        Intake/Output Summary (Last 24 hours) at 05/30/17 0833 Last data filed at 05/30/17 4982  Gross per 24 hour  Intake          4939.83 ml  Output                0 ml  Net          4939.83 ml   Filed Weights   05/24/17 2327  Weight: 94.8 kg (209 lb)    General appearance: Awake, alert. In no distress Resp: Clear to auscultation bilaterally. Normal effort Cardio: S1, S2 is normal, regular. No S3, S4. No rubs, murmurs or  bruits GI: Abdomen remains soft. Nontender, nondistended. Bowel sounds are present. No masses or organomegaly Extremities: No edema Neurologic: No focal deficits  Lab Results:  Data Reviewed: I have personally reviewed following labs and imaging studies  CBC:  Recent Labs Lab 05/24/17 1045 05/25/17 0539 05/27/17 0501 05/29/17 0509  WBC 8.4 9.2 7.4 7.6  NEUTROABS 4.7  --   --   --   HGB 13.2 11.8* 11.8* 11.8*  HCT 40.8 35.4* 35.1* 35.1*  MCV 68.2 Repeated and verified X2.* 66.0* 65.9* 66.2*  PLT 269.0 216 208 641    Basic Metabolic Panel:  Recent Labs Lab 05/24/17 1045 05/25/17 0539 05/27/17 0501 05/29/17 0509  NA 140 141 139 141  K 4.0 3.6 3.6 3.5  CL 108 110 108 109  CO2 25 23 23 23   GLUCOSE 117* 106* 107* 113*  BUN 10 9 5* 8  CREATININE 0.73 0.66 0.74 0.81  CALCIUM 9.6 8.9 8.8* 9.0    GFR: Estimated Creatinine Clearance: 97.7 mL/min (by C-G formula based on SCr of 0.81 mg/dL).   Radiology Studies: No results found.   Medications:  Scheduled: . amLODipine  5 mg Oral Daily  . docusate sodium  100 mg Oral BID  . enoxaparin (LOVENOX) injection  40 mg Subcutaneous Q24H  . iopamidol      . nicotine  7 mg Transdermal Daily  . pantoprazole  40 mg Oral Q1200   Continuous: .  lactated ringers 30 mL/hr at 05/29/17 1003  . piperacillin-tazobactam (ZOSYN)  IV 3.375 g (05/30/17 0534)   IRJ:JOACZYSAYTKZS **OR** acetaminophen, alum & mag hydroxide-simeth, [COMPLETED] butalbital-acetaminophen-caffeine **FOLLOWED BY** butalbital-acetaminophen-caffeine, hydrALAZINE, iopamidol, morphine injection, ondansetron **OR** ondansetron (ZOFRAN) IV  Assessment/Plan:  Principal Problem:   Abscess of sigmoid colon due to diverticulitis Active Problems:   Tobacco dependence   Marijuana abuse   Essential hypertension   Dysuria    Sigmoid diverticulitis with abscess. The abscess was inaccessible to percutaneous drainage. Patient was started on Zosyn. Patient has been  improving. Gastroenterology is following and managing. Previously seen by general surgery and there was no indication for surgical intervention at that time. Previously, there was concern for a colovesical fistula, but none noted in the CT scan done at admission. CT scan to be repeated today. Pain is well controlled. Continue to mobilize.   Dysuria/positive urine culture Patient mentioned burning sensation with urination. Urine culture with only 50,000 colonies of Escherichia coli. Sensitivities reviewed. She is currently on Zosyn, as was mentioned above. Patient may have to be seen by urology at some point in time. Her dysuria could have been due to the fact that the abscess was close to the urinary bladder. These symptoms have resolved.  Essential hypertension. Does not take any medications for the same at home. Blood pressure was noted to be elevated consistently. Amlodipine was initiated. Blood pressure is much better controlled now.   Left-sided Headache Improved with Fioricet. No focal deficits. Continue to monitor for now.   Microcytic anemia. Hemoglobin stable. No overt bleeding. Ferritin 190. B-12 663, folate 8.8.  Tobacco dependence Cessation was encouraged. She requests Nicotine Patch at discharge.  Marijuana abuse Cessation was encouraged.  DVT Prophylaxis: Lovenox    Code Status: Full code  Family Communication: Discussed with the patient  Disposition Plan: Management as outlined above. Mobilize. Await repeat CT scan.    LOS: 6 days   [facility] Pager [phone] 05/30/2017, 8:33 AM  If 7PM-7AM, please contact night-coverage at [email or web address], password [facility]

## 2017-05-30 NOTE — Progress Notes (Signed)
LCSW received call from RN requesting note to be sent to attorney due to patient remaining in the hospital. Letter completed and faxed.   Will follow up with patient regarding her request.  Lane Hacker, MSW Clinical Social Work: System [employer] Coverage for :  [phone]

## 2017-05-30 NOTE — Progress Notes (Signed)
GI has contacted Dr. Vernard Gambles regarding this patient with sigmoid diverticulitis with a small unchanged abscess.  After reviewing the images, this collection is found to not be amendable to percutaneous drainage as it is too small in nature to drain.  I have contacted [place], [place] with GI to relay this information.  Arbor Leer E 11:23 AM 05/30/2017

## 2017-05-30 NOTE — Progress Notes (Signed)
Progress Note   Subjective  Chief Complaint: Sigmoid diverticulitis with abscess  Pt doing very well this morning, no pain, normal bowel movements. She tells me that she is supposed to be in court tomorrow and will need some paperwork filled out if she is unable to be there. No new complaints.   Objective   Vital signs in last 24 hours: Temp:  [98.2 F (36.8 C)-99.1 F (37.3 C)] 99.1 F (37.3 C) (10/08 0617) Pulse Rate:  [72-88] 72 (10/08 0617) Resp:  [16-18] 16 (10/08 0617) BP: (124-132)/(74-96) 124/74 (10/08 0617) SpO2:  [99 %-100 %] 99 % (10/08 0617) Last BM Date: 05/30/17 General:    AA female in NAD Heart:  Regular rate and rhythm; no murmurs Lungs: Respirations even and unlabored, lungs CTA bilaterally Abdomen:  Soft, nontender and nondistended. Normal bowel sounds. Extremities:  Without edema. Neurologic:  Alert and oriented,  grossly normal neurologically. Psych:  Cooperative. Normal mood and affect.  Intake/Output from previous day: 10/07 0701 - 10/08 0700 In: 4939.8 [P.O.:1260; I.V.:3329.8; IV Piggyback:350] Out: -   Lab Results:  Recent Labs  05/29/17 0509  WBC 7.6  HGB 11.8*  HCT 35.1*  PLT 231   BMET  Recent Labs  05/29/17 0509  NA 141  K 3.5  CL 109  CO2 23  GLUCOSE 113*  BUN 8  CREATININE 0.81  CALCIUM 9.0    Studies/Results: Ct Abdomen Pelvis W Contrast  Result Date: 05/30/2017 CLINICAL DATA:  Diverticulitis.  Abscess follow-up. EXAM: CT ABDOMEN AND PELVIS WITH CONTRAST TECHNIQUE: Multidetector CT imaging of the abdomen and pelvis was performed using the standard protocol following bolus administration of intravenous contrast. CONTRAST:  142m ISOVUE-300 IOPAMIDOL (ISOVUE-300) INJECTION 61% COMPARISON:  05/24/2017 FINDINGS: Lower chest: Minimal atelectasis in the lung bases. No pleural effusion. Hepatobiliary: No focal liver abnormality is identified. Unremarkable gallbladder. No biliary dilatation. Pancreas: Unremarkable. Spleen:  Unremarkable. Adrenals/Urinary Tract: Unremarkable adrenal glands. No evidence of renal mass, calculi, or hydronephrosis. Stomach/Bowel: The stomach is within normal limits. There is no evidence of bowel obstruction. Colonic diverticulosis is again noted with persistent sigmoid colon wall thickening and surrounding inflammation consistent with diverticulitis. Gas and fluid collection extending inferiorly from the sigmoid colon has not significantly changed in size upon remeasurement, currently 2.9 x 2.5 x 2.7 cm (series 2, image 67 and series 5, image 57). This collection again extends to the superior aspect of the bladder and is inseparable from the anterior aspect of the vagina. No gas is present in the bladder to suggest a fistula. The tip of the appendix abuts the inflamed segment of sigmoid colon. There is gas throughout the appendix without evidence of acute appendicitis. Vascular/Lymphatic: No significant vascular findings are evident. Stable to minimally increased size of a 1.2 cm short axis left external iliac lymph node, likely reactive. Subcentimeter bilateral iliac and mesenteric lymph nodes are stable to slightly increased. Reproductive: Prior hysterectomy. Asymmetric prominence of the left ovary is unchanged. Other: No significant intraperitoneal free fluid. No abdominal wall mass or hernia. Musculoskeletal: Mild lumbar levoscoliosis. IMPRESSION: 1. Persistent sigmoid colon diverticulitis with unchanged diverticular abscess. 2. No new abnormality identified in the abdomen or pelvis. Electronically Signed   By: ALogan BoresM.D.   On: 05/30/2017 10:11       Assessment / Plan:   Assessment: 1. Sigmoid diverticulitis with abscess: No abdominal pain, no ttp-clinically improved on Zosyn but Ct today shows persistent abscess with no change 2. Microcytic anemia: Sickle cell trait-labs unremarkable  Plan:  1. Contacted IR who will take a look at patient's images to see if her abscess would be  amenable to drainage- will await their recommendations 2. Continue current therapy 3. Please await any further recs from Dr. Silverio Decamp later today  Thank you for your kind consultation, we will continue to follow.    LOS: 6 days   Levin Erp  05/30/2017, 10:51 AM  Pager # [phone]   Attending physician's note   I have taken an interval history, reviewed the chart and examined the patient. I agree with the Advanced Practitioner's note, impression and recommendations.  Clinically improved with no abdominal pain, tolerating soft diet Patient with history of recurrent sigmoid diverticulitis (initially noted on CT abd & pelvis 2014) admitted with complicated sigmoid diverticulitis with abscess despite prolonged course of antibiotics. Repeat CT abdomen and pelvis with contrast showed persistent abscess [id number] cm. Discussed with IR about possible percutaneous drainage, Dr Loyce Dys felt it is not amenable given its so small Continue IV Zosyn and supportive care [facility] request surgical consult  Damaris Hippo, MD [phone] Mon-Fri 8a-5p [phone] after 5p, weekends, holidays

## 2017-05-31 LAB — BASIC METABOLIC PANEL
Anion gap: 10 (ref 5–15)
BUN: 12 mg/dL (ref 6–20)
CHLORIDE: 108 mmol/L (ref 101–111)
CO2: 21 mmol/L — ABNORMAL LOW (ref 22–32)
CREATININE: 0.78 mg/dL (ref 0.44–1.00)
Calcium: 9.1 mg/dL (ref 8.9–10.3)
Glucose, Bld: 127 mg/dL — ABNORMAL HIGH (ref 65–99)
Potassium: 3.7 mmol/L (ref 3.5–5.1)
SODIUM: 139 mmol/L (ref 135–145)

## 2017-05-31 LAB — CBC
HCT: 36 % (ref 36.0–46.0)
HEMOGLOBIN: 12.1 g/dL (ref 12.0–15.0)
MCH: 22.4 pg — ABNORMAL LOW (ref 26.0–34.0)
MCHC: 33.6 g/dL (ref 30.0–36.0)
MCV: 66.7 fL — ABNORMAL LOW (ref 78.0–100.0)
PLATELETS: 251 10*3/uL (ref 150–400)
RBC: 5.4 MIL/uL — AB (ref 3.87–5.11)
RDW: 14.5 % (ref 11.5–15.5)
WBC: 7.1 10*3/uL (ref 4.0–10.5)

## 2017-05-31 MED ORDER — NICOTINE 14 MG/24HR TD PT24: 14.0000 mg | MEDICATED_PATCH | Freq: Every day | TRANSDERMAL | 1 refills | Status: DC

## 2017-05-31 MED ORDER — AMLODIPINE BESYLATE 5 MG PO TABS: 5.0000 mg | ORAL_TABLET | Freq: Every day | ORAL | 0 refills | Status: DC

## 2017-05-31 MED ORDER — PANTOPRAZOLE SODIUM 40 MG PO TBEC: 40.0000 mg | DELAYED_RELEASE_TABLET | Freq: Every day | ORAL | 0 refills | Status: DC

## 2017-05-31 MED ORDER — BUTALBITAL-APAP-CAFFEINE 50-325-40 MG PO TABS: 1.0000 | ORAL_TABLET | Freq: Four times a day (QID) | ORAL | 0 refills | Status: DC | PRN

## 2017-05-31 MED ORDER — DOCUSATE SODIUM 100 MG PO CAPS
100.0000 mg | ORAL_CAPSULE | Freq: Two times a day (BID) | ORAL | 0 refills | Status: DC
Start: 2017-05-31 — End: 2017-06-07

## 2017-05-31 NOTE — Final Consult Note (Signed)
Consultant Final Sign-Off Note    Assessment/Final recommendations  Amanda Garner is a 46 y.o. female followed by me for diverticulitis with abscess   Wound care (if applicable):    Diet at discharge: soft diet   Activity at discharge: per primary team   Follow-up appointment:  Call [facility] to schedule an appointment with one of our colorectal specialist. Dr. Leighton Ruff, Dr. Dema Severin or Dr. Johney Maine.   Pending results:  [employer]     Ordered   05/31/17 0500  Creatinine, serum  (enoxaparin (LOVENOX)    CrCl >/= 30 ml/min)  Weekly,   R    Comments:  while on enoxaparin therapy    05/24/17 2014       Medication recommendations: 2 week of Augmentin   Other recommendations: Encouraged smoking cessation    Thank you for allowing [place] to participate in the care of your patient!  Please consult [place] again if you have further needs for your patient.  Kalman Drape 05/31/2017 7:58 AM    Subjective   Currently pt has no complaints. No abdominal pain, fevers, nausea, vomiting, urinary symptoms.  Objective  Vital signs in last 24 hours: Temp:  [97.9 F (36.6 C)-98.5 F (36.9 C)] 98.5 F (36.9 C) (10/09 0629) Pulse Rate:  [60-80] 60 (10/09 0629) Resp:  [18] 18 (10/09 0629) BP: (120-146)/(54-97) 131/54 (10/09 0629) SpO2:  [95 %-100 %] 95 % (10/09 0629)  PE Constitutional: She is well-developed, well-nourished, and in no distress. Vital signs are normal.  Cardiovascular: Normal rate, regular rhythm, no gallop and no friction rub.  No murmur heard. Pulmonary/Chest: Effort normal and no respiratory distress.  Abdominal: Soft. Bowel sounds are normal. She exhibits no distension and no mass. There is no tenderness. There is no rebound and no guarding. No hernias appreciated Neurological: She is alert and oriented to person, place, and time.  Skin: Skin is warm and dry. No rash noted. She is not diaphoretic.  Psychiatric: Mood and affect normal.  Nursing note and  vitals reviewed.   Pertinent labs and Studies:  Recent Labs  05/29/17 0509 05/31/17 0611  WBC 7.6 7.1  HGB 11.8* 12.1  HCT 35.1* 36.0   BMET  Recent Labs  05/29/17 0509 05/31/17 0611  NA 141 139  K 3.5 3.7  CL 109 108  CO2 23 21*  GLUCOSE 113* 127*  BUN 8 12  CREATININE 0.81 0.78  CALCIUM 9.0 9.1   No results for input(s): LABURIN in the last 72 hours. Results for orders placed or performed during the hospital encounter of 05/24/17  Culture, Urine     Status: Abnormal   Collection Time: 05/24/17  8:21 PM  Result Value Ref Range Status   Specimen Description URINE, CLEAN CATCH  Final   Special Requests NONE  Final   Culture 50,000 COLONIES/mL ESCHERICHIA COLI (A)  Final   Report Status 05/27/2017 FINAL  Final   Organism ID, Bacteria ESCHERICHIA COLI (A)  Final      Susceptibility   Escherichia coli - MIC*    AMPICILLIN >=32 RESISTANT Resistant     CEFAZOLIN 16 SENSITIVE Sensitive     CEFTRIAXONE <=1 SENSITIVE Sensitive     CIPROFLOXACIN <=0.25 SENSITIVE Sensitive     GENTAMICIN <=1 SENSITIVE Sensitive     IMIPENEM <=0.25 SENSITIVE Sensitive     NITROFURANTOIN <=16 SENSITIVE Sensitive     TRIMETH/SULFA <=20 SENSITIVE Sensitive     AMPICILLIN/SULBACTAM >=32 RESISTANT Resistant  PIP/TAZO <=4 SENSITIVE Sensitive     Extended ESBL NEGATIVE Sensitive     * 50,000 COLONIES/mL ESCHERICHIA COLI    Imaging: Ct Abdomen Pelvis W Contrast  Result Date: 05/30/2017 CLINICAL DATA:  Diverticulitis.  Abscess follow-up. EXAM: CT ABDOMEN AND PELVIS WITH CONTRAST TECHNIQUE: Multidetector CT imaging of the abdomen and pelvis was performed using the standard protocol following bolus administration of intravenous contrast. CONTRAST:  134m ISOVUE-300 IOPAMIDOL (ISOVUE-300) INJECTION 61% COMPARISON:  05/24/2017 FINDINGS: Lower chest: Minimal atelectasis in the lung bases. No pleural effusion. Hepatobiliary: No focal liver abnormality is identified. Unremarkable gallbladder. No  biliary dilatation. Pancreas: Unremarkable. Spleen: Unremarkable. Adrenals/Urinary Tract: Unremarkable adrenal glands. No evidence of renal mass, calculi, or hydronephrosis. Stomach/Bowel: The stomach is within normal limits. There is no evidence of bowel obstruction. Colonic diverticulosis is again noted with persistent sigmoid colon wall thickening and surrounding inflammation consistent with diverticulitis. Gas and fluid collection extending inferiorly from the sigmoid colon has not significantly changed in size upon remeasurement, currently 2.9 x 2.5 x 2.7 cm (series 2, image 67 and series 5, image 57). This collection again extends to the superior aspect of the bladder and is inseparable from the anterior aspect of the vagina. No gas is present in the bladder to suggest a fistula. The tip of the appendix abuts the inflamed segment of sigmoid colon. There is gas throughout the appendix without evidence of acute appendicitis. Vascular/Lymphatic: No significant vascular findings are evident. Stable to minimally increased size of a 1.2 cm short axis left external iliac lymph node, likely reactive. Subcentimeter bilateral iliac and mesenteric lymph nodes are stable to slightly increased. Reproductive: Prior hysterectomy. Asymmetric prominence of the left ovary is unchanged. Other: No significant intraperitoneal free fluid. No abdominal wall mass or hernia. Musculoskeletal: Mild lumbar levoscoliosis. IMPRESSION: 1. Persistent sigmoid colon diverticulitis with unchanged diverticular abscess. 2. No new abnormality identified in the abdomen or pelvis. Electronically Signed   By: ALogan BoresM.D.   On: 05/30/2017 10:11     JJackson Latino PT[facility] Pager [phone]

## 2017-05-31 NOTE — Discharge Instructions (Signed)
Diverticulitis Diverticulitis is when small pockets in your large intestine (colon) get infected or swollen. This causes stomach pain and watery poop (diarrhea). These pouches are called diverticula. They form in people who have a condition called diverticulosis. Follow these instructions at home: Medicines  Take over-the-counter and prescription medicines only as told by your doctor. These include: ? Antibiotics. ? Pain medicines. ? Fiber pills. ? Probiotics. ? Stool softeners.  Do not drive or use heavy machinery while taking prescription pain medicine.  If you were prescribed an antibiotic, take it as told. Do not stop taking it even if you feel better. General instructions  Follow a diet as told by your doctor.  When you feel better, your doctor may tell you to change your diet. You may need to eat a lot of fiber. Fiber makes it easier to poop (have bowel movements). Healthy foods with fiber include: ? Berries. ? Beans. ? Lentils. ? Green vegetables.  Exercise 3 or more times a week. Aim for 30 minutes each time. Exercise enough to sweat and make your heart beat faster.  Keep all follow-up visits as told. This is important. You may need to have an exam of the large intestine. This is called a colonoscopy. Contact a doctor if:  Your pain does not get better.  You have a hard time eating or drinking.  You are not pooping like normal. Get help right away if:  Your pain gets worse.  Your problems do not get better.  Your problems get worse very fast.  You have a fever.  You throw up (vomit) more than one time.  You have poop that is: ? Bloody. ? Black. ? Tarry. Summary  Diverticulitis is when small pockets in your large intestine (colon) get infected or swollen.  Take medicines only as told by your doctor.  Follow a diet as told by your doctor. This information is not intended to replace advice given to you by your health care provider. Make sure you discuss  any questions you have with your health care provider. Document Released: 01/26/2008 Document Revised: 08/26/2016 Document Reviewed: 08/26/2016 Elsevier Interactive Patient Education  2017 [employer].

## 2017-05-31 NOTE — Progress Notes (Signed)
[facility] Progress Note  CC:  Diverticulitis with abscess  Subjective:  Feels good.  No pain.  Just had a BM.  Ready to go home.  Objective:  Vital signs in last 24 hours: Temp:  [97.9 F (36.6 C)-98.5 F (36.9 C)] 98.5 F (36.9 C) (10/09 0629) Pulse Rate:  [60-80] 60 (10/09 0629) Resp:  [18] 18 (10/09 0629) BP: (120-146)/(54-97) 128/90 (10/09 0827) SpO2:  [95 %-100 %] 95 % (10/09 0629) Last BM Date: 05/30/17 General:  Alert, Well-developed, in NAD Heart:  Regular rate and rhythm; no murmurs Pulm:  CTAB.  No increased WOB. Abdomen:  Soft, non-distended.  BS present.  Non-tender. Extremities:  Without edema. Neurologic:  Alert and oriented x 4;  grossly normal neurologically. Psych:  Alert and cooperative. Normal mood and affect.  Intake/Output from previous day: 10/08 0701 - 10/09 0700 In: 2569 [P.O.:1800; I.V.:719; IV Piggyback:50] Out: -   Lab Results:  Recent Labs  05/29/17 0509 05/31/17 0611  WBC 7.6 7.1  HGB 11.8* 12.1  HCT 35.1* 36.0  PLT 231 251   BMET  Recent Labs  05/29/17 0509 05/31/17 0611  NA 141 139  K 3.5 3.7  CL 109 108  CO2 23 21*  GLUCOSE 113* 127*  BUN 8 12  CREATININE 0.81 0.78  CALCIUM 9.0 9.1    Ct Abdomen Pelvis W Contrast  Result Date: 05/30/2017 CLINICAL DATA:  Diverticulitis.  Abscess follow-up. EXAM: CT ABDOMEN AND PELVIS WITH CONTRAST TECHNIQUE: Multidetector CT imaging of the abdomen and pelvis was performed using the standard protocol following bolus administration of intravenous contrast. CONTRAST:  174m ISOVUE-300 IOPAMIDOL (ISOVUE-300) INJECTION 61% COMPARISON:  05/24/2017 FINDINGS: Lower chest: Minimal atelectasis in the lung bases. No pleural effusion. Hepatobiliary: No focal liver abnormality is identified. Unremarkable gallbladder. No biliary dilatation. Pancreas: Unremarkable. Spleen: Unremarkable. Adrenals/Urinary Tract: Unremarkable adrenal glands. No evidence of renal mass, calculi, or  hydronephrosis. Stomach/Bowel: The stomach is within normal limits. There is no evidence of bowel obstruction. Colonic diverticulosis is again noted with persistent sigmoid colon wall thickening and surrounding inflammation consistent with diverticulitis. Gas and fluid collection extending inferiorly from the sigmoid colon has not significantly changed in size upon remeasurement, currently 2.9 x 2.5 x 2.7 cm (series 2, image 67 and series 5, image 57). This collection again extends to the superior aspect of the bladder and is inseparable from the anterior aspect of the vagina. No gas is present in the bladder to suggest a fistula. The tip of the appendix abuts the inflamed segment of sigmoid colon. There is gas throughout the appendix without evidence of acute appendicitis. Vascular/Lymphatic: No significant vascular findings are evident. Stable to minimally increased size of a 1.2 cm short axis left external iliac lymph node, likely reactive. Subcentimeter bilateral iliac and mesenteric lymph nodes are stable to slightly increased. Reproductive: Prior hysterectomy. Asymmetric prominence of the left ovary is unchanged. Other: No significant intraperitoneal free fluid. No abdominal wall mass or hernia. Musculoskeletal: Mild lumbar levoscoliosis. IMPRESSION: 1. Persistent sigmoid colon diverticulitis with unchanged diverticular abscess. 2. No new abnormality identified in the abdomen or pelvis. Electronically Signed   By: ALogan BoresM.D.   On: 05/30/2017 10:11   Assessment / Plan: 1. Sigmoid diverticulitis with abscess: No abdominal pain, no ttp-clinically improved on Zosyn but CT 10/8 showed persistent abscess with no change 2. Microcytic anemia: Sickle cell trait-labs unremarkable  -Contacted IR who continues to say that the abscess is not amenable to drainage. -Surgery saw the patient  and cleared for discharge on Augmentin with follow-up in their office. -I have given the patient an appt in our office  next week, 10/16 at 2:00 pm at which time we will discuss with Dr. Hilarie Fredrickson regarding timing of colonoscopy, which she needs prior to surgical intervention.    LOS: 7 days   Hamed Debella D.  05/31/2017, 8:55 AM  Pager number [phone]

## 2017-05-31 NOTE — Discharge Summary (Signed)
[facility]  Physician Discharge Summary   Patient ID: Amanda Garner MRN: [id number] DOB/AGE: 47-Dec-1972 47 y.o.  Admit date: 05/24/2017 Discharge date: 05/31/2017  PCP: Patient, No Pcp Per  DISCHARGE DIAGNOSES:  Principal Problem:   Abscess of sigmoid colon due to diverticulitis Active Problems:   Tobacco dependence   Marijuana abuse   Essential hypertension   Dysuria   RECOMMENDATIONS FOR OUTPATIENT FOLLOW UP: 1. Follow-up with gastroenterology and general surgery for further management of diverticulitis and abscess  DISCHARGE CONDITION: fair  Diet recommendation: Soft diet, high-fiber  Filed Weights   05/24/17 2327  Weight: 94.8 kg (209 lb)    INITIAL HISTORY: 46 year old African-American female with a past medical history of hypertension, recurrent diverticulitis, obesity, sickle cell trait, recently hospitalized in September for diverticulitis and was discharged on oral Augmentin. She went to her gastroenterologist for follow-up and mentioned persistent symptoms. CT scan was repeated, which showed slight increase in the size of the air-fluid collection adjacent to the sigmoid colon concerning for an enlarging diverticular abscess. Patient was hospitalized for further management. Patient was seen by gastroenterology. She was placed on Zosyn. She has improved significantly. CT scan to be repeated today.  Consultations:  Gastroenterology  General surgery  Procedures:  None   HOSPITAL COURSE:   Sigmoid diverticulitis with abscess. The abscess was inaccessible to percutaneous drainage. Patient was started on Zosyn. Patient improved. She is tolerating her diet. Gastroenterology was consulted. Patient underwent repeat CT scan, which shows persistent fluid collection. General surgery was subsequently consulted. Since patient had improved clinically, it was felt that further management can be pursued as an outpatient. GI to arrange for colonoscopy prior to  surgery evaluation. Patient will be discharged on Augmentin as recommended by general surgeon. Patient already has a prescription waiting for her at her pharmacy. She also has pain medications waiting for her at the pharmacy. Previously, there was concern for a colovesical fistula, but none noted in the CT scan done at admission.   Dysuria Patient mentioned burning sensation with urination. Urine culture with only 50,000 colonies of Escherichia coli. Sensitivities reviewed. She is currently on Zosyn, as was mentioned above. Patient may have to be seen by urology at some point in time. Her dysuria could have been due to the fact that the abscess was close to the urinary bladder. These symptoms have resolved.  Essential hypertension. Does not take any medications for the same at home. Blood pressure was noted to be elevated consistently. Amlodipine was initiated. Blood pressure is much better controlled now. She will be discharged on Amlodipine.  Left-sided Headache Improved with Fioricet. No focal deficits. Headache has improved.  Microcytic anemia. Hemoglobin stable. No overt bleeding. Ferritin 190. B-12 663, folate 8.8.  Tobacco dependence Cessation was encouraged. She requests Nicotine Patch at discharge.  Marijuana abuse Cessation was encouraged.  Overall much improved. She is very excited about going home today. Okay for discharge.   PERTINENT LABS:  The results of significant diagnostics from this hospitalization (including imaging, microbiology, ancillary and laboratory) are listed below for reference.    Microbiology: Recent Results (from the past 240 hour(s))  Culture, Urine     Status: Abnormal   Collection Time: 05/24/17  8:21 PM  Result Value Ref Range Status   Specimen Description URINE, CLEAN CATCH  Final   Special Requests NONE  Final   Culture 50,000 COLONIES/mL ESCHERICHIA COLI (A)  Final   Report Status 05/27/2017 FINAL  Final   Organism ID, Bacteria  ESCHERICHIA COLI (  A)  Final      Susceptibility   Escherichia coli - MIC*    AMPICILLIN >=32 RESISTANT Resistant     CEFAZOLIN 16 SENSITIVE Sensitive     CEFTRIAXONE <=1 SENSITIVE Sensitive     CIPROFLOXACIN <=0.25 SENSITIVE Sensitive     GENTAMICIN <=1 SENSITIVE Sensitive     IMIPENEM <=0.25 SENSITIVE Sensitive     NITROFURANTOIN <=16 SENSITIVE Sensitive     TRIMETH/SULFA <=20 SENSITIVE Sensitive     AMPICILLIN/SULBACTAM >=32 RESISTANT Resistant     PIP/TAZO <=4 SENSITIVE Sensitive     Extended ESBL NEGATIVE Sensitive     * 50,000 COLONIES/mL ESCHERICHIA COLI     Labs: Basic Metabolic Panel:  Recent Labs Lab 05/25/17 0539 05/27/17 0501 05/29/17 0509 05/31/17 0611  NA 141 139 141 139  K 3.6 3.6 3.5 3.7  CL 110 108 109 108  CO2 23 23 23  21*  GLUCOSE 106* 107* 113* 127*  BUN 9 5* 8 12  CREATININE 0.66 0.74 0.81 0.78  CALCIUM 8.9 8.8* 9.0 9.1   CBC:  Recent Labs Lab 05/25/17 0539 05/27/17 0501 05/29/17 0509 05/31/17 0611  WBC 9.2 7.4 7.6 7.1  HGB 11.8* 11.8* 11.8* 12.1  HCT 35.4* 35.1* 35.1* 36.0  MCV 66.0* 65.9* 66.2* 66.7*  PLT 216 208 231 251    IMAGING STUDIES Ct Abdomen Pelvis W Contrast  Result Date: 05/30/2017 CLINICAL DATA:  Diverticulitis.  Abscess follow-up. EXAM: CT ABDOMEN AND PELVIS WITH CONTRAST TECHNIQUE: Multidetector CT imaging of the abdomen and pelvis was performed using the standard protocol following bolus administration of intravenous contrast. CONTRAST:  165m ISOVUE-300 IOPAMIDOL (ISOVUE-300) INJECTION 61% COMPARISON:  05/24/2017 FINDINGS: Lower chest: Minimal atelectasis in the lung bases. No pleural effusion. Hepatobiliary: No focal liver abnormality is identified. Unremarkable gallbladder. No biliary dilatation. Pancreas: Unremarkable. Spleen: Unremarkable. Adrenals/Urinary Tract: Unremarkable adrenal glands. No evidence of renal mass, calculi, or hydronephrosis. Stomach/Bowel: The stomach is within normal limits. There is no evidence of  bowel obstruction. Colonic diverticulosis is again noted with persistent sigmoid colon wall thickening and surrounding inflammation consistent with diverticulitis. Gas and fluid collection extending inferiorly from the sigmoid colon has not significantly changed in size upon remeasurement, currently 2.9 x 2.5 x 2.7 cm (series 2, image 67 and series 5, image 57). This collection again extends to the superior aspect of the bladder and is inseparable from the anterior aspect of the vagina. No gas is present in the bladder to suggest a fistula. The tip of the appendix abuts the inflamed segment of sigmoid colon. There is gas throughout the appendix without evidence of acute appendicitis. Vascular/Lymphatic: No significant vascular findings are evident. Stable to minimally increased size of a 1.2 cm short axis left external iliac lymph node, likely reactive. Subcentimeter bilateral iliac and mesenteric lymph nodes are stable to slightly increased. Reproductive: Prior hysterectomy. Asymmetric prominence of the left ovary is unchanged. Other: No significant intraperitoneal free fluid. No abdominal wall mass or hernia. Musculoskeletal: Mild lumbar levoscoliosis. IMPRESSION: 1. Persistent sigmoid colon diverticulitis with unchanged diverticular abscess. 2. No new abnormality identified in the abdomen or pelvis. Electronically Signed   By: ALogan BoresM.D.   On: 05/30/2017 10:11   Ct Abdomen Pelvis W Contrast  Result Date: 05/24/2017 CLINICAL DATA:  Follow-up diverticular abscess with persistent fevers pain, initial encounter EXAM: CT ABDOMEN AND PELVIS WITH CONTRAST TECHNIQUE: Multidetector CT imaging of the abdomen and pelvis was performed using the standard protocol following bolus administration of intravenous contrast. CONTRAST:  1052mISOVUE-300 IOPAMIDOL (  ISOVUE-300) INJECTION 61% COMPARISON:  05/11/2017 FINDINGS: Lower chest: No acute abnormality. Hepatobiliary: Fatty infiltration of the liver is noted. The  gallbladder is within normal limits. Pancreas: Unremarkable. No pancreatic ductal dilatation or surrounding inflammatory changes. Spleen: Normal in size without focal abnormality. Adrenals/Urinary Tract: Adrenal glands are unremarkable. Kidneys are normal, without renal calculi, focal lesion, or hydronephrosis. Bladder is unremarkable. Stomach/Bowel: Diverticular change of the colon is again identified as well as changes of diverticulitis within the sigmoid colon. Persistent abscess is noted within the pelvis lying along the superior aspect of the urinary bladder. It is somewhat bilobed and measures approximately 3.1 by 3.0 cm in its largest component best seen on image number 67 of series 2. Air and fluid is noted within the collection. Just inferior to this, there is a smaller air-fluid collection noted which measures approximately 2.1 cm in greatest dimension. This is best visualized on image number 69 of series 2. The 2 air-fluid areas intra communicate and lie just anterior to the residual vaginal wall. No contrast is noted within. No air is noted within the bladder to suggest fistulization. Vascular/Lymphatic: No aortic abnormality is identified. Stable mesenteric lymph node is noted image number 46 of series 2. No other significant adenopathy is noted. Reproductive: The uterus has been surgically removed. The left ovary is prominent but stable. The right ovary is within normal limits. Other: No abdominal wall hernia or abnormality. No abdominopelvic ascites. Musculoskeletal: No acute or significant osseous findings. IMPRESSION: Slight increase in size of the air-fluid collection adjacent to the sigmoid colon consistent with an enlarging diverticular abscess. It remains inaccessible for percutaneous drainage due to its proximity to the colon and multiple intervening small bowel loops. No evidence of fistulization is noted at this time to the bladder or vaginal cuff. The remainder of the exam is stable in  appearance. These results will be called to the ordering clinician or representative by the Radiologist Assistant, and communication documented in the PACS or zVision Dashboard. Electronically Signed   By: Inez Catalina M.D.   On: 05/24/2017 15:12   Ct Abdomen Pelvis W Contrast  Result Date: 05/11/2017 CLINICAL DATA:  46 year old with constipation and left lower quadrant pain. History of Crohn's disease. EXAM: CT ABDOMEN AND PELVIS WITH CONTRAST TECHNIQUE: Multidetector CT imaging of the abdomen and pelvis was performed using the standard protocol following bolus administration of intravenous contrast. CONTRAST:  114m ISOVUE-300 IOPAMIDOL (ISOVUE-300) INJECTION 61% COMPARISON:  10/19/2016 FINDINGS: Lower chest: Lung bases are clear. Hepatobiliary: Decreased attenuation of the liver is compatible with steatosis. No focal liver lesion. No significant biliary dilatation. Portal venous system appears to be patent. Normal appearance of the gallbladder. Pancreas: Normal appearance of the pancreas without inflammation or duct dilatation. Spleen: Normal appearance of spleen without enlargement. Adrenals/Urinary Tract: Normal adrenal glands. No suspicious renal lesions and no hydronephrosis. Stomach/Bowel: Normal appearance of stomach and duodenum. Normal appearance of small bowel without dilatation. There is a extensive diverticulosis involving the sigmoid colon with wall thickening along the posterior wall of the sigmoid colon on sequence 3, image 69. Adjacent to this wall thickening, there is a small air-fluid collection compatible with a pericolonic abscess. This abscess measures 2.5 x 2.2 x 2.6 cm. Mild stranding along the left side of the pelvis adjacent to this small abscess. This abscess is surrounded by the anterior aspect of the uterus, posterior dome of the bladder and the posterior wall of the sigmoid colon. There is no definite gas within the uterus or urinary bladder. There  is oral contrast within the  sigmoid colon but there is no contrast within this small abscess cavity. Normal appearance of the appendix. Vascular/Lymphatic: No significant atherosclerotic disease in the abdomen or pelvis. Again noted is a prominent lymph node in the right posterior mesentery on sequence 3, image 49 measuring 1.1 cm in short axis and stable. Overall, there is not significant lymph node enlargement the abdomen and pelvis. Few prominent left periaortic lymph nodes. Reproductive: Small air-fluid collection just anterior to the uterus is compatible with an abscess. There is no definite gas tracking into the uterus itself. Left ovary is slightly prominent but stable. Normal appearance of the right adnexa. Other: No significant free fluid.  No free intraperitoneal air. Musculoskeletal: No acute abnormality. IMPRESSION: Sigmoid colon diverticulitis with a pericolonic abscess. The abscess is located within the central aspect of the pelvis and adjacent to the anterior wall of the uterus, posterior bladder dome and the posterior wall of the sigmoid colon. Abscess measures up to 2.6 cm. This small abscess is not amendable to percutaneous drainage. Hepatic steatosis. Electronically Signed   By: Markus Daft M.D.   On: 05/11/2017 17:42    DISCHARGE EXAMINATION: Vitals:   05/30/17 2233 05/31/17 0625 05/31/17 0629 05/31/17 0827  BP: 120/74 139/81 (!) 131/54 128/90  Pulse: 72 80 60   Resp: 18 18 18    Temp: 97.9 F (36.6 C) 97.9 F (36.6 C) 98.5 F (36.9 C)   TempSrc: Oral Oral Oral   SpO2: 100% 95% 95%   Weight:      Height:       General appearance: alert, cooperative, appears stated age and no distress Resp: clear to auscultation bilaterally Cardio: regular rate and rhythm, S1, S2 normal, no murmur, click, rub or gallop GI: soft, non-tender; bowel sounds normal; no masses,  no organomegaly  DISPOSITION: Home  Discharge Instructions    Call MD for:  extreme fatigue    Complete by:  As directed    Call MD for:   persistant dizziness or light-headedness    Complete by:  As directed    Call MD for:  persistant nausea and vomiting    Complete by:  As directed    Call MD for:  severe uncontrolled pain    Complete by:  As directed    Call MD for:  temperature >100.4    Complete by:  As directed    Discharge instructions    Complete by:  As directed    Please call the surgeon's office to schedule follow up. Take your medications as prescribed. Soft diet with high fiber.  You were cared for by a hospitalist during your hospital stay. If you have any questions about your discharge medications or the care you received while you were in the hospital after you are discharged, you can call the unit and asked to speak with the hospitalist on call if the hospitalist that took care of you is not available. Once you are discharged, your primary care physician will handle any further medical issues. Please note that NO REFILLS for any discharge medications will be authorized once you are discharged, as it is imperative that you return to your primary care physician (or establish a relationship with a primary care physician if you do not have one) for your aftercare needs so that they can reassess your need for medications and monitor your lab values. If you do not have a primary care physician, you can call [phone] for a physician referral.  Increase activity slowly    Complete by:  As directed       ALLERGIES: No Known Allergies   Discharge Medication List as of 05/31/2017  9:48 AM    START taking these medications   Details  amLODipine (NORVASC) 5 MG tablet Take 1 tablet (5 mg total) by mouth daily., Starting Wed 06/01/2017, Normal    butalbital-acetaminophen-caffeine (FIORICET, ESGIC) 50-325-40 MG tablet Take 1 tablet by mouth every 6 (six) hours as needed for headache., Starting Tue 05/31/2017, Print    docusate sodium (COLACE) 100 MG capsule Take 1 capsule (100 mg total) by mouth 2 (two) times daily.,  Starting Tue 05/31/2017, Normal    nicotine (NICODERM CQ - DOSED IN MG/24 HOURS) 14 mg/24hr patch Place 1 patch (14 mg total) onto the skin daily., Starting Wed 06/01/2017, Normal    pantoprazole (PROTONIX) 40 MG tablet Take 1 tablet (40 mg total) by mouth daily at 12 noon., Starting Tue 05/31/2017, Normal      CONTINUE these medications which have NOT CHANGED   Details  amoxicillin-clavulanate (AUGMENTIN) 875-125 MG tablet Take 1 tab twice daily for 14 days., Normal    oxyCODONE-acetaminophen (ROXICET) 5-325 MG tablet Take 1 tablet by mouth every 6 (six) hours as needed for severe pain., Starting Tue 05/24/2017, Print         Follow-up [facility] Follow up.   Specialty:  General Surgery Why:  call to schedule appointment Contact information: [address] [phone]       Loralie Champagne, PA-C Follow up on 06/07/2017.   Specialty:  Gastroenterology Why:  2:00 pm Contact information: [place] [address] [phone]           TOTAL DISCHARGE TIME: 58 mins  [facility] Pager [phone]  05/31/2017, 11:40 AM

## 2017-06-07 ENCOUNTER — Encounter: Payer: Self-pay | Admitting: Gastroenterology

## 2017-06-07 ENCOUNTER — Ambulatory Visit (INDEPENDENT_AMBULATORY_CARE_PROVIDER_SITE_OTHER): Payer: BLUE CROSS/BLUE SHIELD | Admitting: Gastroenterology

## 2017-06-07 VITALS — BP 132/90 | HR 88 | Ht 65.0 in | Wt 207.4 lb

## 2017-06-07 DIAGNOSIS — K5720 Diverticulitis of large intestine with perforation and abscess without bleeding: Secondary | ICD-10-CM

## 2017-06-07 MED ORDER — NA SULFATE-K SULFATE-MG SULF 17.5-3.13-1.6 GM/177ML PO SOLN: 1.0000 | Freq: Once | ORAL | 0 refills | Status: AC

## 2017-06-07 NOTE — Addendum Note (Signed)
Addended by: Jerene Bears on: 06/07/2017 03:55 PM   Modules accepted: Level of Service

## 2017-06-07 NOTE — Progress Notes (Addendum)
06/07/2017 Amanda Garner [id number] 1971-08-08   HISTORY OF PRESENT ILLNESS:  This is a 46 year old female who is a patient of Dr. Vena Rua.  She was recently hospitalized from 10/2-10/9 for persistent diverticular related abscess.  Failed outpatient antibiotics.  Was admitted and placed on Zosyn.  Repeat CT scan on 10/8 showed persistent abscess but unchanged in size and patient was much improved.  Here today for hospital follow-up.  Needs to schedule/discuss colonoscopy.  Will likely need eventual surgery with segmental resection.  She continues to feel well.  Has completed 7 of 14 days of Augmentin.  No abdominal pain, fevers.  Has been working.   Past Medical History:  Diagnosis Date  . Anemia Dx: 2001 or so   . Blood transfusion   . Crohn disease ([place])   . Diverticulitis   . Hypertension   . Sickle cell trait [facility])    Past Surgical History:  Procedure Laterality Date  . ABDOMINAL HYSTERECTOMY  2001   partial hysterectomy - emergency during last C-section  . [place]; 1997; 2001    reports that she has been smoking Cigarettes.  She has a 9.30 pack-year smoking history. She has never used smokeless tobacco. She reports that she drinks alcohol. She reports that she does not use drugs. family history includes Diabetes type II in her father and mother; Lung cancer (age of onset: 45) in her father; Stroke in her father. No Known Allergies    Outpatient Encounter Prescriptions as of 06/07/2017  Medication Sig  . amLODipine (NORVASC) 5 MG tablet Take 1 tablet (5 mg total) by mouth daily.  Marland Kitchen amoxicillin-clavulanate (AUGMENTIN) 875-125 MG tablet Take 1 tab twice daily for 14 days.  . butalbital-acetaminophen-caffeine (FIORICET, ESGIC) 50-325-40 MG tablet Take 1 tablet by mouth every 6 (six) hours as needed for headache.  . nicotine (NICODERM CQ - DOSED IN MG/24 HOURS) 14 mg/24hr patch Place 1 patch (14 mg total) onto the skin daily.  Marland Kitchen oxyCODONE-acetaminophen  (ROXICET) 5-325 MG tablet Take 1 tablet by mouth every 6 (six) hours as needed for severe pain.  . pantoprazole (PROTONIX) 40 MG tablet Take 1 tablet (40 mg total) by mouth daily at 12 noon.  . [DISCONTINUED] docusate sodium (COLACE) 100 MG capsule Take 1 capsule (100 mg total) by mouth 2 (two) times daily.   No facility-administered encounter medications on file as of 06/07/2017.      REVIEW OF SYSTEMS  : All other systems reviewed and negative except where noted in the History of Present Illness.   PHYSICAL EXAM: BP 132/90   Pulse 88   Ht 5' 5"  (1.651 m)   Wt 207 lb 6.4 oz (94.1 kg)   LMP 08/20/2011   BMI 34.51 kg/m  General: Well developed black female in no acute distress Head: Normocephalic and atraumatic Eyes: Sclerae anicteric, conjunctiva pink. Ears: Normal auditory acuity Lungs: Clear throughout to auscultation; no increased WOB. Heart: Regular rate and rhythm; no M/R/G Abdomen: Soft, non-distended.  BS present.  Minimal LLQ tenderness on deep palpation. Rectal:  Will be done at the time of colonoscopy. Musculoskeletal: Symmetrical with no gross deformities  Skin: No lesions on visible extremities Extremities: No edema  Neurological: Alert oriented x 4, grossly non-focal Psychological:  Alert and cooperative. Normal mood and affect  ASSESSMENT AND PLAN: *Sigmoid diverticulitis with persistent abscess:No abdominal pain or other symptoms but CT 10/8 showed persistent abscess with no change.  Unable to be drained per IR.  Is  still on Augmentin for another week.  Needs eventual surgery but needs colonoscopy first.  Discussed with Dr. Hilarie Fredrickson and will schedule for about 3 weeks from now.  She needs to make an appt to be seen at [place] after her colonoscopy.  Emphasized that she needs to call us back if pain or other symptoms return from now until her colonoscopy.  **The risks, benefits, and alternatives to colonoscopy were discussed with the patient and she consents to proceed.     CC:  No ref. provider found   Addendum: Reviewed and agree with management. A she will need to complete antibiotics and be free of clinical symptoms of diverticulitis before proceeding with colonoscopy. Expect she will need segmental colon resection after colonoscopy and she should have surgical followup Pyrtle, Lajuan Lines, MD

## 2017-06-07 NOTE — Patient Instructions (Addendum)
If you are age 47 or older, your body mass index should be between 23-30. Your Body mass index is 34.51 kg/m. If this is out of the aforementioned range listed, please consider follow up with your Primary Care Provider.  If you are age 24 or younger, your body mass index should be between 19-25. Your Body mass index is 34.51 kg/m. If this is out of the aformentioned range listed, please consider follow up with your Primary Care Provider.   We have sent the following medications to your pharmacy for you to pick up at your convenience:  [place] have been scheduled for a colonoscopy. Please follow written instructions given to you at your visit today.  Please pick up your prep supplies at the pharmacy within the next 1-3 days. If you use inhalers (even only as needed), please bring them with you on the day of your procedure. Your physician has requested that you go to www.startemmi.com and enter the access code given to you at your visit today. This web site gives a general overview about your procedure. However, you should still follow specific instructions given to you by our office regarding your preparation for the procedure.  Please contact [facility] at [phone] for a follow up appointment.  Please contant Alonza Bogus, PA for recurrent symptoms.  Thank you.

## 2017-06-28 ENCOUNTER — Encounter: Payer: Self-pay | Admitting: Internal Medicine

## 2017-07-04 ENCOUNTER — Telehealth: Payer: Self-pay | Admitting: Internal Medicine

## 2017-07-05 ENCOUNTER — Encounter: Payer: BLUE CROSS/BLUE SHIELD | Admitting: Internal Medicine

## 2017-09-05 ENCOUNTER — Encounter: Payer: BLUE CROSS/BLUE SHIELD | Admitting: Internal Medicine

## 2017-09-21 NOTE — Telephone Encounter (Signed)
Done

## 2017-09-22 ENCOUNTER — Telehealth: Payer: Self-pay | Admitting: Internal Medicine

## 2017-09-22 ENCOUNTER — Encounter: Payer: BLUE CROSS/BLUE SHIELD | Admitting: Internal Medicine

## 2018-01-28 NOTE — Telephone Encounter (Signed)
PT was a no call no show. Did not answer our calls when we called to attempt to reschedule.

## 2018-05-17 ENCOUNTER — Ambulatory Visit: Payer: Self-pay | Admitting: Podiatry

## 2018-05-20 ENCOUNTER — Ambulatory Visit: Payer: No Typology Code available for payment source | Admitting: Family Medicine

## 2018-05-20 NOTE — Progress Notes (Deleted)
No chief complaint on file.   HPI  4 review of systems  Past Medical History:  Diagnosis Date  . Anemia Dx: 2001 or so   . Blood transfusion   . Crohn disease ([place])   . Diverticulitis   . Hypertension   . Sickle cell trait ([place])     Current Outpatient Medications  Medication Sig Dispense Refill  . amLODipine (NORVASC) 5 MG tablet Take 1 tablet (5 mg total) by mouth daily. 30 tablet 0  . amoxicillin-clavulanate (AUGMENTIN) 875-125 MG tablet Take 1 tab twice daily for 14 days. 28 tablet 0  . butalbital-acetaminophen-caffeine (FIORICET, ESGIC) 50-325-40 MG tablet Take 1 tablet by mouth every 6 (six) hours as needed for headache. 14 tablet 0  . nicotine (NICODERM CQ - DOSED IN MG/24 HOURS) 14 mg/24hr patch Place 1 patch (14 mg total) onto the skin daily. 30 patch 1  . oxyCODONE-acetaminophen (ROXICET) 5-325 MG tablet Take 1 tablet by mouth every 6 (six) hours as needed for severe pain. 40 tablet 0  . pantoprazole (PROTONIX) 40 MG tablet Take 1 tablet (40 mg total) by mouth daily at 12 noon. 30 tablet 0   No current facility-administered medications for this visit.     Allergies: No Known Allergies  Past Surgical History:  Procedure Laterality Date  . ABDOMINAL HYSTERECTOMY  2001   partial hysterectomy - emergency during last C-section  . [place]; 1997; 2001    Social History   Socioeconomic History  . Marital status: Married    Spouse name: Not on file  . Number of children: 3  . Years of education: Not on file  . Highest education level: Not on file  Occupational History  . Occupation: Public house manager: [employer]  . Financial resource strain: Not on file  . Food insecurity:    Worry: Not on file    Inability: Not on file  . Transportation needs:    Medical: Not on file    Non-medical: Not on file  Tobacco Use  . Smoking status: Current Every Day Smoker   Packs/day: 0.30    Years: 31.00    Pack years: 9.30    Types: Cigarettes  . Smokeless tobacco: Never Used  . Tobacco comment: needs a patch  Substance and Sexual Activity  . Alcohol use: Yes    Alcohol/week: 0.0 standard drinks    Comment: occasionally   . Drug use: No  . Sexual activity: Yes    Partners: Male    Birth control/protection: Surgical  Lifestyle  . Physical activity:    Days per week: Not on file    Minutes per session: Not on file  . Stress: Not on file  Relationships  . Social connections:    Talks on phone: Not on file    Gets together: Not on file    Attends religious service: Not on file    Active member of club or organization: Not on file    Attends meetings of clubs or organizations: Not on file    Relationship status: Not on file  Other Topics Concern  . Not on file  Social History Narrative  . Not on file    Family History  Problem Relation Age of Onset  . Diabetes type II Mother   . Diabetes type II Father   . Stroke Father   . Lung cancer Father 2  . Colon cancer  Neg Hx      ROS Review of Systems See HPI Constitution: No fevers or chills No malaise No diaphoresis Skin: No rash or itching Eyes: no blurry vision, no double vision GU: no dysuria or hematuria Neuro: no dizziness or headaches * all others reviewed and negative   Objective: There were no vitals filed for this visit.  Physical Exam  Assessment and Plan There are no diagnoses linked to this encounter.   Delores P [employer]

## 2018-06-05 ENCOUNTER — Emergency Department (HOSPITAL_COMMUNITY): Payer: Commercial Managed Care - PPO

## 2018-06-05 ENCOUNTER — Other Ambulatory Visit: Payer: Self-pay

## 2018-06-05 ENCOUNTER — Emergency Department (HOSPITAL_COMMUNITY)
Admission: EM | Admit: 2018-06-05 | Discharge: 2018-06-05 | Disposition: A | Discharge status: Home / Self Care | Payer: Commercial Managed Care - PPO | Attending: Emergency Medicine | Admitting: Emergency Medicine

## 2018-06-05 ENCOUNTER — Encounter (HOSPITAL_COMMUNITY): Payer: Self-pay | Admitting: Emergency Medicine

## 2018-06-05 DIAGNOSIS — R079 Chest pain, unspecified: Secondary | ICD-10-CM | POA: Diagnosis not present

## 2018-06-05 DIAGNOSIS — F17210 Nicotine dependence, cigarettes, uncomplicated: Secondary | ICD-10-CM | POA: Insufficient documentation

## 2018-06-05 HISTORY — DX: Crohn's disease, unspecified, without complications: K50.90

## 2018-06-05 HISTORY — DX: Gastro-esophageal reflux disease without esophagitis: K21.9

## 2018-06-05 LAB — CBC
HCT: 38.5 % (ref 36.0–46.0)
Hemoglobin: 12.4 g/dL (ref 12.0–15.0)
MCH: 22.1 pg — AB (ref 26.0–34.0)
MCHC: 32.2 g/dL (ref 30.0–36.0)
MCV: 68.8 fL — ABNORMAL LOW (ref 80.0–100.0)
NRBC: 0 % (ref 0.0–0.2)
PLATELETS: 235 10*3/uL (ref 150–400)
RBC: 5.6 MIL/uL — ABNORMAL HIGH (ref 3.87–5.11)
RDW: 14.6 % (ref 11.5–15.5)
WBC: 7.1 10*3/uL (ref 4.0–10.5)

## 2018-06-05 LAB — BASIC METABOLIC PANEL
ANION GAP: 10 (ref 5–15)
BUN: 12 mg/dL (ref 6–20)
CALCIUM: 8.8 mg/dL — AB (ref 8.9–10.3)
CO2: 21 mmol/L — ABNORMAL LOW (ref 22–32)
Chloride: 108 mmol/L (ref 98–111)
Creatinine, Ser: 0.9 mg/dL (ref 0.44–1.00)
GLUCOSE: 128 mg/dL — AB (ref 70–99)
Potassium: 3.8 mmol/L (ref 3.5–5.1)
SODIUM: 139 mmol/L (ref 135–145)

## 2018-06-05 LAB — I-STAT BETA HCG BLOOD, ED (MC, WL, AP ONLY): I-stat hCG, quantitative: <5 m[IU]/mL (ref <5)

## 2018-06-05 LAB — I-STAT TROPONIN, ED
TROPONIN I, POC: 0 ng/mL (ref 0.00–0.08)
Troponin i, poc: 0 ng/mL (ref 0.00–0.08)

## 2018-06-05 MED ORDER — SUCRALFATE 1 G PO TABS: 1.0000 g | ORAL_TABLET | Freq: Three times a day (TID) | ORAL | 0 refills | Status: DC

## 2018-06-05 MED ORDER — ONDANSETRON HCL 4 MG/2ML IJ SOLN
4.0000 mg | Freq: Once | INTRAMUSCULAR | Status: AC
  Administered 2018-06-05: 4 mg via INTRAVENOUS
  Filled 2018-06-05: qty 2

## 2018-06-05 MED ORDER — MORPHINE SULFATE (PF) 4 MG/ML IV SOLN
4.0000 mg | Freq: Once | INTRAVENOUS | Status: AC
  Administered 2018-06-05: 4 mg via INTRAVENOUS
  Filled 2018-06-05: qty 1

## 2018-06-05 MED ORDER — GI COCKTAIL ~~LOC~~
30.0000 mL | Freq: Once | ORAL | Status: AC
  Administered 2018-06-05: 30 mL via ORAL
  Filled 2018-06-05: qty 30

## 2018-06-05 MED ORDER — FAMOTIDINE IN NACL 20-0.9 MG/50ML-% IV SOLN
20.0000 mg | Freq: Once | INTRAVENOUS | Status: AC
  Administered 2018-06-05: 20 mg via INTRAVENOUS
  Filled 2018-06-05: qty 50

## 2018-06-05 MED ORDER — OMEPRAZOLE 20 MG PO CPDR: 20.0000 mg | DELAYED_RELEASE_CAPSULE | Freq: Every day | ORAL | 0 refills | Status: DC

## 2018-06-05 NOTE — ED Provider Notes (Signed)
[facility] EMERGENCY DEPARTMENT Provider Note   CSN: [id number] Arrival date & time: 06/05/18  0113     History   Chief Complaint Chief Complaint  Patient presents with  . Chest Pain    HPI Amanda Garner is a 47 y.o. female.  Patient presents to the emergency department with chief complaint of chest pain.  She reports associated nausea and diarrhea.  She states that she does have severe acid reflux, but her normal acid reflux medication did not help.  She denies any fevers or chills.  She denies any radiating pain.  Denies any history of heart or lung problems.  Denies any history of PE or DVT.  Denies any recent surgery or immobilization.  She does not take birth control.  She denies any other associated symptoms.  The history is provided by the patient. No language interpreter was used.    Past Medical History:  Diagnosis Date  . Acid reflux   . Crohn's disease ([place])     There are no active problems to display for this patient.   Past Surgical History:  Procedure Laterality Date  . CESAREAN SECTION       OB History   None      Home Medications    Prior to Admission medications   Not on File    Family History History reviewed. No pertinent family history.  Social History Social History   Tobacco Use  . Smoking status: Current Every Day Smoker    Packs/day: 1.00  . Smokeless tobacco: Never Used  Substance Use Topics  . Alcohol use: Yes    Comment: occassionally  . Drug use: Never     Allergies   Patient has no known allergies.   Review of Systems Review of Systems  All other systems reviewed and are negative.    Physical Exam Updated Vital Signs BP (!) 170/105 (BP Location: Right Arm)   Pulse 79   Temp 97.7 F (36.5 C) (Oral)   Resp 20   SpO2 100%   Physical Exam  Constitutional: She is oriented to person, place, and time. She appears well-developed and well-nourished.  HENT:  Head: Normocephalic and  atraumatic.  Eyes: Pupils are equal, round, and reactive to light. Conjunctivae and EOM are normal.  Neck: Normal range of motion. Neck supple.  Cardiovascular: Normal rate and regular rhythm. Exam reveals no gallop and no friction rub.  No murmur heard. Pulmonary/Chest: Effort normal and breath sounds normal. No respiratory distress. She has no wheezes. She has no rales. She exhibits no tenderness.  Abdominal: Soft. Bowel sounds are normal. She exhibits no distension and no mass. There is no tenderness. There is no rebound and no guarding.  Musculoskeletal: Normal range of motion. She exhibits no edema or tenderness.  Neurological: She is alert and oriented to person, place, and time.  Skin: Skin is warm and dry.  Psychiatric: She has a normal mood and affect. Her behavior is normal. Judgment and thought content normal.  Nursing note and vitals reviewed.    ED Treatments / Results  Labs (all labs ordered are listed, but only abnormal results are displayed) Labs Reviewed  CBC - Abnormal; Notable for the following components:      Result Value   RBC 5.60 (*)    MCV 68.8 (*)    MCH 22.1 (*)    All other components within normal limits  BASIC METABOLIC PANEL  I-STAT TROPONIN, ED  I-STAT BETA HCG BLOOD, ED ([facility], [facility],  [facility] ONLY)    EKG EKG Interpretation  Date/Time:  Monday June 05 2018 01:17:47 EDT Ventricular Rate:  77 PR Interval:  128 QRS Duration: 72 QT Interval:  384 QTC Calculation: 434 R Axis:   45 Text Interpretation:  Normal sinus rhythm Normal ECG No old tracing to compare Confirmed by Delora Fuel ([id number]) on 06/05/2018 1:26:54 AM   Radiology Dg Chest 2 View  Result Date: 06/05/2018 CLINICAL DATA:  Chest pain starting this evening EXAM: CHEST - 2 VIEW COMPARISON:  None. FINDINGS: The heart size and mediastinal contours are within normal limits. Both lungs are clear. Prominent coracoid processes bilaterally. No acute osseous abnormality. IMPRESSION: No active  cardiopulmonary disease. Electronically Signed   By: Ashley Royalty M.D.   On: 06/05/2018 01:52    Procedures Procedures (including critical care time)  Medications Ordered in ED Medications  gi cocktail (Maalox,Lidocaine,Donnatal) (has no administration in time range)  famotidine (PEPCID) IVPB 20 mg premix (has no administration in time range)  morphine 4 MG/ML injection 4 mg (has no administration in time range)  ondansetron (ZOFRAN) injection 4 mg (has no administration in time range)     Initial Impression / Assessment and Plan / ED Course  I have reviewed the triage vital signs and the nursing notes.  Pertinent labs & imaging results that were available during my care of the patient were reviewed by me and considered in my medical decision making (see chart for details).     Patient with chest pain that started yesterday at 6 PM.  She states it feels similar to her acid reflux, but is a little bit worse.  She reports associated nausea and diarrhea.  Patient is afebrile.  She is not hypoxic.  She is not tachycardic.  Low risk for PE, Wells score is 0, PERC negative.  Troponin is negative, onset of symptoms was 8 hours ago.  Delta trop negative.  EKG is normal, no ischemic changes or concerning arrhythmias.  Chest x-ray is normal, no evidence of infection or other acute process.  She does not have any focal abdominal tenderness.  I suspect that her symptoms are gastritis and exacerbation of GERD/acid reflux.    Patient feels improved after treatment with zofran, GI cocktail, pepcid, and morphine.  PCP follow-up.  Final Clinical Impressions(s) / ED Diagnoses   Final diagnoses:  Chest pain, unspecified type    ED Discharge Orders         Ordered    omeprazole (PRILOSEC) 20 MG capsule  Daily     06/05/18 0404    sucralfate (CARAFATE) 1 g tablet  3 times daily with meals & bedtime     06/05/18 0404           Montine Circle, PA-C 06/05/18 0405    Palumbo, April,  MD 06/05/18 [id number]

## 2018-06-05 NOTE — ED Notes (Signed)
Patient transported to X-ray 

## 2018-06-05 NOTE — ED Notes (Signed)
ED Provider at bedside. 

## 2018-06-05 NOTE — ED Triage Notes (Signed)
Pt reports CP starting tonight. Pt reports she though it was reflux, took ranitidine with no relief. Pt reports pain is to L chest, no radiation. Pt reports nausea and SHOB. Pt denies vomiting.

## 2018-07-10 ENCOUNTER — Encounter (HOSPITAL_COMMUNITY): Payer: Self-pay | Admitting: Emergency Medicine

## 2018-07-10 ENCOUNTER — Emergency Department (HOSPITAL_COMMUNITY)
Admission: EM | Admit: 2018-07-10 | Discharge: 2018-07-10 | Disposition: A | Discharge status: Home / Self Care | Payer: Commercial Managed Care - PPO | Attending: Emergency Medicine | Admitting: Emergency Medicine

## 2018-07-10 DIAGNOSIS — F17210 Nicotine dependence, cigarettes, uncomplicated: Secondary | ICD-10-CM | POA: Insufficient documentation

## 2018-07-10 DIAGNOSIS — M79671 Pain in right foot: Secondary | ICD-10-CM | POA: Insufficient documentation

## 2018-07-10 DIAGNOSIS — M79672 Pain in left foot: Secondary | ICD-10-CM | POA: Diagnosis not present

## 2018-07-10 DIAGNOSIS — Z79899 Other long term (current) drug therapy: Secondary | ICD-10-CM | POA: Diagnosis not present

## 2018-07-10 MED ORDER — CLOTRIMAZOLE 1 % EX CREA: TOPICAL_CREAM | CUTANEOUS | 0 refills | Status: DC

## 2018-07-10 NOTE — ED Triage Notes (Signed)
Pt c/o burning "like fire ants" and spots on bilat feet for about 6 months. Reports where picks and digs at them now created "holes".

## 2018-07-10 NOTE — ED Provider Notes (Signed)
[facility] DEPT Provider Note   CSN: [id number] Arrival date & time: 07/10/18  4166     History   Chief Complaint Chief Complaint  Patient presents with  . feet burning    HPI Amanda Garner is a 47 y.o. female.  HPI Patient is a 47 year old female presents the emergency department with a burning sensation of her bilateral feet over the past 4 to 6 months.  She states they have worsened over the past several weeks.  States it feels like fire ants around both of her feet.  No history of diabetes.  She reports scratching them.  Symptoms are moderate in severity.  No medications attempted prior to arrival   Past Medical History:  Diagnosis Date  . Acid reflux   . Crohn's disease ([place])     There are no active problems to display for this patient.   Past Surgical History:  Procedure Laterality Date  . CESAREAN SECTION       OB History   None      Home Medications    Prior to Admission medications   Medication Sig Start Date End Date Taking? Authorizing Provider       Jola Schmidt, MD  omeprazole (PRILOSEC) 20 MG capsule Take 1 capsule (20 mg total) by mouth daily. 06/05/18   Montine Circle, PA-C  sucralfate (CARAFATE) 1 g tablet Take 1 tablet (1 g total) by mouth 4 (four) times daily -  with meals and at bedtime. 06/05/18   Montine Circle, PA-C    Family History No family history on file.  Social History Social History   Tobacco Use  . Smoking status: Current Every Day Smoker    Packs/day: 1.00  . Smokeless tobacco: Never Used  Substance Use Topics  . Alcohol use: Yes    Comment: occassionally  . Drug use: Never     Allergies   Patient has no known allergies.   Review of Systems Review of Systems  All other systems reviewed and are negative.    Physical Exam Updated Vital Signs BP (!) 167/118 (BP Location: Left Arm) Comment: RN aware. Pt stated that she has no primary care.  Pulse 86   Temp 98.3 F  (36.8 C) (Oral)   Resp 16   Ht 5' 6"  (1.676 m)   Wt 94.8 kg   SpO2 98%   BMI 33.73 kg/m   Physical Exam  Constitutional: She is oriented to person, place, and time. She appears well-developed and well-nourished.  HENT:  Head: Normocephalic.  Eyes: EOM are normal.  Neck: Normal range of motion.  Pulmonary/Chest: Effort normal.  Abdominal: She exhibits no distension.  Musculoskeletal: Normal range of motion.  Bilateral PT and DP pulses present. Excoriations of both feet.   Neurological: She is alert and oriented to person, place, and time.  Psychiatric: She has a normal mood and affect.  Nursing note and vitals reviewed.    ED Treatments / Results  Labs (all labs ordered are listed, but only abnormal results are displayed) Labs Reviewed - No data to display  EKG None  Radiology No results found.  Procedures Procedures (including critical care time)  Medications Ordered in ED Medications - No data to display   Initial Impression / Assessment and Plan / ED Course  I have reviewed the triage vital signs and the nursing notes.  Pertinent labs & imaging results that were available during my care of the patient were reviewed by me and considered in my medical  decision making (see chart for details).     Nonspecific rash of both of her feet.  She has normal pulses.  There is no secondary signs of infection.  This may represent a fungal infection.  She is been prescribed Lotrimin cream.  She will need follow-up with a podiatrist.  Final Clinical Impressions(s) / ED Diagnoses   Final diagnoses:  Bilateral foot pain    ED Discharge Orders         Ordered    clotrimazole (LOTRIMIN) 1 % cream     07/10/18 0942           Jola Schmidt, MD 07/10/18 [phone]

## 2018-07-10 NOTE — Discharge Instructions (Addendum)
Contact your insurance company to find a primary care provider in the community who accepts your health insurance

## 2018-07-14 ENCOUNTER — Encounter: Payer: Self-pay | Admitting: Podiatry

## 2018-07-14 ENCOUNTER — Other Ambulatory Visit: Payer: Self-pay | Admitting: Podiatry

## 2018-07-14 ENCOUNTER — Ambulatory Visit (INDEPENDENT_AMBULATORY_CARE_PROVIDER_SITE_OTHER): Payer: Commercial Managed Care - PPO | Admitting: Podiatry

## 2018-07-14 ENCOUNTER — Ambulatory Visit (INDEPENDENT_AMBULATORY_CARE_PROVIDER_SITE_OTHER): Payer: Commercial Managed Care - PPO

## 2018-07-14 VITALS — BP 157/99 | HR 84 | Resp 16

## 2018-07-14 DIAGNOSIS — L600 Ingrowing nail: Secondary | ICD-10-CM

## 2018-07-14 DIAGNOSIS — M2010 Hallux valgus (acquired), unspecified foot: Secondary | ICD-10-CM

## 2018-07-14 DIAGNOSIS — M21611 Bunion of right foot: Secondary | ICD-10-CM | POA: Diagnosis not present

## 2018-07-14 DIAGNOSIS — B351 Tinea unguium: Secondary | ICD-10-CM

## 2018-07-14 DIAGNOSIS — M21612 Bunion of left foot: Secondary | ICD-10-CM

## 2018-07-14 DIAGNOSIS — M21619 Bunion of unspecified foot: Secondary | ICD-10-CM

## 2018-07-14 LAB — HEPATIC FUNCTION PANEL
AG Ratio: 1.6 (calc) (ref 1.0–2.5)
ALKALINE PHOSPHATASE (APISO): 63 U/L (ref 33–115)
ALT: 9 U/L (ref 6–29)
AST: 14 U/L (ref 10–35)
Albumin: 4.2 g/dL (ref 3.6–5.1)
BILIRUBIN DIRECT: 0.1 mg/dL (ref 0.0–0.2)
BILIRUBIN TOTAL: 0.4 mg/dL (ref 0.2–1.2)
Globulin: 2.6 g/dL (calc) (ref 1.9–3.7)
Indirect Bilirubin: 0.3 mg/dL (calc) (ref 0.2–1.2)
Total Protein: 6.8 g/dL (ref 6.1–8.1)

## 2018-07-14 MED ORDER — TERBINAFINE HCL 250 MG PO TABS: 250.0000 mg | ORAL_TABLET | Freq: Every day | ORAL | 0 refills | Status: DC

## 2018-07-14 MED ORDER — NEOMYCIN-POLYMYXIN-HC 3.5-10000-1 OT SOLN: OTIC | 1 refills | Status: DC

## 2018-07-14 MED ORDER — NEOMYCIN-POLYMYXIN-HC 3.5-10000-1 OT SOLN: OTIC | 0 refills | Status: DC

## 2018-07-14 MED ORDER — METHYLPREDNISOLONE 4 MG PO TBPK: ORAL_TABLET | ORAL | 0 refills | Status: DC

## 2018-07-14 NOTE — Patient Instructions (Signed)

## 2018-07-14 NOTE — Progress Notes (Signed)
   Subjective:    Patient ID: Amanda Garner, female    DOB: 1971/07/17, 47 y.o.   MRN: [id number]  HPI    Review of Systems  All other systems reviewed and are negative.      Objective:   Physical Exam        Assessment & Plan:

## 2018-07-16 NOTE — Progress Notes (Signed)
Subjective:   Patient ID: Amanda Garner, female   DOB: 47 y.o.   MRN: [id number]   HPI Patient presents stating that there is been some burning on the bottom of both feet and blistering x1 year also states that there is been some thick toenail deformity and that she has had ingrown toenail component.  Patient's tried different treatments and also complains of bloody deformity left over right that is been gradually making it harder to walk and she wants to get it fixed.  Patient smokes a pack per day but does not drink   Review of Systems  All other systems reviewed and are negative.       Objective:  Physical Exam  Constitutional: She appears well-developed and well-nourished.  Cardiovascular: Intact distal pulses.  Pulmonary/Chest: Effort normal.  Musculoskeletal: Normal range of motion.  Neurological: She is alert.  Skin: Skin is warm.  Nursing note and vitals reviewed.   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  I explained the procedure and risk for the right foot patient wants surgery and today I infiltrated the right hallux 60 mg like Marcaine mixture removed the border exposed matrix and applied phenol 3 applications 30 seconds followed by alcohol lavage sterile dressing and gave instructions to leave dressing on  24 hours and take it off earlier if needed.  Also placed on Medrol Dosepak for 6 days and to be followed by a antifungal and dispensed Corticosporin otic solution to keep stress off the joint and off the nail.  Patient wants surgery on the left foot and we will do this in January and she will reappoint for consult  X-rays indicate that there is elevation of the intermetatarsal angle of the right

## 2018-07-28 ENCOUNTER — Ambulatory Visit: Payer: Commercial Managed Care - PPO | Admitting: Podiatry

## 2018-08-25 ENCOUNTER — Telehealth: Payer: Self-pay | Admitting: Podiatry

## 2018-08-25 NOTE — Telephone Encounter (Signed)
I tried calling pt to see if she wanted to schedule an appointment to come in for a surgery consult. I received the following message: "Welcome to [facility]. We're sorry, the number you have dialed has calling restrictions that have presented the completion of your call. announcement 42."

## 2018-08-28 NOTE — Telephone Encounter (Signed)
I attempted to call the patient on the listed phone number that we have.  I message came on the said she has restrictions on her phone.  I called and spoke to her husband Vincente Liberty and he asked me to call her at this number, [phone].  I am calling you in regards to your surgery that's scheduled for January 14.  You missed your follow-up appointment with Dr. Paulla Dolly that was scheduled for December 6.  "I just got a new job.  I'm going to have to push that back to February.  I will call you back in the morning and let you know when I can do it.  I can't afford to be off right now after being promoted." Give me a call tomorrow and let me know of your decision.

## 2018-08-29 IMAGING — CT CT ABD-PELV W/ CM
2 of 5 series · 16 of 46 positions shown, 18 images · IV contrast (iopamidol)
Comparison: 10/19/2016

CLINICAL DATA: 46-year-old with constipation and left lower
quadrant pain. History of Crohn's disease.

EXAM:
CT ABDOMEN AND PELVIS WITH CONTRAST
TECHNIQUE: Multidetector CT imaging of the abdomen and pelvis was performed
using the standard protocol following bolus administration of
intravenous contrast.
CONTRAST:  100mL FH8KYZ-LLL IOPAMIDOL (FH8KYZ-LLL) INJECTION 61%

[Series 3: a/p w/ 5mm · axial · 0.98mm/px · z∈[+380,+780]mm · 13 of 92 slices shown, 15 images]
[im 6/92  soft-tissue]
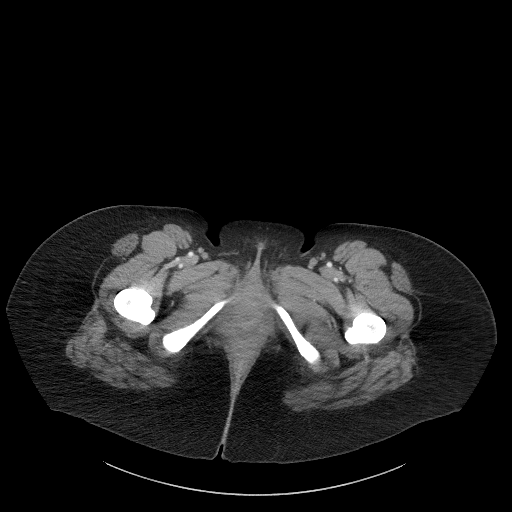
[im 6/92  bone]
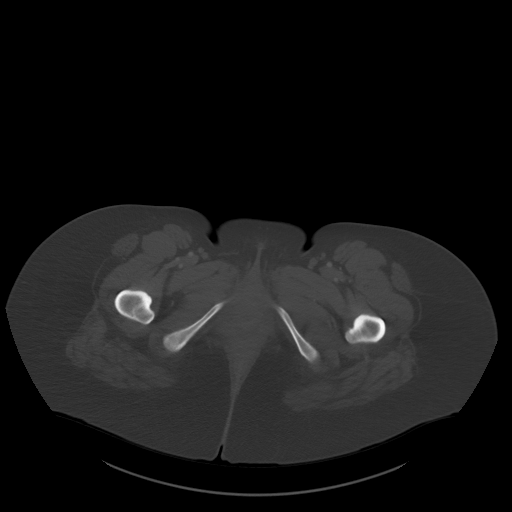
[im 11/92  soft-tissue]
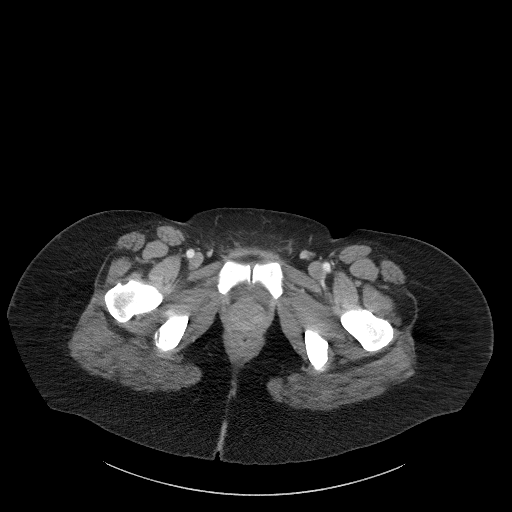
[im 21/92  soft-tissue]
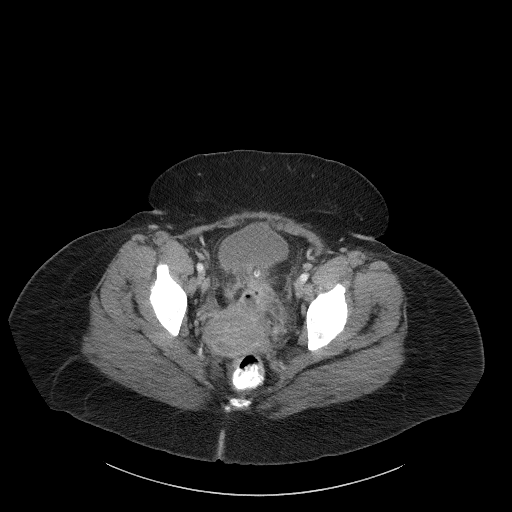
[im 26/92  soft-tissue]
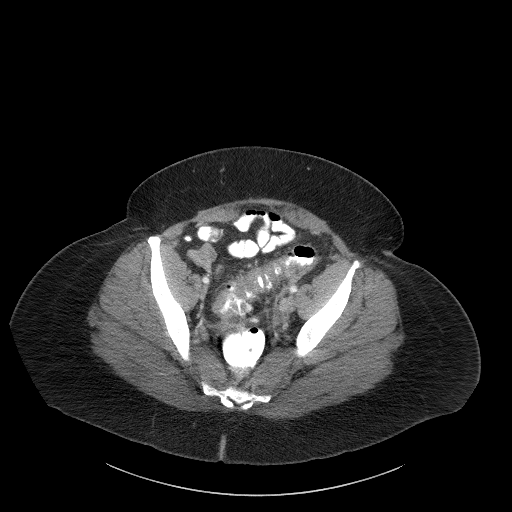
[im 31/92  soft-tissue]
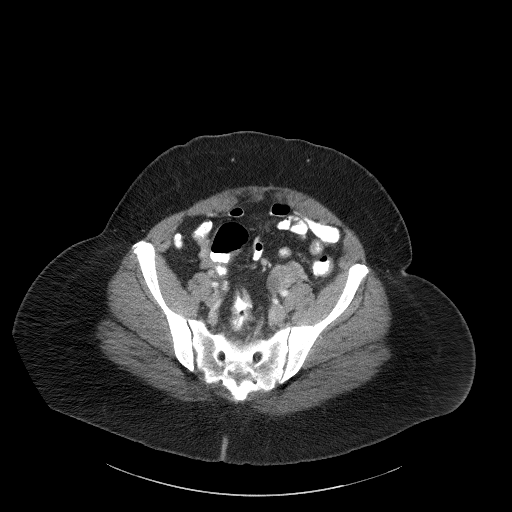
[im 41/92  soft-tissue]
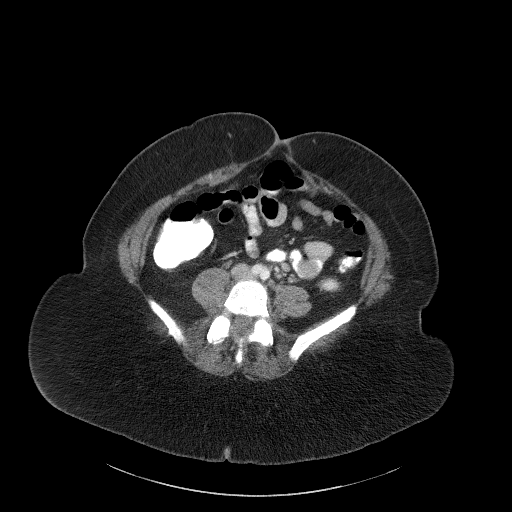
[im 46/92  soft-tissue]
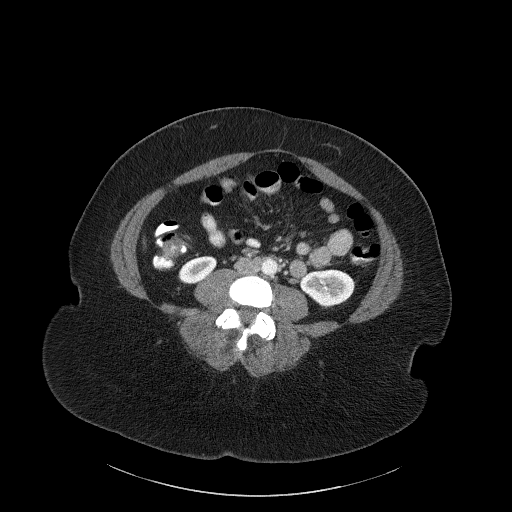
[im 51/92  soft-tissue]
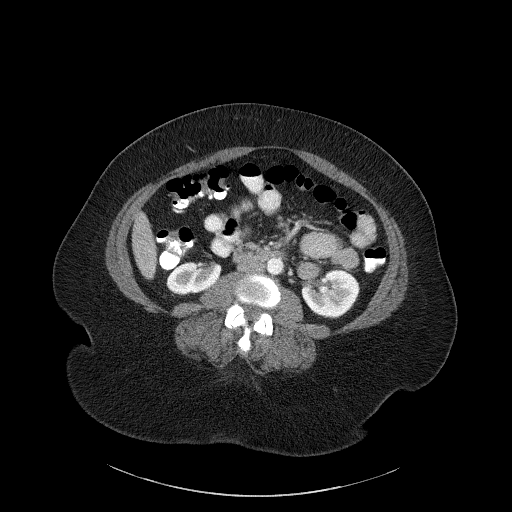
[im 61/92  soft-tissue]
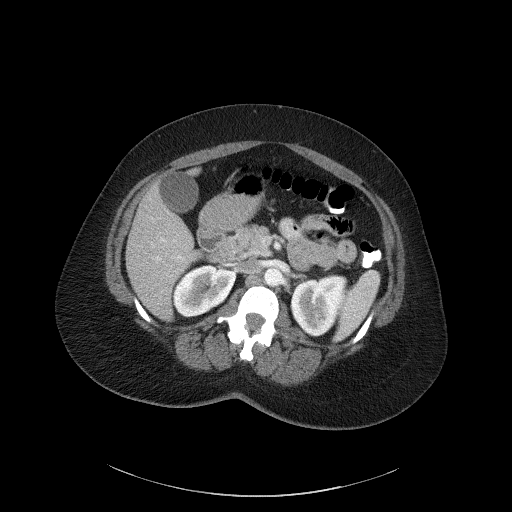
[im 61/92  bone]
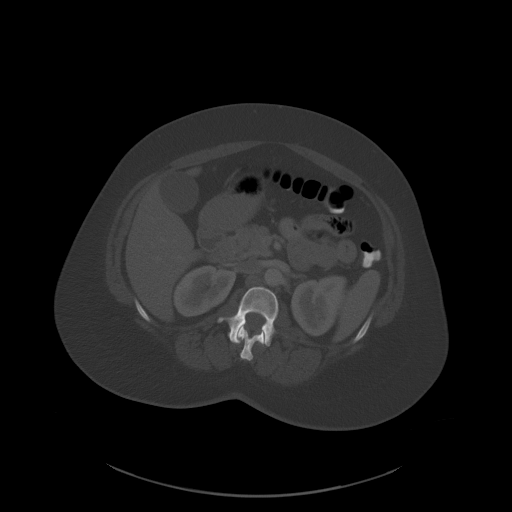
[im 66/92  soft-tissue]
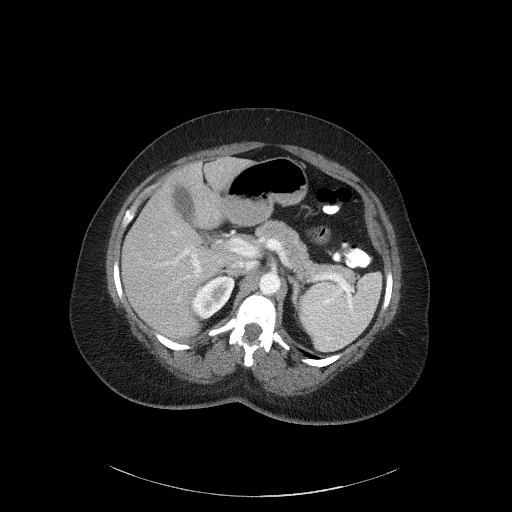
[im 71/92  soft-tissue]
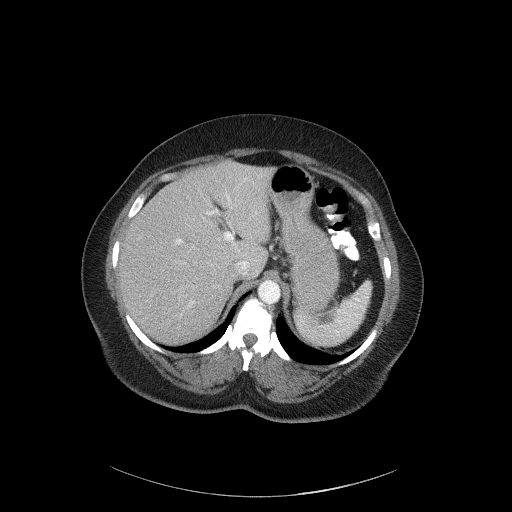
[im 81/92  soft-tissue]
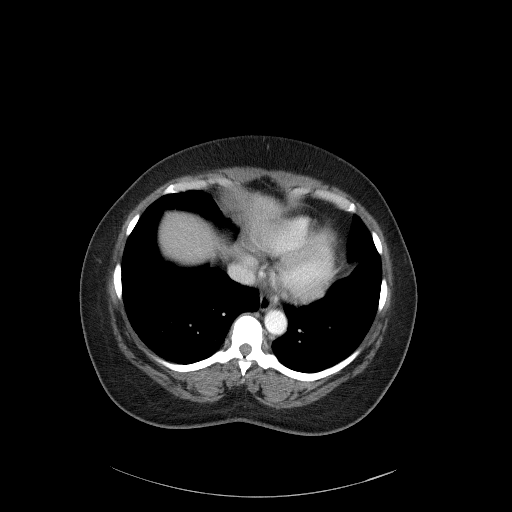
[im 86/92  soft-tissue]
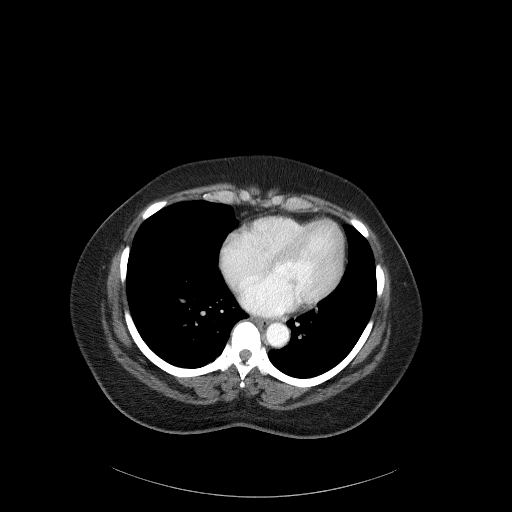

[Series 5: a/p w/ cor · coronal · 0.87mm/px · 3 of 184 slices shown]
[im 62/184  soft-tissue]
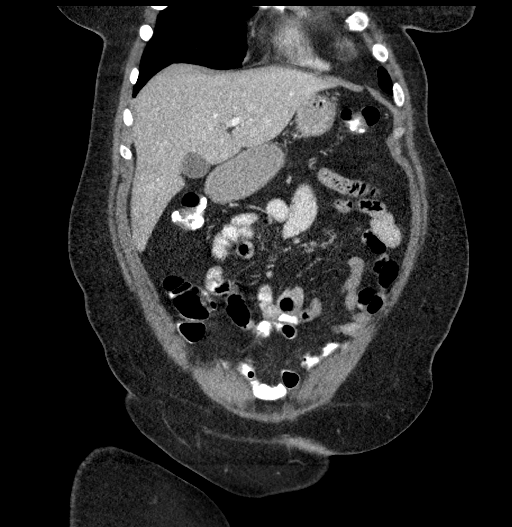
[im 82/184  soft-tissue]
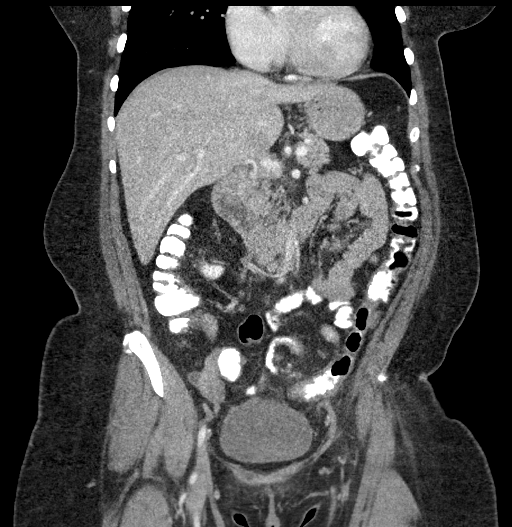
[im 102/184  soft-tissue]
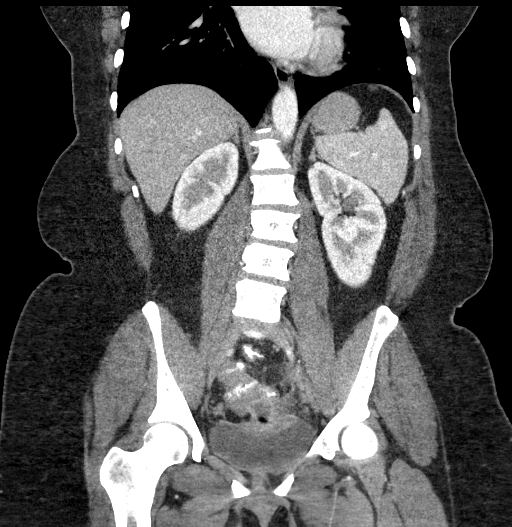

[16 of 46 positions shown; findings below may reference images not displayed]

FINDINGS: Lower chest: Lung bases are clear.

Hepatobiliary: Decreased attenuation of the liver is compatible with
steatosis. No focal liver lesion. No significant biliary dilatation.
Portal venous system appears to be patent. Normal appearance of the
gallbladder.

Pancreas: Normal appearance of the pancreas without inflammation or
duct dilatation.

Spleen: Normal appearance of spleen without enlargement.

Adrenals/Urinary Tract: Normal adrenal glands. No suspicious renal
lesions and no hydronephrosis.

Stomach/Bowel: Normal appearance of stomach and duodenum. Normal
appearance of small bowel without dilatation. There is a extensive
diverticulosis involving the sigmoid colon with wall thickening
along the posterior wall of the sigmoid colon on sequence 3, image
69. Adjacent to this wall thickening, there is a small air-fluid
collection compatible with a pericolonic abscess. This abscess
measures 2.5 x 2.2 x 2.6 cm. Mild stranding along the left side of
the pelvis adjacent to this small abscess. This abscess is
surrounded by the anterior aspect of the uterus, posterior dome of
the bladder and the posterior wall of the sigmoid colon. There is no
definite gas within the uterus or urinary bladder. There is oral
contrast within the sigmoid colon but there is no contrast within
this small abscess cavity. Normal appearance of the appendix.

Vascular/Lymphatic: No significant atherosclerotic disease in the
abdomen or pelvis. Again noted is a prominent lymph node in the
right posterior mesentery on sequence 3, image 49 measuring 1.1 cm
in short axis and stable. Overall, there is not significant lymph
node enlargement the abdomen and pelvis. Few prominent left
periaortic lymph nodes.

Reproductive: Small air-fluid collection just anterior to the uterus
is compatible with an abscess. There is no definite gas tracking
into the uterus itself. Left ovary is slightly prominent but stable.
Normal appearance of the right adnexa.

Other: No significant free fluid.  No free intraperitoneal air.

Musculoskeletal: No acute abnormality.
IMPRESSION: Sigmoid colon diverticulitis with a pericolonic abscess. The abscess
is located within the central aspect of the pelvis and adjacent to
the anterior wall of the uterus, posterior bladder dome and the
posterior wall of the sigmoid colon. Abscess measures up to 2.6 cm.
This small abscess is not amendable to percutaneous drainage.

Hepatic steatosis.

## 2018-11-06 ENCOUNTER — Other Ambulatory Visit: Payer: Self-pay

## 2018-11-06 ENCOUNTER — Encounter (HOSPITAL_COMMUNITY): Payer: Self-pay | Admitting: Emergency Medicine

## 2018-11-06 ENCOUNTER — Ambulatory Visit (HOSPITAL_COMMUNITY)
Admission: EM | Admit: 2018-11-06 | Discharge: 2018-11-06 | Disposition: A | Discharge status: Home / Self Care | Payer: Commercial Managed Care - PPO | Attending: Family Medicine | Admitting: Family Medicine

## 2018-11-06 DIAGNOSIS — H00014 Hordeolum externum left upper eyelid: Secondary | ICD-10-CM | POA: Diagnosis not present

## 2018-11-06 MED ORDER — CEPHALEXIN 500 MG PO CAPS: 500.0000 mg | ORAL_CAPSULE | Freq: Four times a day (QID) | ORAL | 0 refills | Status: AC

## 2018-11-06 NOTE — ED Provider Notes (Signed)
[place]    CSN: [id number] Arrival date & time: 11/06/18  1119     History   Chief Complaint Chief Complaint  Patient presents with  . Facial Swelling    HPI Amanda Garner is a 48 y.o. female history of Crohn's, GERD, presenting today for evaluation of left eyelid swelling and pain.  Symptoms began on Saturday, and have persisted for the past 3 days.  She has had decreased vision due to swelling.  She denies contact use.  Denies associated URI symptoms of congestion, cough or sore throat.  She has had some drainage out of this eye.  Denies symptoms on right.  Denies history of styes.  Does note that she recently had her eyelashes done and removed today's a day or 2 prior to onset of symptoms.  HPI  Past Medical History:  Diagnosis Date  . Acid reflux   . Crohn's disease ([place])     There are no active problems to display for this patient.   Past Surgical History:  Procedure Laterality Date  . CESAREAN SECTION      OB History   No obstetric history on file.      Home Medications    Prior to Admission medications   Medication Sig Start Date End Date Taking? Authorizing Provider  cephALEXin (KEFLEX) 500 MG capsule Take 1 capsule (500 mg total) by mouth 4 (four) times daily for 7 days. 11/06/18 11/13/18  Wieters, Hallie C, PA-C  clotrimazole (LOTRIMIN) 1 % cream Apply to affected area 2 times daily 07/10/18   Jola Schmidt, MD  methylPREDNISolone (MEDROL DOSEPAK) 4 MG TBPK tablet follow package directions Patient not taking: Reported on 11/06/2018 07/14/18   Wallene Huh, DPM  neomycin-polymyxin-hydrocortisone (CORTISPORIN) OTIC solution Apply 1-2 drops to toe after soaking BID 07/14/18   Wallene Huh, DPM  neomycin-polymyxin-hydrocortisone (CORTISPORIN) OTIC solution Apply 1-2 drops to toe after soaking twice a day 07/14/18   Wallene Huh, DPM  omeprazole (PRILOSEC) 20 MG capsule Take 1 capsule (20 mg total) by mouth daily. 06/05/18   Montine Circle, PA-C  sucralfate (CARAFATE) 1 g tablet Take 1 tablet (1 g total) by mouth 4 (four) times daily -  with meals and at bedtime. 06/05/18   Montine Circle, PA-C  terbinafine (LAMISIL) 250 MG tablet Take 1 tablet (250 mg total) by mouth daily. 07/14/18   Wallene Huh, DPM    Family History Family History  Problem Relation Age of Onset  . Hypertension Mother   . Diabetes Mother     Social History Social History   Tobacco Use  . Smoking status: Current Every Day Smoker    Packs/day: 1.00  . Smokeless tobacco: Never Used  Substance Use Topics  . Alcohol use: Yes    Comment: occassionally  . Drug use: Never     Allergies   Patient has no known allergies.   Review of Systems Review of Systems  Constitutional: Negative for activity change, appetite change, chills, fatigue and fever.  HENT: Negative for congestion, ear pain, mouth sores, rhinorrhea, sinus pressure, sore throat and trouble swallowing.   Eyes: Positive for pain, discharge and visual disturbance. Negative for photophobia and redness.  Respiratory: Negative for cough, chest tightness and shortness of breath.   Cardiovascular: Negative for chest pain.  Gastrointestinal: Negative for abdominal pain, diarrhea, nausea and vomiting.  Genitourinary: Negative for genital sores.  Musculoskeletal: Negative for arthralgias, joint swelling and myalgias.  Skin: Positive for color change and rash. Negative  for wound.  Neurological: Negative for dizziness, weakness, light-headedness and headaches.     Physical Exam Triage Vital Signs ED Triage Vitals [11/06/18 1206]  Enc Vitals Group     BP (!) 172/103     Pulse Rate 96     Resp 18     Temp (!) 97.2 F (36.2 C)     Temp Source Temporal     SpO2 98 %     Weight      Height      Head Circumference      Peak Flow      Pain Score 10     Pain Loc      Pain Edu?      Excl. in [place]?    No data found.  Updated Vital Signs BP (!) 172/103 (BP Location: Right  Arm)   Pulse 96   Temp (!) 97.2 F (36.2 C) (Temporal)   Resp 18   SpO2 98%   Visual Acuity Right Eye Distance:   Left Eye Distance:   Bilateral Distance:    Right Eye Near:   Left Eye Near:    Bilateral Near:     Physical Exam Vitals signs and nursing note reviewed.  Constitutional:      General: She is not in acute distress.    Appearance: She is well-developed.  HENT:     Head: Normocephalic and atraumatic.     Ears:     Comments: Bilateral ears without tenderness to palpation of external auricle, tragus and mastoid, EAC's without erythema or swelling, TM's with good bony landmarks and cone of light. Non erythematous.    Mouth/Throat:     Comments: Oral mucosa pink and moist, no tonsillar enlargement or exudate. Posterior pharynx patent and nonerythematous, no uvula deviation or swelling. Normal phonation.  Eyes:     Extraocular Movements: Extraocular movements intact.     Conjunctiva/sclera: Conjunctivae normal.     Pupils: Pupils are equal, round, and reactive to light.     Comments: Left upper eyelid with swelling, erythema and tenderness, conjunctiva without erythema, limbus clear, visible thick drainage present on lashes  Neck:     Musculoskeletal: Neck supple.  Cardiovascular:     Rate and Rhythm: Normal rate and regular rhythm.     Heart sounds: No murmur.  Pulmonary:     Effort: Pulmonary effort is normal. No respiratory distress.     Breath sounds: Normal breath sounds.  Abdominal:     Palpations: Abdomen is soft.     Tenderness: There is no abdominal tenderness.  Skin:    General: Skin is warm and dry.  Neurological:     Mental Status: She is alert.      UC Treatments / Results  Labs (all labs ordered are listed, but only abnormal results are displayed) Labs Reviewed - No data to display  EKG None  Radiology No results found.  Procedures Procedures (including critical care time)  Medications Ordered in UC Medications - No data to display   Initial Impression / Assessment and Plan / UC Course  I have reviewed the triage vital signs and the nursing notes.  Pertinent labs & imaging results that were available during my care of the patient were reviewed by me and considered in my medical decision making (see chart for details).     Patient with left hordeolum/stye, will initiate on Keflex, lid scrubs and warm compresses.  Continue to monitor swelling. Discussed strict return precautions. Patient verbalized understanding and is  agreeable with plan.  Final Clinical Impressions(s) / UC Diagnoses   Final diagnoses:  Hordeolum externum of left upper eyelid     Discharge Instructions     Stye  -Warm compresses multiple times a day with massage afterward  - Do not wear contacts or make up  - Keep lid clean, may use baby shampoo  - Keflex 4 times a day for 1 week - Return if increasing, redness, pain or swelling, decreased vision.    ED Prescriptions    Medication Sig Dispense Auth. Provider   cephALEXin (KEFLEX) 500 MG capsule Take 1 capsule (500 mg total) by mouth 4 (four) times daily for 7 days. 28 capsule Wieters, Hallie C, PA-C     Controlled Substance Prescriptions [place] Controlled Substance Registry consulted? Not Applicable   Janith Lima, [place] 11/06/18 1410

## 2018-11-06 NOTE — Discharge Instructions (Signed)
Stye  -Warm compresses multiple times a day with massage afterward  - Do not wear contacts or make up  - Keep lid clean, may use baby shampoo  - Keflex 4 times a day for 1 week - Return if increasing, redness, pain or swelling, decreased vision.

## 2018-11-06 NOTE — ED Triage Notes (Signed)
Pt here for left eye swelling to upper eye lid with pain x 3 days

## 2018-12-20 ENCOUNTER — Other Ambulatory Visit: Payer: Self-pay

## 2018-12-20 ENCOUNTER — Telehealth (INDEPENDENT_AMBULATORY_CARE_PROVIDER_SITE_OTHER): Payer: Commercial Managed Care - PPO | Admitting: Family Medicine

## 2018-12-20 DIAGNOSIS — Z20828 Contact with and (suspected) exposure to other viral communicable diseases: Secondary | ICD-10-CM | POA: Diagnosis not present

## 2018-12-20 DIAGNOSIS — Z20822 Contact with and (suspected) exposure to covid-19: Secondary | ICD-10-CM

## 2018-12-20 NOTE — Patient Instructions (Signed)
° ° ° °  If you have lab work done today you will be contacted with your lab results within the next 2 weeks.  If you have not heard from us then please contact us. The fastest way to get your results is to register for My Chart. ° ° °IF you received an x-ray today, you will receive an invoice from [facility]. Please contact [facility] at [phone] with questions or concerns regarding your invoice.  ° °IF you received labwork today, you will receive an invoice from [facility]. Please contact [facility] at [phone] with questions or concerns regarding your invoice.  ° °Our billing staff will not be able to assist you with questions regarding bills from these companies. ° °You will be contacted with the lab results as soon as they are available. The fastest way to get your results is to activate your My Chart account. Instructions are located on the last page of this paperwork. If you have not heard from us regarding the results in 2 weeks, please contact this office. °  ° ° ° °

## 2018-12-20 NOTE — Progress Notes (Signed)
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 Patient do not have any symptoms other then wheezing. Been wheezing for 2 weeks now. Denies cough or fever. Past medical records from [facility] states patient has no hx of  lung problem or any lung disease.

## 2018-12-20 NOTE — Progress Notes (Signed)
Telemedicine Encounter- SOAP NOTE Established Patient  I discussed the limitations, risks, security and privacy concerns of performing an evaluation and management service by telephone and the availability of in person appointments. I also discussed with the patient that there may be a patient responsible charge related to this service. The patient expressed understanding and agreed to proceed.  This telephone encounter was conducted with the patient's verbal consent via audio telecommunications:yes Patient was instructed to have this encounter in a suitably private space; and to only have persons present to whom they give permission to participate. In addition, patient identity was confirmed by use of name plus two identifiers (DOB and address).  I spent a total of 23mn talking with the patient .   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 Patient do not have any symptoms other then wheezing. Been wheezing for 2 weeks now. Denies cough or fever. Past medical records from [facility] states patient has no hx of  lung problem or any lung disease.  Subjective   Amanda Maximis a 48y.o. female established patient. Telephone visit today for COVID exposure.  HPI Pt had an outbreak of COVID at pts workplace-COVID-+ [employer]-notified that pt exposed 12/14/18-unclear how close exposure at workplace pts mother a patient of Dr. 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 Pt denies any current symptoms. Pt has been isolating n her home since notification. 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 Acid reflux   . Crohn's disease ([place]     No current outpatient medications on file.   No current facility-administered medications for this visit.     No Known Allergies  Social History   Socioeconomic History  .  Marital status: Married    Spouse name: Not on file  . Number of children: Not on file  . Years of education: Not on file  . Highest education level: Not on file  Occupational History  . Not on file  Social Needs  . Financial resource strain: Not on file  . Food insecurity:    Worry: Not on file    Inability: Not on file  . Transportation needs:    Medical: Not on file    Non-medical: Not on file  Tobacco Use  . Smoking status: Current Every Day Smoker    Packs/day: 1.00  . Smokeless tobacco: Never Used  Substance and Sexual Activity  . Alcohol use: Yes    Comment: occassionally  . Drug use: Never  . Sexual activity: Not on file  Lifestyle  . Physical activity:    Days per week: Not on file    Minutes per session: Not on file  . Stress: Not on file  Relationships  . Social connections:    Talks on phone: Not on file    Gets together: Not on file    Attends religious service: Not on file    Active member of club or organization: Not on file    Attends meetings of clubs or organizations: Not on file    Relationship status: Not on file  . Intimate partner violence:    Fear of current or ex partner: Not on file    Emotionally abused: Not on file    Physically abused: Not on file  Forced sexual activity: Not on file  Other Topics Concern  . Not on file  Social History Narrative  . Not on file    ROS CONSTITUTIONAL: no  fever, EENT:nosinus problems,no, sore throat CV: no chest pain RESP: no SOB, no cough GI: no , diarrhea,  enlargement ALLERGY:seasonal allergies, hay fever, itching    Objective   Vitals as reported by the patient: none 1. Exposure to Covid-19 Virus 14 DAY ISOLATION-PTS MOTHER AT RISK-PT PRIMARY CARE GIVER WORK NOTE I discussed the assessment and treatment plan with the patient. The patient was provided an opportunity to ask questions and all were answered. The patient agreed with the plan and demonstrated an understanding of the  instructions.   The patient was advised to call back or seek an in-person evaluation if the symptoms worsen or if the condition fails to improve as anticipated.  I provided 15 minutes of non-face-to-face time during this encounter.  LISA Hannah Beat, MD  Primary Care at [facility] 12-20-18

## 2018-12-22 ENCOUNTER — Telehealth: Payer: Self-pay | Admitting: Family Medicine

## 2018-12-22 NOTE — Telephone Encounter (Signed)
Please approve letter extension.

## 2018-12-22 NOTE — Telephone Encounter (Signed)
Copied from [place] [id number]. Topic: General - Other >> Dec 22, 2018 10:31 AM Leward Quan A wrote: Reason for CRM: Patient called about letter that was written on 12/20/2018 asking for it to be re written please. She is asking for it to add that( due to 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 She is asking for this letter to be faxed to Audley Hose at Fax# [phone]

## 2018-12-25 ENCOUNTER — Telehealth: Payer: Self-pay | Admitting: Family Medicine

## 2018-12-25 NOTE — Telephone Encounter (Signed)
Copied from [place] [id number]. Topic: General - Other >> Dec 22, 2018 10:31 AM Leward Quan A wrote: Reason for CRM: Patient called about letter that was written on 12/20/2018 asking for it to be re written please. She is asking for it to add that( due to 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 She is asking for this letter to be faxed to Audley Hose at Fax# [phone] >> Dec 25, 2018  8:53 AM Leward Quan A wrote: Patient called back to inquire about the updated letter for her HR department. Without this updated letter patient will not be paid for her time out of work. She is asking can this be done today if possible

## 2018-12-25 NOTE — Telephone Encounter (Signed)
Letter printed and faxed today

## 2018-12-26 NOTE — Telephone Encounter (Signed)
Paperwork has been completed by Dr. Holly Bodily and ready for pick-up. Pt has been informed and copies have been made.

## 2018-12-26 NOTE — Telephone Encounter (Signed)
Pt dropped off FMLA form and paid $15 fee. Given to [place].

## 2018-12-28 NOTE — Telephone Encounter (Signed)
Patient called and said that her HR has not received the fax. Please resubmite to number  [phone], attention [employer]ley Hose.

## 2018-12-28 NOTE — Telephone Encounter (Signed)
Faxed again successful

## 2018-12-29 NOTE — Telephone Encounter (Signed)
Spoke with pt this morning about letter request and informed her that the provider has filled out FMLA paper work describing why she was out of work in detail. She is going to check with employer to see if she still needs a sepreate letter and will give [place] a  Call back if needed.

## 2018-12-29 NOTE — Telephone Encounter (Signed)
Patient called and stated that we sent the same letter over twice. Pt states that the letter need to be reworded so that she can get paid. Pt states that pay role goes in tomorrow and would like this done today. Please advise

## 2019-01-17 ENCOUNTER — Telehealth: Payer: Self-pay | Admitting: Family Medicine

## 2019-01-17 NOTE — Telephone Encounter (Signed)
This was handled very well and appropriately documented.  Thank you.

## 2019-01-17 NOTE — Telephone Encounter (Signed)
Copied from [place] [id number]. Topic: General - Other >> Jan 17, 2019  9:02 AM Virl Axe D wrote: Reason for CRM: Pt stated there has been another outbreak of Covid-19 at her work place. She is going to be self quarantining for 14 days and needs a letter to give to her employer from her PCP stating such and also due to the fact that she has an 49 year old mother in the home. Please advise. CB#[phone]

## 2019-01-17 NOTE — Telephone Encounter (Signed)
Spoke with pt and informed her that at this point if she is not having any symptoms she would need to schedule an appointment with the PCP and they would determine if she can get a work note. Also informed the pat that since she has no symptoms she does not meet the criteria for testing. She verbalized understanding.  Spoke with the PCP and she informed me that if she has no symptoms at this time a note can not be issued because with phase 2 that's is the new recommendations.

## 2019-01-18 ENCOUNTER — Telehealth: Payer: Commercial Managed Care - PPO | Admitting: Emergency Medicine

## 2019-02-13 NOTE — Telephone Encounter (Signed)
Patient states that paperwork is needing to be faxed to HR department for her employer.   Fax# [phone] Attn:  Audley Hose

## 2019-02-13 NOTE — Telephone Encounter (Signed)
Tried to call pt with no answer will try again later.

## 2019-02-15 ENCOUNTER — Telehealth: Payer: Self-pay | Admitting: Family Medicine

## 2019-02-15 NOTE — Telephone Encounter (Signed)
Patient says Dr. Nolon Rod was supposed to complete FMLA paperwork for her to recertify it. See her TE in her moms chart, Nettie DOB 11-06-1943. Dr. Holly Bodily completed these forms by mistake and these need to be completed by [facility]. Patient $15 for this to be completed. She needs FMLA to be able to take care of her mother at home. Please advise.

## 2019-02-16 ENCOUNTER — Encounter: Payer: Self-pay | Admitting: Gastroenterology

## 2019-02-16 ENCOUNTER — Telehealth: Payer: Self-pay | Admitting: Family Medicine

## 2019-02-16 NOTE — Telephone Encounter (Signed)
Copied from [place] [phone]. Topic: General - Inquiry >> Feb 16, 2019 10:14 AM Rainey Pines A wrote: Patient requests a callback  at [phone] in regards to error on her FMLA paperwork.

## 2019-02-19 NOTE — Telephone Encounter (Signed)
Spoke with pt and she states she will have another copy of paper work sent to office for Dr. Kaleen Mask to complete. I advised her that I would try to explain  the situation to her about her FMLA, she verbalized understanding.

## 2019-03-12 ENCOUNTER — Other Ambulatory Visit: Payer: Self-pay

## 2019-03-12 ENCOUNTER — Ambulatory Visit (INDEPENDENT_AMBULATORY_CARE_PROVIDER_SITE_OTHER): Payer: Commercial Managed Care - PPO

## 2019-03-12 ENCOUNTER — Ambulatory Visit (HOSPITAL_COMMUNITY)
Admission: EM | Admit: 2019-03-12 | Discharge: 2019-03-12 | Disposition: A | Discharge status: Home / Self Care | Payer: Commercial Managed Care - PPO | Attending: Family Medicine | Admitting: Family Medicine

## 2019-03-12 ENCOUNTER — Encounter (HOSPITAL_COMMUNITY): Payer: Self-pay

## 2019-03-12 DIAGNOSIS — R062 Wheezing: Secondary | ICD-10-CM

## 2019-03-12 DIAGNOSIS — R0602 Shortness of breath: Secondary | ICD-10-CM

## 2019-03-12 DIAGNOSIS — I10 Essential (primary) hypertension: Secondary | ICD-10-CM

## 2019-03-12 DIAGNOSIS — R05 Cough: Secondary | ICD-10-CM

## 2019-03-12 DIAGNOSIS — Z833 Family history of diabetes mellitus: Secondary | ICD-10-CM | POA: Insufficient documentation

## 2019-03-12 DIAGNOSIS — Z8249 Family history of ischemic heart disease and other diseases of the circulatory system: Secondary | ICD-10-CM | POA: Insufficient documentation

## 2019-03-12 DIAGNOSIS — F17210 Nicotine dependence, cigarettes, uncomplicated: Secondary | ICD-10-CM | POA: Insufficient documentation

## 2019-03-12 DIAGNOSIS — Z801 Family history of malignant neoplasm of trachea, bronchus and lung: Secondary | ICD-10-CM | POA: Insufficient documentation

## 2019-03-12 DIAGNOSIS — R0982 Postnasal drip: Secondary | ICD-10-CM | POA: Insufficient documentation

## 2019-03-12 DIAGNOSIS — R0981 Nasal congestion: Secondary | ICD-10-CM | POA: Insufficient documentation

## 2019-03-12 DIAGNOSIS — Z20828 Contact with and (suspected) exposure to other viral communicable diseases: Secondary | ICD-10-CM | POA: Diagnosis not present

## 2019-03-12 DIAGNOSIS — K5090 Crohn's disease, unspecified, without complications: Secondary | ICD-10-CM | POA: Diagnosis not present

## 2019-03-12 DIAGNOSIS — J029 Acute pharyngitis, unspecified: Secondary | ICD-10-CM | POA: Insufficient documentation

## 2019-03-12 DIAGNOSIS — R059 Cough, unspecified: Secondary | ICD-10-CM

## 2019-03-12 MED ORDER — PREDNISONE 10 MG (21) PO TBPK: ORAL_TABLET | Freq: Every day | ORAL | 0 refills | Status: DC

## 2019-03-12 MED ORDER — AMLODIPINE BESYLATE 5 MG PO TABS: 5.0000 mg | ORAL_TABLET | Freq: Every day | ORAL | 2 refills | Status: DC

## 2019-03-12 NOTE — ED Provider Notes (Signed)
[facility]   [id number] 03/12/19 Arrival Time: 4098  ASSESSMENT & PLAN:  1. Cough   2. SOB (shortness of breath)   3. Wheezing   4. Essential hypertension     Pending: Orders Placed This Encounter  Procedures  . Novel Coronavirus, NAA (hospital order; send-out to ref lab)   Work note given. To self-quarantine until COVID-19 results are available.  I have personally viewed the imaging studies ordered this visit. No infiltrates.  Meds ordered this encounter  Medications  . predniSONE (STERAPRED UNI-PAK 21 TAB) 10 MG (21) TBPK tablet    Sig: Take by mouth daily. Take as directed.    Dispense:  21 tablet    Refill:  0  . amLODipine (NORVASC) 5 MG tablet    Sig: Take 1 tablet (5 mg total) by mouth daily.    Dispense:  30 tablet    Refill:  2   Requets refill of BP medication.  OTC symptom care as needed. Ensure adequate fluid intake and rest. May f/u with PCP or here as needed.  Reviewed expectations re: course of current medical issues. Questions answered. Outlined signs and symptoms indicating need for more acute intervention. Patient verbalized understanding. After Visit Summary given.   SUBJECTIVE: History from: patient.  Amanda Garner is a 48 y.o. female who presents with complaint of nasal congestion, post-nasal drainage, and a persistent dry cough; with a mild sore throat. Gradual onset of symptoms, between one and two weeks ago; with fatigue and with mild body aches. SOB: 'kind of, when I walk a little ways'; better with rest. Wheezing: does think so when she feels SOB. Fever: no. Overall normal PO intake without n/v. Known sick contacts: none. No specific or significant aggravating or alleviating factors reported. OTC treatment: 'cold medicines' without much relief.  Social History   Tobacco Use  Smoking Status Current Every Day Smoker  . Packs/day: 0.50  . Years: 31.00  . Pack years: 15.50  . Types: Cigarettes  Smokeless Tobacco Never  Used   Increased blood pressure noted today. Reports that she has been treated for hypertension in the past. Needs refill of BP medication.  She reports no chest pain on exertion, no dyspnea on exertion, no swelling of ankles, no orthostatic dizziness or lightheadedness, no orthopnea or paroxysmal nocturnal dyspnea, no palpitations and no intermittent claudication symptoms.  ROS: As per HPI. All other systems negative.   OBJECTIVE:  Vitals:   03/12/19 1222  BP: (!) 152/108  Pulse: 84  Resp: 17  Temp: 98.3 F (36.8 C)  TempSrc: Oral  SpO2: 97%     General appearance: alert; appears fatigued HEENT: nasal congestion; clear runny nose; throat irritation secondary to post-nasal drainage Neck: supple without LAD CV: RRR Lungs: unlabored respirations, symmetrical air entry with mild bilateral wheezing; cough: mild and dry Ext: no LE edema Skin: warm and dry Psychological: alert and cooperative; normal mood and affect  Imaging: Dg Chest 2 View  Result Date: 03/12/2019 CLINICAL DATA:  Cough, shortness of breath EXAM: CHEST - 2 VIEW COMPARISON:  06/05/2018 FINDINGS: The heart size and mediastinal contours are within normal limits. Both lungs are clear. The visualized skeletal structures are unremarkable. IMPRESSION: No active cardiopulmonary disease. Electronically Signed   By: Davina Poke M.D.   On: 03/12/2019 13:00    No Known Allergies  Past Medical History:  Diagnosis Date  . Acid reflux   . Anemia Dx: 2001 or so   . Blood transfusion   . Crohn disease (  [place])   . Crohn's disease ([place])   . Diverticulitis   . Hypertension   . Sickle cell trait ([facility])    Family History  Problem Relation Age of Onset  . Hypertension Mother   . Diabetes Mother   . Diabetes type II Mother   . Diabetes type II Father   . Stroke Father   . Lung cancer Father 13  . Colon cancer Neg Hx    Social History   Socioeconomic History  . Marital status: Married    Spouse name: Not on file   . Number of children: 3  . Years of education: Not on file  . Highest education level: Not on file  Occupational History  . Occupation: Public house manager: [employer]  . Financial resource strain: Not on file  . Food insecurity    Worry: Not on file    Inability: Not on file  . Transportation needs    Medical: Not on file    Non-medical: Not on file  Tobacco Use  . Smoking status: Current Every Day Smoker    Packs/day: 0.50    Years: 31.00    Pack years: 15.50    Types: Cigarettes  . Smokeless tobacco: Never Used  Substance and Sexual Activity  . Alcohol use: Yes    Comment: occassionally  . Drug use: Never  . Sexual activity: Yes    Partners: Male    Birth control/protection: Surgical  Lifestyle  . Physical activity    Days per week: Not on file    Minutes per session: Not on file  . Stress: Not on file  Relationships  . Social Herbalist on phone: Not on file    Gets together: Not on file    Attends religious service: Not on file    Active member of club or organization: Not on file    Attends meetings of clubs or organizations: Not on file    Relationship status: Not on file  . Intimate partner violence    Fear of current or ex partner: Not on file    Emotionally abused: Not on file    Physically abused: Not on file    Forced sexual activity: Not on file  Other Topics Concern  . Not on file  Social History Narrative   ** Merged History Encounter Vanessa Kick, MD 03/12/19 [phone]

## 2019-03-12 NOTE — Discharge Instructions (Signed)
You may use over the counter ibuprofen or acetaminophen as needed.  For a sore throat, over the counter products such as [employer] Peroxyl Mouth Sore Rinse or Chloraseptic Sore Throat Spray may provide some temporary relief.

## 2019-03-12 NOTE — ED Triage Notes (Signed)
Patient presents to Urgent Care with complaints of sore throat and congested sinuses since yesterday.

## 2019-03-14 LAB — NOVEL CORONAVIRUS, NAA (HOSP ORDER, SEND-OUT TO REF LAB; TAT 18-24 HRS): SARS-CoV-2, NAA: NOT DETECTED

## 2019-03-16 ENCOUNTER — Telehealth: Payer: Self-pay | Admitting: Hematology

## 2019-03-16 NOTE — Telephone Encounter (Signed)
Pt is aware covid 19 test is negative

## 2019-08-02 ENCOUNTER — Emergency Department (HOSPITAL_COMMUNITY)
Admission: EM | Admit: 2019-08-02 | Discharge: 2019-08-02 | Disposition: A | Discharge status: Home / Self Care | Payer: Commercial Managed Care - PPO | Attending: Emergency Medicine | Admitting: Emergency Medicine

## 2019-08-02 DIAGNOSIS — M79672 Pain in left foot: Secondary | ICD-10-CM | POA: Insufficient documentation

## 2019-08-02 DIAGNOSIS — I10 Essential (primary) hypertension: Secondary | ICD-10-CM | POA: Insufficient documentation

## 2019-08-02 DIAGNOSIS — B353 Tinea pedis: Secondary | ICD-10-CM

## 2019-08-02 DIAGNOSIS — F17210 Nicotine dependence, cigarettes, uncomplicated: Secondary | ICD-10-CM | POA: Insufficient documentation

## 2019-08-02 DIAGNOSIS — Z79899 Other long term (current) drug therapy: Secondary | ICD-10-CM | POA: Insufficient documentation

## 2019-08-02 DIAGNOSIS — D573 Sickle-cell trait: Secondary | ICD-10-CM | POA: Insufficient documentation

## 2019-08-02 LAB — COMPREHENSIVE METABOLIC PANEL
ALT: 14 U/L (ref 0–44)
AST: 17 U/L (ref 15–41)
Albumin: 3.7 g/dL (ref 3.5–5.0)
Alkaline Phosphatase: 58 U/L (ref 38–126)
Anion gap: 9 (ref 5–15)
BUN: 10 mg/dL (ref 6–20)
CO2: 23 mmol/L (ref 22–32)
Calcium: 9.1 mg/dL (ref 8.9–10.3)
Chloride: 109 mmol/L (ref 98–111)
Creatinine, Ser: 0.65 mg/dL (ref 0.44–1.00)
GFR calc Af Amer: >60 mL/min (ref >60)
GFR calc non Af Amer: >60 mL/min (ref >60)
Glucose, Bld: 128 mg/dL — ABNORMAL HIGH (ref 70–99)
Potassium: 3.9 mmol/L (ref 3.5–5.1)
Sodium: 141 mmol/L (ref 135–145)
Total Bilirubin: 0.5 mg/dL (ref 0.3–1.2)
Total Protein: 6.7 g/dL (ref 6.5–8.1)

## 2019-08-02 LAB — CBC WITH DIFFERENTIAL/PLATELET
Abs Immature Granulocytes: 0.03 10*3/uL (ref 0.00–0.07)
Basophils Absolute: 0 10*3/uL (ref 0.0–0.1)
Basophils Relative: 1 %
Eosinophils Absolute: 0.3 10*3/uL (ref 0.0–0.5)
Eosinophils Relative: 5 %
HCT: 41.6 % (ref 36.0–46.0)
Hemoglobin: 13 g/dL (ref 12.0–15.0)
Immature Granulocytes: 1 %
Lymphocytes Relative: 38 %
Lymphs Abs: 2.5 10*3/uL (ref 0.7–4.0)
MCH: 21.8 pg — ABNORMAL LOW (ref 26.0–34.0)
MCHC: 31.3 g/dL (ref 30.0–36.0)
MCV: 69.7 fL — ABNORMAL LOW (ref 80.0–100.0)
Monocytes Absolute: 0.5 10*3/uL (ref 0.1–1.0)
Monocytes Relative: 7 %
Neutro Abs: 3.3 10*3/uL (ref 1.7–7.7)
Neutrophils Relative %: 48 %
Platelets: 238 10*3/uL (ref 150–400)
RBC: 5.97 MIL/uL — ABNORMAL HIGH (ref 3.87–5.11)
RDW: 14.4 % (ref 11.5–15.5)
WBC: 6.7 10*3/uL (ref 4.0–10.5)
nRBC: 0 % (ref 0.0–0.2)

## 2019-08-02 LAB — I-STAT BETA HCG BLOOD, ED (MC, WL, AP ONLY): I-stat hCG, quantitative: <5 m[IU]/mL (ref <5)

## 2019-08-02 LAB — LACTIC ACID, PLASMA: Lactic Acid, Venous: 1.5 mmol/L (ref 0.5–1.9)

## 2019-08-02 MED ORDER — TERBINAFINE HCL 1 % EX CREA: 1.0000 "application " | TOPICAL_CREAM | Freq: Two times a day (BID) | CUTANEOUS | 0 refills | Status: AC

## 2019-08-02 MED ORDER — TERBINAFINE HCL 250 MG PO TABS: 250.0000 mg | ORAL_TABLET | Freq: Every day | ORAL | 0 refills | Status: AC

## 2019-08-02 NOTE — ED Triage Notes (Signed)
Pt complains of bilateral hand and foot swelling and states "I am broke out on the bottoms of both of my feet" Pt has been to several drs for this issue in the past and placed on antibiotics without relief. Unknown if diabetic.

## 2019-08-02 NOTE — ED Provider Notes (Signed)
[facility] EMERGENCY DEPARTMENT Provider Note   CSN: [id number] Arrival date & time: 08/02/19  1001     History Chief Complaint  Patient presents with  . Foot Pain    Amanda Garner is a 48 y.o. female.  48 y.o female with a PMH of HTN, tinea pedis of the left foot to the ED with bilateral foot pain for the past 2 years.  Patient was seen by multiple people such as urgent care, ED, podiatry.  She reports she was given a antifungal cream provided by the podiatrist, this did not help with her symptoms.  She has been unable to go back due to financial strain.  Patient is currently not followed by any PCP.  He describes the rash along with lesions on her foot to be pruritic in nature.  No bruising, erythema, blistering noted.  She denies any fever, she has been placing Vaseline to the area without improvement.  No swelling to bilateral ankles.  She is unaware of any history of diabetes.  The history is provided by the patient and medical records.       Past Medical History:  Diagnosis Date  . Acid reflux   . Anemia Dx: 2001 or so   . Blood transfusion   . Crohn disease ([place])   . Crohn's disease ([place])   . Diverticulitis   . Hypertension   . Sickle cell trait [facility])     Patient Active Problem List   Diagnosis Date Noted  . Diverticulitis of large intestine with abscess without bleeding 06/07/2017  . Dysuria   . Abscess of sigmoid colon due to diverticulitis 05/24/2017  . Sigmoid diverticulitis 05/11/2017  . Tobacco dependence 05/11/2017  . Marijuana abuse 05/11/2017  . Essential hypertension 05/11/2017  . Reflux esophagitis 05/11/2017  . Tinea pedis of left foot 04/11/2015  . Blister of foot with infection 04/11/2015  . Diverticulitis 09/07/2012  . C. difficile colitis 08/26/2011  . Abdominal pain, other specified site 08/26/2011  . Diarrhea 08/26/2011  . Abnormal finding on GI tract imaging 08/26/2011  . Enteritis 08/25/2011    Past Surgical  History:  Procedure Laterality Date  . ABDOMINAL HYSTERECTOMY  2001   partial hysterectomy - emergency during last C-section  . [place]; 1997; 2001  . CESAREAN SECTION       OB History   No obstetric history on file.     Family History  Problem Relation Age of Onset  . Hypertension Mother   . Diabetes Mother   . Diabetes type II Mother   . Diabetes type II Father   . Stroke Father   . Lung cancer Father 38  . Colon cancer Neg Hx     Social History   Tobacco Use  . Smoking status: Current Every Day Smoker    Packs/day: 0.50    Years: 31.00    Pack years: 15.50    Types: Cigarettes  . Smokeless tobacco: Never Used  Substance Use Topics  . Alcohol use: Yes    Comment: occassionally  . Drug use: Never    Home Medications Prior to Admission medications   Medication Sig Start Date End Date Taking? Authorizing Provider  amLODipine (NORVASC) 5 MG tablet Take 1 tablet (5 mg total) by mouth daily. 03/12/19   Vanessa Kick, MD  butalbital-acetaminophen-caffeine (FIORICET, ESGIC) [phone] MG tablet Take 1 tablet by mouth every 6 (six) hours as needed for headache. 05/31/17   Bonnielee Haff, MD  nicotine (NICODERM CQ -  DOSED IN MG/24 HOURS) 14 mg/24hr patch Place 1 patch (14 mg total) onto the skin daily. 06/01/17   Bonnielee Haff, MD  pantoprazole (PROTONIX) 40 MG tablet Take 1 tablet (40 mg total) by mouth daily at 12 noon. 05/31/17   Bonnielee Haff, MD  predniSONE (STERAPRED UNI-PAK 21 TAB) 10 MG (21) TBPK tablet Take by mouth daily. Take as directed. 03/12/19   Vanessa Kick, MD  terbinafine (ATHLETES FOOT, TERBINAFINE,) 1 % cream Apply 1 application topically 2 (two) times daily for 7 days. apply daily to both of your feet for 1 week. 08/02/19 08/09/19  Janeece Fitting, PA-C  terbinafine (LAMISIL) 250 MG tablet Take 1 tablet (250 mg total) by mouth daily for 14 days. 08/02/19 08/16/19  Janeece Fitting, PA-C    Allergies    Patient has no known allergies.  Review  of Systems   Review of Systems  Constitutional: Negative for chills and fever.  HENT: Negative for ear pain and sore throat.   Eyes: Negative for pain and visual disturbance.  Respiratory: Negative for cough and shortness of breath.   Cardiovascular: Negative for chest pain and palpitations.  Gastrointestinal: Negative for abdominal pain and vomiting.  Genitourinary: Negative for dysuria and hematuria.  Musculoskeletal: Negative for arthralgias and back pain.  Skin: Positive for rash (BL feet). Negative for color change.  Neurological: Negative for seizures and syncope.  All other systems reviewed and are negative.   Physical Exam Updated Vital Signs BP (!) 169/105 (BP Location: Left Arm)   Pulse 84   Temp 98.4 F (36.9 C) (Oral)   Resp 16   LMP 08/20/2011   SpO2 98%   Physical Exam Vitals and nursing note reviewed.  Constitutional:      General: She is not in acute distress.    Appearance: Normal appearance. She is well-developed.  HENT:     Head: Normocephalic and atraumatic.     Mouth/Throat:     Pharynx: No oropharyngeal exudate.  Eyes:     Pupils: Pupils are equal, round, and reactive to light.  Cardiovascular:     Rate and Rhythm: Regular rhythm.     Pulses:          Dorsalis pedis pulses are 2+ on the right side and 2+ on the left side.       Posterior tibial pulses are 2+ on the right side and 2+ on the left side.     Heart sounds: Normal heart sounds.  Pulmonary:     Effort: Pulmonary effort is normal. No respiratory distress.     Breath sounds: Normal breath sounds.  Abdominal:     General: Bowel sounds are normal. There is no distension.     Palpations: Abdomen is soft.     Tenderness: There is no abdominal tenderness.  Musculoskeletal:        General: No tenderness or deformity.     Cervical back: Normal range of motion.     Right lower leg: No edema.     Left lower leg: No edema.  Feet:     Right foot:     Skin integrity: Warmth and callus present.  No blister, skin breakdown or erythema.     Toenail Condition: Right toenails are abnormally thick. Fungal disease present.    Left foot:     Skin integrity: Warmth and callus present. No blister, skin breakdown or erythema.     Toenail Condition: Left toenails are abnormally thick. Fungal disease present.    Comments: Bilateral foot  rash with thickening in the skin.  No erythema, induration, oozing from the wounds. Skin:    General: Skin is warm and dry.  Neurological:     Mental Status: She is alert and oriented to person, place, and time.     ED Results / Procedures / Treatments   Labs (all labs ordered are listed, but only abnormal results are displayed) Labs Reviewed  COMPREHENSIVE METABOLIC PANEL - Abnormal; Notable for the following components:      Result Value   Glucose, Bld 128 (*)    All other components within normal limits  CBC WITH DIFFERENTIAL/PLATELET - Abnormal; Notable for the following components:   RBC 5.97 (*)    MCV 69.7 (*)    MCH 21.8 (*)    All other components within normal limits  LACTIC ACID, PLASMA  LACTIC ACID, PLASMA  URINALYSIS, ROUTINE W REFLEX MICROSCOPIC  I-STAT BETA HCG BLOOD, ED ([facility], [facility], [facility] ONLY)    EKG None  Radiology No results found.  Procedures Procedures (including critical care time)  Medications Ordered in ED Medications - No data to display  ED Course  I have reviewed the triage vital signs and the nursing notes.  Pertinent labs & imaging results that were available during my care of the patient were reviewed by me and considered in my medical decision making (see chart for details).    MDM Rules/Calculators/A&P   Patient with a past medical history of recurrent athlete's foot presents to the ED with complaints of fungal infection to her feet for the past 2 years.  Patient has been seen by urgent care, podiatry, reports there has been no improvement in symptoms.  She describes this rash as pruritus, there is no  changes consistent with induration, erythema, lower suspicion for cellulitis.  She does not report a prior history of diabetes, has not had any fevers, swelling to feet.  Does report after applying the cream for a couple of days no improvement was obtained.  She currently does not have a primary care physician, is requesting resources.  We will provide her with a [place] and wellness clinic.  I have extensively reviewed her chart and see this is a recurrent plan for patient. Labs such as CMP are within normal limits, her LFTs are unremarkable, will consider oral antifungal.  CBC without any signs of infections, hCG is negative.  I have discussed risks and benefits of prescribing this medication, patient reports she is agreeable on taking this medication.  She was instructed that if she develops any abdominal pain, yellowing of her eyes, yellowing of her skin she will need to return to the emergency department immediately.  Otherwise, patient will follow up with [place] [facility] and wellness in order to obtain further primary care.  Patient understands and agrees with management, hemodynamically stable stable for discharge.    Portions of this note were generated with [employer]. Dictation errors may occur despite best attempts at proofreading.  Final Clinical Impression(s) / ED Diagnoses Final diagnoses:  Tinea pedis of both feet    Rx / DC Orders ED Discharge Orders         Ordered    terbinafine (ATHLETES FOOT, TERBINAFINE,) 1 % cream  2 times daily     08/02/19 1333    terbinafine (LAMISIL) 250 MG tablet  Daily     08/02/19 1333           Janeece Fitting, PA-C 08/02/19 1338    Lucrezia Starch, MD  08/03/19 1641  

## 2019-08-02 NOTE — Discharge Instructions (Addendum)
I have prescribed medication to help with the fungal infection to your feet.  One of them is a cream application which you should place twice a day for the next 7 days.  The second medication is an oral medication, you will take 1 tablet daily for the next 14 days.  Please be aware this medication can affect your liver.  If you experience any yellow eyes, yellow skin, abdominal pain please return to the emergency department immediately.  Number to the [facility] health and wellness clinic is attached to your chart, you will need to schedule an appointment for further primary care.

## 2019-09-18 ENCOUNTER — Other Ambulatory Visit: Payer: Self-pay

## 2019-09-18 ENCOUNTER — Encounter (HOSPITAL_COMMUNITY): Payer: Self-pay | Admitting: Emergency Medicine

## 2019-09-18 ENCOUNTER — Emergency Department (HOSPITAL_COMMUNITY)
Admission: EM | Admit: 2019-09-18 | Discharge: 2019-09-18 | Disposition: A | Discharge status: Home / Self Care | Payer: Self-pay | Attending: Emergency Medicine | Admitting: Emergency Medicine

## 2019-09-18 ENCOUNTER — Emergency Department (HOSPITAL_COMMUNITY): Payer: Self-pay

## 2019-09-18 DIAGNOSIS — B349 Viral infection, unspecified: Secondary | ICD-10-CM | POA: Insufficient documentation

## 2019-09-18 DIAGNOSIS — Z20822 Contact with and (suspected) exposure to covid-19: Secondary | ICD-10-CM | POA: Insufficient documentation

## 2019-09-18 DIAGNOSIS — F17210 Nicotine dependence, cigarettes, uncomplicated: Secondary | ICD-10-CM | POA: Insufficient documentation

## 2019-09-18 DIAGNOSIS — Z79899 Other long term (current) drug therapy: Secondary | ICD-10-CM | POA: Insufficient documentation

## 2019-09-18 MED ORDER — ACETAMINOPHEN ER 650 MG PO TBCR: 650.0000 mg | EXTENDED_RELEASE_TABLET | Freq: Three times a day (TID) | ORAL | 0 refills | Status: DC | PRN

## 2019-09-18 MED ORDER — FLUTICASONE PROPIONATE 50 MCG/ACT NA SUSP: 1.0000 | Freq: Every day | NASAL | 0 refills | Status: DC

## 2019-09-18 MED ORDER — BENZONATATE 100 MG PO CAPS: 100.0000 mg | ORAL_CAPSULE | Freq: Three times a day (TID) | ORAL | 0 refills | Status: DC | PRN

## 2019-09-18 MED ORDER — ALBUTEROL SULFATE HFA 108 (90 BASE) MCG/ACT IN AERS: 1.0000 | INHALATION_SPRAY | Freq: Four times a day (QID) | RESPIRATORY_TRACT | 0 refills | Status: DC | PRN

## 2019-09-18 NOTE — Discharge Instructions (Signed)
You were seen in the emergency department today for a fever.  Your chest x-ray was normal.  We suspect you have a virus at this time.  We are sending you home with the following medicines: -Flonase: Use 1 spray per nostril daily as needed for nasal congestion/ear discomfort -Tessalon: Use 1 tablet every 8 hours as needed for cough -Albuterol inhaler: Use 1 to 2 puffs every 4-6 hours as needed for shortness of breath/wheezing -tylenol- Please take every 8 hours as needed for pain/fever.   We have prescribed you new medication(s) today. Discuss the medications prescribed today with your pharmacist as they can have adverse effects and interactions with your other medicines including over the counter and prescribed medications. Seek medical evaluation if you start to experience new or abnormal symptoms after taking one of these medicines, seek care immediately if you start to experience difficulty breathing, feeling of your throat closing, facial swelling, or rash as these could be indications of a more serious allergic reaction  We have tested you for COVID 19, we will call you within the next 72 hours if results are positive, you may also view these results on MyChart.   We are instructing patient's with COVID 19 or symptoms of COVID 19 to quarantine themselves for 14 days. You may be able to discontinue self quarantine if the following conditions are met:   Persons with COVID-19 who have symptoms and were directed to care for themselves at home may discontinue home isolation under the  following conditions: - It has been at least 7 days have passed since symptoms first appeared. - AND at least 3 days (72 hours) have passed since recovery defined as resolution of fever without the use of fever-reducing medications and improvement in respiratory symptoms (e.g., cough, shortness of breath)  Please follow the below quarantine instructions.   Please follow up with primary care within 3-5 days for  re-evaluation- call prior to going to the office to make them aware of your symptoms as some offices are altering their method of seeing patients with COVID 19 symptoms. Return to the ER for new or worsening symptoms including but not limited to increased work of breathing, chest pain, passing out, or any other concerns.         Person Under Monitoring Name: Amanda Garner  Location: [address] [place] [address]   Infection Prevention Recommendations for Individuals Confirmed to have, or Being Evaluated for, 2019 Novel Coronavirus (COVID-19) Infection Who Receive Care at Home  Individuals who are confirmed to have, or are being evaluated for, COVID-19 should follow the prevention steps below until a healthcare provider or local or state health department says they can return to normal activities.  Stay home except to get medical care You should restrict activities outside your home, except for getting medical care. Do not go to work, school, or public areas, and do not use public transportation or taxis.  Call ahead before visiting your doctor Before your medical appointment, call the healthcare provider and tell them that you have, or are being evaluated for, COVID-19 infection. This will help the healthcare provider's office take steps to keep other people from getting infected. Ask your healthcare provider to call the local or state health department.  Monitor your symptoms Seek prompt medical attention if your illness is worsening (e.g., difficulty breathing). Before going to your medical appointment, call the healthcare provider and tell them that you have, or are being evaluated for, COVID-19 infection. Ask your healthcare provider  to call the local or state health department.  Wear a facemask You should wear a facemask that covers your nose and mouth when you are in the same room with other people and when you visit a healthcare provider. People who live with  or visit you should also wear a facemask while they are in the same room with you.  Separate yourself from other people in your home As much as possible, you should stay in a different room from other people in your home. Also, you should use a separate bathroom, if available.  Avoid sharing household items You should not share dishes, drinking glasses, cups, eating utensils, towels, bedding, or other items with other people in your home. After using these items, you should wash them thoroughly with soap and water.  Cover your coughs and sneezes Cover your mouth and nose with a tissue when you cough or sneeze, or you can cough or sneeze into your sleeve. Throw used tissues in a lined trash can, and immediately wash your hands with soap and water for at least 20 seconds or use an alcohol-based hand rub.  Wash your [facility] your hands often and thoroughly with soap and water for at least 20 seconds. You can use an alcohol-based hand sanitizer if soap and water are not available and if your hands are not visibly dirty. Avoid touching your eyes, nose, and mouth with unwashed hands.   Prevention Steps for Caregivers and Household Members of Individuals Confirmed to have, or Being Evaluated for, COVID-19 Infection Being Cared for in the Home  If you live with, or provide care at home for, a person confirmed to have, or being evaluated for, COVID-19 infection please follow these guidelines to prevent infection:  Follow healthcare provider's instructions Make sure that you understand and can help the patient follow any healthcare provider instructions for all care.  Provide for the patient's basic needs You should help the patient with basic needs in the home and provide support for getting groceries, prescriptions, and other personal needs.  Monitor the patient's symptoms If they are getting sicker, call his or her medical provider and tell them that the patient has, or is being  evaluated for, COVID-19 infection. This will help the healthcare provider's office take steps to keep other people from getting infected. Ask the healthcare provider to call the local or state health department.  Limit the number of people who have contact with the patient If possible, have only one caregiver for the patient. Other household members should stay in another home or place of residence. If this is not possible, they should stay in another room, or be separated from the patient as much as possible. Use a separate bathroom, if available. Restrict visitors who do not have an essential need to be in the home.  Keep older adults, very young children, and other sick people away from the patient Keep older adults, very young children, and those who have compromised immune systems or chronic health conditions away from the patient. This includes people with chronic heart, lung, or kidney conditions, diabetes, and cancer.  Ensure good ventilation Make sure that shared spaces in the home have good air flow, such as from an air conditioner or an opened window, weather permitting.  Wash your hands often Wash your hands often and thoroughly with soap and water for at least 20 seconds. You can use an alcohol based hand sanitizer if soap and water are not available and if your hands are  not visibly dirty. Avoid touching your eyes, nose, and mouth with unwashed hands. Use disposable paper towels to dry your hands. If not available, use dedicated cloth towels and replace them when they become wet.  Wear a facemask and gloves Wear a disposable facemask at all times in the room and gloves when you touch or have contact with the patient's blood, body fluids, and/or secretions or excretions, such as sweat, saliva, sputum, nasal mucus, vomit, urine, or feces.  Ensure the mask fits over your nose and mouth tightly, and do not touch it during use. Throw out disposable facemasks and gloves after using  them. Do not reuse. Wash your hands immediately after removing your facemask and gloves. If your personal clothing becomes contaminated, carefully remove clothing and launder. Wash your hands after handling contaminated clothing. Place all used disposable facemasks, gloves, and other waste in a lined container before disposing them with other household waste. Remove gloves and wash your hands immediately after handling these items.  Do not share dishes, glasses, or other household items with the patient Avoid sharing household items. You should not share dishes, drinking glasses, cups, eating utensils, towels, bedding, or other items with a patient who is confirmed to have, or being evaluated for, COVID-19 infection. After the person uses these items, you should wash them thoroughly with soap and water.  Wash laundry thoroughly Immediately remove and wash clothes or bedding that have blood, body fluids, and/or secretions or excretions, such as sweat, saliva, sputum, nasal mucus, vomit, urine, or feces, on them. Wear gloves when handling laundry from the patient. Read and follow directions on labels of laundry or clothing items and detergent. In general, wash and dry with the warmest temperatures recommended on the label.  Clean all areas the individual has used often Clean all touchable surfaces, such as counters, tabletops, doorknobs, bathroom fixtures, toilets, phones, keyboards, tablets, and bedside tables, every day. Also, clean any surfaces that may have blood, body fluids, and/or secretions or excretions on them. Wear gloves when cleaning surfaces the patient has come in contact with. Use a diluted bleach solution (e.g., dilute bleach with 1 part bleach and 10 parts water) or a household disinfectant with a label that says EPA-registered for coronaviruses. To make a bleach solution at home, add 1 tablespoon of bleach to 1 quart (4 cups) of water. For a larger supply, add  cup of bleach to 1  gallon (16 cups) of water. Read labels of cleaning products and follow recommendations provided on product labels. Labels contain instructions for safe and effective use of the cleaning product including precautions you should take when applying the product, such as wearing gloves or eye protection and making sure you have good ventilation during use of the product. Remove gloves and wash hands immediately after cleaning.  Monitor yourself for signs and symptoms of illness Caregivers and household members are considered close contacts, should monitor their health, and will be asked to limit movement outside of the home to the extent possible. Follow the monitoring steps for close contacts listed on the symptom monitoring form.   ? If you have additional questions, contact your local health department or call the epidemiologist on call at [phone] (available 24/7). ? This guidance is subject to change. For the most up-to-date guidance from [facility], please refer to their website: YouBlogs.pl

## 2019-09-18 NOTE — ED Triage Notes (Signed)
Pt c/o congestion, bodyaches, SOB with COVID like symptoms. VSS. NAD at present.

## 2019-09-18 NOTE — ED Provider Notes (Signed)
[facility] EMERGENCY DEPARTMENT Provider Note   CSN: [id number] Arrival date & time: 09/18/19  2505     History CC: Cough  Amanda Garner is a 49 y.o. female with a history of crohn's disease, hypertension, anemia, sickle cell trait, & tobacco abuse who presents to the ED with complaints of cough for the past few days. Patient reports cough is dry & associated with nasal congestion, sore throat, ear pain, mild intermittent dyspnea, & body aches. No alleviating/aggravting factors. Taking airborne without much change. Denies fever, chills, emesis, chest pain, abdominal pain, unilateral leg pain/swelling, hemoptysis, recent surgery/trauma, recent long travel, hormone use, personal hx of cancer, or hx of DVT/PE. Her husband is sick with similar sxs. No known covid 19 exposures but they do both work with the public.    HPI     Past Medical History:  Diagnosis Date  . Acid reflux   . Anemia Dx: 2001 or so   . Blood transfusion   . Crohn disease ([place])   . Crohn's disease ([place])   . Diverticulitis   . Hypertension   . Sickle cell trait [facility])     Patient Active Problem List   Diagnosis Date Noted  . Diverticulitis of large intestine with abscess without bleeding 06/07/2017  . Dysuria   . Abscess of sigmoid colon due to diverticulitis 05/24/2017  . Sigmoid diverticulitis 05/11/2017  . Tobacco dependence 05/11/2017  . Marijuana abuse 05/11/2017  . Essential hypertension 05/11/2017  . Reflux esophagitis 05/11/2017  . Tinea pedis of left foot 04/11/2015  . Blister of foot with infection 04/11/2015  . Diverticulitis 09/07/2012  . C. difficile colitis 08/26/2011  . Abdominal pain, other specified site 08/26/2011  . Diarrhea 08/26/2011  . Abnormal finding on GI tract imaging 08/26/2011  . Enteritis 08/25/2011    Past Surgical History:  Procedure Laterality Date  . ABDOMINAL HYSTERECTOMY  2001   partial hysterectomy - emergency during last C-section  . [place]; 1997; 2001  . CESAREAN SECTION       OB History   No obstetric history on file.     Family History  Problem Relation Age of Onset  . Hypertension Mother   . Diabetes Mother   . Diabetes type II Mother   . Diabetes type II Father   . Stroke Father   . Lung cancer Father 92  . Colon cancer Neg Hx     Social History   Tobacco Use  . Smoking status: Current Every Day Smoker    Packs/day: 0.50    Years: 31.00    Pack years: 15.50    Types: Cigarettes  . Smokeless tobacco: Never Used  Substance Use Topics  . Alcohol use: Yes    Comment: occassionally  . Drug use: Never    Home Medications Prior to Admission medications   Medication Sig Start Date End Date Taking? Authorizing Provider  amLODipine (NORVASC) 5 MG tablet Take 1 tablet (5 mg total) by mouth daily. 03/12/19   Vanessa Kick, MD  butalbital-acetaminophen-caffeine (FIORICET, ESGIC) [phone] MG tablet Take 1 tablet by mouth every 6 (six) hours as needed for headache. 05/31/17   Bonnielee Haff, MD  nicotine (NICODERM CQ - DOSED IN MG/24 HOURS) 14 mg/24hr patch Place 1 patch (14 mg total) onto the skin daily. 06/01/17   Bonnielee Haff, MD  pantoprazole (PROTONIX) 40 MG tablet Take 1 tablet (40 mg total) by mouth daily at 12 noon. 05/31/17   Bonnielee Haff, MD  predniSONE (  STERAPRED UNI-PAK 21 TAB) 10 MG (21) TBPK tablet Take by mouth daily. Take as directed. 03/12/19   Vanessa Kick, MD    Allergies    Patient has no known allergies.  Review of Systems   Review of Systems  Constitutional: Negative for chills and fever.  HENT: Positive for congestion, ear pain and sore throat.   Respiratory: Positive for cough and shortness of breath.   Cardiovascular: Negative for chest pain and leg swelling.  Gastrointestinal: Negative for abdominal pain and vomiting.  Genitourinary: Negative for dysuria.  Musculoskeletal: Positive for myalgias (generalized).  Neurological: Negative for syncope.    Physical  Exam Updated Vital Signs BP (!) 153/108 (BP Location: Left Arm)   Pulse 93   Temp 98.2 F (36.8 C) (Oral)   Resp 16   LMP 08/20/2011   SpO2 96%   Physical Exam Vitals and nursing note reviewed.  Constitutional:      General: She is not in acute distress.    Appearance: She is well-developed.  HENT:     Head: Normocephalic and atraumatic.     Right Ear: Ear canal normal. Tympanic membrane is not perforated, erythematous, retracted or bulging.     Left Ear: Ear canal normal. Tympanic membrane is not perforated, erythematous, retracted or bulging.     Ears:     Comments: No mastoid erythema/swelling/tenderness.     Nose: Congestion present.     Right Sinus: No maxillary sinus tenderness or frontal sinus tenderness.     Left Sinus: No maxillary sinus tenderness or frontal sinus tenderness.     Mouth/Throat:     Pharynx: Uvula midline. No oropharyngeal exudate or posterior oropharyngeal erythema.     Comments: Posterior oropharynx is symmetric appearing. Patient tolerating own secretions without difficulty. No trismus. No drooling. No hot potato voice. No swelling beneath the tongue, submandibular compartment is soft.  Eyes:     General:        Right eye: No discharge.        Left eye: No discharge.     Conjunctiva/sclera: Conjunctivae normal.     Pupils: Pupils are equal, round, and reactive to light.  Cardiovascular:     Rate and Rhythm: Normal rate and regular rhythm.     Heart sounds: No murmur.  Pulmonary:     Effort: Pulmonary effort is normal. No respiratory distress.     Breath sounds: Normal breath sounds. No wheezing, rhonchi or rales.  Abdominal:     General: There is no distension.     Palpations: Abdomen is soft.     Tenderness: There is no abdominal tenderness. There is no guarding or rebound.  Musculoskeletal:     Cervical back: Normal range of motion and neck supple. No edema or rigidity.     Right lower leg: No edema.     Left lower leg: No edema.    Lymphadenopathy:     Cervical: No cervical adenopathy.  Skin:    General: Skin is warm and dry.     Findings: No rash.  Neurological:     Mental Status: She is alert.  Psychiatric:        Behavior: Behavior normal.    ED Results / Procedures / Treatments   Labs (all labs ordered are listed, but only abnormal results are displayed) Labs Reviewed  NOVEL CORONAVIRUS, NAA (HOSP ORDER, SEND-OUT TO REF LAB; TAT 18-24 HRS)    EKG None  Radiology DG Chest Port 1 View  Result Date: 09/18/2019 CLINICAL DATA:  Cough, congestion, hemoptysis, shortness of breath x3 days EXAM: PORTABLE CHEST 1 VIEW COMPARISON:  03/12/2019 FINDINGS: Lungs are essentially clear. Mild platelike atelectasis in the lingula. No pleural effusion or pneumothorax. The heart is normal in size. IMPRESSION: No evidence of acute cardiopulmonary disease. Electronically Signed   By: Julian Hy M.D.   On: 09/18/2019 10:42    Procedures Procedures (including critical care time)  Medications Ordered in ED Medications - No data to display  ED Course  I have reviewed the triage vital signs and the nursing notes.  Pertinent labs & imaging results that were available during my care of the patient were reviewed by me and considered in my medical decision making (see chart for details).    MDM Rules/Calculators/A&P                      Patient presents to the ED with complaints of cough as well as several associated sxs as documented above. Nontoxic, vitals WNL with the exception of elevated BP- doubt HTN emergency, no apparent distress. No sinus tenderness, afebrile in the ED, sxs < 10 days- doubt acute bacterial sinusitis. No signs of AOM, AOE, or mastoiditis. Per [facility] doubt strep. Exam not consistent w/ RPA/PTA. Lungs CTA, CXR without infiltrate doubt pneumonia. CXR also without PTX or fluid overload. Low risk wells- doubt PE. No chest pain, with constitutional sxs doubt ACS. Abdomen w/o peritoneal signs. Suspect  viral, sxs > 48 hours therefore influenza testing deferred, covid testing obtained, ambulatory with SpO2 maintaining 98-100%. Appears appropriate for discharge with supportive care & instructions for quarantine. I discussed results, treatment plan, need for follow-up, and return precautions with the patient. Provided opportunity for questions, patient confirmed understanding and is in agreement with plan.    Final Clinical Impression(s) / ED Diagnoses Final diagnoses:  Viral illness    Rx / DC Orders ED Discharge Orders         Ordered    fluticasone (FLONASE) 50 MCG/ACT nasal spray  Daily     09/18/19 1110    benzonatate (TESSALON) 100 MG capsule  3 times daily PRN     09/18/19 1110    albuterol (VENTOLIN HFA) 108 (90 Base) MCG/ACT inhaler  Every 6 hours PRN     09/18/19 1110    acetaminophen (TYLENOL 8 HOUR) 650 MG CR tablet  Every 8 hours PRN     09/18/19 1110           Hulet Ehrmann, [place] R, PA-C 09/18/19 1114    Hayden Rasmussen, MD 09/18/19 1919

## 2019-09-19 LAB — NOVEL CORONAVIRUS, NAA (HOSP ORDER, SEND-OUT TO REF LAB; TAT 18-24 HRS): SARS-CoV-2, NAA: NOT DETECTED

## 2019-11-26 ENCOUNTER — Ambulatory Visit (HOSPITAL_COMMUNITY)
Admission: EM | Admit: 2019-11-26 | Discharge: 2019-11-26 | Disposition: A | Discharge status: Home / Self Care | Payer: PRIVATE HEALTH INSURANCE | Attending: Family Medicine | Admitting: Family Medicine

## 2019-11-26 ENCOUNTER — Encounter (HOSPITAL_COMMUNITY): Payer: Self-pay | Admitting: Emergency Medicine

## 2019-11-26 ENCOUNTER — Other Ambulatory Visit: Payer: Self-pay

## 2019-11-26 DIAGNOSIS — K0889 Other specified disorders of teeth and supporting structures: Secondary | ICD-10-CM

## 2019-11-26 MED ORDER — CLINDAMYCIN HCL 300 MG PO CAPS: 300.0000 mg | ORAL_CAPSULE | Freq: Three times a day (TID) | ORAL | 0 refills | Status: DC

## 2019-11-26 MED ORDER — HYDROCODONE-ACETAMINOPHEN 7.5-325 MG PO TABS: 1.0000 | ORAL_TABLET | Freq: Four times a day (QID) | ORAL | 0 refills | Status: DC | PRN

## 2019-11-26 MED ORDER — ACETAMINOPHEN ER 650 MG PO TBCR: 650.0000 mg | EXTENDED_RELEASE_TABLET | Freq: Three times a day (TID) | ORAL | 0 refills | Status: DC | PRN

## 2019-11-26 NOTE — ED Provider Notes (Signed)
[place]    CSN: [id number] Arrival date & time: 11/26/19  1552      History   Chief Complaint Chief Complaint  Patient presents with  . Dental Pain    HPI Amanda Garner is a 49 y.o. female.   HPI   Intolerable dental pain is getting worse.  She states that she has had it for a couple weeks.  She saw her dentist.  She was placed on amoxicillin.  She is scheduled to go back tomorrow.  She continues to take the amoxicillin daily but it is not relieved her pain.  She is taking the maximum amounts of Tylenol and ibuprofen.  She is still in severe pain, tearful, was unable to work today.  She is here for pain management and a work excuse.   Past Medical History:  Diagnosis Date  . Acid reflux   . Anemia Dx: 2001 or so   . Blood transfusion   . Crohn disease ([place])   . Crohn's disease ([place])   . Diverticulitis   . Hypertension   . Sickle cell trait [facility])     Patient Active Problem List   Diagnosis Date Noted  . Diverticulitis of large intestine with abscess without bleeding 06/07/2017  . Dysuria   . Abscess of sigmoid colon due to diverticulitis 05/24/2017  . Sigmoid diverticulitis 05/11/2017  . Tobacco dependence 05/11/2017  . Marijuana abuse 05/11/2017  . Essential hypertension 05/11/2017  . Reflux esophagitis 05/11/2017  . Tinea pedis of left foot 04/11/2015  . Blister of foot with infection 04/11/2015  . Diverticulitis 09/07/2012  . C. difficile colitis 08/26/2011  . Abdominal pain, other specified site 08/26/2011  . Diarrhea 08/26/2011  . Abnormal finding on GI tract imaging 08/26/2011  . Enteritis 08/25/2011    Past Surgical History:  Procedure Laterality Date  . ABDOMINAL HYSTERECTOMY  2001   partial hysterectomy - emergency during last C-section  . [address]; 1997; 2001  . CESAREAN SECTION      OB History   No obstetric history on file.      Home Medications    Prior to Admission medications   Medication Sig Start  Date End Date Taking? Authorizing Provider  albuterol (VENTOLIN HFA) 108 (90 Base) MCG/ACT inhaler Inhale 1-2 puffs into the lungs every 6 (six) hours as needed for wheezing or shortness of breath. 09/18/19   Petrucelli, Samantha R, PA-C  amLODipine (NORVASC) 5 MG tablet Take 1 tablet (5 mg total) by mouth daily. 03/12/19   Vanessa Kick, MD  clindamycin (CLEOCIN) 300 MG capsule Take 1 capsule (300 mg total) by mouth 3 (three) times daily. 11/26/19   Raylene Everts, MD  fluticasone [facility]) 50 MCG/ACT nasal spray Place 1 spray into both nostrils daily. 09/18/19   Petrucelli, Glynda Jaeger, PA-C  HYDROcodone-acetaminophen (NORCO) 7.5-325 MG tablet Take 1 tablet by mouth every 6 (six) hours as needed for moderate pain. 11/26/19   Raylene Everts, MD  nicotine (NICODERM CQ - DOSED IN MG/24 HOURS) 14 mg/24hr patch Place 1 patch (14 mg total) onto the skin daily. 06/01/17   Bonnielee Haff, MD  pantoprazole (PROTONIX) 40 MG tablet Take 1 tablet (40 mg total) by mouth daily at 12 noon. 05/31/17   Bonnielee Haff, MD    Family History Family History  Problem Relation Age of Onset  . Hypertension Mother   . Diabetes Mother   . Diabetes type II Mother   . Diabetes type II Father   .  Stroke Father   . Lung cancer Father 76  . Colon cancer Neg Hx     Social History Social History   Tobacco Use  . Smoking status: Current Every Day Smoker    Packs/day: 0.50    Years: 31.00    Pack years: 15.50    Types: Cigarettes  . Smokeless tobacco: Never Used  Substance Use Topics  . Alcohol use: Yes    Comment: occassionally  . Drug use: Never     Allergies   Patient has no known allergies.   Review of Systems Review of Systems  HENT: Positive for dental problem.      Physical Exam Triage Vital Signs ED Triage Vitals  Enc Vitals Group     BP 11/26/19 1632 (!) 153/99     Pulse Rate 11/26/19 1632 92     Resp 11/26/19 1632 18     Temp 11/26/19 1632 98.9 F (37.2 C)     Temp Source 11/26/19  1632 Oral     SpO2 11/26/19 1632 99 %     Weight --      Height --      Head Circumference --      Peak Flow --      Pain Score 11/26/19 1634 8     Pain Loc --      Pain Edu? --      Excl. in [place]? --    No data found.  Updated Vital Signs BP (!) 153/99 (BP Location: Right Arm)   Pulse 92   Temp 98.9 F (37.2 C) (Oral)   Resp 18   LMP 08/20/2011   SpO2 99%      Physical Exam Constitutional:      General: She is not in acute distress.    Appearance: She is well-developed. She is ill-appearing.     Comments: Patient is clearly uncomfortable.  Cradling the right side of her face.  Talking with a muffled voice.  HENT:     Head: Normocephalic and atraumatic.     Mouth/Throat:     Mouth: Mucous membranes are moist.     Comments: Teeth on the right upper quadrant have many fractures, periodontal disease, erythema of the gums Eyes:     Conjunctiva/sclera: Conjunctivae normal.     Pupils: Pupils are equal, round, and reactive to light.  Cardiovascular:     Rate and Rhythm: Normal rate.  Pulmonary:     Effort: Pulmonary effort is normal. No respiratory distress.  Musculoskeletal:        General: Normal range of motion.     Cervical back: Normal range of motion.  Lymphadenopathy:     Cervical: Cervical adenopathy present.  Skin:    General: Skin is warm and dry.  Neurological:     Mental Status: She is alert.  Psychiatric:        Mood and Affect: Mood normal.        Behavior: Behavior normal.      UC Treatments / Results  Labs (all labs ordered are listed, but only abnormal results are displayed) Labs Reviewed - No data to display  EKG   Radiology No results found.  Procedures Procedures (including critical care time)  Medications Ordered in UC Medications - No data to display  Initial Impression / Assessment and Plan / UC Course  I have reviewed the triage vital signs and the nursing notes.  Pertinent labs & imaging results that were available during my  care of the patient  were reviewed by me and considered in my medical decision making (see chart for details).     I told her that failure of the amoxicillin means either that the pain is not infection or the antibiotic is not correct.  I am going to change her antibiotic and give her stronger pain medication.  She agrees to see her dentist tomorrow Final Clinical Impressions(s) / UC Diagnoses   Final diagnoses:  Pain, dental     Discharge Instructions     Take the clindamycin 3 x a day STOP THE AMOXICILLIN Take pain medicine as needed Take medicines with food Do not drive on the pain medicine See your dentist as scheduled    ED Prescriptions    Medication Sig Dispense Auth. Provider   clindamycin (CLEOCIN) 300 MG capsule Take 1 capsule (300 mg total) by mouth 3 (three) times daily. 21 capsule Raylene Everts, MD   HYDROcodone-acetaminophen [facility]) 7.5-325 MG tablet Take 1 tablet by mouth every 6 (six) hours as needed for moderate pain. 15 tablet Raylene Everts, MD     I have reviewed the PDMP during this encounter.   Raylene Everts, MD 11/26/19 [phone]

## 2019-11-26 NOTE — Discharge Instructions (Signed)
Take the clindamycin 3 x a day STOP THE AMOXICILLIN Take pain medicine as needed Take medicines with food Do not drive on the pain medicine See your dentist as scheduled

## 2019-11-26 NOTE — ED Triage Notes (Signed)
Pt here for dental pain on upper right side; pt has been on amoxicillin x 2 weeks without relief

## 2020-01-09 ENCOUNTER — Other Ambulatory Visit: Payer: Self-pay

## 2020-01-09 ENCOUNTER — Encounter (HOSPITAL_COMMUNITY): Payer: Self-pay | Admitting: Emergency Medicine

## 2020-01-09 ENCOUNTER — Emergency Department (HOSPITAL_COMMUNITY): Payer: Self-pay

## 2020-01-09 ENCOUNTER — Inpatient Hospital Stay (HOSPITAL_COMMUNITY)
Admission: EM | Admit: 2020-01-09 | Discharge: 2020-01-13 | DRG: 872 | Disposition: A | Discharge status: Home / Self Care | Payer: Self-pay | Attending: Internal Medicine | Admitting: Internal Medicine

## 2020-01-09 DIAGNOSIS — K429 Umbilical hernia without obstruction or gangrene: Secondary | ICD-10-CM | POA: Diagnosis present

## 2020-01-09 DIAGNOSIS — E876 Hypokalemia: Secondary | ICD-10-CM | POA: Diagnosis present

## 2020-01-09 DIAGNOSIS — A419 Sepsis, unspecified organism: Secondary | ICD-10-CM | POA: Insufficient documentation

## 2020-01-09 DIAGNOSIS — K0889 Other specified disorders of teeth and supporting structures: Secondary | ICD-10-CM | POA: Diagnosis present

## 2020-01-09 DIAGNOSIS — Z8249 Family history of ischemic heart disease and other diseases of the circulatory system: Secondary | ICD-10-CM

## 2020-01-09 DIAGNOSIS — K449 Diaphragmatic hernia without obstruction or gangrene: Secondary | ICD-10-CM | POA: Diagnosis present

## 2020-01-09 DIAGNOSIS — Z791 Long term (current) use of non-steroidal anti-inflammatories (NSAID): Secondary | ICD-10-CM

## 2020-01-09 DIAGNOSIS — Z6833 Body mass index (BMI) 33.0-33.9, adult: Secondary | ICD-10-CM

## 2020-01-09 DIAGNOSIS — K5720 Diverticulitis of large intestine with perforation and abscess without bleeding: Secondary | ICD-10-CM | POA: Diagnosis present

## 2020-01-09 DIAGNOSIS — Z20822 Contact with and (suspected) exposure to covid-19: Secondary | ICD-10-CM | POA: Diagnosis present

## 2020-01-09 DIAGNOSIS — Z823 Family history of stroke: Secondary | ICD-10-CM

## 2020-01-09 DIAGNOSIS — Z833 Family history of diabetes mellitus: Secondary | ICD-10-CM

## 2020-01-09 DIAGNOSIS — F17210 Nicotine dependence, cigarettes, uncomplicated: Secondary | ICD-10-CM | POA: Diagnosis present

## 2020-01-09 DIAGNOSIS — D573 Sickle-cell trait: Secondary | ICD-10-CM | POA: Diagnosis present

## 2020-01-09 DIAGNOSIS — Z79891 Long term (current) use of opiate analgesic: Secondary | ICD-10-CM

## 2020-01-09 DIAGNOSIS — Z90711 Acquired absence of uterus with remaining cervical stump: Secondary | ICD-10-CM

## 2020-01-09 DIAGNOSIS — K047 Periapical abscess without sinus: Secondary | ICD-10-CM | POA: Diagnosis present

## 2020-01-09 DIAGNOSIS — A4151 Sepsis due to Escherichia coli [E. coli]: Principal | ICD-10-CM | POA: Diagnosis present

## 2020-01-09 DIAGNOSIS — N390 Urinary tract infection, site not specified: Secondary | ICD-10-CM | POA: Diagnosis present

## 2020-01-09 DIAGNOSIS — K219 Gastro-esophageal reflux disease without esophagitis: Secondary | ICD-10-CM | POA: Diagnosis present

## 2020-01-09 DIAGNOSIS — K5792 Diverticulitis of intestine, part unspecified, without perforation or abscess without bleeding: Secondary | ICD-10-CM | POA: Diagnosis present

## 2020-01-09 DIAGNOSIS — Z79899 Other long term (current) drug therapy: Secondary | ICD-10-CM

## 2020-01-09 DIAGNOSIS — R634 Abnormal weight loss: Secondary | ICD-10-CM | POA: Diagnosis present

## 2020-01-09 DIAGNOSIS — F1290 Cannabis use, unspecified, uncomplicated: Secondary | ICD-10-CM | POA: Diagnosis present

## 2020-01-09 DIAGNOSIS — I10 Essential (primary) hypertension: Secondary | ICD-10-CM | POA: Diagnosis present

## 2020-01-09 DIAGNOSIS — R519 Headache, unspecified: Secondary | ICD-10-CM | POA: Diagnosis not present

## 2020-01-09 DIAGNOSIS — K5090 Crohn's disease, unspecified, without complications: Secondary | ICD-10-CM | POA: Diagnosis present

## 2020-01-09 DIAGNOSIS — Z801 Family history of malignant neoplasm of trachea, bronchus and lung: Secondary | ICD-10-CM

## 2020-01-09 DIAGNOSIS — Z98891 History of uterine scar from previous surgery: Secondary | ICD-10-CM

## 2020-01-09 LAB — CBC WITH DIFFERENTIAL/PLATELET
Abs Immature Granulocytes: 0.05 10*3/uL (ref 0.00–0.07)
Basophils Absolute: 0 10*3/uL (ref 0.0–0.1)
Basophils Relative: 0 %
Eosinophils Absolute: 0.2 10*3/uL (ref 0.0–0.5)
Eosinophils Relative: 2 %
HCT: 40.2 % (ref 36.0–46.0)
Hemoglobin: 13.1 g/dL (ref 12.0–15.0)
Immature Granulocytes: 1 %
Lymphocytes Relative: 13 %
Lymphs Abs: 1.3 10*3/uL (ref 0.7–4.0)
MCH: 22.2 pg — ABNORMAL LOW (ref 26.0–34.0)
MCHC: 32.6 g/dL (ref 30.0–36.0)
MCV: 68 fL — ABNORMAL LOW (ref 80.0–100.0)
Monocytes Absolute: 0.5 10*3/uL (ref 0.1–1.0)
Monocytes Relative: 5 %
Neutro Abs: 8.1 10*3/uL — ABNORMAL HIGH (ref 1.7–7.7)
Neutrophils Relative %: 79 %
Platelets: 223 10*3/uL (ref 150–400)
RBC: 5.91 MIL/uL — ABNORMAL HIGH (ref 3.87–5.11)
RDW: 14.4 % (ref 11.5–15.5)
WBC: 10.3 10*3/uL (ref 4.0–10.5)
nRBC: 0 % (ref 0.0–0.2)

## 2020-01-09 LAB — COMPREHENSIVE METABOLIC PANEL
ALT: 16 U/L (ref 0–44)
AST: 19 U/L (ref 15–41)
Albumin: 3.8 g/dL (ref 3.5–5.0)
Alkaline Phosphatase: 68 U/L (ref 38–126)
Anion gap: 14 (ref 5–15)
BUN: 10 mg/dL (ref 6–20)
CO2: 20 mmol/L — ABNORMAL LOW (ref 22–32)
Calcium: 9.4 mg/dL (ref 8.9–10.3)
Chloride: 105 mmol/L (ref 98–111)
Creatinine, Ser: 0.85 mg/dL (ref 0.44–1.00)
GFR calc Af Amer: >60 mL/min (ref >60)
GFR calc non Af Amer: >60 mL/min (ref >60)
Glucose, Bld: 137 mg/dL — ABNORMAL HIGH (ref 70–99)
Potassium: 3.8 mmol/L (ref 3.5–5.1)
Sodium: 139 mmol/L (ref 135–145)
Total Bilirubin: 0.9 mg/dL (ref 0.3–1.2)
Total Protein: 7.2 g/dL (ref 6.5–8.1)

## 2020-01-09 LAB — URINALYSIS, ROUTINE W REFLEX MICROSCOPIC
Bilirubin Urine: NEGATIVE
Glucose, UA: NEGATIVE mg/dL
Ketones, ur: NEGATIVE mg/dL
Nitrite: NEGATIVE
Protein, ur: 30 mg/dL — AB
Specific Gravity, Urine: 1.01 (ref 1.005–1.030)
WBC, UA: >50 WBC/hpf — ABNORMAL HIGH (ref 0–5)
pH: 5 (ref 5.0–8.0)

## 2020-01-09 LAB — HIV ANTIBODY (ROUTINE TESTING W REFLEX): HIV Screen 4th Generation wRfx: NONREACTIVE

## 2020-01-09 LAB — CREATININE, SERUM
Creatinine, Ser: 0.81 mg/dL (ref 0.44–1.00)
GFR calc Af Amer: >60 mL/min (ref >60)
GFR calc non Af Amer: >60 mL/min (ref >60)

## 2020-01-09 LAB — CBC
HCT: 37.4 % (ref 36.0–46.0)
Hemoglobin: 12 g/dL (ref 12.0–15.0)
MCH: 21.9 pg — ABNORMAL LOW (ref 26.0–34.0)
MCHC: 32.1 g/dL (ref 30.0–36.0)
MCV: 68.1 fL — ABNORMAL LOW (ref 80.0–100.0)
Platelets: 196 10*3/uL (ref 150–400)
RBC: 5.49 MIL/uL — ABNORMAL HIGH (ref 3.87–5.11)
RDW: 14.6 % (ref 11.5–15.5)
WBC: 10.6 10*3/uL — ABNORMAL HIGH (ref 4.0–10.5)
nRBC: 0 % (ref 0.0–0.2)

## 2020-01-09 LAB — LACTIC ACID, PLASMA
Lactic Acid, Venous: 1 mmol/L (ref 0.5–1.9)
Lactic Acid, Venous: 0.9 mmol/L (ref 0.5–1.9)

## 2020-01-09 LAB — I-STAT BETA HCG BLOOD, ED (MC, WL, AP ONLY): I-stat hCG, quantitative: <5 m[IU]/mL (ref <5)

## 2020-01-09 LAB — SARS CORONAVIRUS 2 BY RT PCR (HOSPITAL ORDER, PERFORMED IN ~~LOC~~ HOSPITAL LAB): SARS Coronavirus 2: NEGATIVE

## 2020-01-09 LAB — LIPASE, BLOOD: Lipase: 20 U/L (ref 11–51)

## 2020-01-09 MED ORDER — HYDROMORPHONE HCL 1 MG/ML IJ SOLN
1.0000 mg | INTRAMUSCULAR | Status: DC | PRN
  Administered 2020-01-09 – 2020-01-13 (×4): 1 mg via INTRAVENOUS
  Filled 2020-01-09 (×4): qty 1

## 2020-01-09 MED ORDER — ACETAMINOPHEN 325 MG PO TABS
650.0000 mg | ORAL_TABLET | Freq: Four times a day (QID) | ORAL | Status: DC | PRN
  Administered 2020-01-09 – 2020-01-11 (×6): 650 mg via ORAL
  Filled 2020-01-09 (×6): qty 2

## 2020-01-09 MED ORDER — ACETAMINOPHEN 500 MG PO TABS
1000.0000 mg | ORAL_TABLET | Freq: Once | ORAL | Status: AC
  Administered 2020-01-09: 1000 mg via ORAL
  Filled 2020-01-09: qty 2

## 2020-01-09 MED ORDER — HEPARIN SODIUM (PORCINE) 5000 UNIT/ML IJ SOLN
5000.0000 [IU] | Freq: Three times a day (TID) | INTRAMUSCULAR | Status: DC
  Administered 2020-01-09 – 2020-01-13 (×10): 5000 [IU] via SUBCUTANEOUS
  Filled 2020-01-09 (×11): qty 1

## 2020-01-09 MED ORDER — SODIUM CHLORIDE 0.9 % IV SOLN: INTRAVENOUS | Status: DC

## 2020-01-09 MED ORDER — SODIUM CHLORIDE 0.9 % IV SOLN
2.0000 g | INTRAVENOUS | Status: DC
  Administered 2020-01-10 – 2020-01-12 (×3): 2 g via INTRAVENOUS
  Filled 2020-01-09 (×3): qty 2

## 2020-01-09 MED ORDER — LABETALOL HCL 5 MG/ML IV SOLN
10.0000 mg | Freq: Once | INTRAVENOUS | Status: AC
  Administered 2020-01-09: 10 mg via INTRAVENOUS
  Filled 2020-01-09: qty 4

## 2020-01-09 MED ORDER — SODIUM CHLORIDE 0.9 % IV BOLUS (SEPSIS)
1000.0000 mL | Freq: Once | INTRAVENOUS | Status: AC
  Administered 2020-01-09: 1000 mL via INTRAVENOUS

## 2020-01-09 MED ORDER — SODIUM CHLORIDE 0.9 % IV SOLN: 2.0000 g | Freq: Once | INTRAVENOUS | Status: DC

## 2020-01-09 MED ORDER — METRONIDAZOLE IN NACL 5-0.79 MG/ML-% IV SOLN
500.0000 mg | Freq: Three times a day (TID) | INTRAVENOUS | Status: DC
  Administered 2020-01-10 – 2020-01-12 (×8): 500 mg via INTRAVENOUS
  Filled 2020-01-09 (×8): qty 100

## 2020-01-09 MED ORDER — KETOROLAC TROMETHAMINE 30 MG/ML IJ SOLN
30.0000 mg | Freq: Once | INTRAMUSCULAR | Status: AC
  Administered 2020-01-09: 30 mg via INTRAVENOUS
  Filled 2020-01-09: qty 1

## 2020-01-09 MED ORDER — HYDRALAZINE HCL 20 MG/ML IJ SOLN
5.0000 mg | Freq: Four times a day (QID) | INTRAMUSCULAR | Status: DC | PRN
  Administered 2020-01-10 – 2020-01-12 (×2): 5 mg via INTRAVENOUS
  Filled 2020-01-09 (×2): qty 1

## 2020-01-09 MED ORDER — ACETAMINOPHEN 650 MG RE SUPP: 650.0000 mg | Freq: Four times a day (QID) | RECTAL | Status: DC | PRN

## 2020-01-09 MED ORDER — ONDANSETRON HCL 4 MG/2ML IJ SOLN
4.0000 mg | Freq: Four times a day (QID) | INTRAMUSCULAR | Status: DC | PRN
  Administered 2020-01-10: 4 mg via INTRAVENOUS
  Filled 2020-01-09: qty 2

## 2020-01-09 MED ORDER — METRONIDAZOLE IN NACL 5-0.79 MG/ML-% IV SOLN
500.0000 mg | Freq: Once | INTRAVENOUS | Status: AC
  Administered 2020-01-09: 500 mg via INTRAVENOUS
  Filled 2020-01-09: qty 100

## 2020-01-09 MED ORDER — SODIUM CHLORIDE 0.9 % IV SOLN
1.0000 g | INTRAVENOUS | Status: DC
  Administered 2020-01-09: 1 g via INTRAVENOUS
  Filled 2020-01-09 (×3): qty 10

## 2020-01-09 NOTE — Progress Notes (Signed)
Patient ID: Starlynn Klinkner, female   DOB: 03/26/1971, 49 y.o.   MRN: [id number]  Formal consult to follow in AM.  CT scan/ labs reviewed  This non-contrasted CT scan shows only acute diverticulitis.  No abscess, no free fluid.  Would manage this conservatively with bowel rest, IV hydration, and IV antibiotics (On Rocephin/Flagyl)  No acute surgical indications at this time.  Imogene Burn. Georgette Dover, MD, [facility]  General/ Trauma Surgery   01/09/2020 5:56 PM

## 2020-01-09 NOTE — ED Triage Notes (Signed)
Pt here with c/o llq pain for 1 wek , pt has hx of diverticulitis alos c/o burning upon urination

## 2020-01-09 NOTE — H&P (Addendum)
History and Physical    Amanda Garner WUJ:[id number] DOB: 05-13-1971 DOA: 01/09/2020  PCP: Patient, No Pcp Per Patient coming from: home  Chief Complaint: Abdominal pain nausea and diarrhea for 1-1/2 weeks.  HPI: Amanda Garner is a 49 y.o. female with medical history significant of history of diverticulitis with abscess, hypertension, acid reflux admitted with abdominal pain that started 1 1-1/2 weeks ago.  Pain is in the lower abdomen and right flank associated with fever nausea loose stools. She has not been eating due to nausea and abdominal pain and has lost some weight.   Admitted with nausea and fever of 101.8 and abdominal pain and flank pain. She denied any blood in her stool or urine.  She has dysuria and frequency of urination. She denied any chest pain cough or shortness of breath.  She has not received Covid vaccine. She is Covid negative.   ED Course: She was febrile at 101.8.  She was started on IV fluids Rocephin and Flagyl.  Surgery has been consulted.  The abdomen and pelvis without contrast shows-Mild to moderately inflamed diverticula within the mid to distal sigmoid colon, consistent with acute diverticulitis. While the adjacent foci of air attenuation may represent enlarged, inflamed diverticula, small focal areas ofcontained free air cannot be excluded. Small hiatal hernia.  Review of Systems: As per HPI otherwise all other systems reviewed and are negative  Ambulatory Status: Ambulatory at baseline.  Past Medical History:  Diagnosis Date  . Acid reflux   . Anemia Dx: 2001 or so   . Blood transfusion   . Crohn disease ([place])   . Crohn's disease ([place])   . Diverticulitis   . Hypertension   . Sickle cell trait [facility])     Past Surgical History:  Procedure Laterality Date  . ABDOMINAL HYSTERECTOMY  2001   partial hysterectomy - emergency during last C-section  . [place]; 1997; 2001  . CESAREAN SECTION      Social History    Socioeconomic History  . Marital status: Married    Spouse name: Not on file  . Number of children: 3  . Years of education: Not on file  . Highest education level: Not on file  Occupational History  . 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 Smoking status: Current Every Day Smoker    Packs/day: 0.50    Years: 31.00    Pack years: 15.50    Types: Cigarettes  . Smokeless tobacco: Never Used  Substance and Sexual Activity  . Alcohol use: Yes    Comment: occassionally  . Drug use: Never  . Sexual activity: Yes    Partners: Male    Birth control/protection: Surgical  Other Topics Concern  . Not on file  Social History Narrative   ** Merged History Encounter **       Social Determinants of Health   Financial Resource Strain:   . Difficulty of Paying Living Expenses:   Food Insecurity:   . Worried About Charity fundraiser in the Last Year:   . Arboriculturist in the Last Year:   Transportation Needs:   . Film/video editor (Medical):   Marland Kitchen Lack of Transportation (Non-Medical):   Physical Activity:   . Days of Exercise per Week:   . Minutes of Exercise per Session:   Stress:   . Feeling of Stress :   Social Connections:   .  Frequency of Communication with Friends and Family:   . Frequency of Social Gatherings with Friends and Family:   . Attends Religious Services:   . Active Member of Clubs or Organizations:   . Attends Archivist Meetings:   Marland Kitchen Marital Status:   Intimate Partner Violence:   . Fear of Current or Ex-Partner:   . Emotionally Abused:   Marland Kitchen Physically Abused:   . Sexually Abused:     No Known Allergies  Family History  Problem Relation Age of Onset  . Hypertension Mother   . Diabetes Mother   . Diabetes type II Mother   . Diabetes type II Father   . Stroke Father   . Lung cancer Father 23  . Colon cancer Neg Hx      Prior to Admission  medications   Medication Sig Start Date End Date Taking? Authorizing Provider  cimetidine (TAGAMET) 200 MG tablet Take 200 mg by mouth 4 (four) times daily.   Yes [provider]  ibuprofen (ADVIL) 800 MG tablet Take 800 mg by mouth daily as needed for moderate pain.  05/09/16  Yes [provider]  albuterol (VENTOLIN HFA) 108 (90 Base) MCG/ACT inhaler Inhale 1-2 puffs into the lungs every 6 (six) hours as needed for wheezing or shortness of breath. Patient not taking: Reported on 01/09/2020 09/18/19   Petrucelli, Aldona Bar R, PA-C  amLODipine (NORVASC) 5 MG tablet Take 1 tablet (5 mg total) by mouth daily. Patient not taking: Reported on 01/09/2020 03/12/19   Vanessa Kick, MD  clindamycin (CLEOCIN) 300 MG capsule Take 1 capsule (300 mg total) by mouth 3 (three) times daily. Patient not taking: Reported on 01/09/2020 11/26/19   Raylene Everts, MD  fluticasone [facility]) 50 MCG/ACT nasal spray Place 1 spray into both nostrils daily. Patient not taking: Reported on 01/09/2020 09/18/19   Petrucelli, Glynda Jaeger, PA-C  HYDROcodone-acetaminophen (NORCO) 7.5-325 MG tablet Take 1 tablet by mouth every 6 (six) hours as needed for moderate pain. Patient not taking: Reported on 01/09/2020 11/26/19   Raylene Everts, MD  nicotine (NICODERM CQ - DOSED IN MG/24 HOURS) 14 mg/24hr patch Place 1 patch (14 mg total) onto the skin daily. Patient not taking: Reported on 01/09/2020 06/01/17   Bonnielee Haff, MD  pantoprazole (PROTONIX) 40 MG tablet Take 1 tablet (40 mg total) by mouth daily at 12 noon. Patient not taking: Reported on 01/09/2020 05/31/17   Bonnielee Haff, MD    Physical Exam: Vitals:   01/09/20 1652 01/09/20 1700 01/09/20 1715 01/09/20 1730  BP: (!) 154/98 (!) 141/80 125/75 139/83  Pulse: (!) 104     Resp: 15 20 (!) 21 (!) 23  Temp: 99.2 F (37.3 C)     TempSrc: Oral     SpO2: 97%     Weight:      Height:         . General: Appears mild distress due to pain . Eyes: PERRL,  EOMI, normal lids, iris . ENT: grossly normal hearing, lips & tongue, mmm . Neck:  no LAD, masses or thyromegaly . Cardiovascular:  RRR, no m/r/g. No LE edema.  Marland Kitchen Respiratory:  CTA bilaterally, no w/r/r. Normal respiratory effort. . Abdomen: soft, lower abdominal tenderness with right flank tenderness . Skin:  no rash or induration seen on limited exam . Musculoskeletal:  grossly normal tone BUE/BLE, good ROM, no bony abnormality . Psychiatric:  grossly normal mood and affect, speech fluent and appropriate, AOx3 . Neurologic:  CN 2-12 grossly  intact, moves all extremities in coordinated fashion, sensation intact  Labs on Admission: I have personally reviewed following labs and imaging studies  CBC: Recent Labs  Lab 01/09/20 0914  WBC 10.3  NEUTROABS 8.1*  HGB 13.1  HCT 40.2  MCV 68.0*  PLT 419   Basic Metabolic Panel: Recent Labs  Lab 01/09/20 0914  NA 139  K 3.8  CL 105  CO2 20*  GLUCOSE 137*  BUN 10  CREATININE 0.85  CALCIUM 9.4   GFR: Estimated Creatinine Clearance: 92.6 mL/min (by C-G formula based on SCr of 0.85 mg/dL). Liver Function Tests: Recent Labs  Lab 01/09/20 0914  AST 19  ALT 16  ALKPHOS 68  BILITOT 0.9  PROT 7.2  ALBUMIN 3.8   Recent Labs  Lab 01/09/20 0914  LIPASE 20   No results for input(s): AMMONIA in the last 168 hours. Coagulation Profile: No results for input(s): INR, PROTIME in the last 168 hours. Cardiac Enzymes: No results for input(s): CKTOTAL, CKMB, CKMBINDEX, TROPONINI in the last 168 hours. BNP (last 3 results) No results for input(s): PROBNP in the last 8760 hours. HbA1C: No results for input(s): HGBA1C in the last 72 hours. CBG: No results for input(s): GLUCAP in the last 168 hours. Lipid Profile: No results for input(s): CHOL, HDL, LDLCALC, TRIG, CHOLHDL, LDLDIRECT in the last 72 hours. Thyroid Function Tests: No results for input(s): TSH, T4TOTAL, FREET4, T3FREE, THYROIDAB in the last 72 hours. Anemia Panel: No  results for input(s): VITAMINB12, FOLATE, FERRITIN, TIBC, IRON, RETICCTPCT in the last 72 hours. Urine analysis:    Component Value Date/Time   COLORURINE YELLOW 01/09/2020 0959   APPEARANCEUR CLOUDY (A) 01/09/2020 0959   LABSPEC 1.010 01/09/2020 0959   PHURINE 5.0 01/09/2020 0959   GLUCOSEU NEGATIVE 01/09/2020 0959   HGBUR MODERATE (A) 01/09/2020 0959   BILIRUBINUR NEGATIVE 01/09/2020 0959   KETONESUR NEGATIVE 01/09/2020 0959   PROTEINUR 30 (A) 01/09/2020 0959   UROBILINOGEN 1.0 09/07/2012 0019   NITRITE NEGATIVE 01/09/2020 0959   LEUKOCYTESUR LARGE (A) 01/09/2020 0959    Creatinine Clearance: Estimated Creatinine Clearance: 92.6 mL/min (by C-G formula based on SCr of 0.85 mg/dL).  Sepsis Labs: @LABRCNTIP (procalcitonin:4,lacticidven:4) )No results found for this or any previous visit (from the past 240 hour(s)).   Radiological Exams on Admission: CT Renal Stone Study  Result Date: 01/09/2020 CLINICAL DATA:  Left lower quadrant pain x1 week. EXAM: CT ABDOMEN AND PELVIS WITHOUT CONTRAST TECHNIQUE: Multidetector CT imaging of the abdomen and pelvis was performed following the standard protocol without IV contrast. COMPARISON:  May 30, 2017 FINDINGS: Lower chest: No acute abnormality. Hepatobiliary: No focal liver abnormality is seen. No gallstones, gallbladder wall thickening, or biliary dilatation. Pancreas: Unremarkable. No pancreatic ductal dilatation or surrounding inflammatory changes. Spleen: Normal in size without focal abnormality. Adrenals/Urinary Tract: Adrenal glands are unremarkable. Kidneys are normal, without renal calculi or focal lesions. Mild the prominent extrarenal pelvis are seen bilaterally, left greater than right. Bladder is unremarkable. Stomach/Bowel: There is a small hiatal hernia. Appendix appears normal. Mild to moderately inflamed diverticula are seen within the mid to distal sigmoid colon. 1.5 cm and 1.2 cm foci of air attenuation are also seen adjacent to  this portion of the sigmoid colon (axial CT image 68, CT series number 2). Vascular/Lymphatic: No significant vascular findings are present. No enlarged abdominal or pelvic lymph nodes. Reproductive: Uterus and bilateral adnexa are unremarkable. Other: No abdominal wall hernia or abnormality. No abdominopelvic ascites. Musculoskeletal: No acute or significant osseous findings. IMPRESSION:  1. Mild to moderately inflamed diverticula within the mid to distal sigmoid colon, consistent with acute diverticulitis. While the adjacent foci of air attenuation may represent enlarged, inflamed diverticula, small focal areas of contained free air cannot be excluded. 2. Small hiatal hernia. Electronically Signed   By: Virgina Norfolk M.D.   On: 01/09/2020 16:09    Assessment/Plan Active Problems:   * No active hospital problems. *    #1 acute diverticulitis patient admitted with abdominal pain nausea and diarrhea.  Patient has history of diverticulitis and abscess.  I will start her on Rocephin and Flagyl keep her n.p.o. on IV fluids surgery has been consulted from the ER.  Dilaudid for pain control.  #2 history of essential hypertension-hydralazine as needed hold p.o. meds.  #3 UTI-patient admitted with fever dysuria and frequency of urination.  UA consistent with UTI with large leukocytes and more than 50 WBCs.  Patient has been started on Rocephin continue the same.  Severity of Illness: The appropriate patient status for this patient is INPATIENT. Inpatient status is judged to be reasonable and necessary in order to provide the required intensity of service to ensure the patient's safety. The patient's presenting symptoms, physical exam findings, and initial radiographic and laboratory data in the context of their chronic comorbidities is felt to place them at high risk for further clinical deterioration. Furthermore, it is not anticipated that the patient will be medically stable for discharge from the  hospital within 2 midnights of admission. The following factors support the patient status of inpatient.   " The patient's presenting symptoms include nausea fever abdominal pain flank pain. " The worrisome physical exam findings include tachycardia " The initial radiographic and laboratory data are worrisome because of acute diverticulitis " The chronic co-morbidities include history of diverticulitis   * I certify that at the point of admission it is my clinical judgment that the patient will require inpatient hospital care spanning beyond 2 midnights from the point of admission due to high intensity of service, high risk for further deterioration and high frequency of surveillance required.*    Estimated body mass index is 33.57 kg/m as calculated from the following:   Height as of this encounter: 5' 6"  (1.676 m).   Weight as of this encounter: 94.3 kg.   DVT prophylaxis: Heparin Code Status: Full code Family Communication: Discussed with husband in the room Disposition Plan: Pending clinical improvement Consults called: ED has called surgical consult Admission status: Inpatient   Georgette Shell MD [facility]  If 7PM-7AM, please contact night-coverage [email or web address] Password [facility]  01/09/2020, 5:45 PM

## 2020-01-09 NOTE — Progress Notes (Signed)
Amanda Garner is a 49 y.o. female patient admitted. Awake, alert - oriented  X 4 - no acute distress noted.  VSS - Blood pressure (!) 164/106, pulse 95, temperature 98.5 F (36.9 C), temperature source Oral, resp. rate 18, height 5' 6"  (1.676 m), weight 94.3 kg, last menstrual period 08/20/2011, SpO2 99 %.    IV in place, occlusive dsg intact without redness.  Will cont to eval and treat per MD orders.  Minna Antis, RN 01/09/2020 9:40 PM

## 2020-01-09 NOTE — ED Provider Notes (Addendum)
[facility] EMERGENCY DEPARTMENT Provider Note   CSN: [id number] Arrival date & time: 01/09/20  5102     History No chief complaint on file.   Amanda Garner is a 49 y.o. female with history of acid reflux, diverticulitis with abscess, hypertension presents to the ER for evaluation of abdominal pain.  Onset 1-1/2 weeks ago.  Pain is located in the lower abdomen with radiation to the right flank, intermittent, mild to moderate.  Associated with fever, nausea, runny stools, dysuria and urinary frequency.  No interventions.  No alleviating factors.  She denies vomiting, hematemesis, abnormal vaginal discharge or bleeding.  States she has not paid attention to the color of her stool and unknown if she has any blood in stool.  HPI     Past Medical History:  Diagnosis Date  . Acid reflux   . Anemia Dx: 2001 or so   . Blood transfusion   . Crohn disease ([place])   . Crohn's disease ([place])   . Diverticulitis   . Hypertension   . Sickle cell trait [facility])     Patient Active Problem List   Diagnosis Date Noted  . Diverticulitis of large intestine with abscess without bleeding 06/07/2017  . Dysuria   . Abscess of sigmoid colon due to diverticulitis 05/24/2017  . Sigmoid diverticulitis 05/11/2017  . Tobacco dependence 05/11/2017  . Marijuana abuse 05/11/2017  . Essential hypertension 05/11/2017  . Reflux esophagitis 05/11/2017  . Tinea pedis of left foot 04/11/2015  . Blister of foot with infection 04/11/2015  . Diverticulitis 09/07/2012  . C. difficile colitis 08/26/2011  . Abdominal pain, other specified site 08/26/2011  . Diarrhea 08/26/2011  . Abnormal finding on GI tract imaging 08/26/2011  . Enteritis 08/25/2011    Past Surgical History:  Procedure Laterality Date  . ABDOMINAL HYSTERECTOMY  2001   partial hysterectomy - emergency during last C-section  . [place]; 1997; 2001  . CESAREAN SECTION       OB History   No obstetric history on  file.     Family History  Problem Relation Age of Onset  . Hypertension Mother   . Diabetes Mother   . Diabetes type II Mother   . Diabetes type II Father   . Stroke Father   . Lung cancer Father 32  . Colon cancer Neg Hx     Social History   Tobacco Use  . Smoking status: Current Every Day Smoker    Packs/day: 0.50    Years: 31.00    Pack years: 15.50    Types: Cigarettes  . Smokeless tobacco: Never Used  Substance Use Topics  . Alcohol use: Yes    Comment: occassionally  . Drug use: Never    Home Medications Prior to Admission medications   Medication Sig Start Date End Date Taking? Authorizing Provider  cimetidine (TAGAMET) 200 MG tablet Take 200 mg by mouth 4 (four) times daily.   Yes [provider]  ibuprofen (ADVIL) 800 MG tablet Take 800 mg by mouth daily as needed for moderate pain.  05/09/16  Yes [provider]  albuterol (VENTOLIN HFA) 108 (90 Base) MCG/ACT inhaler Inhale 1-2 puffs into the lungs every 6 (six) hours as needed for wheezing or shortness of breath. Patient not taking: Reported on 01/09/2020 09/18/19   Petrucelli, Aldona Bar R, PA-C  amLODipine (NORVASC) 5 MG tablet Take 1 tablet (5 mg total) by mouth daily. Patient not taking: Reported on 01/09/2020 03/12/19   Vanessa Kick,  MD  clindamycin (CLEOCIN) 300 MG capsule Take 1 capsule (300 mg total) by mouth 3 (three) times daily. Patient not taking: Reported on 01/09/2020 11/26/19   Raylene Everts, MD  fluticasone [facility]) 50 MCG/ACT nasal spray Place 1 spray into both nostrils daily. Patient not taking: Reported on 01/09/2020 09/18/19   Petrucelli, Glynda Jaeger, PA-C  HYDROcodone-acetaminophen (NORCO) 7.5-325 MG tablet Take 1 tablet by mouth every 6 (six) hours as needed for moderate pain. Patient not taking: Reported on 01/09/2020 11/26/19   Raylene Everts, MD  nicotine (NICODERM CQ - DOSED IN MG/24 HOURS) 14 mg/24hr patch Place 1 patch (14 mg total) onto the skin daily. Patient not  taking: Reported on 01/09/2020 06/01/17   Bonnielee Haff, MD  pantoprazole (PROTONIX) 40 MG tablet Take 1 tablet (40 mg total) by mouth daily at 12 noon. Patient not taking: Reported on 01/09/2020 05/31/17   Bonnielee Haff, MD    Allergies    Patient has no known allergies.  Review of Systems   Review of Systems  Constitutional: Positive for fever.  Gastrointestinal: Positive for abdominal pain, diarrhea and nausea.  Genitourinary: Positive for dysuria, flank pain and frequency.  All other systems reviewed and are negative.   Physical Exam Updated Vital Signs BP 139/83   Pulse (!) 104   Temp 99.2 F (37.3 C) (Oral)   Resp (!) 23   Ht 5' 6"  (1.676 m)   Wt 94.3 kg   LMP 08/20/2011   SpO2 97%   BMI 33.57 kg/m   Physical Exam Vitals and nursing note reviewed.  Constitutional:      Appearance: She is well-developed.     Comments: Non toxic in NAD  HENT:     Head: Normocephalic and atraumatic.     Nose: Nose normal.  Eyes:     Conjunctiva/sclera: Conjunctivae normal.  Cardiovascular:     Rate and Rhythm: Normal rate and regular rhythm.  Pulmonary:     Effort: Pulmonary effort is normal.     Breath sounds: Normal breath sounds.  Abdominal:     General: Bowel sounds are normal.     Palpations: Abdomen is soft.     Tenderness: There is abdominal tenderness. There is right CVA tenderness.       Comments: Diffuse lower abdominal and R CVA tenderness.  Soft. No G/R/R. Negative Murphy's and McBurney's. Active BS to lower quadrants.   Musculoskeletal:        General: Normal range of motion.     Cervical back: Normal range of motion.  Skin:    General: Skin is warm and dry.     Capillary Refill: Capillary refill takes less than 2 seconds.  Neurological:     Mental Status: She is alert.  Psychiatric:        Behavior: Behavior normal.     ED Results / Procedures / Treatments   Labs (all labs ordered are listed, but only abnormal results are displayed) Labs Reviewed    COMPREHENSIVE METABOLIC PANEL - Abnormal; Notable for the following components:      Result Value   CO2 20 (*)    Glucose, Bld 137 (*)    All other components within normal limits  CBC WITH DIFFERENTIAL/PLATELET - Abnormal; Notable for the following components:   RBC 5.91 (*)    MCV 68.0 (*)    MCH 22.2 (*)    Neutro Abs 8.1 (*)    All other components within normal limits  URINALYSIS, ROUTINE W REFLEX MICROSCOPIC -  Abnormal; Notable for the following components:   APPearance CLOUDY (*)    Hgb urine dipstick MODERATE (*)    Protein, ur 30 (*)    Leukocytes,Ua LARGE (*)    WBC, UA >50 (*)    Bacteria, UA RARE (*)    All other components within normal limits  CULTURE, BLOOD (ROUTINE X 2)  CULTURE, BLOOD (ROUTINE X 2)  URINE CULTURE  LIPASE, BLOOD  LACTIC ACID, PLASMA  LACTIC ACID, PLASMA  I-STAT BETA HCG BLOOD, ED ([facility], [facility], [facility] ONLY)    EKG None  Radiology CT Renal Stone Study  Result Date: 01/09/2020 CLINICAL DATA:  Left lower quadrant pain x1 week. EXAM: CT ABDOMEN AND PELVIS WITHOUT CONTRAST TECHNIQUE: Multidetector CT imaging of the abdomen and pelvis was performed following the standard protocol without IV contrast. COMPARISON:  May 30, 2017 FINDINGS: Lower chest: No acute abnormality. Hepatobiliary: No focal liver abnormality is seen. No gallstones, gallbladder wall thickening, or biliary dilatation. Pancreas: Unremarkable. No pancreatic ductal dilatation or surrounding inflammatory changes. Spleen: Normal in size without focal abnormality. Adrenals/Urinary Tract: Adrenal glands are unremarkable. Kidneys are normal, without renal calculi or focal lesions. Mild the prominent extrarenal pelvis are seen bilaterally, left greater than right. Bladder is unremarkable. Stomach/Bowel: There is a small hiatal hernia. Appendix appears normal. Mild to moderately inflamed diverticula are seen within the mid to distal sigmoid colon. 1.5 cm and 1.2 cm foci of air attenuation are also seen  adjacent to this portion of the sigmoid colon (axial CT image 68, CT series number 2). Vascular/Lymphatic: No significant vascular findings are present. No enlarged abdominal or pelvic lymph nodes. Reproductive: Uterus and bilateral adnexa are unremarkable. Other: No abdominal wall hernia or abnormality. No abdominopelvic ascites. Musculoskeletal: No acute or significant osseous findings. IMPRESSION: 1. Mild to moderately inflamed diverticula within the mid to distal sigmoid colon, consistent with acute diverticulitis. While the adjacent foci of air attenuation may represent enlarged, inflamed diverticula, small focal areas of contained free air cannot be excluded. 2. Small hiatal hernia. Electronically Signed   By: Virgina Norfolk M.D.   On: 01/09/2020 16:09    Procedures .Critical Care Performed by: Kinnie Feil, PA-C Authorized by: Kinnie Feil, PA-C   Critical care provider statement:    Critical care time (minutes):  45   Critical care was necessary to treat or prevent imminent or life-threatening deterioration of the following conditions:  Sepsis   Critical care was time spent personally by me on the following activities:  Discussions with consultants, evaluation of patient's response to treatment, examination of patient, ordering and performing treatments and interventions, ordering and review of laboratory studies, ordering and review of radiographic studies, pulse oximetry, re-evaluation of patient's condition, obtaining history from patient or surrogate and review of old charts   I assumed direction of critical care for this patient from another provider in my specialty: no     (including critical care time)  Medications Ordered in ED Medications  cefTRIAXone (ROCEPHIN) 1 g in sodium chloride 0.9 % 100 mL IVPB (0 g Intravenous Stopped 01/09/20 1654)  sodium chloride 0.9 % bolus 1,000 mL (0 mLs Intravenous Stopped 01/09/20 1741)  ketorolac (TORADOL) 30 MG/ML injection 30 mg  (30 mg Intravenous Given 01/09/20 1517)  acetaminophen (TYLENOL) tablet 1,000 mg (1,000 mg Oral Given 01/09/20 1520)  metroNIDAZOLE (FLAGYL) IVPB 500 mg (500 mg Intravenous New Bag/Given 01/09/20 1654)  sodium chloride 0.9 % bolus 1,000 mL (1,000 mLs Intravenous Bolus from Bag 01/09/20 1742)  ED Course  I have reviewed the triage vital signs and the nursing notes.  Pertinent labs & imaging results that were available during my care of the patient were reviewed by me and considered in my medical decision making (see chart for details).  Clinical Course as of Jan 09 1756  Wed Jan 09, 2020  1444 Temp(!): 101.8 F (38.8 C) [CG]  1444 Pulse Rate(!): 120 [CG]  1444 BP(!): 170/100 [CG]  1444 AST: 19 [CG]  1444 ALT: 16 [CG]  1444 Alkaline Phosphatase: 68 [CG]  1444 Total Bilirubin: 0.9 [CG]  1445 WBC: 10.3 [CG]  1445 Leukocytes,Ua(!): LARGE [CG]  1445 RBC / HPF: 21-50 [CG]  1445 WBC, UA(!): >50 [CG]  1445 Bacteria, UA(!): RARE [CG]  1445 Squamous Epithelial / LPF: 0-5 [CG]  1639 1. Mild to moderately inflamed diverticula within the mid to distal sigmoid colon, consistent with acute diverticulitis. While the adjacent foci of air attenuation may represent enlarged, inflamed diverticula, small focal areas of contained free air cannot be excluded. 2. Small hiatal hernia.   CT Renal Laren Everts [CG]    Clinical Course User Index [CG] Arlean Hopping   MDM Rules/Calculators/A&P                      49 year old female here with fever, diarrhea, urinary symptoms.  On arrival she is febrile, tachycardic.  She has diffuse lower abdominal right CVA tenderness.  ER work-up initiated in triage, personally reviewed and interpreted.  UA as above suspicious for infection.  She meets SIRS criteria with fever, tachycardia and source of infection likely GU.  Given flank pain, RBCs, CT renal obtained to rule out calculus.    Will initiate ceftriaxone, IVF, toradol, tylenol. Reassess.    1728: CT renal shows acute diverticulitis with possible perforation.   Sepsis code initiated, order set utilized.  Intra-abdominal broad-spectrum antibiotics ordered to cover diverticulitis.  Initially ordered ceftriaxone for suspected pyelonephritis.  Spoke to pharmacist regarding antibiotics, ok for now to kep ceftriaxone/flagyl.   Fever, tachycardia have significantly improved after 1 L IV fluid.  Blood pressure is normal.  Lactic acid is 1.0, no other signs of end organ damage. Patient re-evaluated and reports improvement in pain.  Overall clinical improvement.    Will order second liter IV fluid, consult general surgery.  Anticipate admission.  Blood cultures, urine culture sent.  1750: Spoke to Dr Zigmund Daniel who will accept patient.  Spoke to Dr Georgette Dover sugery team, aware of patient. Patient re-evaluated, no clinical decline. VS stable, BP normal.  Updated on POC, in agreement. Discussed patient with EDP.  Final Clinical Impression(s) / ED Diagnoses Final diagnoses:  Sepsis without acute organ dysfunction, due to unspecified organism [facility])    Rx / DC Orders ED Discharge Orders    None       Kinnie Feil, PA-C 01/09/20 1757    Tegeler, Gwenyth Allegra, MD 01/09/20 [phone]

## 2020-01-09 NOTE — Progress Notes (Deleted)
Pharmacy Antibiotic Note  Amanda Garner is a 49 y.o. female admitted on 01/09/2020 with intra-abdominal infection.  Pharmacy has been consulted for cefepime dosing.  Plan: Cefepime 2 g IV every 8 hours  Monitor clinical status and renal function Follow-up cultures   Height: 5' 6"  (167.6 cm) Weight: 94.3 kg (208 lb) IBW/kg (Calculated) : 59.3  Temp (24hrs), Avg:101.8 F (38.8 C), Min:101.7 F (38.7 C), Max:101.8 F (38.8 C)  Recent Labs  Lab 01/09/20 0914 01/09/20 1509  WBC 10.3  --   CREATININE 0.85  --   LATICACIDVEN  --  1.0    Estimated Creatinine Clearance: 92.6 mL/min (by C-G formula based on SCr of 0.85 mg/dL).    No Known Allergies  Antimicrobials this admission: Ceftriaxone 5/19 x1 Cefepime 5/20 >>  Microbiology results: 5/19 UCx: sent 5/19 BCx x2: sent     Mimi L. Devin Going, [place] PGY1 Pharmacy Resident [phone] 01/09/20      4:49 PM  Please check AMION for all [place] phone numbers After 10:00 PM, call the [place] [phone]

## 2020-01-10 DIAGNOSIS — E876 Hypokalemia: Secondary | ICD-10-CM

## 2020-01-10 DIAGNOSIS — N390 Urinary tract infection, site not specified: Secondary | ICD-10-CM

## 2020-01-10 LAB — COMPREHENSIVE METABOLIC PANEL
ALT: 15 U/L (ref 0–44)
AST: 15 U/L (ref 15–41)
Albumin: 3.2 g/dL — ABNORMAL LOW (ref 3.5–5.0)
Alkaline Phosphatase: 55 U/L (ref 38–126)
Anion gap: 9 (ref 5–15)
BUN: 6 mg/dL (ref 6–20)
CO2: 23 mmol/L (ref 22–32)
Calcium: 8.4 mg/dL — ABNORMAL LOW (ref 8.9–10.3)
Chloride: 106 mmol/L (ref 98–111)
Creatinine, Ser: 0.75 mg/dL (ref 0.44–1.00)
GFR calc Af Amer: >60 mL/min (ref >60)
GFR calc non Af Amer: >60 mL/min (ref >60)
Glucose, Bld: 102 mg/dL — ABNORMAL HIGH (ref 70–99)
Potassium: 3 mmol/L — ABNORMAL LOW (ref 3.5–5.1)
Sodium: 138 mmol/L (ref 135–145)
Total Bilirubin: 0.7 mg/dL (ref 0.3–1.2)
Total Protein: 6.7 g/dL (ref 6.5–8.1)

## 2020-01-10 LAB — BLOOD CULTURE ID PANEL (REFLEXED)

## 2020-01-10 LAB — CBC
HCT: 36.6 % (ref 36.0–46.0)
Hemoglobin: 11.9 g/dL — ABNORMAL LOW (ref 12.0–15.0)
MCH: 22.2 pg — ABNORMAL LOW (ref 26.0–34.0)
MCHC: 32.5 g/dL (ref 30.0–36.0)
MCV: 68.4 fL — ABNORMAL LOW (ref 80.0–100.0)
Platelets: 191 10*3/uL (ref 150–400)
RBC: 5.35 MIL/uL — ABNORMAL HIGH (ref 3.87–5.11)
RDW: 14.6 % (ref 11.5–15.5)
WBC: 9.4 10*3/uL (ref 4.0–10.5)
nRBC: 0 % (ref 0.0–0.2)

## 2020-01-10 LAB — URINE CULTURE

## 2020-01-10 LAB — MRSA PCR SCREENING: MRSA by PCR: NEGATIVE

## 2020-01-10 LAB — MAGNESIUM: Magnesium: 1.5 mg/dL — ABNORMAL LOW (ref 1.7–2.4)

## 2020-01-10 MED ORDER — SODIUM CHLORIDE 0.9 % IV BOLUS
1000.0000 mL | Freq: Once | INTRAVENOUS | Status: AC
  Administered 2020-01-10: 1000 mL via INTRAVENOUS

## 2020-01-10 MED ORDER — POTASSIUM CHLORIDE 10 MEQ/100ML IV SOLN
10.0000 meq | INTRAVENOUS | Status: AC
  Administered 2020-01-10 (×5): 10 meq via INTRAVENOUS
  Filled 2020-01-10 (×5): qty 100

## 2020-01-10 MED ORDER — METHOCARBAMOL 1000 MG/10ML IJ SOLN
1000.0000 mg | Freq: Three times a day (TID) | INTRAVENOUS | Status: DC
  Administered 2020-01-10 – 2020-01-11 (×4): 1000 mg via INTRAVENOUS
  Filled 2020-01-10 (×7): qty 10

## 2020-01-10 MED ORDER — POTASSIUM CHLORIDE CRYS ER 20 MEQ PO TBCR
40.0000 meq | EXTENDED_RELEASE_TABLET | Freq: Once | ORAL | Status: AC
  Administered 2020-01-10: 40 meq via ORAL
  Filled 2020-01-10: qty 2

## 2020-01-10 MED ORDER — SCOPOLAMINE 1 MG/3DAYS TD PT72
1.0000 | MEDICATED_PATCH | TRANSDERMAL | Status: DC
  Administered 2020-01-10: 1.5 mg via TRANSDERMAL
  Filled 2020-01-10: qty 1

## 2020-01-10 MED ORDER — ALUM & MAG HYDROXIDE-SIMETH 200-200-20 MG/5ML PO SUSP
30.0000 mL | ORAL | Status: DC | PRN
  Administered 2020-01-10 – 2020-01-12 (×2): 30 mL via ORAL
  Filled 2020-01-10 (×2): qty 30

## 2020-01-10 NOTE — Progress Notes (Signed)
PROGRESS NOTE    Amanda Garner  NHA:[id number] DOB: 20-Mar-1971 DOA: 01/09/2020 PCP: Patient, No Pcp Per  Brief Narrative:48 y.o. female with medical history significant of history of diverticulitis with abscess, hypertension, acid reflux admitted with abdominal pain that started 1 1-1/2 weeks ago.  Pain is in the lower abdomen and right flank associated with fever nausea loose stools. Her appetite has been decreased with weight loss.  Admitted with nausea and fever of 101.8 and abdominal pain and flank pain. She denied any blood in her stool or urine.  She has dysuria and frequency of urination.  CT of the abdomen acute diverticulitis ED Course: She was febrile at 101.8.  She was started on IV fluids Rocephin and Flagyl.  Surgery has been consulted.  01/10/2020-patient reports her nausea is almost resolved continues to have abdominal pain and right flank pain continues to have frequency of urination with dysuria.  She has not had a bowel movement since admission. T Max of 102.7  Assessment & Plan:   Active Problems:   Acute diverticulitis  #1 acute diverticulitis patient admitted with abdominal pain nausea and diarrhea.  Patient has history of diverticulitis and abscess.  Continue Rocephin and Flagyl n.p.o. and IV fluids.  Dilaudid for pain control.  Appreciate surgery input. 1 out of 4 blood cultures growing E. coli continue the same antibiotics for now.  #2 history of essential hypertension-hydralazine as needed hold p.o. meds.  #3 UTI-patient admitted with fever dysuria and frequency of urination.  UA consistent with UTI with large leukocytes and more than 50 WBCs.  Patient has been started on Rocephin continue the same.  #4 hypokalemia replete and recheck.  Check magnesium level.    Estimated body mass index is 33.57 kg/m as calculated from the following:   Height as of this encounter: 5' 6"  (1.676 m).   Weight as of this encounter: 94.3 kg.  DVT prophylaxis: Lovenox    code Status: Full code Family Communication: Discussed with patient Disposition Plan:  Status is: Inpatient  Dispo: The patient is from: Home              Anticipated d/c is to: Home              Anticipated d/c date is: Unknown              Patient currently is not medically stable to d/c.  Patient admitted with abdominal pain found to have UTI and diverticulitis is n.p.o. on IV fluids and IV antibiotics.   Consultants:   Surgery  Procedures: None Antimicrobials: Rocephin and Flagyl  Subjective: Patient resting in bed no acute distress feels better than yesterday denies any vomiting or nausea continues to have abdominal pain right flank pain dysuria and frequency of urination.  Objective: Vitals:   01/09/20 2130 01/10/20 0129 01/10/20 0534 01/10/20 0731  BP: (!) 164/106 130/76 (!) 157/95 (!) 155/95  Pulse: 95 (!) 105 (!) 109 99  Resp: 18 18 15 17   Temp: 98.5 F (36.9 C) (!) 100.4 F (38 C) (!) 102.7 F (39.3 C) 99.3 F (37.4 C)  TempSrc: Oral Oral Oral Oral  SpO2: 99% 100% 100% 98%  Weight:      Height:        Intake/Output Summary (Last 24 hours) at 01/10/2020 1035 Last data filed at 01/10/2020 0301 Gross per 24 hour  Intake 3462.42 ml  Output --  Net 3462.42 ml   Filed Weights   01/09/20 1444  Weight: 94.3 kg  Examination:  General exam: Appears calm and comfortable  Respiratory system: Clear to auscultation. Respiratory effort normal. Cardiovascular system: S1 & S2 heard, RRR. No JVD, murmurs, rubs, gallops or clicks. No pedal edema. Gastrointestinal system: Abdomen is nondistended, soft and tender lower abdomen and right flank. No organomegaly or masses felt. Normal bowel sounds heard. Central nervous system: Alert and oriented. No focal neurological deficits. Extremities: Symmetric 5 x 5 power. Skin: No rashes, lesions or ulcers Psychiatry: Judgement and insight appear normal. Mood & affect appropriate.     Data Reviewed: I have personally reviewed  following labs and imaging studies  CBC: Recent Labs  Lab 01/09/20 0914 01/09/20 1846 01/10/20 0345  WBC 10.3 10.6* 9.4  NEUTROABS 8.1*  --   --   HGB 13.1 12.0 11.9*  HCT 40.2 37.4 36.6  MCV 68.0* 68.1* 68.4*  PLT 223 196 829   Basic Metabolic Panel: Recent Labs  Lab 01/09/20 0914 01/09/20 1846 01/10/20 0345  NA 139  --  138  K 3.8  --  3.0*  CL 105  --  106  CO2 20*  --  23  GLUCOSE 137*  --  102*  BUN 10  --  6  CREATININE 0.85 0.81 0.75  CALCIUM 9.4  --  8.4*   GFR: Estimated Creatinine Clearance: 98.4 mL/min (by C-G formula based on SCr of 0.75 mg/dL). Liver Function Tests: Recent Labs  Lab 01/09/20 0914 01/10/20 0345  AST 19 15  ALT 16 15  ALKPHOS 68 55  BILITOT 0.9 0.7  PROT 7.2 6.7  ALBUMIN 3.8 3.2*   Recent Labs  Lab 01/09/20 0914  LIPASE 20   No results for input(s): AMMONIA in the last 168 hours. Coagulation Profile: No results for input(s): INR, PROTIME in the last 168 hours. Cardiac Enzymes: No results for input(s): CKTOTAL, CKMB, CKMBINDEX, TROPONINI in the last 168 hours. BNP (last 3 results) No results for input(s): PROBNP in the last 8760 hours. HbA1C: No results for input(s): HGBA1C in the last 72 hours. CBG: No results for input(s): GLUCAP in the last 168 hours. Lipid Profile: No results for input(s): CHOL, HDL, LDLCALC, TRIG, CHOLHDL, LDLDIRECT in the last 72 hours. Thyroid Function Tests: No results for input(s): TSH, T4TOTAL, FREET4, T3FREE, THYROIDAB in the last 72 hours. Anemia Panel: No results for input(s): VITAMINB12, FOLATE, FERRITIN, TIBC, IRON, RETICCTPCT in the last 72 hours. Sepsis Labs: Recent Labs  Lab 01/09/20 1509 01/09/20 1846  LATICACIDVEN 1.0 0.9    Recent Results (from the past 240 hour(s))  Blood Culture (routine x 2)     Status: None (Preliminary result)   Collection Time: 01/09/20  3:06 PM   Specimen: BLOOD  Result Value Ref Range Status   Specimen Description BLOOD RIGHT ANTECUBITAL  Final    Special Requests   Final    BOTTLES DRAWN AEROBIC AND ANAEROBIC Blood Culture adequate volume   Culture  Setup Time   Final    ANAEROBIC BOTTLE ONLY GRAM NEGATIVE RODS CRITICAL RESULT CALLED TO, READ BACK BY AND VERIFIED WITH: Wainwright [place] [id number] Webb Performed at [facility], [place] [address], [place], [place] [address]    Culture GRAM NEGATIVE RODS  Final   Report Status PENDING  Incomplete  Blood Culture ID Panel (Reflexed)     Status: Abnormal   Collection Time: 01/09/20  3:06 PM  Result Value Ref Range Status   Enterococcus species NOT DETECTED NOT DETECTED Final   Listeria monocytogenes NOT DETECTED NOT DETECTED  Final   Staphylococcus species NOT DETECTED NOT DETECTED Final   Staphylococcus aureus (BCID) NOT DETECTED NOT DETECTED Final   Streptococcus species NOT DETECTED NOT DETECTED Final   Streptococcus agalactiae NOT DETECTED NOT DETECTED Final   Streptococcus pneumoniae NOT DETECTED NOT DETECTED Final   Streptococcus pyogenes NOT DETECTED NOT DETECTED Final   Acinetobacter baumannii NOT DETECTED NOT DETECTED Final   Enterobacteriaceae species DETECTED (A) NOT DETECTED Final    Comment: Enterobacteriaceae represent a large family of gram-negative bacteria, not a single organism. CRITICAL RESULT CALLED TO, READ BACK BY AND VERIFIED WITH: PHARMD [place] [id number] FCP    Enterobacter cloacae complex NOT DETECTED NOT DETECTED Final   Escherichia coli DETECTED (A) NOT DETECTED Final    Comment: CRITICAL RESULT CALLED TO, READ BACK BY AND VERIFIED WITH: PHARMD KIM HURTH [phone] FCP    Klebsiella oxytoca NOT DETECTED NOT DETECTED Final   Klebsiella pneumoniae NOT DETECTED NOT DETECTED Final   Proteus species NOT DETECTED NOT DETECTED Final   Serratia marcescens NOT DETECTED NOT DETECTED Final   Carbapenem resistance NOT DETECTED NOT DETECTED Final   Haemophilus influenzae NOT DETECTED NOT DETECTED Final   Neisseria meningitidis NOT DETECTED NOT DETECTED  Final   Pseudomonas aeruginosa NOT DETECTED NOT DETECTED Final   Candida albicans NOT DETECTED NOT DETECTED Final   Candida glabrata NOT DETECTED NOT DETECTED Final   Candida krusei NOT DETECTED NOT DETECTED Final   Candida parapsilosis NOT DETECTED NOT DETECTED Final   Candida tropicalis NOT DETECTED NOT DETECTED Final    Comment: Performed at [facility], [place] [address] [place], [place] [address]  Blood Culture (routine x 2)     Status: None (Preliminary result)   Collection Time: 01/09/20  3:08 PM   Specimen: BLOOD LEFT HAND  Result Value Ref Range Status   Specimen Description BLOOD LEFT HAND  Final   Special Requests   Final    BOTTLES DRAWN AEROBIC AND ANAEROBIC Blood Culture results may not be optimal due to an inadequate volume of blood received in culture bottles   Culture   Final    NO GROWTH < 24 HOURS Performed at [facility], [place] [address] [place], [place] [address]    Report Status PENDING  Incomplete  SARS Coronavirus 2 by RT PCR (hospital order, performed in [facility] hospital lab) Nasopharyngeal Nasopharyngeal Swab     Status: None   Collection Time: 01/09/20  8:48 PM   Specimen: Nasopharyngeal Swab  Result Value Ref Range Status   SARS Coronavirus 2 NEGATIVE NEGATIVE Final    Comment: (NOTE) SARS-CoV-2 target nucleic acids are NOT DETECTED. The SARS-CoV-2 RNA is generally detectable in upper and lower respiratory specimens during the acute phase of infection. The lowest concentration of SARS-CoV-2 viral copies this assay can detect is 250 copies / mL. A negative result does not preclude SARS-CoV-2 infection and should not be used as the sole basis for treatment or other patient management decisions.  A negative result may occur with improper specimen collection / handling, submission of specimen other than nasopharyngeal swab, presence of viral mutation(s) within the areas targeted by this assay, and inadequate number of viral copies (<250  copies / mL). A negative result must be combined with clinical observations, patient history, and epidemiological information. Fact Sheet for Patients:   StrictlyIdeas.no Fact Sheet for Healthcare Providers: BankingDealers.co.za This test is not yet approved or cleared  by the [place] FDA and has been authorized  for detection and/or diagnosis of SARS-CoV-2 by FDA under an Emergency Use Authorization (EUA).  This EUA will remain in effect (meaning this test can be used) for the duration of the COVID-19 declaration under Section 564(b)(1) of the Act, 21 U.S.C. section 360bbb-3(b)(1), unless the authorization is terminated or revoked sooner. Performed at [facility], [place] [address] [place],  [address]          Radiology Studies: CT Renal Stone Study  Result Date: 01/09/2020 CLINICAL DATA:  Left lower quadrant pain x1 week. EXAM: CT ABDOMEN AND PELVIS WITHOUT CONTRAST TECHNIQUE: Multidetector CT imaging of the abdomen and pelvis was performed following the standard protocol without IV contrast. COMPARISON:  May 30, 2017 FINDINGS: Lower chest: No acute abnormality. Hepatobiliary: No focal liver abnormality is seen. No gallstones, gallbladder wall thickening, or biliary dilatation. Pancreas: Unremarkable. No pancreatic ductal dilatation or surrounding inflammatory changes. Spleen: Normal in size without focal abnormality. Adrenals/Urinary Tract: Adrenal glands are unremarkable. Kidneys are normal, without renal calculi or focal lesions. Mild the prominent extrarenal pelvis are seen bilaterally, left greater than right. Bladder is unremarkable. Stomach/Bowel: There is a small hiatal hernia. Appendix appears normal. Mild to moderately inflamed diverticula are seen within the mid to distal sigmoid colon. 1.5 cm and 1.2 cm foci of air attenuation are also seen adjacent to this portion of the sigmoid colon (axial CT image 68, CT  series number 2). Vascular/Lymphatic: No significant vascular findings are present. No enlarged abdominal or pelvic lymph nodes. Reproductive: Uterus and bilateral adnexa are unremarkable. Other: No abdominal wall hernia or abnormality. No abdominopelvic ascites. Musculoskeletal: No acute or significant osseous findings. IMPRESSION: 1. Mild to moderately inflamed diverticula within the mid to distal sigmoid colon, consistent with acute diverticulitis. While the adjacent foci of air attenuation may represent enlarged, inflamed diverticula, small focal areas of contained free air cannot be excluded. 2. Small hiatal hernia. Electronically Signed   By: Virgina Norfolk M.D.   On: 01/09/2020 16:09        Scheduled Meds: . heparin  5,000 Units Subcutaneous Q8H   Continuous Infusions: . sodium chloride 75 mL/hr at 01/09/20 2300  . cefTRIAXone (ROCEPHIN)  IV 2 g (01/10/20 0735)  . methocarbamol (ROBAXIN) IV 1,000 mg (01/10/20 0847)  . metronidazole 500 mg (01/10/20 0959)     LOS: 1 day     Georgette Shell, MD 01/10/2020, 10:35 AM

## 2020-01-10 NOTE — Progress Notes (Signed)
   01/10/20 0534  Vitals  Temp (!) 102.7 F (39.3 C)  Temp Source Oral  BP (!) 157/95  MAP (mmHg) 114  BP Location Right Arm  BP Method Automatic  Patient Position (if appropriate) Lying  Pulse Rate (!) 109  Resp 15  Oxygen Therapy  SpO2 100 %  O2 Device Room Air  MEWS Score  MEWS Temp 2  MEWS Systolic 0  MEWS Pulse 1  MEWS RR 0  MEWS LOC 0  MEWS Score 3  MEWS Score Color Yellow   Pt on yellow MEWS. Denies SOB,  c/o generalized achiness and abdominal pain. PRN Tylenol given for temp 102.7 and PRN Dilaudid given for pain. Charge nurse made aware. Provider on call made aware. Will continue to monitor pt .Marland KitchenMarland Kitchen

## 2020-01-10 NOTE — TOC Initial Note (Addendum)
Transition of Care [facility]) - Initial/Assessment Note    Patient Details  Name: Amanda Garner MRN: [id number] Date of Birth: 05-Oct-1970  Transition of Care [facility] CM/SW Contact:    Marilu Favre, RN Phone Number: 01/10/2020, 12:00 PM  Clinical Narrative:                  Patient from home with husband. Confirmed face sheet information. Patient has insurance however unsure of insurance company name. Patient will ask husband to bring in insurance card. NCM asked patient to call admitting once she has insurance information so it can be entered in Epic.   Patient also has prescription coverage.   Explained patient can call number on insurance card and be provided a list of MD's in network. Patient voiced understanding. She would like a f/u appointment at one of the [facility]. NCM will call and place information on AVS  Follow up appointment is at [facility] on January 31, 2020 at 0900 am . Expected Discharge Plan: Home/Self Care     Patient Goals and CMS Choice Patient states their goals for this hospitalization and ongoing recovery are:: to return to home CMS Medicare.gov Compare Post Acute Care list provided to:: Patient Choice offered to / list presented to : NA  Expected Discharge Plan and Services Expected Discharge Plan: Home/Self Care   Discharge Planning Services: CM Consult   Living arrangements for the past 2 months: Single Family Home                 DME Arranged: N/A DME Agency: NA       HH Arranged: NA          Prior Living Arrangements/Services Living arrangements for the past 2 months: Single Family Home Lives with:: Spouse Patient language and need for interpreter reviewed:: Yes Do you feel safe going back to the place where you live?: Yes      Need for Family Participation in Patient Care: Yes (Comment) Care giver support system in place?: Yes (comment)   Criminal Activity/Legal Involvement Pertinent to Current  Situation/Hospitalization: No - Comment as needed  Activities of Daily Living      Permission Sought/Granted   Permission granted to share information with : No              Emotional Assessment Appearance:: Other (Comment Required(patient had head covered with blanket) Attitude/Demeanor/Rapport: Engaged Affect (typically observed): Accepting Orientation: : Oriented to Self, Oriented to Place, Oriented to  Time, Oriented to Situation Alcohol / Substance Use: Not Applicable Psych Involvement: No (comment)  Admission diagnosis:  Acute diverticulitis [K57.92] Sepsis without acute organ dysfunction, due to unspecified organism [facility]) [A41.9] Patient Active Problem List   Diagnosis Date Noted  . Hypokalemia 01/10/2020  . Acute lower UTI 01/10/2020  . Acute diverticulitis 01/09/2020  . Sepsis without acute organ dysfunction ([place])   . Diverticulitis of large intestine with abscess without bleeding 06/07/2017  . Dysuria   . Abscess of sigmoid colon due to diverticulitis 05/24/2017  . Sigmoid diverticulitis 05/11/2017  . Tobacco dependence 05/11/2017  . Marijuana abuse 05/11/2017  . Essential hypertension 05/11/2017  . Reflux esophagitis 05/11/2017  . Tinea pedis of left foot 04/11/2015  . Blister of foot with infection 04/11/2015  . Diverticulitis 09/07/2012  . C. difficile colitis 08/26/2011  . Abdominal pain, other specified site 08/26/2011  . Diarrhea 08/26/2011  . Abnormal finding on GI tract imaging 08/26/2011  . Enteritis 08/25/2011   PCP:  Patient, No  Pcp Per Pharmacy:   [facility] [address] [place]), [address] [place]) [place] [address] Phone: [phone] Fax: [phone]     Social Determinants of Health (SDOH) Interventions    Readmission Risk Interventions No flowsheet data found.

## 2020-01-10 NOTE — Consult Note (Addendum)
[facility] Consult  Note  Amanda Garner 1971-06-20  [id number].    Requesting MD: Landis Gandy  Chief Complaint:  LLQ pain with hx of diverticulitis Reason for Consult: Diverticulitis with possible perforation  HPI: Patient is a 49 year old female who presented with left lower quadrant abdominal pain radiating to the right.  She said symptoms started about 1-1/2 weeks ago.  She has had fever with nausea, runny stools just urinary and urinary frequency.  She was also seen on 11/26/2019, with dental pain.  She has seen her dentist and was on amoxicillin and was scheduled to go back the next day for further work.  She was discharged home on clindamycin for 3 days, and then follow-up with her dentist.  We saw the patient on 05/11/2017, with acute diverticulitis with microperforation.  At that time she had sigmoid diverticulitis with a 2.5 x 2.2 x 2.6 cm pericolonic abscess this was treated with medical management, with subsequent follow-up with Dr. Luis Abed GI, and then follow-up was to be scheduled with our office.  She was readmitted 05/24/2017-05/31/2017, again with diverticulitis and an abscess that was too small to drain.  It was managed medically.  Follow-up at Dr. Vena Rua office on 06/07/2017 showed good improvement.  She reports she did not follow-up after her last bout with colonoscopy.    There is also a report of Crohn's disease.  She told Dr. Brantley Stage that she was supposed to have an operation for it back in 2018.  Patient does not remember any of the details of this nor with whom she was seeing for this.  There is a CT on 10/19/16, that lists recurrent abdominal pain with a history of Crohn's as clinical data.  CT scan at that time showed left colonic diverticulosis with an area of wall thickening mid sigmoid and features consistent with diverticulitis.  There was no small bowel abnormality or changes in the terminal ileum.  There are also CTs dated 07/07/2012 and 09/07/2012  both of which report findings consistent with diverticulitis, umbilical hernia containing fat, but no evidence of small bowel disease.  Work-up in the ED 01/09/2020: Patient came in with a fever 101.7.  She has at least 4 febrile episodes recorded the highest is 1-2.7 at 3 AM.  Blood pressure slightly elevated heart rate in the 90-100 range. Admission labs yesterday shows an essentially normal CMP except for CO2 of 20 and a glucose of 137.  WBC 10.3, hemoglobin 13.1, hematocrit 40.2, platelets 223,000.  Repeat labs this a.m. shows a potassium of 3.0, glucose of 102 the remainder of the CMP is normal, WBC 9.4, H/H 11.9/36.6.  Urinalysis showed greater than 50 WBC/HPF, 21-50 RBC/HPF.  Blood culture: Enterobacteriaceae species NOT DETECTED DETECTEDAbnormal     Escherichia coli NOT DETECTED DETECTEDAbnormal    Additional blood cultures, urine cultures pending. CT renal stone study: Mild to moderate inflamed diverticuli within the mid to distal sigmoid colon consistent with acute diverticulitis.  There is some adjacent foci of air attenuation which may represent enlarged/inflamed diverticuli, small focal area of contained free air could not be excluded.  She also had a small hiatal hernia.  ROS: Review of Systems  Constitutional: Positive for fever and weight loss (hasn't been able to eat for a week).  HENT: Negative.   Eyes: Negative.   Respiratory: Positive for shortness of breath (DOE walking) and wheezing.   Cardiovascular: Negative.   Gastrointestinal: Positive for abdominal pain, diarrhea, heartburn and nausea. Negative for blood in stool, constipation, melena  and vomiting.  Genitourinary: Negative.   Musculoskeletal: Negative.   Skin: Negative.   Neurological: Negative.   Endo/Heme/Allergies: Negative.   Psychiatric/Behavioral: Negative.     Family History  Problem Relation Age of Onset  . Hypertension Mother   . Diabetes Mother   . Diabetes type II Mother   . Diabetes type II Father    . Stroke Father   . Lung cancer Father 58  . Colon cancer Neg Hx     Past Medical History:  Diagnosis Date  . Acid reflux   . Anemia Dx: 2001 or so   . Blood transfusion   . Crohn disease ([place])   . Crohn's disease ([place])   . Diverticulitis   . Hypertension   . Sickle cell trait [facility])     Past Surgical History:  Procedure Laterality Date  . ABDOMINAL HYSTERECTOMY  2001   partial hysterectomy - emergency during last C-section  . [place]; 1997; 2001  . CESAREAN SECTION      Social History:  reports that she has been smoking cigarettes. She has a 15.50 pack-year smoking history. She has never used smokeless tobacco. She reports current alcohol use. She reports that she does not use drugs. Tobacco: Ongoing use x28 years <1PPD EtOH: Weekly use Drugs: Occasional marijuana Allergies: No Known Allergies  Medications Prior to Admission  Medication Sig Dispense Refill  . cimetidine (TAGAMET) 200 MG tablet Take 200 mg by mouth 4 (four) times daily.    Marland Kitchen ibuprofen (ADVIL) 800 MG tablet Take 800 mg by mouth daily as needed for moderate pain.     Marland Kitchen albuterol (VENTOLIN HFA) 108 (90 Base) MCG/ACT inhaler Inhale 1-2 puffs into the lungs every 6 (six) hours as needed for wheezing or shortness of breath. (Patient not taking: Reported on 01/09/2020) 6.7 g 0  . amLODipine (NORVASC) 5 MG tablet Take 1 tablet (5 mg total) by mouth daily. (Patient not taking: Reported on 01/09/2020) 30 tablet 2  . clindamycin (CLEOCIN) 300 MG capsule Take 1 capsule (300 mg total) by mouth 3 (three) times daily. (Patient not taking: Reported on 01/09/2020) 21 capsule 0  . fluticasone (FLONASE) 50 MCG/ACT nasal spray Place 1 spray into both nostrils daily. (Patient not taking: Reported on 01/09/2020) 16 g 0  . HYDROcodone-acetaminophen (NORCO) 7.5-325 MG tablet Take 1 tablet by mouth every 6 (six) hours as needed for moderate pain. (Patient not taking: Reported on 01/09/2020) 15 tablet 0  . nicotine  (NICODERM CQ - DOSED IN MG/24 HOURS) 14 mg/24hr patch Place 1 patch (14 mg total) onto the skin daily. (Patient not taking: Reported on 01/09/2020) 30 patch 1  . pantoprazole (PROTONIX) 40 MG tablet Take 1 tablet (40 mg total) by mouth daily at 12 noon. (Patient not taking: Reported on 01/09/2020) 30 tablet 0    Blood pressure (!) 155/95, pulse 99, temperature 99.3 F (37.4 C), temperature source Oral, resp. rate 17, height 5' 6"  (1.676 m), weight 94.3 kg, last menstrual period 08/20/2011, SpO2 98 %. Physical Exam:  General: pleasant, WD, AA woman who is laying in bed in NAD HEENT: head is normocephalic, atraumatic.  Sclera are noninjected.  Pupils are equal.  Ears and nose without any masses or lesions.  Mouth is pink and moist Heart: regular, rate, and rhythm.  Normal s1,s2. No obvious murmurs, gallops, or rubs noted.  Palpable radial and pedal pulses bilaterally Lungs: CTAB, no wheezes, rhonchi, or rales noted.  Respiratory effort nonlabored Abd: soft,large abdomen, Tender on left  side.  LLQ>LUQ, + BS, NO BM since 01/07/20.  She was having diarrhea prior to that. MS: all 4 extremities are symmetrical with no cyanosis, clubbing, or edema. Skin: warm and dry with no masses, lesions, or rashes Neuro: Cranial nerves 2-12 grossly intact, sensation is normal throughout Psych: A&Ox3 with an appropriate affect.   Results for orders placed or performed during the hospital encounter of 01/09/20 (from the past 48 hour(s))  Comprehensive metabolic panel     Status: Abnormal   Collection Time: 01/09/20  9:14 AM  Result Value Ref Range   Sodium 139 135 - 145 mmol/L   Potassium 3.8 3.5 - 5.1 mmol/L   Chloride 105 98 - 111 mmol/L   CO2 20 (L) 22 - 32 mmol/L   Glucose, Bld 137 (H) 70 - 99 mg/dL    Comment: Glucose reference range applies only to samples taken after fasting for at least 8 hours.   BUN 10 6 - 20 mg/dL   Creatinine, Ser 0.85 0.44 - 1.00 mg/dL   Calcium 9.4 8.9 - 10.3 mg/dL   Total Protein  7.2 6.5 - 8.1 g/dL   Albumin 3.8 3.5 - 5.0 g/dL   AST 19 15 - 41 U/L   ALT 16 0 - 44 U/L   Alkaline Phosphatase 68 38 - 126 U/L   Total Bilirubin 0.9 0.3 - 1.2 mg/dL   GFR calc non Af Amer >60 >60 mL/min   GFR calc Af Amer >60 >60 mL/min   Anion gap 14 5 - 15    Comment: Performed at [facility], [place] [address] [place], [place] [address]  Lipase, blood     Status: None   Collection Time: 01/09/20  9:14 AM  Result Value Ref Range   Lipase 20 11 - 51 U/L    Comment: Performed at [facility] [address], [place], [place] [address]  CBC with Diff     Status: Abnormal   Collection Time: 01/09/20  9:14 AM  Result Value Ref Range   WBC 10.3 4.0 - 10.5 K/uL   RBC 5.91 (H) 3.87 - 5.11 MIL/uL   Hemoglobin 13.1 12.0 - 15.0 g/dL   HCT 40.2 36.0 - 46.0 %   MCV 68.0 (L) 80.0 - 100.0 fL   MCH 22.2 (L) 26.0 - 34.0 pg   MCHC 32.6 30.0 - 36.0 g/dL   RDW 14.4 11.5 - 15.5 %   Platelets 223 150 - 400 K/uL   nRBC 0.0 0.0 - 0.2 %   Neutrophils Relative % 79 %   Neutro Abs 8.1 (H) 1.7 - 7.7 K/uL   Lymphocytes Relative 13 %   Lymphs Abs 1.3 0.7 - 4.0 K/uL   Monocytes Relative 5 %   Monocytes Absolute 0.5 0.1 - 1.0 K/uL   Eosinophils Relative 2 %   Eosinophils Absolute 0.2 0.0 - 0.5 K/uL   Basophils Relative 0 %   Basophils Absolute 0.0 0.0 - 0.1 K/uL   WBC Morphology See Note     Comment: >10% reactive, Benign Lymphocytes.   Immature Granulocytes 1 %   Abs Immature Granulocytes 0.05 0.00 - 0.07 K/uL    Comment: Performed at [facility], [place] [address] [place], [place] [address]  I-Stat beta hCG blood, ED     Status: None   Collection Time: 01/09/20  9:42 AM  Result Value Ref Range   I-stat hCG, quantitative <5.0 <5 mIU/mL   Comment 3  Comment:   GEST. AGE      CONC.  (mIU/mL)   <=1 WEEK        5 - 50     2 WEEKS       50 - 500     3 WEEKS       100 - 10,000     4 WEEKS     1,000 - 30,000        FEMALE AND NON-PREGNANT FEMALE:     LESS THAN 5 mIU/mL    Urinalysis, Routine w reflex microscopic     Status: Abnormal   Collection Time: 01/09/20  9:59 AM  Result Value Ref Range   Color, Urine YELLOW YELLOW   APPearance CLOUDY (A) CLEAR   Specific Gravity, Urine 1.010 1.005 - 1.030   pH 5.0 5.0 - 8.0   Glucose, UA NEGATIVE NEGATIVE mg/dL   Hgb urine dipstick MODERATE (A) NEGATIVE   Bilirubin Urine NEGATIVE NEGATIVE   Ketones, ur NEGATIVE NEGATIVE mg/dL   Protein, ur 30 (A) NEGATIVE mg/dL   Nitrite NEGATIVE NEGATIVE   Leukocytes,Ua LARGE (A) NEGATIVE   RBC / HPF 21-50 0 - 5 RBC/hpf   WBC, UA >50 (H) 0 - 5 WBC/hpf   Bacteria, UA RARE (A) NONE SEEN   Squamous Epithelial / LPF 0-5 0 - 5   WBC Clumps PRESENT    Mucus PRESENT     Comment: Performed at [facility], [place] [address] [place], [place] [address]  Blood Culture (routine x 2)     Status: None (Preliminary result)   Collection Time: 01/09/20  3:06 PM   Specimen: BLOOD  Result Value Ref Range   Specimen Description BLOOD RIGHT ANTECUBITAL    Special Requests      BOTTLES DRAWN AEROBIC AND ANAEROBIC Blood Culture adequate volume   Culture  Setup Time      ANAEROBIC BOTTLE ONLY GRAM NEGATIVE RODS CRITICAL RESULT CALLED TO, READ BACK BY AND VERIFIED WITH: Maryann Alar [phone] [place] Performed at [facility], [place] [address], [place], [place] [address]    Culture GRAM NEGATIVE RODS    Report Status PENDING   Blood Culture ID Panel (Reflexed)     Status: Abnormal   Collection Time: 01/09/20  3:06 PM  Result Value Ref Range   Enterococcus species NOT DETECTED NOT DETECTED   Listeria monocytogenes NOT DETECTED NOT DETECTED   Staphylococcus species NOT DETECTED NOT DETECTED   Staphylococcus aureus (BCID) NOT DETECTED NOT DETECTED   Streptococcus species NOT DETECTED NOT DETECTED   Streptococcus agalactiae NOT DETECTED NOT DETECTED   Streptococcus pneumoniae NOT DETECTED NOT DETECTED   Streptococcus pyogenes NOT DETECTED NOT DETECTED   Acinetobacter baumannii NOT  DETECTED NOT DETECTED   Enterobacteriaceae species DETECTED (A) NOT DETECTED    Comment: Enterobacteriaceae represent a large family of gram-negative bacteria, not a single organism. CRITICAL RESULT CALLED TO, READ BACK BY AND VERIFIED WITH: PHARMD [place] [phone] FCP    Enterobacter cloacae complex NOT DETECTED NOT DETECTED   Escherichia coli DETECTED (A) NOT DETECTED    Comment: CRITICAL RESULT CALLED TO, READ BACK BY AND VERIFIED WITH: PHARMD KIM HURTH [phone] FCP    Klebsiella oxytoca NOT DETECTED NOT DETECTED   Klebsiella pneumoniae NOT DETECTED NOT DETECTED   Proteus species NOT DETECTED NOT DETECTED   Serratia marcescens NOT DETECTED NOT DETECTED   Carbapenem resistance NOT DETECTED NOT DETECTED   Haemophilus influenzae NOT DETECTED NOT DETECTED  Neisseria meningitidis NOT DETECTED NOT DETECTED   Pseudomonas aeruginosa NOT DETECTED NOT DETECTED   Candida albicans NOT DETECTED NOT DETECTED   Candida glabrata NOT DETECTED NOT DETECTED   Candida krusei NOT DETECTED NOT DETECTED   Candida parapsilosis NOT DETECTED NOT DETECTED   Candida tropicalis NOT DETECTED NOT DETECTED    Comment: Performed at [facility], [place] [address] [place], [place] [address]  Blood Culture (routine x 2)     Status: None (Preliminary result)   Collection Time: 01/09/20  3:08 PM   Specimen: BLOOD LEFT HAND  Result Value Ref Range   Specimen Description BLOOD LEFT HAND    Special Requests      BOTTLES DRAWN AEROBIC AND ANAEROBIC Blood Culture results may not be optimal due to an inadequate volume of blood received in culture bottles   Culture      NO GROWTH < 24 HOURS Performed at [facility] [address], [place], [place] [address]    Report Status PENDING   Lactic acid, plasma     Status: None   Collection Time: 01/09/20  3:09 PM  Result Value Ref Range   Lactic Acid, Venous 1.0 0.5 - 1.9 mmol/L    Comment: Performed at [facility], [place] [address],  [place], [place] [address]  Lactic acid, plasma     Status: None   Collection Time: 01/09/20  6:46 PM  Result Value Ref Range   Lactic Acid, Venous 0.9 0.5 - 1.9 mmol/L    Comment: Performed at [facility] [address], [place], [place] [address]  HIV Antibody (routine testing w rflx)     Status: None   Collection Time: 01/09/20  6:46 PM  Result Value Ref Range   HIV Screen 4th Generation wRfx Non Reactive Non Reactive    Comment: Performed at [facility], [place] [address] [place], [place] [address]  CBC     Status: Abnormal   Collection Time: 01/09/20  6:46 PM  Result Value Ref Range   WBC 10.6 (H) 4.0 - 10.5 K/uL   RBC 5.49 (H) 3.87 - 5.11 MIL/uL   Hemoglobin 12.0 12.0 - 15.0 g/dL   HCT 37.4 36.0 - 46.0 %   MCV 68.1 (L) 80.0 - 100.0 fL   MCH 21.9 (L) 26.0 - 34.0 pg   MCHC 32.1 30.0 - 36.0 g/dL   RDW 14.6 11.5 - 15.5 %   Platelets 196 150 - 400 K/uL    Comment: REPEATED TO VERIFY   nRBC 0.0 0.0 - 0.2 %    Comment: Performed at [facility], [place] [address], [place], [place] [address]  Creatinine, serum     Status: None   Collection Time: 01/09/20  6:46 PM  Result Value Ref Range   Creatinine, Ser 0.81 0.44 - 1.00 mg/dL   GFR calc non Af Amer >60 >60 mL/min   GFR calc Af Amer >60 >60 mL/min    Comment: Performed at [facility] [address], [place], [place] [address]  SARS Coronavirus 2 by RT PCR (hospital order, performed in [facility] hospital lab) Nasopharyngeal Nasopharyngeal Swab     Status: None   Collection Time: 01/09/20  8:48 PM   Specimen: Nasopharyngeal Swab  Result Value Ref Range   SARS Coronavirus 2 NEGATIVE NEGATIVE    Comment: (NOTE) SARS-CoV-2 target nucleic acids are NOT DETECTED. The SARS-CoV-2 RNA is generally detectable in upper and lower respiratory  specimens during the acute phase of infection. The lowest concentration of SARS-CoV-2 viral copies this assay can detect is 250 copies / mL. A negative result does not preclude  SARS-CoV-2 infection and should not be used as the sole basis for treatment or other patient management decisions.  A negative result may occur with improper specimen collection / handling, submission of specimen other than nasopharyngeal swab, presence of viral mutation(s) within the areas targeted by this assay, and inadequate number of viral copies (<250 copies / mL). A negative result must be combined with clinical observations, patient history, and epidemiological information. Fact Sheet for Patients:   StrictlyIdeas.no Fact Sheet for Healthcare Providers: BankingDealers.co.za This test is not yet approved or cleared  by the [place] FDA and has been authorized for detection and/or diagnosis of SARS-CoV-2 by FDA under an Emergency Use Authorization (EUA).  This EUA will remain in effect (meaning this test can be used) for the duration of the COVID-19 declaration under Section 564(b)(1) of the Act, 21 U.S.C. section 360bbb-3(b)(1), unless the authorization is terminated or revoked sooner. Performed at [facility], [place] [address] [place], [place] [address]   Comprehensive metabolic panel     Status: Abnormal   Collection Time: 01/10/20  3:45 AM  Result Value Ref Range   Sodium 138 135 - 145 mmol/L   Potassium 3.0 (L) 3.5 - 5.1 mmol/L   Chloride 106 98 - 111 mmol/L   CO2 23 22 - 32 mmol/L   Glucose, Bld 102 (H) 70 - 99 mg/dL    Comment: Glucose reference range applies only to samples taken after fasting for at least 8 hours.   BUN 6 6 - 20 mg/dL   Creatinine, Ser 0.75 0.44 - 1.00 mg/dL   Calcium 8.4 (L) 8.9 - 10.3 mg/dL   Total Protein 6.7 6.5 - 8.1 g/dL   Albumin 3.2 (L) 3.5 - 5.0 g/dL   AST 15 15 - 41 U/L   ALT 15 0 - 44 U/L   Alkaline Phosphatase 55 38 - 126 U/L   Total Bilirubin 0.7 0.3 - 1.2 mg/dL   GFR calc non Af Amer >60 >60 mL/min   GFR calc Af Amer >60 >60 mL/min   Anion gap 9 5 - 15    Comment:  Performed at [facility], [place] [address] [place], [place] [address]  CBC     Status: Abnormal   Collection Time: 01/10/20  3:45 AM  Result Value Ref Range   WBC 9.4 4.0 - 10.5 K/uL   RBC 5.35 (H) 3.87 - 5.11 MIL/uL   Hemoglobin 11.9 (L) 12.0 - 15.0 g/dL   HCT 36.6 36.0 - 46.0 %   MCV 68.4 (L) 80.0 - 100.0 fL    Comment: REPEATED TO VERIFY   MCH 22.2 (L) 26.0 - 34.0 pg   MCHC 32.5 30.0 - 36.0 g/dL   RDW 14.6 11.5 - 15.5 %   Platelets 191 150 - 400 K/uL    Comment: REPEATED TO VERIFY   nRBC 0.0 0.0 - 0.2 %    Comment: Performed at [facility], [place] [address], [place], [place] [address]   CT Renal Stone Study  Result Date: 01/09/2020 CLINICAL DATA:  Left lower quadrant pain x1 week. EXAM: CT ABDOMEN AND PELVIS WITHOUT CONTRAST TECHNIQUE: Multidetector CT imaging of the abdomen and pelvis was performed following the standard protocol without IV contrast. COMPARISON:  May 30, 2017 FINDINGS: Lower chest: No acute abnormality. Hepatobiliary: No  focal liver abnormality is seen. No gallstones, gallbladder wall thickening, or biliary dilatation. Pancreas: Unremarkable. No pancreatic ductal dilatation or surrounding inflammatory changes. Spleen: Normal in size without focal abnormality. Adrenals/Urinary Tract: Adrenal glands are unremarkable. Kidneys are normal, without renal calculi or focal lesions. Mild the prominent extrarenal pelvis are seen bilaterally, left greater than right. Bladder is unremarkable. Stomach/Bowel: There is a small hiatal hernia. Appendix appears normal. Mild to moderately inflamed diverticula are seen within the mid to distal sigmoid colon. 1.5 cm and 1.2 cm foci of air attenuation are also seen adjacent to this portion of the sigmoid colon (axial CT image 68, CT series number 2). Vascular/Lymphatic: No significant vascular findings are present. No enlarged abdominal or pelvic lymph nodes. Reproductive: Uterus and bilateral adnexa are unremarkable. Other: No  abdominal wall hernia or abnormality. No abdominopelvic ascites. Musculoskeletal: No acute or significant osseous findings. IMPRESSION: 1. Mild to moderately inflamed diverticula within the mid to distal sigmoid colon, consistent with acute diverticulitis. While the adjacent foci of air attenuation may represent enlarged, inflamed diverticula, small focal areas of contained free air cannot be excluded. 2. Small hiatal hernia. Electronically Signed   By: Virgina Norfolk M.D.   On: 01/09/2020 16:09   . sodium chloride 75 mL/hr at 01/09/20 2300  . cefTRIAXone (ROCEPHIN)  IV 2 g (01/10/20 0735)  . methocarbamol (ROBAXIN) IV 1,000 mg (01/10/20 0847)  . metronidazole 500 mg (01/10/20 0108)     Assessment/Plan Hypertension   Recent dental infection - no follow up with dentist GERD Hx sickle cell trait Hx Crohn's disease - I cannot find documentation or hx to support this. -  Care Everywhere sigmoid diverticulitis with possible colovesicular fistula,possible colovaginal fistula     11/24/15 Dr. [place]mar [place] Hx tobacco use/marijuana use Hx C. difficile colitis 08/2011  Recurrent sigmoid diverticulitis with possible microperforation. Enterobacter/E coli bacteremia  Probable UTI   FEN: IV fluids/n.p.o. ID: Rocephin/Flagyl 5/19; Ceftriaxone/Flagyl 5/20 >> DVT: Heparin Follow up:  TBD  Plan:  Right now we need to continue antibiotics, bowel rest.  Will discuss further evaluation with Dr. Bobbye Morton.  I am not sure a CT with contrast would help [place] to better define her current anatomy right now. Will await urine culture also.   Earnstine [facility] 01/10/2020, 8:26 AM Please see Amion for pager number during day hours 7:00am-4:30pm

## 2020-01-10 NOTE — Progress Notes (Signed)
   01/10/20 1327  Assess: MEWS Score  Temp (!) 103.1 F (39.5 C)  BP (!) 170/87  Pulse Rate (!) 117  Resp 16  O2 Device Room Air  Assess: MEWS Score  MEWS Temp 2  MEWS Systolic 0  MEWS Pulse 2  MEWS RR 1  MEWS LOC 0  MEWS Score 5  MEWS Score Color Red  Assess: if the MEWS score is Yellow or Red  Were vital signs taken at a resting state? Yes  Focused Assessment Documented focused assessment  Early Detection of Sepsis Score *See Row Information* High  MEWS guidelines implemented *See Row Information* Yes  Treat  MEWS Interventions Administered prn meds/treatments  Take Vital Signs  Increase Vital Sign Frequency  Red: Q 1hr X 4 then Q 4hr X 4, if remains red, continue Q 4hrs  Escalate  MEWS: Escalate Red: discuss with charge nurse/RN and provider, consider discussing with RRT  Notify: Charge Nurse/RN  Name of Charge Nurse/RN Notified Joaquim Lai  Date Charge Nurse/RN Notified 01/10/20  Time Charge Nurse/RN Notified 6301  Notify: Provider  Provider Name/Title Ina Homes, MD  Date Provider Notified 01/10/20  Time Provider Notified 1330  Notification Type Page  Notification Reason Change in status  Response See new orders;Other (Comment) (bolus NS given)  Date of Provider Response 01/10/20  Time of Provider Response 1345  Notify: Rapid Response  Name of Rapid Response RN Notified Helle  Date Rapid Response Notified 01/10/20  Time Rapid Response Notified 6010   MEWS RED protocol implemented.  MD and Rapid Response were notified. PT was initially treated with tylenol 650 mg, ice packs and hydralazine.  Per MD to administer IV NS bolus and dilaudid 62m for pain. Will continue to monitor.

## 2020-01-10 NOTE — Progress Notes (Signed)
   01/10/20 2127  Assess: MEWS Score  Temp 99.7 F (37.6 C)  BP (!) 154/97  Pulse Rate (!) 112  Resp 19  SpO2 98 %  O2 Device Room Air  Assess: MEWS Score  MEWS Temp 0  MEWS Systolic 0  MEWS Pulse 2  MEWS RR 1  MEWS LOC 0  MEWS Score 3  MEWS Score Color Yellow  Assess: if the MEWS score is Yellow or Red  Were vital signs taken at a resting state? Yes  Focused Assessment Documented focused assessment  Early Detection of Sepsis Score *See Row Information* High  MEWS guidelines implemented *See Row Information* No, previously red, continue vital signs every 4 hours  Treat  MEWS Interventions Other (Comment)  Take Vital Signs  Increase Vital Sign Frequency  Red: Q 1hr X 4 then Q 4hr X 4, if remains red, continue Q 4hrs  Escalate  MEWS: Escalate Red: discuss with charge nurse/RN and provider, consider discussing with RRT  Notify: Charge Nurse/RN  Name of Charge Nurse/RN Notified [place]  Date Charge Nurse/RN Notified 01/10/20  Time Charge Nurse/RN Notified 2127  Notify: Provider  Provider Name/Title Myrna Blazer  Date Provider Notified 01/10/20  Time Provider Notified 2142  Notification Type Page  Notification Reason Other (Comment) (RED mEWs)  Response No new orders  Date of Provider Response 01/10/20  Time of Provider Response 2200  Notify: Rapid Response  Name of Rapid Response RN Notified Mindy  Date Rapid Response Notified 01/10/20  Time Rapid Response Notified 2243   MEWS red protocol continued. MD and Rapid response RN aware.No new orders. Will continue to monitor pt.

## 2020-01-10 NOTE — Progress Notes (Signed)
PHARMACY - PHYSICIAN COMMUNICATION CRITICAL VALUE ALERT - BLOOD CULTURE IDENTIFICATION (BCID)  Amanda Garner is an 49 y.o. female who presented to [facility] on 01/09/2020 with a chief complaint of abdominal pain with flank pain.   Assessment:  CT abdomen showing acute diverticulitis. WBC 9.4, temp 102.7. Started on ceftriaxone and metronidazole. BCID showing 1/4 GNR- E Coli (no KPC).   Name of physician (or Provider) Contacted: Dr Rodena Piety  Current antibiotics: Ceftriaxone and metronidazole.  Changes to prescribed antibiotics recommended:  On ceftriaxone which would cover BCID results - can continue intra-abdominal coverage with metronidazole per surgery recommendations.   Results for orders placed or performed during the hospital encounter of 01/09/20  Blood Culture ID Panel (Reflexed) (Collected: 01/09/2020  3:06 PM)  Result Value Ref Range   Enterococcus species NOT DETECTED NOT DETECTED   Listeria monocytogenes NOT DETECTED NOT DETECTED   Staphylococcus species NOT DETECTED NOT DETECTED   Staphylococcus aureus (BCID) NOT DETECTED NOT DETECTED   Streptococcus species NOT DETECTED NOT DETECTED   Streptococcus agalactiae NOT DETECTED NOT DETECTED   Streptococcus pneumoniae NOT DETECTED NOT DETECTED   Streptococcus pyogenes NOT DETECTED NOT DETECTED   Acinetobacter baumannii NOT DETECTED NOT DETECTED   Enterobacteriaceae species DETECTED (A) NOT DETECTED   Enterobacter cloacae complex NOT DETECTED NOT DETECTED   Escherichia coli DETECTED (A) NOT DETECTED   Klebsiella oxytoca NOT DETECTED NOT DETECTED   Klebsiella pneumoniae NOT DETECTED NOT DETECTED   Proteus species NOT DETECTED NOT DETECTED   Serratia marcescens NOT DETECTED NOT DETECTED   Carbapenem resistance NOT DETECTED NOT DETECTED   Haemophilus influenzae NOT DETECTED NOT DETECTED   Neisseria meningitidis NOT DETECTED NOT DETECTED   Pseudomonas aeruginosa NOT DETECTED NOT DETECTED   Candida albicans NOT DETECTED NOT  DETECTED   Candida glabrata NOT DETECTED NOT DETECTED   Candida krusei NOT DETECTED NOT DETECTED   Candida parapsilosis NOT DETECTED NOT DETECTED   Candida tropicalis NOT DETECTED NOT DETECTED    Antonietta Jewel, PharmD, BCCCP Clinical Pharmacist  01/10/2020 6:58 AM  Please check AMION for all [facility] phone numbers After 10:00 PM, call [facility] [phone]

## 2020-01-11 LAB — CBC
HCT: 38.5 % (ref 36.0–46.0)
Hemoglobin: 12.3 g/dL (ref 12.0–15.0)
MCH: 21.8 pg — ABNORMAL LOW (ref 26.0–34.0)
MCHC: 31.9 g/dL (ref 30.0–36.0)
MCV: 68.4 fL — ABNORMAL LOW (ref 80.0–100.0)
Platelets: 203 10*3/uL (ref 150–400)
RBC: 5.63 MIL/uL — ABNORMAL HIGH (ref 3.87–5.11)
RDW: 14.7 % (ref 11.5–15.5)
WBC: 6.3 10*3/uL (ref 4.0–10.5)
nRBC: 0 % (ref 0.0–0.2)

## 2020-01-11 LAB — PHOSPHORUS: Phosphorus: 2 mg/dL — ABNORMAL LOW (ref 2.5–4.6)

## 2020-01-11 LAB — BASIC METABOLIC PANEL
Anion gap: 12 (ref 5–15)
BUN: <5 mg/dL — ABNORMAL LOW (ref 6–20)
CO2: 20 mmol/L — ABNORMAL LOW (ref 22–32)
Calcium: 8.7 mg/dL — ABNORMAL LOW (ref 8.9–10.3)
Chloride: 108 mmol/L (ref 98–111)
Creatinine, Ser: 0.69 mg/dL (ref 0.44–1.00)
GFR calc Af Amer: >60 mL/min (ref >60)
GFR calc non Af Amer: >60 mL/min (ref >60)
Glucose, Bld: 86 mg/dL (ref 70–99)
Potassium: 3.8 mmol/L (ref 3.5–5.1)
Sodium: 140 mmol/L (ref 135–145)

## 2020-01-11 LAB — MAGNESIUM: Magnesium: 1.8 mg/dL (ref 1.7–2.4)

## 2020-01-11 MED ORDER — KETOROLAC TROMETHAMINE 15 MG/ML IJ SOLN
15.0000 mg | Freq: Once | INTRAMUSCULAR | Status: AC
  Administered 2020-01-11: 15 mg via INTRAVENOUS
  Filled 2020-01-11: qty 1

## 2020-01-11 MED ORDER — SACCHAROMYCES BOULARDII 250 MG PO CAPS
250.0000 mg | ORAL_CAPSULE | Freq: Two times a day (BID) | ORAL | Status: DC
  Administered 2020-01-11 – 2020-01-13 (×5): 250 mg via ORAL
  Filled 2020-01-11 (×5): qty 1

## 2020-01-11 NOTE — Care Management (Addendum)
Consult for PCP and Also has questions about applying for Medicaid.  Saw patient 01/10/20 and arranged follow up appointment at [facility] for January 31, 2020 and placed on AVS. At that time patient told NCM she had insurance and would get her husband to bring her insurance card.   Sent email to [employer] with med assist to answer Medicaid.   Magdalen Spatz

## 2020-01-11 NOTE — Progress Notes (Signed)
PROGRESS NOTE    Amanda Garner  CBJ:[id number] DOB: Apr 25, 1971 DOA: 01/09/2020 PCP: Patient, No Pcp Per    Brief Narrative: 49 y.o.femalewith medical history significant ofhistory of diverticulitis with abscess, hypertension, acid reflux admitted with abdominal pain that started 1 1-1/2 weeks ago. Pain is in the lower abdomen and right flank associated with fever nausea loose stools. Her appetite has been decreased with weight loss.  Admitted with nausea and fever of 101.8 and abdominal pain and flank pain. She denied any blood in her stool or urine. She has dysuria and frequency of urination.  CT of the abdomen acute diverticulitis ED Course:She was febrile at 101.8. She was started on IV fluids Rocephin and Flagyl. Surgery has been consulted.  01/10/2020-patient reports her nausea is almost resolved continues to have abdominal pain and right flank pain continues to have frequency of urination with dysuria.  She has not had a bowel movement since admission. T Max of 102.7   Assessment & Plan:   Active Problems:   Acute diverticulitis   Hypokalemia   Acute lower UTI   #1 acute recurrent sigmoid diverticulitis patient admitted with abdominal pain nausea and diarrhea.  She reports feeling better.  Surgery has advance her diet to clear liquids today.  Continue Rocephin and Flagyl.   #2 history of essential hypertension-blood pressure 136/107 restart home medications.  Hydralazine as needed hold p.o. meds.  #3  Probable UTI-patient admitted with fever dysuria and frequency of urination. UA consistent with UTI with large leukocytes and more than 50 WBCs.  Urine culture with mixed growth.   #4 hypokalemia resolved potassium 3.8 magnesium  1.8.    #5 E. coli bacteremia continue Rocephin.  Susceptibilities pending.    Estimated body mass index is 33.57 kg/m as calculated from the following:   Height as of this encounter: 5' 6"  (1.676 m).   Weight as of this  encounter: 94.3 kg.  DVT prophylaxis: Lovenox  code Status: Full code Family Communication: Discussed with patient Disposition Plan:  Status is: Inpatient  Dispo: The patient is from: Home  Anticipated d/c is to: Home  Anticipated d/c date is: Unknown  Patient currently is not medically stable to d/c.  Patient admitted with abdominal pain found to have UTI and diverticulitis is n.p.o. on IV fluids and IV antibiotics.   Consultants:   Surgery  Procedures: None Antimicrobials: Rocephin and Flagyl  Subjective: Patient moving around in the room pain is better compared to yesterday denied any vomiting overnight had few bowel movements loose  Objective: Vitals:   01/10/20 2127 01/11/20 0131 01/11/20 0527 01/11/20 0930  BP: (!) 154/97 136/83 137/90 (!) 136/107  Pulse: (!) 112 (!) 102 93 98  Resp: 19 19 19 16   Temp: 99.7 F (37.6 C) 99.7 F (37.6 C) 98.7 F (37.1 C) 98.9 F (37.2 C)  TempSrc: Oral Oral Oral Oral  SpO2: 98% 97% 100% 99%  Weight:      Height:        Intake/Output Summary (Last 24 hours) at 01/11/2020 1437 Last data filed at 01/11/2020 0100 Gross per 24 hour  Intake 3182.71 ml  Output 2100 ml  Net 1082.71 ml   Filed Weights   01/09/20 1444  Weight: 94.3 kg    Examination:  General exam: Appears calm and comfortable  Respiratory system: Clear to auscultation. Respiratory effort normal. Cardiovascular system: S1 & S2 heard, RRR. No JVD, murmurs, rubs, gallops or clicks. No pedal edema. Gastrointestinal system: Abdomen is nondistended, soft and lower abdominal  tender. No organomegaly or masses felt. Normal bowel sounds heard.  No right flank tenderness Central nervous system: Alert and oriented. No focal neurological deficits. Extremities: Symmetric 5 x 5 power. Skin: No rashes, lesions or ulcers Psychiatry: Judgement and insight appear normal. Mood & affect appropriate.     Data Reviewed: I have personally  reviewed following labs and imaging studies  CBC: Recent Labs  Lab 01/09/20 0914 01/09/20 1846 01/10/20 0345 01/11/20 1019  WBC 10.3 10.6* 9.4 6.3  NEUTROABS 8.1*  --   --   --   HGB 13.1 12.0 11.9* 12.3  HCT 40.2 37.4 36.6 38.5  MCV 68.0* 68.1* 68.4* 68.4*  PLT 223 196 191 086   Basic Metabolic Panel: Recent Labs  Lab 01/09/20 0914 01/09/20 1846 01/10/20 0345 01/11/20 1019  NA 139  --  138 140  K 3.8  --  3.0* 3.8  CL 105  --  106 108  CO2 20*  --  23 20*  GLUCOSE 137*  --  102* 86  BUN 10  --  6 <5*  CREATININE 0.85 0.81 0.75 0.69  CALCIUM 9.4  --  8.4* 8.7*  MG  --   --  1.5* 1.8  PHOS  --   --   --  2.0*   GFR: Estimated Creatinine Clearance: 98.4 mL/min (by C-G formula based on SCr of 0.69 mg/dL). Liver Function Tests: Recent Labs  Lab 01/09/20 0914 01/10/20 0345  AST 19 15  ALT 16 15  ALKPHOS 68 55  BILITOT 0.9 0.7  PROT 7.2 6.7  ALBUMIN 3.8 3.2*   Recent Labs  Lab 01/09/20 0914  LIPASE 20   No results for input(s): AMMONIA in the last 168 hours. Coagulation Profile: No results for input(s): INR, PROTIME in the last 168 hours. Cardiac Enzymes: No results for input(s): CKTOTAL, CKMB, CKMBINDEX, TROPONINI in the last 168 hours. BNP (last 3 results) No results for input(s): PROBNP in the last 8760 hours. HbA1C: No results for input(s): HGBA1C in the last 72 hours. CBG: No results for input(s): GLUCAP in the last 168 hours. Lipid Profile: No results for input(s): CHOL, HDL, LDLCALC, TRIG, CHOLHDL, LDLDIRECT in the last 72 hours. Thyroid Function Tests: No results for input(s): TSH, T4TOTAL, FREET4, T3FREE, THYROIDAB in the last 72 hours. Anemia Panel: No results for input(s): VITAMINB12, FOLATE, FERRITIN, TIBC, IRON, RETICCTPCT in the last 72 hours. Sepsis Labs: Recent Labs  Lab 01/09/20 1509 01/09/20 1846  LATICACIDVEN 1.0 0.9    Recent Results (from the past 240 hour(s))  Blood Culture (routine x 2)     Status: Abnormal (Preliminary  result)   Collection Time: 01/09/20  3:06 PM   Specimen: BLOOD  Result Value Ref Range Status   Specimen Description BLOOD RIGHT ANTECUBITAL  Final   Special Requests   Final    BOTTLES DRAWN AEROBIC AND ANAEROBIC Blood Culture adequate volume   Culture  Setup Time   Final    ANAEROBIC BOTTLE ONLY GRAM NEGATIVE RODS CRITICAL RESULT CALLED TO, READ BACK BY AND VERIFIED WITH: PHARMD Gorden Harms [phone] FCP    Culture (A)  Final    ESCHERICHIA COLI SUSCEPTIBILITIES TO FOLLOW Performed at [facility], [place] [address], [place], [place] [address]    Report Status PENDING  Incomplete  Blood Culture ID Panel (Reflexed)     Status: Abnormal   Collection Time: 01/09/20  3:06 PM  Result Value Ref Range Status   Enterococcus species NOT DETECTED NOT DETECTED Final  Listeria monocytogenes NOT DETECTED NOT DETECTED Final   Staphylococcus species NOT DETECTED NOT DETECTED Final   Staphylococcus aureus (BCID) NOT DETECTED NOT DETECTED Final   Streptococcus species NOT DETECTED NOT DETECTED Final   Streptococcus agalactiae NOT DETECTED NOT DETECTED Final   Streptococcus pneumoniae NOT DETECTED NOT DETECTED Final   Streptococcus pyogenes NOT DETECTED NOT DETECTED Final   Acinetobacter baumannii NOT DETECTED NOT DETECTED Final   Enterobacteriaceae species DETECTED (A) NOT DETECTED Final    Comment: Enterobacteriaceae represent a large family of gram-negative bacteria, not a single organism. CRITICAL RESULT CALLED TO, READ BACK BY AND VERIFIED WITH: PHARMD [place] [phone] FCP    Enterobacter cloacae complex NOT DETECTED NOT DETECTED Final   Escherichia coli DETECTED (A) NOT DETECTED Final    Comment: CRITICAL RESULT CALLED TO, READ BACK BY AND VERIFIED WITH: PHARMD KIM HURTH [phone] FCP    Klebsiella oxytoca NOT DETECTED NOT DETECTED Final   Klebsiella pneumoniae NOT DETECTED NOT DETECTED Final   Proteus species NOT DETECTED NOT DETECTED Final   Serratia marcescens NOT  DETECTED NOT DETECTED Final   Carbapenem resistance NOT DETECTED NOT DETECTED Final   Haemophilus influenzae NOT DETECTED NOT DETECTED Final   Neisseria meningitidis NOT DETECTED NOT DETECTED Final   Pseudomonas aeruginosa NOT DETECTED NOT DETECTED Final   Candida albicans NOT DETECTED NOT DETECTED Final   Candida glabrata NOT DETECTED NOT DETECTED Final   Candida krusei NOT DETECTED NOT DETECTED Final   Candida parapsilosis NOT DETECTED NOT DETECTED Final   Candida tropicalis NOT DETECTED NOT DETECTED Final    Comment: Performed at [facility], [place] [address] [place], [place] [address]  Blood Culture (routine x 2)     Status: None (Preliminary result)   Collection Time: 01/09/20  3:08 PM   Specimen: BLOOD LEFT HAND  Result Value Ref Range Status   Specimen Description BLOOD LEFT HAND  Final   Special Requests   Final    BOTTLES DRAWN AEROBIC AND ANAEROBIC Blood Culture results may not be optimal due to an inadequate volume of blood received in culture bottles   Culture   Final    NO GROWTH 2 DAYS Performed at [facility], [place] [address] [place], [place] [address]    Report Status PENDING  Incomplete  Urine culture     Status: Abnormal   Collection Time: 01/09/20  4:55 PM   Specimen: In/Out Cath Urine  Result Value Ref Range Status   Specimen Description IN/OUT CATH URINE  Final   Special Requests   Final    NONE Performed at [facility], [place] [address], [place], [place] [address]    Culture MULTIPLE SPECIES PRESENT, SUGGEST RECOLLECTION (A)  Final   Report Status 01/10/2020 FINAL  Final  SARS Coronavirus 2 by RT PCR (hospital order, performed in [facility] hospital lab) Nasopharyngeal Nasopharyngeal Swab     Status: None   Collection Time: 01/09/20  8:48 PM   Specimen: Nasopharyngeal Swab  Result Value Ref Range Status   SARS Coronavirus 2 NEGATIVE NEGATIVE Final    Comment: (NOTE) SARS-CoV-2 target nucleic acids are NOT DETECTED. The SARS-CoV-2 RNA  is generally detectable in upper and lower respiratory specimens during the acute phase of infection. The lowest concentration of SARS-CoV-2 viral copies this assay can detect is 250 copies / mL. A negative result does not preclude SARS-CoV-2 infection and should not be used as the sole basis for treatment or other patient  management decisions.  A negative result may occur with improper specimen collection / handling, submission of specimen other than nasopharyngeal swab, presence of viral mutation(s) within the areas targeted by this assay, and inadequate number of viral copies (<250 copies / mL). A negative result must be combined with clinical observations, patient history, and epidemiological information. Fact Sheet for Patients:   StrictlyIdeas.no Fact Sheet for Healthcare Providers: BankingDealers.co.za This test is not yet approved or cleared  by the [place] FDA and has been authorized for detection and/or diagnosis of SARS-CoV-2 by FDA under an Emergency Use Authorization (EUA).  This EUA will remain in effect (meaning this test can be used) for the duration of the COVID-19 declaration under Section 564(b)(1) of the Act, 21 U.S.C. section 360bbb-3(b)(1), unless the authorization is terminated or revoked sooner. Performed at [facility], [place] [address], [place], [place] [address]   MRSA PCR Screening     Status: None   Collection Time: 01/10/20  6:37 PM   Specimen: Nasal Mucosa; Nasopharyngeal  Result Value Ref Range Status   MRSA by PCR NEGATIVE NEGATIVE Final    Comment:        The GeneXpert MRSA Assay (FDA approved for NASAL specimens only), is one component of a comprehensive MRSA colonization surveillance program. It is not intended to diagnose MRSA infection nor to guide or monitor treatment for MRSA infections. Performed at [facility], [place] [address], [place], [place] [address]           Radiology Studies: CT Renal Stone Study  Result Date: 01/09/2020 CLINICAL DATA:  Left lower quadrant pain x1 week. EXAM: CT ABDOMEN AND PELVIS WITHOUT CONTRAST TECHNIQUE: Multidetector CT imaging of the abdomen and pelvis was performed following the standard protocol without IV contrast. COMPARISON:  May 30, 2017 FINDINGS: Lower chest: No acute abnormality. Hepatobiliary: No focal liver abnormality is seen. No gallstones, gallbladder wall thickening, or biliary dilatation. Pancreas: Unremarkable. No pancreatic ductal dilatation or surrounding inflammatory changes. Spleen: Normal in size without focal abnormality. Adrenals/Urinary Tract: Adrenal glands are unremarkable. Kidneys are normal, without renal calculi or focal lesions. Mild the prominent extrarenal pelvis are seen bilaterally, left greater than right. Bladder is unremarkable. Stomach/Bowel: There is a small hiatal hernia. Appendix appears normal. Mild to moderately inflamed diverticula are seen within the mid to distal sigmoid colon. 1.5 cm and 1.2 cm foci of air attenuation are also seen adjacent to this portion of the sigmoid colon (axial CT image 68, CT series number 2). Vascular/Lymphatic: No significant vascular findings are present. No enlarged abdominal or pelvic lymph nodes. Reproductive: Uterus and bilateral adnexa are unremarkable. Other: No abdominal wall hernia or abnormality. No abdominopelvic ascites. Musculoskeletal: No acute or significant osseous findings. IMPRESSION: 1. Mild to moderately inflamed diverticula within the mid to distal sigmoid colon, consistent with acute diverticulitis. While the adjacent foci of air attenuation may represent enlarged, inflamed diverticula, small focal areas of contained free air cannot be excluded. 2. Small hiatal hernia. Electronically Signed   By: Virgina Norfolk M.D.   On: 01/09/2020 16:09        Scheduled Meds: . heparin  5,000 Units Subcutaneous Q8H  . saccharomyces  boulardii  250 mg Oral BID  . scopolamine  1 patch Transdermal Q72H   Continuous Infusions: . sodium chloride 125 mL/hr at 01/11/20 1232  . cefTRIAXone (ROCEPHIN)  IV 2 g (01/11/20 0814)  . methocarbamol (ROBAXIN) IV 1,000 mg (01/11/20 1333)  . metronidazole 500 mg (01/11/20 1007)  LOS: 2 days      Georgette Shell, MD  01/11/2020, 2:37 PM

## 2020-01-11 NOTE — Plan of Care (Signed)
  Problem: Activity: Goal: Risk for activity intolerance will decrease Outcome: Progressing   Problem: Coping: Goal: Level of anxiety will decrease Outcome: Progressing   Problem: Elimination: Goal: Will not experience complications related to bowel motility Outcome: Progressing Goal: Will not experience complications related to urinary retention Outcome: Progressing   Problem: Pain Managment: Goal: General experience of comfort will improve Outcome: Progressing   Problem: Safety: Goal: Ability to remain free from injury will improve Outcome: Progressing   

## 2020-01-11 NOTE — Plan of Care (Signed)
  Problem: Nutrition: Goal: Adequate nutrition will be maintained Outcome: Progressing   Problem: Pain Managment: Goal: General experience of comfort will improve Outcome: Progressing   

## 2020-01-11 NOTE — Progress Notes (Signed)
[facility] Progress Note     Subjective: CC-  Feeling much better today. States that her abdominal pain has completely resolved. Denies n/v. Passing flatus, had 3 BMs yesterday. Fevers resolving. Labs pending this morning. She also reports having a headache, tylenol not helping.  Objective: Vital signs in last 24 hours: Temp:  [98.7 F (37.1 C)-103.1 F (39.5 C)] 98.7 F (37.1 C) (05/21 0527) Pulse Rate:  [93-117] 93 (05/21 0527) Resp:  [15-19] 19 (05/21 0527) BP: (136-170)/(80-97) 137/90 (05/21 0527) SpO2:  [90 %-100 %] 100 % (05/21 0527) Last BM Date: 01/10/20  Intake/Output from previous day: 05/20 0701 - 05/21 0700 In: 3182.7 [I.V.:1447.2; IV Piggyback:1735.6] Out: 2800 [Urine:2800] Intake/Output this shift: No intake/output data recorded.  PE: Gen:  Alert, NAD, pleasant HEENT: EOM's intact, pupils equal and round Card:  RRR Pulm:  CTAB, no W/R/R, rate and effort normal Abd: Soft, NT/ND, +BS, no HSM Skin: no rashes noted, warm and dry  Lab Results:  Recent Labs    01/09/20 1846 01/10/20 0345  WBC 10.6* 9.4  HGB 12.0 11.9*  HCT 37.4 36.6  PLT 196 191   BMET Recent Labs    01/09/20 0914 01/09/20 0914 01/09/20 1846 01/10/20 0345  NA 139  --   --  138  K 3.8  --   --  3.0*  CL 105  --   --  106  CO2 20*  --   --  23  GLUCOSE 137*  --   --  102*  BUN 10  --   --  6  CREATININE 0.85   < > 0.81 0.75  CALCIUM 9.4  --   --  8.4*   < > = values in this interval not displayed.   PT/INR No results for input(s): LABPROT, INR in the last 72 hours. CMP     Component Value Date/Time   NA 138 01/10/2020 0345   K 3.0 (L) 01/10/2020 0345   CL 106 01/10/2020 0345   CO2 23 01/10/2020 0345   GLUCOSE 102 (H) 01/10/2020 0345   BUN 6 01/10/2020 0345   CREATININE 0.75 01/10/2020 0345   CALCIUM 8.4 (L) 01/10/2020 0345   PROT 6.7 01/10/2020 0345   ALBUMIN 3.2 (L) 01/10/2020 0345   AST 15 01/10/2020 0345   ALT 15 01/10/2020 0345   ALKPHOS 55  01/10/2020 0345   BILITOT 0.7 01/10/2020 0345   GFRNONAA >60 01/10/2020 0345   GFRAA >60 01/10/2020 0345   Lipase     Component Value Date/Time   LIPASE 20 01/09/2020 0914       Studies/Results: CT Renal Stone Study  Result Date: 01/09/2020 CLINICAL DATA:  Left lower quadrant pain x1 week. EXAM: CT ABDOMEN AND PELVIS WITHOUT CONTRAST TECHNIQUE: Multidetector CT imaging of the abdomen and pelvis was performed following the standard protocol without IV contrast. COMPARISON:  May 30, 2017 FINDINGS: Lower chest: No acute abnormality. Hepatobiliary: No focal liver abnormality is seen. No gallstones, gallbladder wall thickening, or biliary dilatation. Pancreas: Unremarkable. No pancreatic ductal dilatation or surrounding inflammatory changes. Spleen: Normal in size without focal abnormality. Adrenals/Urinary Tract: Adrenal glands are unremarkable. Kidneys are normal, without renal calculi or focal lesions. Mild the prominent extrarenal pelvis are seen bilaterally, left greater than right. Bladder is unremarkable. Stomach/Bowel: There is a small hiatal hernia. Appendix appears normal. Mild to moderately inflamed diverticula are seen within the mid to distal sigmoid colon. 1.5 cm and 1.2 cm foci of air attenuation are also seen adjacent to this  portion of the sigmoid colon (axial CT image 68, CT series number 2). Vascular/Lymphatic: No significant vascular findings are present. No enlarged abdominal or pelvic lymph nodes. Reproductive: Uterus and bilateral adnexa are unremarkable. Other: No abdominal wall hernia or abnormality. No abdominopelvic ascites. Musculoskeletal: No acute or significant osseous findings. IMPRESSION: 1. Mild to moderately inflamed diverticula within the mid to distal sigmoid colon, consistent with acute diverticulitis. While the adjacent foci of air attenuation may represent enlarged, inflamed diverticula, small focal areas of contained free air cannot be excluded. 2. Small hiatal  hernia. Electronically Signed   By: Virgina Norfolk M.D.   On: 01/09/2020 16:09    Anti-infectives: Anti-infectives (From admission, onward)   Start     Dose/Rate Route Frequency Ordered Stop   01/10/20 0800  cefTRIAXone (ROCEPHIN) 2 g in sodium chloride 0.9 % 100 mL IVPB     2 g 200 mL/hr over 30 Minutes Intravenous Every 24 hours 01/09/20 1804     01/10/20 0200  metroNIDAZOLE (FLAGYL) IVPB 500 mg     500 mg 100 mL/hr over 60 Minutes Intravenous Every 8 hours 01/09/20 1804     01/09/20 1645  ceFEPIme (MAXIPIME) 2 g in sodium chloride 0.9 % 100 mL IVPB  Status:  Discontinued     2 g 200 mL/hr over 30 Minutes Intravenous  Once 01/09/20 1643 01/09/20 1659   01/09/20 1645  metroNIDAZOLE (FLAGYL) IVPB 500 mg     500 mg 100 mL/hr over 60 Minutes Intravenous  Once 01/09/20 1643 01/09/20 1802   01/09/20 1500  cefTRIAXone (ROCEPHIN) 1 g in sodium chloride 0.9 % 100 mL IVPB  Status:  Discontinued     1 g 200 mL/hr over 30 Minutes Intravenous Every 24 hours 01/09/20 1447 01/10/20 0058       Assessment/Plan Hypertension   Recent dental infection - no follow up with dentist GERD Hx tobacco use/marijuana use Hx C. difficile colitis 08/2011 Hx sickle cell trait Hx Crohn's disease - I cannot find documentation or hx to support this. -  Care Everywhere sigmoid diverticulitis with possible colovesicular fistula, possible colovaginal fistula     11/24/15 Dr. [place]mar [place] >> she does have a UTI, denies vaginal drainage  Recurrent sigmoid diverticulitis with possible microperforation. - 3rd bout, never had a colonoscopy - CT wo contrast 5/19 mild to moderately inflamed diverticula within the mid to distal sigmoid colon, consistent with acute diverticulitis; adjacent foci of air attenuation may represent enlarged, inflamed diverticula, small focal areas of contained free air cannot be excluded Enterobacter/E coli bacteremia  Probable UTI - Ucx with multiple species  present  FEN: IVF, CLD ID: Rocephin/Flagyl 5/19; Ceftriaxone/Flagyl 5/20 >> DVT: sq Heparin Follow up:  TBD  Plan: Labs pending. Clinically much better.  Advance to clear liquids. Continue IV antibiotics. Add probiotic. Mobilize. IV toradol for headache. Ultimately will need colonoscopy in about 6-8 weeks if she continues to improve with conservative management; she is concerned about the cost so I have asked TOC team to consult.   LOS: 2 days    Wellington Hampshire, [facility] 01/11/2020, 8:57 AM Please see Amion for pager number during day hours 7:00am-4:30pm

## 2020-01-12 LAB — CULTURE, BLOOD (ROUTINE X 2): Special Requests: ADEQUATE

## 2020-01-12 MED ORDER — CIPROFLOXACIN HCL 500 MG PO TABS
500.0000 mg | ORAL_TABLET | Freq: Two times a day (BID) | ORAL | Status: DC
  Administered 2020-01-12 – 2020-01-13 (×3): 500 mg via ORAL
  Filled 2020-01-12 (×3): qty 1

## 2020-01-12 MED ORDER — METRONIDAZOLE 500 MG PO TABS
500.0000 mg | ORAL_TABLET | Freq: Three times a day (TID) | ORAL | Status: DC
  Administered 2020-01-12 – 2020-01-13 (×3): 500 mg via ORAL
  Filled 2020-01-12 (×3): qty 1

## 2020-01-12 MED ORDER — AMLODIPINE BESYLATE 5 MG PO TABS
5.0000 mg | ORAL_TABLET | Freq: Every day | ORAL | Status: DC
  Administered 2020-01-12 – 2020-01-13 (×2): 5 mg via ORAL
  Filled 2020-01-12 (×2): qty 1

## 2020-01-12 NOTE — Plan of Care (Signed)
  Problem: Clinical Measurements: Goal: Ability to maintain clinical measurements within normal limits will improve Outcome: Progressing   Problem: Nutrition: Goal: Adequate nutrition will be maintained Outcome: Progressing   Problem: Pain Managment: Goal: General experience of comfort will improve Outcome: Progressing

## 2020-01-12 NOTE — Progress Notes (Signed)
   Subjective/Chief Complaint: No complaints   Objective: Vital signs in last 24 hours: Temp:  [98.5 F (36.9 C)-98.9 F (37.2 C)] 98.8 F (37.1 C) (05/22 0552) Pulse Rate:  [78-98] 78 (05/22 0552) Resp:  [16-18] 18 (05/22 0552) BP: (136-157)/(85-107) 157/94 (05/22 0552) SpO2:  [98 %-100 %] 99 % (05/22 0552) Last BM Date: 01/11/20  Intake/Output from previous day: 05/21 0701 - 05/22 0700 In: 1190 [P.O.:1190] Out: -  Intake/Output this shift: No intake/output data recorded.  General appearance: alert and cooperative Resp: clear to auscultation bilaterally Cardio: regular rate and rhythm GI: soft, non-tender; bowel sounds normal; no masses,  no organomegaly  Lab Results:  Recent Labs    01/10/20 0345 01/11/20 1019  WBC 9.4 6.3  HGB 11.9* 12.3  HCT 36.6 38.5  PLT 191 203   BMET Recent Labs    01/10/20 0345 01/11/20 1019  NA 138 140  K 3.0* 3.8  CL 106 108  CO2 23 20*  GLUCOSE 102* 86  BUN 6 <5*  CREATININE 0.75 0.69  CALCIUM 8.4* 8.7*   PT/INR No results for input(s): LABPROT, INR in the last 72 hours. ABG No results for input(s): PHART, HCO3 in the last 72 hours.  Invalid input(s): PCO2, PO2  Studies/Results: No results found.  Anti-infectives: Anti-infectives (From admission, onward)   Start     Dose/Rate Route Frequency Ordered Stop   01/10/20 0800  cefTRIAXone (ROCEPHIN) 2 g in sodium chloride 0.9 % 100 mL IVPB     2 g 200 mL/hr over 30 Minutes Intravenous Every 24 hours 01/09/20 1804     01/10/20 0200  metroNIDAZOLE (FLAGYL) IVPB 500 mg     500 mg 100 mL/hr over 60 Minutes Intravenous Every 8 hours 01/09/20 1804     01/09/20 1645  ceFEPIme (MAXIPIME) 2 g in sodium chloride 0.9 % 100 mL IVPB  Status:  Discontinued     2 g 200 mL/hr over 30 Minutes Intravenous  Once 01/09/20 1643 01/09/20 1659   01/09/20 1645  metroNIDAZOLE (FLAGYL) IVPB 500 mg     500 mg 100 mL/hr over 60 Minutes Intravenous  Once 01/09/20 1643 01/09/20 1802   01/09/20  1500  cefTRIAXone (ROCEPHIN) 1 g in sodium chloride 0.9 % 100 mL IVPB  Status:  Discontinued     1 g 200 mL/hr over 30 Minutes Intravenous Every 24 hours 01/09/20 1447 01/10/20 0058      Assessment/Plan: s/p * No surgery found * Advance diet  Wbc normal. Could consider switching to oral abx soon Ambulate Will follow Sigmoid diverticulitis resolving  LOS: 3 days    Autumn Messing III 01/12/2020

## 2020-01-12 NOTE — Plan of Care (Signed)

## 2020-01-12 NOTE — Progress Notes (Signed)
PROGRESS NOTE    Amanda Garner  STM:[id number] DOB: 09-04-1970 DOA: 01/09/2020 PCP: Patient, No Pcp Per    Brief Narrative:48 y.o.femalewith medical history significant ofhistory of diverticulitis with abscess, hypertension, acid reflux admitted with abdominal pain that started 1 1-1/2 weeks ago. Pain is in the lower abdomen and right flank associated with fever nausea loose stools. Her appetite has been decreased with weight loss.  Admitted with nausea and fever of 101.8 and abdominal pain and flank pain. She denied any blood in her stool or urine. She has dysuria and frequency of urination.  CT of the abdomen acute diverticulitis ED Course:She was febrile at 101.8. She was started on IV fluids Rocephin and Flagyl. Surgery has been consulted.  01/10/2020-patient reports her nausea is almost resolved continues to have abdominal pain and right flank pain continues to have frequency of urination with dysuria. She has not had a bowel movement since admission. TMax of 102.7  Assessment & Plan:   Active Problems:   Acute diverticulitis   Hypokalemia   Acute lower UTI  #1 acute recurrent sigmoid diverticulitis-patient tolerating clear liquid diet yesterday surgery advance her to full liquid diet today.  Will change antibiotics to p.o Flagyl and Cipro.  Staff reports that patient adamant about going home today.    #2 history of essential hypertension-blood pressure elevated restart Norvasc 5 mg daily.  As needed hydralazine.    #3  Probable UTI-patient admitted with fever dysuria and frequency of urination. UA consistent with UTI with large leukocytes and more than 50 WBCs.  Urine culture with mixed growth.   #4 hypokalemia resolved potassium 3.8 magnesium  1.8.    #5 E. coli bacteremia continue Rocephin.  Susceptibilities pending.  Estimated body mass index is 33.57 kg/m as calculated from the following:   Height as of this encounter: 5' 6"  (1.676 m).   Weight as  of this encounter: 94.3 kg.  DVT prophylaxis:Lovenox  code Status:Full code Family Communication:Discussed with patient Disposition Plan:Status is: Inpatient  Dispo: The patient is from:Home Anticipated d/c is GX:QJJH Anticipated d/c date is: 5/23 Patient currentlyis not medically stable to d/c.Patient admitted with abdominal pain found to have UTI and diverticulitis is n.p.o. on IV fluids and IV antibiotics.  Consultants:  Surgery  Procedures:None Antimicrobials:Rocephin and Flagyl  Subjective:  Patient reports that she is feeling better and anxious to go home she is requesting something to eat denies any abdominal pain nausea or vomiting Objective: Vitals:   01/11/20 0930 01/11/20 1453 01/11/20 2120 01/12/20 0552  BP: (!) 136/107 (!) 156/103 (!) 147/85 (!) 157/94  Pulse: 98 91 78 78  Resp: 16 17 17 18   Temp: 98.9 F (37.2 C) 98.5 F (36.9 C) 98.5 F (36.9 C) 98.8 F (37.1 C)  TempSrc: Oral Oral Oral Oral  SpO2: 99% 100% 98% 99%  Weight:      Height:        Intake/Output Summary (Last 24 hours) at 01/12/2020 1157 Last data filed at 01/12/2020 0552 Gross per 24 hour  Intake 940 ml  Output --  Net 940 ml   Filed Weights   01/09/20 1444  Weight: 94.3 kg    Examination:  General exam: Appears calm and comfortable  Respiratory system: Clear to auscultation. Respiratory effort normal. Cardiovascular system: S1 & S2 heard, RRR. No JVD, murmurs, rubs, gallops or clicks. No pedal edema. Gastrointestinal system: Abdomen is nondistended, soft and nontender. No organomegaly or masses felt. Normal bowel sounds heard. Central nervous system: Alert and oriented. No  focal neurological deficits. Extremities: Symmetric 5 x 5 power. Skin: No rashes, lesions or ulcers Psychiatry: Judgement and insight appear normal. Mood & affect appropriate.     Data Reviewed: I have personally reviewed following labs and imaging  studies  CBC: Recent Labs  Lab 01/09/20 0914 01/09/20 1846 01/10/20 0345 01/11/20 1019  WBC 10.3 10.6* 9.4 6.3  NEUTROABS 8.1*  --   --   --   HGB 13.1 12.0 11.9* 12.3  HCT 40.2 37.4 36.6 38.5  MCV 68.0* 68.1* 68.4* 68.4*  PLT 223 196 191 030   Basic Metabolic Panel: Recent Labs  Lab 01/09/20 0914 01/09/20 1846 01/10/20 0345 01/11/20 1019  NA 139  --  138 140  K 3.8  --  3.0* 3.8  CL 105  --  106 108  CO2 20*  --  23 20*  GLUCOSE 137*  --  102* 86  BUN 10  --  6 <5*  CREATININE 0.85 0.81 0.75 0.69  CALCIUM 9.4  --  8.4* 8.7*  MG  --   --  1.5* 1.8  PHOS  --   --   --  2.0*   GFR: Estimated Creatinine Clearance: 98.4 mL/min (by C-G formula based on SCr of 0.69 mg/dL). Liver Function Tests: Recent Labs  Lab 01/09/20 0914 01/10/20 0345  AST 19 15  ALT 16 15  ALKPHOS 68 55  BILITOT 0.9 0.7  PROT 7.2 6.7  ALBUMIN 3.8 3.2*   Recent Labs  Lab 01/09/20 0914  LIPASE 20   No results for input(s): AMMONIA in the last 168 hours. Coagulation Profile: No results for input(s): INR, PROTIME in the last 168 hours. Cardiac Enzymes: No results for input(s): CKTOTAL, CKMB, CKMBINDEX, TROPONINI in the last 168 hours. BNP (last 3 results) No results for input(s): PROBNP in the last 8760 hours. HbA1C: No results for input(s): HGBA1C in the last 72 hours. CBG: No results for input(s): GLUCAP in the last 168 hours. Lipid Profile: No results for input(s): CHOL, HDL, LDLCALC, TRIG, CHOLHDL, LDLDIRECT in the last 72 hours. Thyroid Function Tests: No results for input(s): TSH, T4TOTAL, FREET4, T3FREE, THYROIDAB in the last 72 hours. Anemia Panel: No results for input(s): VITAMINB12, FOLATE, FERRITIN, TIBC, IRON, RETICCTPCT in the last 72 hours. Sepsis Labs: Recent Labs  Lab 01/09/20 1509 01/09/20 1846  LATICACIDVEN 1.0 0.9    Recent Results (from the past 240 hour(s))  Blood Culture (routine x 2)     Status: Abnormal   Collection Time: 01/09/20  3:06 PM   Specimen:  BLOOD  Result Value Ref Range Status   Specimen Description BLOOD RIGHT ANTECUBITAL  Final   Special Requests   Final    BOTTLES DRAWN AEROBIC AND ANAEROBIC Blood Culture adequate volume   Culture  Setup Time   Final    ANAEROBIC BOTTLE ONLY GRAM NEGATIVE RODS CRITICAL RESULT CALLED TO, READ BACK BY AND VERIFIED WITH: Thor Baraboo [id number] [place] Performed at [facility], [place] [address] [place], [place] [address]    Culture ESCHERICHIA COLI (A)  Final   Report Status 01/12/2020 FINAL  Final   Organism ID, Bacteria ESCHERICHIA COLI  Final      Susceptibility   Escherichia coli - MIC*    AMPICILLIN <=2 SENSITIVE Sensitive     CEFAZOLIN <=4 SENSITIVE Sensitive     CEFEPIME <=1 SENSITIVE Sensitive     CEFTAZIDIME <=1 SENSITIVE Sensitive     CEFTRIAXONE <=1 SENSITIVE Sensitive     CIPROFLOXACIN <=  0.25 SENSITIVE Sensitive     GENTAMICIN <=1 SENSITIVE Sensitive     IMIPENEM <=0.25 SENSITIVE Sensitive     TRIMETH/SULFA <=20 SENSITIVE Sensitive     AMPICILLIN/SULBACTAM <=2 SENSITIVE Sensitive     PIP/TAZO <=4 SENSITIVE Sensitive     * ESCHERICHIA COLI  Blood Culture ID Panel (Reflexed)     Status: Abnormal   Collection Time: 01/09/20  3:06 PM  Result Value Ref Range Status   Enterococcus species NOT DETECTED NOT DETECTED Final   Listeria monocytogenes NOT DETECTED NOT DETECTED Final   Staphylococcus species NOT DETECTED NOT DETECTED Final   Staphylococcus aureus (BCID) NOT DETECTED NOT DETECTED Final   Streptococcus species NOT DETECTED NOT DETECTED Final   Streptococcus agalactiae NOT DETECTED NOT DETECTED Final   Streptococcus pneumoniae NOT DETECTED NOT DETECTED Final   Streptococcus pyogenes NOT DETECTED NOT DETECTED Final   Acinetobacter baumannii NOT DETECTED NOT DETECTED Final   Enterobacteriaceae species DETECTED (A) NOT DETECTED Final    Comment: Enterobacteriaceae represent a large family of gram-negative bacteria, not a single organism. CRITICAL RESULT CALLED  TO, READ BACK BY AND VERIFIED WITH: PHARMD Radom [id number] [facility]    Enterobacter cloacae complex NOT DETECTED NOT DETECTED Final   Escherichia coli DETECTED (A) NOT DETECTED Final    Comment: CRITICAL RESULT CALLED TO, READ BACK BY AND VERIFIED WITH: PHARMD KIM HURTH [id number] [facility]    Klebsiella oxytoca NOT DETECTED NOT DETECTED Final   Klebsiella pneumoniae NOT DETECTED NOT DETECTED Final   Proteus species NOT DETECTED NOT DETECTED Final   Serratia marcescens NOT DETECTED NOT DETECTED Final   Carbapenem resistance NOT DETECTED NOT DETECTED Final   Haemophilus influenzae NOT DETECTED NOT DETECTED Final   Neisseria meningitidis NOT DETECTED NOT DETECTED Final   Pseudomonas aeruginosa NOT DETECTED NOT DETECTED Final   Candida albicans NOT DETECTED NOT DETECTED Final   Candida glabrata NOT DETECTED NOT DETECTED Final   Candida krusei NOT DETECTED NOT DETECTED Final   Candida parapsilosis NOT DETECTED NOT DETECTED Final   Candida tropicalis NOT DETECTED NOT DETECTED Final    Comment: Performed at [facility], [place] [address], [place], [place] [address]  Blood Culture (routine x 2)     Status: None (Preliminary result)   Collection Time: 01/09/20  3:08 PM   Specimen: BLOOD LEFT HAND  Result Value Ref Range Status   Specimen Description BLOOD LEFT HAND  Final   Special Requests   Final    BOTTLES DRAWN AEROBIC AND ANAEROBIC Blood Culture results may not be optimal due to an inadequate volume of blood received in culture bottles   Culture   Final    NO GROWTH 3 DAYS Performed at [facility], [place] [address], [place], [place] [address]    Report Status PENDING  Incomplete  Urine culture     Status: Abnormal   Collection Time: 01/09/20  4:55 PM   Specimen: In/Out Cath Urine  Result Value Ref Range Status   Specimen Description IN/OUT CATH URINE  Final   Special Requests   Final    NONE Performed at [facility], [place] [address], [employer] [place], [place] [address]     Culture MULTIPLE SPECIES PRESENT, SUGGEST RECOLLECTION (A)  Final   Report Status 01/10/2020 FINAL  Final  SARS Coronavirus 2 by RT PCR (hospital order, performed in [facility] hospital lab) Nasopharyngeal Nasopharyngeal Swab     Status: None   Collection Time: 01/09/20  8:48 PM  Specimen: Nasopharyngeal Swab  Result Value Ref Range Status   SARS Coronavirus 2 NEGATIVE NEGATIVE Final    Comment: (NOTE) SARS-CoV-2 target nucleic acids are NOT DETECTED. The SARS-CoV-2 RNA is generally detectable in upper and lower respiratory specimens during the acute phase of infection. The lowest concentration of SARS-CoV-2 viral copies this assay can detect is 250 copies / mL. A negative result does not preclude SARS-CoV-2 infection and should not be used as the sole basis for treatment or other patient management decisions.  A negative result may occur with improper specimen collection / handling, submission of specimen other than nasopharyngeal swab, presence of viral mutation(s) within the areas targeted by this assay, and inadequate number of viral copies (<250 copies / mL). A negative result must be combined with clinical observations, patient history, and epidemiological information. Fact Sheet for Patients:   StrictlyIdeas.no Fact Sheet for Healthcare Providers: BankingDealers.co.za This test is not yet approved or cleared  by the [place] FDA and has been authorized for detection and/or diagnosis of SARS-CoV-2 by FDA under an Emergency Use Authorization (EUA).  This EUA will remain in effect (meaning this test can be used) for the duration of the COVID-19 declaration under Section 564(b)(1) of the Act, 21 U.S.C. section 360bbb-3(b)(1), unless the authorization is terminated or revoked sooner. Performed at [facility], [place] [address], [place], [place] [address]   MRSA PCR Screening     Status: None   Collection Time: 01/10/20   6:37 PM   Specimen: Nasal Mucosa; Nasopharyngeal  Result Value Ref Range Status   MRSA by PCR NEGATIVE NEGATIVE Final    Comment:        The GeneXpert MRSA Assay (FDA approved for NASAL specimens only), is one component of a comprehensive MRSA colonization surveillance program. It is not intended to diagnose MRSA infection nor to guide or monitor treatment for MRSA infections. Performed at [facility], [place] [address] [place], [place] [address]          Radiology Studies: No results found.      Scheduled Meds: . ciprofloxacin  500 mg Oral BID  . heparin  5,000 Units Subcutaneous Q8H  . metroNIDAZOLE  500 mg Oral Q8H  . saccharomyces boulardii  250 mg Oral BID  . scopolamine  1 patch Transdermal Q72H   Continuous Infusions: . sodium chloride 10 mL/hr at 01/12/20 0853     LOS: 3 days    Georgette Shell, MD  01/12/2020, 11:57 AM

## 2020-01-13 ENCOUNTER — Encounter (HOSPITAL_COMMUNITY): Payer: Self-pay | Admitting: Surgery

## 2020-01-13 MED ORDER — METRONIDAZOLE 500 MG PO TABS: 500.0000 mg | ORAL_TABLET | Freq: Three times a day (TID) | ORAL | 0 refills | Status: DC

## 2020-01-13 MED ORDER — AMLODIPINE BESYLATE 5 MG PO TABS
5.0000 mg | ORAL_TABLET | Freq: Every day | ORAL | 11 refills | Status: DC
Start: 2020-01-13 — End: 2020-12-11

## 2020-01-13 MED ORDER — METRONIDAZOLE 500 MG PO TABS: 500.0000 mg | ORAL_TABLET | Freq: Three times a day (TID) | ORAL | 0 refills | Status: AC

## 2020-01-13 MED ORDER — CIPROFLOXACIN HCL 500 MG PO TABS: 500.0000 mg | ORAL_TABLET | Freq: Two times a day (BID) | ORAL | 0 refills | Status: DC

## 2020-01-13 MED ORDER — CIPROFLOXACIN HCL 500 MG PO TABS: 500.0000 mg | ORAL_TABLET | Freq: Two times a day (BID) | ORAL | 0 refills | Status: AC

## 2020-01-13 NOTE — Plan of Care (Signed)

## 2020-01-13 NOTE — Discharge Summary (Signed)
Physician Discharge Summary  Amanda Garner GNF:[id number] DOB: [birth year] DOA: 01/09/2020  PCP: Patient, No Pcp Per  Admit date: 01/09/2020 Discharge date: 01/13/2020  Admitted From: Home disposition: Home Recommendations for Outpatient Follow-up:  1. Follow up with PCP in 1-2 weeks 2. Please obtain BMP/CBC in one week 3. Please follow up with general surgery in 3 weeks  Home Health: None Equipment/Devices: None Discharge Condition stable and improved CODE STATUS full code Diet recommendation: Cardiac diet Brief/Interim Summary:49 y.o.femalewith medical history significant ofhistory of diverticulitis with abscess, hypertension, acid reflux admitted with abdominal pain that started 1 1-1/2 weeks ago. Pain is in the lower abdomen and right flank associated with fever nausea loose stools  Admitted with nausea and fever of 101.8 and abdominal pain and flank pain. She denied any blood in her stool or urine. She has dysuria and frequency of urination.  CT of the abdomen acute diverticulitis ED Course:She was febrile at 101.8. She was started on IV fluids Rocephin and Flagyl. Surgery has been consulted  Discharge Diagnoses:  Active Problems:   Acute diverticulitis   Hypokalemia   Acute lower UTI  #1 acuterecurrent sigmoiddiverticulitis-patient tolerating regular diet.  Will discharge patient on Cipro and Flagyl for another 10 days.  She will follow up with general surgery in 3 weeks..    #2 history of essential hypertension-continue Norvasc   #3ProbableUTI-patient admitted with fever dysuria and frequency of urination. UA consistent with UTI with large leukocytes and more than 50 WBCs.Urine culture with mixed growth.   #4 hypokalemiaresolved potassium 3.8 magnesium 1.8.   #5 E. coli bacteremia sensitive to Cipro.    Estimated body mass index is 33.57 kg/m as calculated from the following:   Height as of this encounter: 5' 6"  (1.676 m).   Weight as of this  encounter: 94.3 kg.  Discharge Instructions  Discharge Instructions    Diet - low sodium heart healthy   Complete by: As directed    Increase activity slowly   Complete by: As directed      Allergies as of 01/13/2020   No Known Allergies     Medication List    STOP taking these medications   albuterol 108 (90 Base) MCG/ACT inhaler Commonly known as: VENTOLIN HFA   cimetidine 200 MG tablet Commonly known as: TAGAMET   clindamycin 300 MG capsule Commonly known as: CLEOCIN   fluticasone 50 MCG/ACT nasal spray Commonly known as: FLONASE   HYDROcodone-acetaminophen 7.5-325 MG tablet Commonly known as: Norco   ibuprofen 800 MG tablet Commonly known as: ADVIL   nicotine 14 mg/24hr patch Commonly known as: NICODERM CQ - dosed in mg/24 hours   pantoprazole 40 MG tablet Commonly known as: PROTONIX     TAKE these medications   amLODipine 5 MG tablet Commonly known as: NORVASC Take 1 tablet (5 mg total) by mouth daily.   ciprofloxacin 500 MG tablet Commonly known as: CIPRO Take 1 tablet (500 mg total) by mouth 2 (two) times daily.   metroNIDAZOLE 500 MG tablet Commonly known as: FLAGYL Take 1 tablet (500 mg total) by mouth every 8 (eight) hours.      Follow-up Information    [facility]. Go to.   Why: January 31, 2020 at 0900 am  Contact information: [place] [id number] [phone]       Jovita Kussmaul, MD Follow up.   Specialty: General Surgery Contact information: [place] [address] [phone]  No Known Allergies  Consultations:  General surgery   Procedures/Studies: CT Renal Stone Study  Result Date: 01/09/2020 CLINICAL DATA:  Left lower quadrant pain x1 week. EXAM: CT ABDOMEN AND PELVIS WITHOUT CONTRAST TECHNIQUE: Multidetector CT imaging of the abdomen and pelvis was performed following the standard protocol without IV contrast.  COMPARISON:  May 30, 2017 FINDINGS: Lower chest: No acute abnormality. Hepatobiliary: No focal liver abnormality is seen. No gallstones, gallbladder wall thickening, or biliary dilatation. Pancreas: Unremarkable. No pancreatic ductal dilatation or surrounding inflammatory changes. Spleen: Normal in size without focal abnormality. Adrenals/Urinary Tract: Adrenal glands are unremarkable. Kidneys are normal, without renal calculi or focal lesions. Mild the prominent extrarenal pelvis are seen bilaterally, left greater than right. Bladder is unremarkable. Stomach/Bowel: There is a small hiatal hernia. Appendix appears normal. Mild to moderately inflamed diverticula are seen within the mid to distal sigmoid colon. 1.5 cm and 1.2 cm foci of air attenuation are also seen adjacent to this portion of the sigmoid colon (axial CT image 68, CT series number 2). Vascular/Lymphatic: No significant vascular findings are present. No enlarged abdominal or pelvic lymph nodes. Reproductive: Uterus and bilateral adnexa are unremarkable. Other: No abdominal wall hernia or abnormality. No abdominopelvic ascites. Musculoskeletal: No acute or significant osseous findings. IMPRESSION: 1. Mild to moderately inflamed diverticula within the mid to distal sigmoid colon, consistent with acute diverticulitis. While the adjacent foci of air attenuation may represent enlarged, inflamed diverticula, small focal areas of contained free air cannot be excluded. 2. Small hiatal hernia. Electronically Signed   By: Virgina Norfolk M.D.   On: 01/09/2020 16:09    (Echo, Carotid, EGD, Colonoscopy, ERCP)    Subjective: Patient resting in bed anxious to go home no new complaints no nausea vomiting or abdominal pain tolerating regular diet  Discharge Exam: Vitals:   01/12/20 2351 01/13/20 0453  BP: 140/78 (!) 150/92  Pulse: (!) 102 89  Resp:  20  Temp: 98.9 F (37.2 C) 97.8 F (36.6 C)  SpO2: 95% 99%   Vitals:   01/12/20 2013 01/12/20  2100 01/12/20 2351 01/13/20 0453  BP: (!) 150/100 (!) 169/99 140/78 (!) 150/92  Pulse: 80  (!) 102 89  Resp:    20  Temp: 98.7 F (37.1 C)  98.9 F (37.2 C) 97.8 F (36.6 C)  TempSrc: Oral  Oral Oral  SpO2: 99%  95% 99%  Weight:      Height:        General: Pt is alert, awake, not in acute distress Cardiovascular: RRR, S1/S2 +, no rubs, no gallops Respiratory: CTA bilaterally, no wheezing, no rhonchi Abdominal: Soft, NT, ND, bowel sounds + Extremities: no edema, no cyanosis    The results of significant diagnostics from this hospitalization (including imaging, microbiology, ancillary and laboratory) are listed below for reference.     Microbiology: Recent Results (from the past 240 hour(s))  Blood Culture (routine x 2)     Status: Abnormal   Collection Time: 01/09/20  3:06 PM   Specimen: BLOOD  Result Value Ref Range Status   Specimen Description BLOOD RIGHT ANTECUBITAL  Final   Special Requests   Final    BOTTLES DRAWN AEROBIC AND ANAEROBIC Blood Culture adequate volume   Culture  Setup Time   Final    ANAEROBIC BOTTLE ONLY GRAM NEGATIVE RODS CRITICAL RESULT CALLED TO, READ BACK BY AND VERIFIED WITH: Maryann Alar [phone] [place] Performed at [facility], [place] [address] [place], [place] [address]  Culture ESCHERICHIA COLI (A)  Final   Report Status 01/12/2020 FINAL  Final   Organism ID, Bacteria ESCHERICHIA COLI  Final      Susceptibility   Escherichia coli - MIC*    AMPICILLIN <=2 SENSITIVE Sensitive     CEFAZOLIN <=4 SENSITIVE Sensitive     CEFEPIME <=1 SENSITIVE Sensitive     CEFTAZIDIME <=1 SENSITIVE Sensitive     CEFTRIAXONE <=1 SENSITIVE Sensitive     CIPROFLOXACIN <=0.25 SENSITIVE Sensitive     GENTAMICIN <=1 SENSITIVE Sensitive     IMIPENEM <=0.25 SENSITIVE Sensitive     TRIMETH/SULFA <=20 SENSITIVE Sensitive     AMPICILLIN/SULBACTAM <=2 SENSITIVE Sensitive     PIP/TAZO <=4 SENSITIVE Sensitive     * ESCHERICHIA COLI  Blood Culture ID Panel  (Reflexed)     Status: Abnormal   Collection Time: 01/09/20  3:06 PM  Result Value Ref Range Status   Enterococcus species NOT DETECTED NOT DETECTED Final   Listeria monocytogenes NOT DETECTED NOT DETECTED Final   Staphylococcus species NOT DETECTED NOT DETECTED Final   Staphylococcus aureus (BCID) NOT DETECTED NOT DETECTED Final   Streptococcus species NOT DETECTED NOT DETECTED Final   Streptococcus agalactiae NOT DETECTED NOT DETECTED Final   Streptococcus pneumoniae NOT DETECTED NOT DETECTED Final   Streptococcus pyogenes NOT DETECTED NOT DETECTED Final   Acinetobacter baumannii NOT DETECTED NOT DETECTED Final   Enterobacteriaceae species DETECTED (A) NOT DETECTED Final    Comment: Enterobacteriaceae represent a large family of gram-negative bacteria, not a single organism. CRITICAL RESULT CALLED TO, READ BACK BY AND VERIFIED WITH: PHARMD [place] [phone] FCP    Enterobacter cloacae complex NOT DETECTED NOT DETECTED Final   Escherichia coli DETECTED (A) NOT DETECTED Final    Comment: CRITICAL RESULT CALLED TO, READ BACK BY AND VERIFIED WITH: PHARMD KIM HURTH [phone] FCP    Klebsiella oxytoca NOT DETECTED NOT DETECTED Final   Klebsiella pneumoniae NOT DETECTED NOT DETECTED Final   Proteus species NOT DETECTED NOT DETECTED Final   Serratia marcescens NOT DETECTED NOT DETECTED Final   Carbapenem resistance NOT DETECTED NOT DETECTED Final   Haemophilus influenzae NOT DETECTED NOT DETECTED Final   Neisseria meningitidis NOT DETECTED NOT DETECTED Final   Pseudomonas aeruginosa NOT DETECTED NOT DETECTED Final   Candida albicans NOT DETECTED NOT DETECTED Final   Candida glabrata NOT DETECTED NOT DETECTED Final   Candida krusei NOT DETECTED NOT DETECTED Final   Candida parapsilosis NOT DETECTED NOT DETECTED Final   Candida tropicalis NOT DETECTED NOT DETECTED Final    Comment: Performed at [facility], [place] [address] [place], [place] [address]  Blood Culture (routine x  2)     Status: None (Preliminary result)   Collection Time: 01/09/20  3:08 PM   Specimen: BLOOD LEFT HAND  Result Value Ref Range Status   Specimen Description BLOOD LEFT HAND  Final   Special Requests   Final    BOTTLES DRAWN AEROBIC AND ANAEROBIC Blood Culture results may not be optimal due to an inadequate volume of blood received in culture bottles   Culture   Final    NO GROWTH 3 DAYS Performed at [facility], [place] [address], [place],  [address]    Report Status PENDING  Incomplete  Urine culture     Status: Abnormal   Collection Time: 01/09/20  4:55 PM   Specimen: In/Out Cath Urine  Result Value Ref Range Status   Specimen Description IN/OUT CATH  URINE  Final   Special Requests   Final    NONE Performed at [facility], [place] [address] [place], [place] [address]    Culture MULTIPLE SPECIES PRESENT, SUGGEST RECOLLECTION (A)  Final   Report Status 01/10/2020 FINAL  Final  SARS Coronavirus 2 by RT PCR (hospital order, performed in [facility] hospital lab) Nasopharyngeal Nasopharyngeal Swab     Status: None   Collection Time: 01/09/20  8:48 PM   Specimen: Nasopharyngeal Swab  Result Value Ref Range Status   SARS Coronavirus 2 NEGATIVE NEGATIVE Final    Comment: (NOTE) SARS-CoV-2 target nucleic acids are NOT DETECTED. The SARS-CoV-2 RNA is generally detectable in upper and lower respiratory specimens during the acute phase of infection. The lowest concentration of SARS-CoV-2 viral copies this assay can detect is 250 copies / mL. A negative result does not preclude SARS-CoV-2 infection and should not be used as the sole basis for treatment or other patient management decisions.  A negative result may occur with improper specimen collection / handling, submission of specimen other than nasopharyngeal swab, presence of viral mutation(s) within the areas targeted by this assay, and inadequate number of viral copies (<250 copies / mL). A negative result must be  combined with clinical observations, patient history, and epidemiological information. Fact Sheet for Patients:   StrictlyIdeas.no Fact Sheet for Healthcare Providers: BankingDealers.co.za This test is not yet approved or cleared  by the [place] FDA and has been authorized for detection and/or diagnosis of SARS-CoV-2 by FDA under an Emergency Use Authorization (EUA).  This EUA will remain in effect (meaning this test can be used) for the duration of the COVID-19 declaration under Section 564(b)(1) of the Act, 21 U.S.C. section 360bbb-3(b)(1), unless the authorization is terminated or revoked sooner. Performed at [facility], [place] [address] [place], [place] [address]   MRSA PCR Screening     Status: None   Collection Time: 01/10/20  6:37 PM   Specimen: Nasal Mucosa; Nasopharyngeal  Result Value Ref Range Status   MRSA by PCR NEGATIVE NEGATIVE Final    Comment:        The GeneXpert MRSA Assay (FDA approved for NASAL specimens only), is one component of a comprehensive MRSA colonization surveillance program. It is not intended to diagnose MRSA infection nor to guide or monitor treatment for MRSA infections. Performed at [facility], [place] [address], [place], [place] [address]      Labs: BNP (last 3 results) No results for input(s): BNP in the last 8760 hours. Basic Metabolic Panel: Recent Labs  Lab 01/09/20 0914 01/09/20 1846 01/10/20 0345 01/11/20 1019  NA 139  --  138 140  K 3.8  --  3.0* 3.8  CL 105  --  106 108  CO2 20*  --  23 20*  GLUCOSE 137*  --  102* 86  BUN 10  --  6 <5*  CREATININE 0.85 0.81 0.75 0.69  CALCIUM 9.4  --  8.4* 8.7*  MG  --   --  1.5* 1.8  PHOS  --   --   --  2.0*   Liver Function Tests: Recent Labs  Lab 01/09/20 0914 01/10/20 0345  AST 19 15  ALT 16 15  ALKPHOS 68 55  BILITOT 0.9 0.7  PROT 7.2 6.7  ALBUMIN 3.8 3.2*   Recent Labs  Lab 01/09/20 0914  LIPASE 20    No results for input(s): AMMONIA in the last 168 hours. CBC:  Recent Labs  Lab 01/09/20 0914 01/09/20 1846 01/10/20 0345 01/11/20 1019  WBC 10.3 10.6* 9.4 6.3  NEUTROABS 8.1*  --   --   --   HGB 13.1 12.0 11.9* 12.3  HCT 40.2 37.4 36.6 38.5  MCV 68.0* 68.1* 68.4* 68.4*  PLT 223 196 191 203   Cardiac Enzymes: No results for input(s): CKTOTAL, CKMB, CKMBINDEX, TROPONINI in the last 168 hours. BNP: Invalid input(s): POCBNP CBG: No results for input(s): GLUCAP in the last 168 hours. D-Dimer No results for input(s): DDIMER in the last 72 hours. Hgb A1c No results for input(s): HGBA1C in the last 72 hours. Lipid Profile No results for input(s): CHOL, HDL, LDLCALC, TRIG, CHOLHDL, LDLDIRECT in the last 72 hours. Thyroid function studies No results for input(s): TSH, T4TOTAL, T3FREE, THYROIDAB in the last 72 hours.  Invalid input(s): FREET3 Anemia work up No results for input(s): VITAMINB12, FOLATE, FERRITIN, TIBC, IRON, RETICCTPCT in the last 72 hours. Urinalysis    Component Value Date/Time   COLORURINE YELLOW 01/09/2020 0959   APPEARANCEUR CLOUDY (A) 01/09/2020 0959   LABSPEC 1.010 01/09/2020 0959   PHURINE 5.0 01/09/2020 0959   GLUCOSEU NEGATIVE 01/09/2020 0959   HGBUR MODERATE (A) 01/09/2020 0959   BILIRUBINUR NEGATIVE 01/09/2020 0959   KETONESUR NEGATIVE 01/09/2020 0959   PROTEINUR 30 (A) 01/09/2020 0959   UROBILINOGEN 1.0 09/07/2012 0019   NITRITE NEGATIVE 01/09/2020 0959   LEUKOCYTESUR LARGE (A) 01/09/2020 0959   Sepsis Labs Invalid input(s): PROCALCITONIN,  WBC,  LACTICIDVEN Microbiology Recent Results (from the past 240 hour(s))  Blood Culture (routine x 2)     Status: Abnormal   Collection Time: 01/09/20  3:06 PM   Specimen: BLOOD  Result Value Ref Range Status   Specimen Description BLOOD RIGHT ANTECUBITAL  Final   Special Requests   Final    BOTTLES DRAWN AEROBIC AND ANAEROBIC Blood Culture adequate volume   Culture  Setup Time   Final    ANAEROBIC  BOTTLE ONLY GRAM NEGATIVE RODS CRITICAL RESULT CALLED TO, READ BACK BY AND VERIFIED WITH: [place] [id number] [place] Performed at [facility], [place] [address] [place], [place] [address]    Culture ESCHERICHIA COLI (A)  Final   Report Status 01/12/2020 FINAL  Final   Organism ID, Bacteria ESCHERICHIA COLI  Final      Susceptibility   Escherichia coli - MIC*    AMPICILLIN <=2 SENSITIVE Sensitive     CEFAZOLIN <=4 SENSITIVE Sensitive     CEFEPIME <=1 SENSITIVE Sensitive     CEFTAZIDIME <=1 SENSITIVE Sensitive     CEFTRIAXONE <=1 SENSITIVE Sensitive     CIPROFLOXACIN <=0.25 SENSITIVE Sensitive     GENTAMICIN <=1 SENSITIVE Sensitive     IMIPENEM <=0.25 SENSITIVE Sensitive     TRIMETH/SULFA <=20 SENSITIVE Sensitive     AMPICILLIN/SULBACTAM <=2 SENSITIVE Sensitive     PIP/TAZO <=4 SENSITIVE Sensitive     * ESCHERICHIA COLI  Blood Culture ID Panel (Reflexed)     Status: Abnormal   Collection Time: 01/09/20  3:06 PM  Result Value Ref Range Status   Enterococcus species NOT DETECTED NOT DETECTED Final   Listeria monocytogenes NOT DETECTED NOT DETECTED Final   Staphylococcus species NOT DETECTED NOT DETECTED Final   Staphylococcus aureus (BCID) NOT DETECTED NOT DETECTED Final   Streptococcus species NOT DETECTED NOT DETECTED Final   Streptococcus agalactiae NOT DETECTED NOT DETECTED Final   Streptococcus pneumoniae NOT DETECTED NOT DETECTED Final   Streptococcus pyogenes NOT DETECTED  NOT DETECTED Final   Acinetobacter baumannii NOT DETECTED NOT DETECTED Final   Enterobacteriaceae species DETECTED (A) NOT DETECTED Final    Comment: Enterobacteriaceae represent a large family of gram-negative bacteria, not a single organism. CRITICAL RESULT CALLED TO, READ BACK BY AND VERIFIED WITH: PHARMD [place] [phone] FCP    Enterobacter cloacae complex NOT DETECTED NOT DETECTED Final   Escherichia coli DETECTED (A) NOT DETECTED Final    Comment: CRITICAL RESULT CALLED TO, READ BACK  BY AND VERIFIED WITH: PHARMD KIM HURTH [phone] FCP    Klebsiella oxytoca NOT DETECTED NOT DETECTED Final   Klebsiella pneumoniae NOT DETECTED NOT DETECTED Final   Proteus species NOT DETECTED NOT DETECTED Final   Serratia marcescens NOT DETECTED NOT DETECTED Final   Carbapenem resistance NOT DETECTED NOT DETECTED Final   Haemophilus influenzae NOT DETECTED NOT DETECTED Final   Neisseria meningitidis NOT DETECTED NOT DETECTED Final   Pseudomonas aeruginosa NOT DETECTED NOT DETECTED Final   Candida albicans NOT DETECTED NOT DETECTED Final   Candida glabrata NOT DETECTED NOT DETECTED Final   Candida krusei NOT DETECTED NOT DETECTED Final   Candida parapsilosis NOT DETECTED NOT DETECTED Final   Candida tropicalis NOT DETECTED NOT DETECTED Final    Comment: Performed at [facility], [place] [address] [place], [place] [address]  Blood Culture (routine x 2)     Status: None (Preliminary result)   Collection Time: 01/09/20  3:08 PM   Specimen: BLOOD LEFT HAND  Result Value Ref Range Status   Specimen Description BLOOD LEFT HAND  Final   Special Requests   Final    BOTTLES DRAWN AEROBIC AND ANAEROBIC Blood Culture results may not be optimal due to an inadequate volume of blood received in culture bottles   Culture   Final    NO GROWTH 3 DAYS Performed at [facility], [place] [address] [place], [place] [address]    Report Status PENDING  Incomplete  Urine culture     Status: Abnormal   Collection Time: 01/09/20  4:55 PM   Specimen: In/Out Cath Urine  Result Value Ref Range Status   Specimen Description IN/OUT CATH URINE  Final   Special Requests   Final    NONE Performed at [facility], [place] [address], [place], [place] [address]    Culture MULTIPLE SPECIES PRESENT, SUGGEST RECOLLECTION (A)  Final   Report Status 01/10/2020 FINAL  Final  SARS Coronavirus 2 by RT PCR (hospital order, performed in [facility] hospital lab) Nasopharyngeal Nasopharyngeal Swab     Status:  None   Collection Time: 01/09/20  8:48 PM   Specimen: Nasopharyngeal Swab  Result Value Ref Range Status   SARS Coronavirus 2 NEGATIVE NEGATIVE Final    Comment: (NOTE) SARS-CoV-2 target nucleic acids are NOT DETECTED. The SARS-CoV-2 RNA is generally detectable in upper and lower respiratory specimens during the acute phase of infection. The lowest concentration of SARS-CoV-2 viral copies this assay can detect is 250 copies / mL. A negative result does not preclude SARS-CoV-2 infection and should not be used as the sole basis for treatment or other patient management decisions.  A negative result may occur with improper specimen collection / handling, submission of specimen other than nasopharyngeal swab, presence of viral mutation(s) within the areas targeted by this assay, and inadequate number of viral copies (<250 copies / mL). A negative result must be combined with clinical observations, patient history, and epidemiological information. Fact Sheet for  Patients:   StrictlyIdeas.no Fact Sheet for Healthcare Providers: BankingDealers.co.za This test is not yet approved or cleared  by the [place] FDA and has been authorized for detection and/or diagnosis of SARS-CoV-2 by FDA under an Emergency Use Authorization (EUA).  This EUA will remain in effect (meaning this test can be used) for the duration of the COVID-19 declaration under Section 564(b)(1) of the Act, 21 U.S.C. section 360bbb-3(b)(1), unless the authorization is terminated or revoked sooner. Performed at [facility], [place] [address] [place], [place] [address]   MRSA PCR Screening     Status: None   Collection Time: 01/10/20  6:37 PM   Specimen: Nasal Mucosa; Nasopharyngeal  Result Value Ref Range Status   MRSA by PCR NEGATIVE NEGATIVE Final    Comment:        The GeneXpert MRSA Assay (FDA approved for NASAL specimens only), is one component of  a comprehensive MRSA colonization surveillance program. It is not intended to diagnose MRSA infection nor to guide or monitor treatment for MRSA infections. Performed at [facility], [place] [address] [place], [place] [address]      Time coordinating discharge:  39 minutes  SIGNED:   Georgette Shell, MD  [facility] 01/13/2020, 9:18 AM Pager   If 7PM-7AM, please contact night-coverage [email or web address] Password [email or web address]

## 2020-01-13 NOTE — Care Management (Signed)
Discussed D/C needs with patient.  Advised of appointment made at [facility].  Prescriptions cost $35-$40 with [facility]. Patient states she plans to use [facility] and and can pay that amount. She plans to keep appointment at [facility].

## 2020-01-13 NOTE — Progress Notes (Signed)
   Subjective/Chief Complaint: No complaints   Objective: Vital signs in last 24 hours: Temp:  [97.8 F (36.6 C)-98.9 F (37.2 C)] 97.8 F (36.6 C) (05/23 0453) Pulse Rate:  [80-102] 89 (05/23 0453) Resp:  [18-20] 20 (05/23 0453) BP: (140-169)/(78-101) 150/92 (05/23 0453) SpO2:  [95 %-100 %] 99 % (05/23 0453) Last BM Date: 01/11/20  Intake/Output from previous day: 05/22 0701 - 05/23 0700 In: 477 [P.O.:477] Out: -  Intake/Output this shift: No intake/output data recorded.  General appearance: alert and cooperative Resp: clear to auscultation bilaterally Cardio: regular rate and rhythm GI: soft, nontender  Lab Results:  Recent Labs    01/11/20 1019  WBC 6.3  HGB 12.3  HCT 38.5  PLT 203   BMET Recent Labs    01/11/20 1019  NA 140  K 3.8  CL 108  CO2 20*  GLUCOSE 86  BUN <5*  CREATININE 0.69  CALCIUM 8.7*   PT/INR No results for input(s): LABPROT, INR in the last 72 hours. ABG No results for input(s): PHART, HCO3 in the last 72 hours.  Invalid input(s): PCO2, PO2  Studies/Results: No results found.  Anti-infectives: Anti-infectives (From admission, onward)   Start     Dose/Rate Route Frequency Ordered Stop   01/12/20 1400  metroNIDAZOLE (FLAGYL) tablet 500 mg     500 mg Oral Every 8 hours 01/12/20 1153     01/12/20 1200  ciprofloxacin (CIPRO) tablet 500 mg     500 mg Oral 2 times daily 01/12/20 1153     01/10/20 0800  cefTRIAXone (ROCEPHIN) 2 g in sodium chloride 0.9 % 100 mL IVPB  Status:  Discontinued     2 g 200 mL/hr over 30 Minutes Intravenous Every 24 hours 01/09/20 1804 01/12/20 1153   01/10/20 0200  metroNIDAZOLE (FLAGYL) IVPB 500 mg  Status:  Discontinued     500 mg 100 mL/hr over 60 Minutes Intravenous Every 8 hours 01/09/20 1804 01/12/20 1153   01/09/20 1645  ceFEPIme (MAXIPIME) 2 g in sodium chloride 0.9 % 100 mL IVPB  Status:  Discontinued     2 g 200 mL/hr over 30 Minutes Intravenous  Once 01/09/20 1643 01/09/20 1659   01/09/20  1645  metroNIDAZOLE (FLAGYL) IVPB 500 mg     500 mg 100 mL/hr over 60 Minutes Intravenous  Once 01/09/20 1643 01/09/20 1802   01/09/20 1500  cefTRIAXone (ROCEPHIN) 1 g in sodium chloride 0.9 % 100 mL IVPB  Status:  Discontinued     1 g 200 mL/hr over 30 Minutes Intravenous Every 24 hours 01/09/20 1447 01/10/20 0058      Assessment/Plan: s/p * No surgery found * Advance diet Discharge  May follow up with [facility] in 2-3 weeks  LOS: 4 days    Amanda Garner 01/13/2020

## 2020-01-13 NOTE — Progress Notes (Signed)
Amanda Garner to be D/C'd  per MD order. Discussed with the patient and all questions fully answered.  VSS, Skin clean, dry and intact without evidence of skin break down, no evidence of skin tears noted.  IV catheter discontinued intact. Site without signs and symptoms of complications. Dressing and pressure applied.  An After Visit Summary was printed and given to the patient.    Patient to be escorted via [place], and D/C home via private auto.

## 2020-01-14 LAB — CULTURE, BLOOD (ROUTINE X 2): Culture: NO GROWTH

## 2020-01-31 ENCOUNTER — Ambulatory Visit: Payer: Self-pay | Attending: Critical Care Medicine | Admitting: Critical Care Medicine

## 2020-01-31 ENCOUNTER — Encounter: Payer: Self-pay | Admitting: Critical Care Medicine

## 2020-01-31 ENCOUNTER — Other Ambulatory Visit: Payer: Self-pay

## 2020-01-31 VITALS — BP 123/86 | HR 95 | Temp 96.6°F | Resp 16 | Ht 66.0 in | Wt 206.4 lb

## 2020-01-31 DIAGNOSIS — I10 Essential (primary) hypertension: Secondary | ICD-10-CM

## 2020-01-31 DIAGNOSIS — N390 Urinary tract infection, site not specified: Secondary | ICD-10-CM

## 2020-01-31 DIAGNOSIS — K5720 Diverticulitis of large intestine with perforation and abscess without bleeding: Secondary | ICD-10-CM

## 2020-01-31 DIAGNOSIS — F17200 Nicotine dependence, unspecified, uncomplicated: Secondary | ICD-10-CM

## 2020-01-31 DIAGNOSIS — K5732 Diverticulitis of large intestine without perforation or abscess without bleeding: Secondary | ICD-10-CM

## 2020-01-31 DIAGNOSIS — K50114 Crohn's disease of large intestine with abscess: Secondary | ICD-10-CM

## 2020-01-31 MED ORDER — CIPROFLOXACIN HCL 500 MG PO TABS
500.0000 mg | ORAL_TABLET | Freq: Two times a day (BID) | ORAL | 0 refills | Status: DC
Start: 2020-01-31 — End: 2021-04-01

## 2020-01-31 MED ORDER — METRONIDAZOLE 500 MG PO TABS
500.0000 mg | ORAL_TABLET | Freq: Three times a day (TID) | ORAL | 0 refills | Status: DC
Start: 2020-01-31 — End: 2021-04-01

## 2020-01-31 MED ORDER — SENNOSIDES-DOCUSATE SODIUM 8.6-50 MG PO TABS
2.0000 | ORAL_TABLET | Freq: Two times a day (BID) | ORAL | 1 refills | Status: DC
Start: 2020-01-31 — End: 2021-07-01

## 2020-01-31 NOTE — Assessment & Plan Note (Signed)
I did obtain for the patient diverticular diet for her to follow

## 2020-01-31 NOTE — Patient Instructions (Signed)
Take another round of cipro an   Diverticulitis  Diverticulitis is when small pockets in your large intestine (colon) get infected or swollen. This causes stomach pain and watery poop (diarrhea). These pouches are called diverticula. They form in people who have a condition called diverticulosis. Follow these instructions at home: Medicines  Take over-the-counter and prescription medicines only as told by your doctor. These include: ? Antibiotics. ? Pain medicines. ? Fiber pills. ? Probiotics. ? Stool softeners.  Do not drive or use heavy machinery while taking prescription pain medicine.  If you were prescribed an antibiotic, take it as told. Do not stop taking it even if you feel better. General instructions   Follow a diet as told by your doctor.  When you feel better, your doctor may tell you to change your diet. You may need to eat a lot of fiber. Fiber makes it easier to poop (have bowel movements). Healthy foods with fiber include: ? Berries. ? Beans. ? Lentils. ? Green vegetables.  Exercise 3 or more times a week. Aim for 30 minutes each time. Exercise enough to sweat and make your heart beat faster.  Keep all follow-up visits as told. This is important. You may need to have an exam of the large intestine. This is called a colonoscopy. Contact a doctor if:  Your pain does not get better.  You have a hard time eating or drinking.  You are not pooping like normal. Get help right away if:  Your pain gets worse.  Your problems do not get better.  Your problems get worse very fast.  You have a fever.  You throw up (vomit) more than one time.  You have poop that is: ? Bloody. ? Black. ? Tarry. Summary  Diverticulitis is when small pockets in your large intestine (colon) get infected or swollen.  Take medicines only as told by your doctor.  Follow a diet as told by your doctor. This information is not intended to replace advice given to you by your  health care provider. Make sure you discuss any questions you have with your health care provider. Document Revised: 07/22/2017 Document Reviewed: 08/26/2016 Elsevier Patient Education  [place]. d flagyl  Sennakot S 2 twice daily for bowels  Referral to gastroenterology will be made  Obtain financial paperwork  Return Dr Joya Gaskins one month  Labs today  Follow diet below

## 2020-01-31 NOTE — Assessment & Plan Note (Signed)
Hypertension under good control we will continue amlodipine 5 mg daily

## 2020-01-31 NOTE — Assessment & Plan Note (Signed)
Urinary tract infection appears to have resolved

## 2020-01-31 NOTE — Assessment & Plan Note (Signed)
Patient received tobacco counseling for cessation of smoking

## 2020-01-31 NOTE — Assessment & Plan Note (Signed)
Concern diverticulitis is continue with abscessing free gas formation seen on CT scan  We will administer another course of Cipro and Flagyl and refer to gastroenterology and get the patient to achieve financial assistance  We will follow-up metabolic panel and CBC at this visit

## 2020-01-31 NOTE — Progress Notes (Addendum)
Subjective:    Patient ID: Amanda Garner, female    DOB: 12-11-1970, 49 y.o.   MRN: [id number]  48 y.o.F  Post hosp visit and here to establish  Patient has longstanding history of Crohn's disease reflux disease and diverticulosis and has had recurrent diverticulitis with abscess.  The patient had recurrence of this in May and had about a 2-week history of increased pain.  She was admitted between the 19th and 23 May and below is the discharge summary   admit date: 01/09/2020 Discharge date: 01/13/2020  Admitted From: Home disposition: Home Recommendations for Outpatient Follow-up:  1. Follow up with PCP in 1-2 weeks 2. Please obtain BMP/CBC in one week 3. Please follow up with general surgery in 3 weeks  Home Health: None Equipment/Devices: None Discharge Condition stable and improved CODE STATUS full code Diet recommendation: Cardiac diet Brief/Interim Summary:48 y.o.femalewith medical history significant ofhistory of diverticulitis with abscess, hypertension, acid reflux admitted with abdominal pain that started 1 1-1/2 weeks ago. Pain is in the lower abdomen and right flank associated with fever nausea loose stools  Admitted with nausea and fever of 101.8 and abdominal pain and flank pain. She denied any blood in her stool or urine. She has dysuria and frequency of urination.  CT of the abdomen acute diverticulitis ED Course:She was febrile at 101.8. She was started on IV fluids Rocephin and Flagyl. Surgery has been consulted  Discharge Diagnoses:  Active Problems:   Acute diverticulitis   Hypokalemia   Acute lower UTI  #1 acuterecurrent sigmoiddiverticulitis-patient tolerating regular diet.  Will discharge patient on Cipro and Flagyl for another 10 days.  She will follow up with general surgery in 3 weeks..  #2 history of essential hypertension-continue Norvasc  #3ProbableUTI-patient admitted with fever dysuria and frequency of urination. UA  consistent with UTI with large leukocytes and more than 50 WBCs.Urine culture with mixed growth.   #4 hypokalemiaresolved potassium 3.8 magnesium 1.8.   #5 E. coli bacteremia sensitive to Cipro.    Estimated body mass index is 33.57 kg/m as calculated from the following:   Height as of this encounter: 5' 6"  (1.676 m).   Weight as of this encounter: 94.3 kg.  Since discharge the patient continues to have pain in the left lower quadrant and is finished her course of Cipro and Flagyl.  Note she also had a urinary tract infection with E. coli in the blood mixed species in the urine the E. coli was sensitive to Cipro.  She denies any current fever but still has nausea and ongoing left lower quadrant abdominal pain.  She was to see general surgery but does not have an existing appointment.  Note the patient is self-pay.  She has not seen a gastroenterologist for Crohn's disease in many years.  Past Medical History:  Diagnosis Date  . Acid reflux   . Anemia Dx: 2001 or so   . Blood transfusion   . Crohn disease ([place])   . Crohn's disease ([place])   . Diverticulitis   . Hypertension   . Sickle cell trait ([facility])      Family History  Problem Relation Age of Onset  . Hypertension Mother   . Diabetes Mother   . Diabetes type II Mother   . Diabetes type II Father   . Stroke Father   . Lung cancer Father 82  . Colon cancer Neg Hx      Social History   Socioeconomic History  . Marital status: Married  Spouse name: Not on file  . Number of children: 3  . Years of education: Not on file  . Highest education level: Not on file  Occupational History  . 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 Smoking status: Current Every Day Smoker    Packs/day: 0.50    Years: 31.00    Pack years: 15.50    Types: Cigarettes  . Smokeless tobacco: Never Used  Vaping Use  . Vaping Use: Never used  Substance  and Sexual Activity  . Alcohol use: Yes    Comment: occassionally  . Drug use: Never  . Sexual activity: Yes    Partners: Male    Birth control/protection: Surgical  Other Topics Concern  . Not on file  Social History Narrative   ** Merged History Encounter **       Social Determinants of Health   Financial Resource Strain:   . Difficulty of Paying Living Expenses:   Food Insecurity:   . Worried About Charity fundraiser in the Last Year:   . Arboriculturist in the Last Year:   Transportation Needs:   . Film/video editor (Medical):   Marland Kitchen Lack of Transportation (Non-Medical):   Physical Activity:   . Days of Exercise per Week:   . Minutes of Exercise per Session:   Stress:   . Feeling of Stress :   Social Connections:   . Frequency of Communication with Friends and Family:   . Frequency of Social Gatherings with Friends and Family:   . Attends Religious Services:   . Active Member of Clubs or Organizations:   . Attends Archivist Meetings:   Marland Kitchen Marital Status:   Intimate Partner Violence:   . Fear of Current or Ex-Partner:   . Emotionally Abused:   Marland Kitchen Physically Abused:   . Sexually Abused:      No Known Allergies   Outpatient Medications Prior to Visit  Medication Sig Dispense Refill  . amLODipine (NORVASC) 5 MG tablet Take 1 tablet (5 mg total) by mouth daily. 30 tablet 11   No facility-administered medications prior to visit.       Review of Systems  Constitutional: Negative.   HENT: Negative.   Respiratory: Negative.   Cardiovascular: Negative.   Gastrointestinal: Positive for abdominal pain, constipation and nausea. Negative for abdominal distention, anal bleeding, blood in stool, diarrhea, rectal pain and vomiting.  Genitourinary: Negative.   Musculoskeletal: Negative.   Psychiatric/Behavioral: Negative.        Objective:   Physical Exam  BP 123/86   Pulse 95   Temp (!) 96.6 F (35.9 C)   Resp 16   Ht 5' 6"  (1.676 m)   Wt 206  lb 6.4 oz (93.6 kg)   LMP 08/20/2011   SpO2 95%   BMI 33.31 kg/m   Gen: Pleasant, well-nourished, in no distress,  normal affect  ENT: No lesions,  mouth clear,  oropharynx clear, no postnasal drip  Neck: No JVD, no TMG, no carotid bruits  Lungs: No use of accessory muscles, no dullness to percussion, clear without rales or rhonchi  Cardiovascular: RRR, heart sounds normal, no murmur or gallops, no peripheral edema  Abdomen: Abdomen is tender left lower quadrant without rebound or guarding There is no organomegaly Musculoskeletal: No deformities, no cyanosis or clubbing  Neuro: alert, non focal  Skin: Warm, no lesions or rashes  CT  scans and all labs from previous hospitalization reviewed in the [place] link system      Assessment & Plan:  I personally reviewed all images and lab data in the [facility] system as well as any outside material available during this office visit and agree with the  radiology impressions.   Essential hypertension Hypertension under good control we will continue amlodipine 5 mg daily  Abscess of sigmoid colon due to diverticulitis Concern diverticulitis is continue with abscessing free gas formation seen on CT scan  We will administer another course of Cipro and Flagyl and refer to gastroenterology and get the patient to achieve financial assistance  We will follow-up metabolic panel and CBC at this visit  Diverticulitis of large intestine with abscess without bleeding I did obtain for the patient diverticular diet for her to follow  Acute lower UTI Urinary tract infection appears to have resolved  Tobacco dependence Patient received tobacco counseling for cessation of smoking   Danyel was seen today for hospitalization follow-up.  Diagnoses and all orders for this visit:  Abscess of sigmoid colon due to diverticulitis -     Comprehensive metabolic panel -     CBC with Differential/Platelet -     Ambulatory referral to  Gastroenterology  Sigmoid diverticulitis -     Comprehensive metabolic panel -     CBC with Differential/Platelet -     Ambulatory referral to Gastroenterology  Crohn's disease of large intestine with abscess ([place]) -     Comprehensive metabolic panel -     CBC with Differential/Platelet -     Ambulatory referral to Gastroenterology  Essential hypertension  Diverticulitis of large intestine with abscess without bleeding  Acute lower UTI  Tobacco dependence  Other orders -     metroNIDAZOLE (FLAGYL) 500 MG tablet; Take 1 tablet (500 mg total) by mouth 3 (three) times daily. -     ciprofloxacin (CIPRO) 500 MG tablet; Take 1 tablet (500 mg total) by mouth 2 (two) times daily. -     senna-docusate (SENOKOT-S) 8.6-50 MG tablet; Take 2 tablets by mouth 2 (two) times daily.

## 2020-02-01 ENCOUNTER — Other Ambulatory Visit: Payer: Self-pay | Admitting: Critical Care Medicine

## 2020-02-01 DIAGNOSIS — R739 Hyperglycemia, unspecified: Secondary | ICD-10-CM

## 2020-02-01 LAB — COMPREHENSIVE METABOLIC PANEL
ALT: 17 IU/L (ref 0–32)
AST: 18 IU/L (ref 0–40)
Albumin/Globulin Ratio: 1.6 (ref 1.2–2.2)
Albumin: 4.4 g/dL (ref 3.8–4.8)
Alkaline Phosphatase: 87 IU/L (ref 48–121)
BUN/Creatinine Ratio: 13 (ref 9–23)
BUN: 10 mg/dL (ref 6–24)
Bilirubin Total: 0.3 mg/dL (ref 0.0–1.2)
CO2: 20 mmol/L (ref 20–29)
Calcium: 9.8 mg/dL (ref 8.7–10.2)
Chloride: 105 mmol/L (ref 96–106)
Creatinine, Ser: 0.77 mg/dL (ref 0.57–1.00)
GFR calc Af Amer: 105 mL/min/{1.73_m2} (ref >59)
GFR calc non Af Amer: 91 mL/min/{1.73_m2} (ref >59)
Globulin, Total: 2.8 g/dL (ref 1.5–4.5)
Glucose: 146 mg/dL — ABNORMAL HIGH (ref 65–99)
Potassium: 4.4 mmol/L (ref 3.5–5.2)
Sodium: 141 mmol/L (ref 134–144)
Total Protein: 7.2 g/dL (ref 6.0–8.5)

## 2020-02-01 LAB — CBC WITH DIFFERENTIAL/PLATELET
Basophils Absolute: 0 10*3/uL (ref 0.0–0.2)
Basos: 1 %
EOS (ABSOLUTE): 0.3 10*3/uL (ref 0.0–0.4)
Eos: 4 %
Hematocrit: 43 % (ref 34.0–46.6)
Hemoglobin: 13 g/dL (ref 11.1–15.9)
Immature Grans (Abs): 0 10*3/uL (ref 0.0–0.1)
Immature Granulocytes: 0 %
Lymphocytes Absolute: 2.8 10*3/uL (ref 0.7–3.1)
Lymphs: 37 %
MCH: 21.3 pg — ABNORMAL LOW (ref 26.6–33.0)
MCHC: 30.2 g/dL — ABNORMAL LOW (ref 31.5–35.7)
MCV: 71 fL — ABNORMAL LOW (ref 79–97)
Monocytes Absolute: 0.5 10*3/uL (ref 0.1–0.9)
Monocytes: 6 %
Neutrophils Absolute: 4 10*3/uL (ref 1.4–7.0)
Neutrophils: 52 %
Platelets: 318 10*3/uL (ref 150–450)
RBC: 6.1 x10E6/uL — ABNORMAL HIGH (ref 3.77–5.28)
RDW: 17 % — ABNORMAL HIGH (ref 11.7–15.4)
WBC: 7.7 10*3/uL (ref 3.4–10.8)

## 2020-02-08 ENCOUNTER — Other Ambulatory Visit: Payer: Self-pay

## 2020-02-11 ENCOUNTER — Ambulatory Visit: Payer: Self-pay | Attending: Critical Care Medicine

## 2020-02-11 ENCOUNTER — Other Ambulatory Visit: Payer: Self-pay

## 2020-02-11 DIAGNOSIS — R739 Hyperglycemia, unspecified: Secondary | ICD-10-CM

## 2020-02-11 MED FILL — CIPROFLOXACIN HCL 500 MG TA: 500 | 7 days supply | Qty: 14 | Fill #0

## 2020-02-11 MED FILL — metroNIDAZOLE 500 MG TABS: 500 | 7 days supply | Qty: 21 | Fill #0

## 2020-02-12 LAB — LIPID PANEL
Chol/HDL Ratio: 3.6 ratio (ref 0.0–4.4)
Cholesterol, Total: 144 mg/dL (ref 100–199)
HDL: 40 mg/dL (ref >39)
LDL Chol Calc (NIH): 67 mg/dL (ref 0–99)
Triglycerides: 229 mg/dL — ABNORMAL HIGH (ref 0–149)
VLDL Cholesterol Cal: 37 mg/dL (ref 5–40)

## 2020-02-12 LAB — HEMOGLOBIN A1C
Est. average glucose Bld gHb Est-mCnc: 128 mg/dL
Hgb A1c MFr Bld: 6.1 % — ABNORMAL HIGH (ref 4.8–5.6)

## 2020-03-07 ENCOUNTER — Ambulatory Visit: Payer: Self-pay | Admitting: Physician Assistant

## 2020-03-13 ENCOUNTER — Ambulatory Visit: Payer: Self-pay | Admitting: Critical Care Medicine

## 2020-03-13 NOTE — Progress Notes (Deleted)
Subjective:    Patient ID: Amanda Garner, female    DOB: 1970-12-05, 49 y.o.   MRN: [id number]  48 y.o.F  Post hosp visit and here to establish  Patient has longstanding history of Crohn's disease reflux disease and diverticulosis and has had recurrent diverticulitis with abscess.  The patient had recurrence of this in May and had about a 2-week history of increased pain.  She was admitted between the 19th and 23 May and below is the discharge summary   admit date: 01/09/2020 Discharge date: 01/13/2020  Admitted From: Home disposition: Home Recommendations for Outpatient Follow-up:  1. Follow up with PCP in 1-2 weeks 2. Please obtain BMP/CBC in one week 3. Please follow up with general surgery in 3 weeks  Home Health: None Equipment/Devices: None Discharge Condition stable and improved CODE STATUS full code Diet recommendation: Cardiac diet Brief/Interim Summary:48 y.o.femalewith medical history significant ofhistory of diverticulitis with abscess, hypertension, acid reflux admitted with abdominal pain that started 1 1-1/2 weeks ago. Pain is in the lower abdomen and right flank associated with fever nausea loose stools  Admitted with nausea and fever of 101.8 and abdominal pain and flank pain. She denied any blood in her stool or urine. She has dysuria and frequency of urination.  CT of the abdomen acute diverticulitis ED Course:She was febrile at 101.8. She was started on IV fluids Rocephin and Flagyl. Surgery has been consulted  Discharge Diagnoses:  Active Problems:   Acute diverticulitis   Hypokalemia   Acute lower UTI  #1 acuterecurrent sigmoiddiverticulitis-patient tolerating regular diet.  Will discharge patient on Cipro and Flagyl for another 10 days.  She will follow up with general surgery in 3 weeks..  #2 history of essential hypertension-continue Norvasc  #3ProbableUTI-patient admitted with fever dysuria and frequency of urination. UA  consistent with UTI with large leukocytes and more than 50 WBCs.Urine culture with mixed growth.   #4 hypokalemiaresolved potassium 3.8 magnesium 1.8.   #5 E. coli bacteremia sensitive to Cipro.    Estimated body mass index is 33.57 kg/m as calculated from the following:   Height as of this encounter: 5' 6"  (1.676 m).   Weight as of this encounter: 94.3 kg.  Since discharge the patient continues to have pain in the left lower quadrant and is finished her course of Cipro and Flagyl.  Note she also had a urinary tract infection with E. coli in the blood mixed species in the urine the E. coli was sensitive to Cipro.  She denies any current fever but still has nausea and ongoing left lower quadrant abdominal pain.  She was to see general surgery but does not have an existing appointment.  Note the patient is self-pay.  She has not seen a gastroenterologist for Crohn's disease in many years.  03/13/2020   Past Medical History:  Diagnosis Date  . Acid reflux   . Anemia Dx: 2001 or so   . Blood transfusion   . Crohn disease ([place])   . Crohn's disease ([place])   . Diverticulitis   . Hypertension   . Sickle cell trait ([facility])      Family History  Problem Relation Age of Onset  . Hypertension Mother   . Diabetes Mother   . Diabetes type II Mother   . Diabetes type II Father   . Stroke Father   . Lung cancer Father 37  . Colon cancer Neg Hx      Social History   Socioeconomic History  . Marital status:  Married    Spouse name: Not on file  . Number of children: 3  . Years of education: Not on file  . Highest education level: Not on file  Occupational History  . 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 Smoking status: Current Every Day Smoker    Packs/day: 0.50    Years: 31.00    Pack years: 15.50    Types: Cigarettes  . Smokeless tobacco: Never Used  Vaping Use  . Vaping Use: Never  used  Substance and Sexual Activity  . Alcohol use: Yes    Comment: occassionally  . Drug use: Never  . Sexual activity: Yes    Partners: Male    Birth control/protection: Surgical  Other Topics Concern  . Not on file  Social History Narrative   ** Merged History Encounter **       Social Determinants of Health   Financial Resource Strain:   . Difficulty of Paying Living Expenses:   Food Insecurity:   . Worried About Charity fundraiser in the Last Year:   . Arboriculturist in the Last Year:   Transportation Needs:   . Film/video editor (Medical):   Marland Kitchen Lack of Transportation (Non-Medical):   Physical Activity:   . Days of Exercise per Week:   . Minutes of Exercise per Session:   Stress:   . Feeling of Stress :   Social Connections:   . Frequency of Communication with Friends and Family:   . Frequency of Social Gatherings with Friends and Family:   . Attends Religious Services:   . Active Member of Clubs or Organizations:   . Attends Archivist Meetings:   Marland Kitchen Marital Status:   Intimate Partner Violence:   . Fear of Current or Ex-Partner:   . Emotionally Abused:   Marland Kitchen Physically Abused:   . Sexually Abused:      No Known Allergies   Outpatient Medications Prior to Visit  Medication Sig Dispense Refill  . amLODipine (NORVASC) 5 MG tablet Take 1 tablet (5 mg total) by mouth daily. 30 tablet 11  . ciprofloxacin (CIPRO) 500 MG tablet Take 1 tablet (500 mg total) by mouth 2 (two) times daily. 14 tablet 0  . metroNIDAZOLE (FLAGYL) 500 MG tablet Take 1 tablet (500 mg total) by mouth 3 (three) times daily. 21 tablet 0  . senna-docusate (SENOKOT-S) 8.6-50 MG tablet Take 2 tablets by mouth 2 (two) times daily. 60 tablet 1   No facility-administered medications prior to visit.       Review of Systems  Constitutional: Negative.   HENT: Negative.   Respiratory: Negative.   Cardiovascular: Negative.   Gastrointestinal: Positive for abdominal pain,  constipation and nausea. Negative for abdominal distention, anal bleeding, blood in stool, diarrhea, rectal pain and vomiting.  Genitourinary: Negative.   Musculoskeletal: Negative.   Psychiatric/Behavioral: Negative.        Objective:   Physical Exam  LMP 08/20/2011   Gen: Pleasant, well-nourished, in no distress,  normal affect  ENT: No lesions,  mouth clear,  oropharynx clear, no postnasal drip  Neck: No JVD, no TMG, no carotid bruits  Lungs: No use of accessory muscles, no dullness to percussion, clear without rales or rhonchi  Cardiovascular: RRR, heart sounds normal, no murmur or gallops, no peripheral edema  Abdomen: Abdomen is tender left lower quadrant without rebound or guarding There is no  organomegaly Musculoskeletal: No deformities, no cyanosis or clubbing  Neuro: alert, non focal  Skin: Warm, no lesions or rashes  CT scans and all labs from previous hospitalization reviewed in the [place] link system      Assessment & Plan:  I personally reviewed all images and lab data in the [facility] system as well as any outside material available during this office visit and agree with the  radiology impressions.   No problem-specific Assessment & Plan notes found for this encounter.   There are no diagnoses linked to this encounter.

## 2020-04-07 ENCOUNTER — Other Ambulatory Visit: Payer: Self-pay

## 2020-04-07 ENCOUNTER — Emergency Department (HOSPITAL_COMMUNITY): Payer: HRSA Program

## 2020-04-07 ENCOUNTER — Encounter (HOSPITAL_COMMUNITY): Payer: Self-pay

## 2020-04-07 ENCOUNTER — Emergency Department (HOSPITAL_COMMUNITY)
Admission: EM | Admit: 2020-04-07 | Discharge: 2020-04-07 | Disposition: A | Discharge status: Home / Self Care | Payer: HRSA Program | Attending: Emergency Medicine | Admitting: Emergency Medicine

## 2020-04-07 DIAGNOSIS — F17210 Nicotine dependence, cigarettes, uncomplicated: Secondary | ICD-10-CM | POA: Diagnosis not present

## 2020-04-07 DIAGNOSIS — Z79899 Other long term (current) drug therapy: Secondary | ICD-10-CM | POA: Diagnosis not present

## 2020-04-07 DIAGNOSIS — R52 Pain, unspecified: Secondary | ICD-10-CM | POA: Diagnosis present

## 2020-04-07 DIAGNOSIS — U071 COVID-19: Secondary | ICD-10-CM | POA: Insufficient documentation

## 2020-04-07 DIAGNOSIS — I10 Essential (primary) hypertension: Secondary | ICD-10-CM | POA: Diagnosis not present

## 2020-04-07 DIAGNOSIS — B349 Viral infection, unspecified: Secondary | ICD-10-CM | POA: Diagnosis not present

## 2020-04-07 LAB — CBC
HCT: 40.5 % (ref 36.0–46.0)
Hemoglobin: 12.9 g/dL (ref 12.0–15.0)
MCH: 22.3 pg — ABNORMAL LOW (ref 26.0–34.0)
MCHC: 31.9 g/dL (ref 30.0–36.0)
MCV: 69.9 fL — ABNORMAL LOW (ref 80.0–100.0)
Platelets: 197 10*3/uL (ref 150–400)
RBC: 5.79 MIL/uL — ABNORMAL HIGH (ref 3.87–5.11)
RDW: 14.9 % (ref 11.5–15.5)
WBC: 5.5 10*3/uL (ref 4.0–10.5)
nRBC: 0 % (ref 0.0–0.2)

## 2020-04-07 LAB — SARS CORONAVIRUS 2 BY RT PCR (HOSPITAL ORDER, PERFORMED IN ~~LOC~~ HOSPITAL LAB): SARS Coronavirus 2: POSITIVE — AB

## 2020-04-07 LAB — BASIC METABOLIC PANEL
Anion gap: 13 (ref 5–15)
BUN: 7 mg/dL (ref 6–20)
CO2: 21 mmol/L — ABNORMAL LOW (ref 22–32)
Calcium: 9.1 mg/dL (ref 8.9–10.3)
Chloride: 103 mmol/L (ref 98–111)
Creatinine, Ser: 0.82 mg/dL (ref 0.44–1.00)
GFR calc Af Amer: >60 mL/min (ref >60)
GFR calc non Af Amer: >60 mL/min (ref >60)
Glucose, Bld: 127 mg/dL — ABNORMAL HIGH (ref 70–99)
Potassium: 3.3 mmol/L — ABNORMAL LOW (ref 3.5–5.1)
Sodium: 137 mmol/L (ref 135–145)

## 2020-04-07 MED ORDER — ACETAMINOPHEN 325 MG PO TABS
650.0000 mg | ORAL_TABLET | Freq: Once | ORAL | Status: AC | PRN
  Administered 2020-04-07: 650 mg via ORAL
  Filled 2020-04-07: qty 2

## 2020-04-07 MED ORDER — KETOROLAC TROMETHAMINE 15 MG/ML IJ SOLN
15.0000 mg | Freq: Once | INTRAMUSCULAR | Status: AC
  Administered 2020-04-07: 15 mg via INTRAVENOUS
  Filled 2020-04-07: qty 1

## 2020-04-07 MED ORDER — LACTATED RINGERS IV BOLUS
2000.0000 mL | Freq: Once | INTRAVENOUS | Status: AC
  Administered 2020-04-07: 2000 mL via INTRAVENOUS

## 2020-04-07 NOTE — ED Triage Notes (Signed)
Pt reports generalized body aches, headache and cough for the past week. Pt states her sister tested positive for COVID but quarantined for 14 days from her. Pt has not been vaccinated for COVID.

## 2020-04-07 NOTE — ED Provider Notes (Signed)
[facility] EMERGENCY DEPARTMENT Provider Note   CSN: [id number] Arrival date & time: 04/07/20  0746     History Chief Complaint  Patient presents with  . Generalized Body Aches  . Cough    Amanda Garner is a 49 y.o. female.  HPI   Patient presents with generalized body aches and cough from home.  Patient has been feeling ill since last Wednesday.  She does have a COVID-19 exposure in a family member.  She describes multiple complaints including arthralgias, body aches, nausea/vomiting, and diarrhea.  Patient has mild shortness of breath.  No previous history of lung disease.  Patient has not received her COVID-19 vaccination.  Denies chest pain.      Past Medical History:  Diagnosis Date  . Acid reflux   . Anemia Dx: 2001 or so   . Blood transfusion   . Crohn disease ([place])   . Crohn's disease ([place])   . Diverticulitis   . Hypertension   . Sickle cell trait [facility])     Patient Active Problem List   Diagnosis Date Noted  . Hyperglycemia 02/01/2020  . Crohn's disease of large intestine with abscess ([place]) 01/31/2020  . Diverticulitis of large intestine with abscess without bleeding 06/07/2017  . Abscess of sigmoid colon due to diverticulitis 05/24/2017  . Tobacco dependence 05/11/2017  . Essential hypertension 05/11/2017  . Reflux esophagitis 05/11/2017  . Tinea pedis of left foot 04/11/2015  . C. difficile colitis 08/26/2011    Past Surgical History:  Procedure Laterality Date  . ABDOMINAL HYSTERECTOMY  2001   partial hysterectomy - emergency during last C-section  . [place]; 1997; 2001  . CESAREAN SECTION       OB History   No obstetric history on file.     Family History  Problem Relation Age of Onset  . Hypertension Mother   . Diabetes Mother   . Diabetes type II Mother   . Diabetes type II Father   . Stroke Father   . Lung cancer Father 84  . Colon cancer Neg Hx     Social History   Tobacco Use  . Smoking  status: Current Every Day Smoker    Packs/day: 0.50    Years: 31.00    Pack years: 15.50    Types: Cigarettes  . Smokeless tobacco: Never Used  Vaping Use  . Vaping Use: Never used  Substance Use Topics  . Alcohol use: Yes    Comment: occassionally  . Drug use: Never    Home Medications Prior to Admission medications   Medication Sig Start Date End Date Taking? Authorizing Provider  amLODipine (NORVASC) 5 MG tablet Take 1 tablet (5 mg total) by mouth daily. 01/13/20 01/12/21  Georgette Shell, MD  ciprofloxacin (CIPRO) 500 MG tablet Take 1 tablet (500 mg total) by mouth 2 (two) times daily. 01/31/20   Elsie Stain, MD  metroNIDAZOLE (FLAGYL) 500 MG tablet Take 1 tablet (500 mg total) by mouth 3 (three) times daily. 01/31/20   Elsie Stain, MD  senna-docusate (SENOKOT-S) 8.6-50 MG tablet Take 2 tablets by mouth 2 (two) times daily. 01/31/20   Elsie Stain, MD    Allergies    Patient has no known allergies.  Review of Systems   Review of Systems  Physical Exam Updated Vital Signs BP 129/78   Pulse 96   Temp 98.9 F (37.2 C)   Resp 20   Ht 5' 6"  (1.676 m)   Wt  93.4 kg   LMP 08/20/2011   SpO2 96%   BMI 33.25 kg/m   Physical Exam Vitals and nursing note reviewed.  Constitutional:      General: She is not in acute distress.    Appearance: Normal appearance. She is well-developed and normal weight. She is ill-appearing. She is not toxic-appearing.  HENT:     Head: Normocephalic and atraumatic.  Eyes:     Extraocular Movements: Extraocular movements intact.     Conjunctiva/sclera: Conjunctivae normal.     Pupils: Pupils are equal, round, and reactive to light.  Cardiovascular:     Rate and Rhythm: Regular rhythm. Tachycardia present.     Pulses: Normal pulses.     Heart sounds: No murmur heard.   Pulmonary:     Effort: Pulmonary effort is normal. Tachypnea present. No respiratory distress.     Breath sounds: Normal breath sounds.  Abdominal:      General: There is no distension.     Palpations: Abdomen is soft.     Tenderness: There is no abdominal tenderness. There is no guarding.  Musculoskeletal:     Cervical back: Normal range of motion and neck supple. No rigidity.     Right lower leg: No edema.     Left lower leg: No edema.  Skin:    General: Skin is warm and dry.     Capillary Refill: Capillary refill takes less than 2 seconds.  Neurological:     General: No focal deficit present.     Mental Status: She is alert and oriented to person, place, and time. Mental status is at baseline.  Psychiatric:        Mood and Affect: Mood normal.        Behavior: Behavior normal.     ED Results / Procedures / Treatments   Labs (all labs ordered are listed, but only abnormal results are displayed) Labs Reviewed  SARS CORONAVIRUS 2 BY RT PCR ([facility]) - Abnormal; Notable for the following components:      Result Value   SARS Coronavirus 2 POSITIVE (*)    All other components within normal limits  BASIC METABOLIC PANEL - Abnormal; Notable for the following components:   Potassium 3.3 (*)    CO2 21 (*)    Glucose, Bld 127 (*)    All other components within normal limits  CBC - Abnormal; Notable for the following components:   RBC 5.79 (*)    MCV 69.9 (*)    MCH 22.3 (*)    All other components within normal limits    EKG None  Radiology DG Chest Portable 1 View  Result Date: 04/07/2020 CLINICAL DATA:  COVID positive patient. EXAM: PORTABLE CHEST 1 VIEW COMPARISON:  Single-view of the chest 09/18/2019. FINDINGS: Lungs clear. Heart size normal. No pneumothorax or pleural effusion. No acute or focal bony abnormality. IMPRESSION: Negative chest. Electronically Signed   By: Inge Rise M.D.   On: 04/07/2020 12:35    Procedures Procedures (including critical care time)  Medications Ordered in ED Medications  acetaminophen (TYLENOL) tablet 650 mg (650 mg Oral Given 04/07/20  1126)  lactated ringers bolus 2,000 mL (0 mLs Intravenous Stopped 04/07/20 1451)  ketorolac (TORADOL) 15 MG/ML injection 15 mg (15 mg Intravenous Given 04/07/20 1259)    ED Course   Amanda Garner is a 49 y.o. female with PMHx listed that presents to the Emergency Department complaint of Generalized Body Aches and Cough  ED Course: Initial exam completed.   Ill-appearing and hemodynamically stable.  Nontoxic and afebrile.  Physical exam significant for age-appropriate 49 year old female with no neurologic deficits, tachypnea, extremities well perfused, and no lower extremity edema.    Presentation is concerning for viral pneumonia/COVID-19 illness.  COVID-19 positive.  Given the lack of chest pain or significant hypoxia, doubt pulmonary embolism.  BMP with mild hypokalemia 3.3 but otherwise no acute electrolyte abnormality requiring urgent intervention.  CBC without evidence of leukocytosis or leukopenia and stable hemoglobin.  CXR without evidence of focal consolidation.  We will give 2 L LR bolus for fluid resuscitation.  Toradol for symptoms.  Patient ambulated by nursing staff with no evidence of hypoxia.  Recommend continued outpatient management with cough suppressant, Tylenol/Motrin, and drink plenty of fluids.  Return if needed.  Follow-up with PCP.  Follow quarantine instructions.  Diagnostics Vital Signs: reviewed Labs: reviewed and significant findings discussed above Imaging: personally reviewed images interpreted by radiology EKG: reviewed Records: nursing notes along with previous records reviewed and pertinent data discussed   Consults:  none   Reevaluation/Disposition:  Upon reevaluation, patients symptoms stable/improved. No active nausea/vomiting and ambulatory without assistance prior to discharge from the emergency department.    All questions answered.  Strict return precautions were discussed. Additionally we discussed establishing and/or following-up with primary  care physician.  Patient and/or family was understanding and in agreement with today's assessment and plan.   Sherolyn Buba, MD Emergency Medicine, PGY-3   Note: Dragon medical dictation software was used in the creation of this note.   Final Clinical Impression(s) / ED Diagnoses Final diagnoses:  COVID-19  Viral syndrome    Rx / DC Orders ED Discharge Orders    None       Frann Rider, MD 04/07/20 1900    Carmin Muskrat, MD 04/08/20 1131

## 2020-04-07 NOTE — ED Notes (Signed)
Patient's pulse ox 93-95% on RA while ambulating

## 2020-04-08 ENCOUNTER — Other Ambulatory Visit: Payer: Self-pay | Admitting: Adult Health

## 2020-04-08 DIAGNOSIS — U071 COVID-19: Secondary | ICD-10-CM

## 2020-04-08 NOTE — Progress Notes (Signed)
I connected by phone with Amanda Garner on 04/08/2020 at 2:41 PM to discuss the potential use of a new treatment for mild to moderate COVID-19 viral infection in non-hospitalized patients.  This patient is a 49 y.o. female that meets the FDA criteria for Emergency Use Authorization of COVID monoclonal antibody casirivimab/imdevimab.  Has a (+) direct SARS-CoV-2 viral test result  Has mild or moderate COVID-19   Is NOT hospitalized due to COVID-19  Is within 10 days of symptom onset  Has at least one of the high risk factor(s) for progression to severe COVID-19 and/or hospitalization as defined in EUA.  Specific high risk criteria : Immunosuppressive Disease or Treatment   I have spoken and communicated the following to the patient or parent/caregiver regarding COVID monoclonal antibody treatment:  1. FDA has authorized the emergency use for the treatment of mild to moderate COVID-19 in adults and pediatric patients with positive results of direct SARS-CoV-2 viral testing who are 71 years of age and older weighing at least 40 kg, and who are at high risk for progressing to severe COVID-19 and/or hospitalization.  2. The significant known and potential risks and benefits of COVID monoclonal antibody, and the extent to which such potential risks and benefits are unknown.  3. Information on available alternative treatments and the risks and benefits of those alternatives, including clinical trials.  4. Patients treated with COVID monoclonal antibody should continue to self-isolate and use infection control measures (e.g., wear mask, isolate, social distance, avoid sharing personal items, clean and disinfect "high touch" surfaces, and frequent handwashing) according to CDC guidelines.   5. The patient or parent/caregiver has the option to accept or refuse COVID monoclonal antibody treatment.  After reviewing this information with the patient, The patient agreed to proceed with receiving  casirivimab\imdevimab infusion and will be provided a copy of the Fact sheet prior to receiving the infusion. Scot Dock 04/08/2020 2:41 PM

## 2020-04-09 ENCOUNTER — Ambulatory Visit (HOSPITAL_COMMUNITY)
Admission: RE | Admit: 2020-04-09 | Discharge: 2020-04-09 | Disposition: A | Discharge status: Home / Self Care | Payer: HRSA Program | Source: Ambulatory Visit | Attending: Pulmonary Disease | Admitting: Pulmonary Disease

## 2020-04-09 DIAGNOSIS — U071 COVID-19: Secondary | ICD-10-CM | POA: Diagnosis present

## 2020-04-09 MED ORDER — SODIUM CHLORIDE 0.9 % IV SOLN: INTRAVENOUS | Status: DC | PRN

## 2020-04-09 MED ORDER — SODIUM CHLORIDE 0.9 % IV SOLN
1200.0000 mg | Freq: Once | INTRAVENOUS | Status: AC
  Administered 2020-04-09: 1200 mg via INTRAVENOUS
  Filled 2020-04-09: qty 10

## 2020-04-09 MED ORDER — EPINEPHRINE 0.3 MG/0.3ML IJ SOAJ: 0.3000 mg | Freq: Once | INTRAMUSCULAR | Status: DC | PRN

## 2020-04-09 MED ORDER — FAMOTIDINE IN NACL 20-0.9 MG/50ML-% IV SOLN: 20.0000 mg | Freq: Once | INTRAVENOUS | Status: DC | PRN

## 2020-04-09 MED ORDER — METHYLPREDNISOLONE SODIUM SUCC 125 MG IJ SOLR: 125.0000 mg | Freq: Once | INTRAMUSCULAR | Status: DC | PRN

## 2020-04-09 MED ORDER — DIPHENHYDRAMINE HCL 50 MG/ML IJ SOLN: 50.0000 mg | Freq: Once | INTRAMUSCULAR | Status: DC | PRN

## 2020-04-09 MED ORDER — ALBUTEROL SULFATE HFA 108 (90 BASE) MCG/ACT IN AERS: 2.0000 | INHALATION_SPRAY | Freq: Once | RESPIRATORY_TRACT | Status: DC | PRN

## 2020-04-09 NOTE — Progress Notes (Signed)
  Diagnosis: COVID-19  Physician:Dr Joya Gaskins  Procedure: [facility] Infusion Clinic Med: casirivimab\imdevimab infusion - Provided patient with casirivimab\imdevimab fact sheet for patients, parents and caregivers prior to infusion.  Complications: No immediate complications noted.  Discharge: Discharged home   Scotty Court 04/09/2020

## 2020-04-09 NOTE — Discharge Instructions (Signed)

## 2020-12-11 ENCOUNTER — Other Ambulatory Visit: Payer: Self-pay

## 2020-12-11 ENCOUNTER — Ambulatory Visit (HOSPITAL_COMMUNITY)
Admission: EM | Admit: 2020-12-11 | Discharge: 2020-12-11 | Disposition: A | Discharge status: Home / Self Care | Payer: 59 | Attending: Emergency Medicine | Admitting: Emergency Medicine

## 2020-12-11 ENCOUNTER — Encounter (HOSPITAL_COMMUNITY): Payer: Self-pay

## 2020-12-11 DIAGNOSIS — I10 Essential (primary) hypertension: Secondary | ICD-10-CM | POA: Diagnosis present

## 2020-12-11 DIAGNOSIS — B353 Tinea pedis: Secondary | ICD-10-CM | POA: Diagnosis present

## 2020-12-11 LAB — COMPREHENSIVE METABOLIC PANEL
ALT: 17 U/L (ref 0–44)
AST: 15 U/L (ref 15–41)
Albumin: 3.7 g/dL (ref 3.5–5.0)
Alkaline Phosphatase: 61 U/L (ref 38–126)
Anion gap: 8 (ref 5–15)
BUN: 14 mg/dL (ref 6–20)
CO2: 26 mmol/L (ref 22–32)
Calcium: 9.8 mg/dL (ref 8.9–10.3)
Chloride: 106 mmol/L (ref 98–111)
Creatinine, Ser: 0.7 mg/dL (ref 0.44–1.00)
GFR, Estimated: >60 mL/min (ref >60)
Glucose, Bld: 135 mg/dL — ABNORMAL HIGH (ref 70–99)
Potassium: 3.8 mmol/L (ref 3.5–5.1)
Sodium: 140 mmol/L (ref 135–145)
Total Bilirubin: 0.3 mg/dL (ref 0.3–1.2)
Total Protein: 7.2 g/dL (ref 6.5–8.1)

## 2020-12-11 MED ORDER — TERBINAFINE HCL 1 % EX CREA: 1.0000 "application " | TOPICAL_CREAM | Freq: Two times a day (BID) | CUTANEOUS | 0 refills | Status: DC

## 2020-12-11 MED ORDER — FLUCONAZOLE 150 MG PO TABS: 150.0000 mg | ORAL_TABLET | ORAL | 0 refills | Status: AC

## 2020-12-11 MED ORDER — AMLODIPINE BESYLATE 5 MG PO TABS: 5.0000 mg | ORAL_TABLET | Freq: Every day | ORAL | 1 refills | Status: DC

## 2020-12-11 NOTE — ED Triage Notes (Addendum)
Patient presents to Urgent Care with complaints of bilateral foot wounds, rash x 5 years. She states she has seen podiatrist last year and was prescribed an antibx and foot cream with no relief. She states she is here for further evaluation since fungal rash has spread to right hand/scalp and pain. She also request BP refill. She is on Norvasc and unable to get refilled as she does not have a PCP. She reports last time she took BP med was last year. Treating pain with tylenol.   Denies fever.

## 2020-12-11 NOTE — Discharge Instructions (Addendum)
Use lamisil cream twice a day on affected areas  Take one diflucan pill once a day for the next 12 weeks  Take norvasc pill once daily, take blood pressure daily and track for primary provider  Referral placed for dermatology  Referral placed for primary care  If you have not heard from primary assistance, you can try to schedule an appointment for primary care at local health department

## 2020-12-11 NOTE — ED Provider Notes (Signed)
[facility]    CSN: [id number] Arrival date & time: 12/11/20  4098      History   Chief Complaint Chief Complaint  Patient presents with  . Foot Pain    HPI Amanda Garner is a 50 y.o. female.   Patient presents wounds on bilateral feet present for 4 years, has spread to right thumb and hairline. Area is itching, has cracking and making it painful to walk. Has been seen by podiatrist, in urgent cares and emergency department several times for same complaint. Difficulty affording podiatrist visits. Denies fever and chills.   Requesting BP medication refill. Has not taken medication in a year. Does not have PCP. Denies headache, shortness of breath, visual changes, nausea.   Past Medical History:  Diagnosis Date  . Acid reflux   . Anemia Dx: 2001 or so   . Blood transfusion   . Crohn disease ([place])   . Crohn's disease ([place])   . Diverticulitis   . Hypertension   . Sickle cell trait [facility]     Patient Active Problem List   Diagnosis Date Noted  . Hyperglycemia 02/01/2020  . Crohn's disease of large intestine with abscess ([place]) 01/31/2020  . Diverticulitis of large intestine with abscess without bleeding 06/07/2017  . Abscess of sigmoid colon due to diverticulitis 05/24/2017  . Tobacco dependence 05/11/2017  . Essential hypertension 05/11/2017  . Reflux esophagitis 05/11/2017  . Tinea pedis of left foot 04/11/2015  . C. difficile colitis 08/26/2011    Past Surgical History:  Procedure Laterality Date  . ABDOMINAL HYSTERECTOMY  2001   partial hysterectomy - emergency during last C-section  . [place]; 1997; 2001  . CESAREAN SECTION      OB History   No obstetric history on file.      Home Medications    Prior to Admission medications   Medication Sig Start Date End Date Taking? Authorizing Provider  fluconazole (DIFLUCAN) 150 MG tablet Take 1 tablet (150 mg total) by mouth once a week for 12 doses. 12/11/20 02/27/21 Yes Ayah Cozzolino  R, NP  terbinafine (LAMISIL) 1 % cream Apply 1 application topically 2 (two) times daily. 12/11/20  Yes Ahyana Skillin R, NP  amLODipine (NORVASC) 5 MG tablet Take 1 tablet (5 mg total) by mouth daily. 12/11/20 02/09/21  Hans Eden, NP  ciprofloxacin (CIPRO) 500 MG tablet Take 1 tablet (500 mg total) by mouth 2 (two) times daily. 01/31/20   Elsie Stain, MD  metroNIDAZOLE (FLAGYL) 500 MG tablet Take 1 tablet (500 mg total) by mouth 3 (three) times daily. 01/31/20   Elsie Stain, MD  senna-docusate (SENOKOT-S) 8.6-50 MG tablet Take 2 tablets by mouth 2 (two) times daily. 01/31/20   Elsie Stain, MD    Family History Family History  Problem Relation Age of Onset  . Hypertension Mother   . Diabetes Mother   . Diabetes type II Mother   . Diabetes type II Father   . Stroke Father   . Lung cancer Father 59  . Colon cancer Neg Hx     Social History Social History   Tobacco Use  . Smoking status: Current Every Day Smoker    Packs/day: 0.50    Years: 31.00    Pack years: 15.50    Types: Cigarettes  . Smokeless tobacco: Never Used  Vaping Use  . Vaping Use: Never used  Substance Use Topics  . Alcohol use: Yes    Comment: occassionally  .  Drug use: Never     Allergies   Patient has no known allergies.   Review of Systems Review of Systems  Constitutional: Negative.   HENT: Negative.   Respiratory: Negative.   Cardiovascular: Negative.   Gastrointestinal: Negative.   Skin: Positive for rash and wound. Negative for color change and pallor.  Neurological: Negative.      Physical Exam Triage Vital Signs ED Triage Vitals  Enc Vitals Group     BP 12/11/20 0913 (S) (!) 156/106     Pulse Rate 12/11/20 0913 91     Resp 12/11/20 0913 16     Temp 12/11/20 0913 98.5 F (36.9 C)     Temp Source 12/11/20 0913 Oral     SpO2 12/11/20 0913 98 %     Weight --      Height --      Head Circumference --      Peak Flow --      Pain Score 12/11/20 0911 10      Pain Loc --      Pain Edu? --      Excl. in [place]? --    No data found.  Updated Vital Signs BP (S) (!) 156/106 (BP Location: Left Arm)   Pulse 91   Temp 98.5 F (36.9 C) (Oral)   Resp 16   LMP 08/20/2011   SpO2 98%   Visual Acuity Right Eye Distance:   Left Eye Distance:   Bilateral Distance:    Right Eye Near:   Left Eye Near:    Bilateral Near:     Physical Exam Constitutional:      Appearance: Normal appearance. She is normal weight.  HENT:     Head: Normocephalic.  Eyes:     Extraocular Movements: Extraocular movements intact.  Cardiovascular:     Rate and Rhythm: Normal rate and regular rhythm.     Pulses: Normal pulses.     Heart sounds: Normal heart sounds.  Pulmonary:     Effort: Pulmonary effort is normal.     Breath sounds: Normal breath sounds.  Musculoskeletal:        General: Normal range of motion.       Feet:  Feet:     Right foot:     Toenail Condition: Fungal disease present.    Left foot:     Skin integrity: Fissure present.     Toenail Condition: Fungal disease present.    Comments: Unable to visualize toenails due to polish,  Skin:    Comments: Fungus infection present on right thumb, area present on hairline above right ear and small area present towards top of hairline   Neurological:     General: No focal deficit present.     Mental Status: She is alert and oriented to person, place, and time. Mental status is at baseline.  Psychiatric:        Mood and Affect: Mood normal.        Behavior: Behavior normal.        Thought Content: Thought content normal.        Judgment: Judgment normal.      UC Treatments / Results  Labs (all labs ordered are listed, but only abnormal results are displayed) Labs Reviewed  COMPREHENSIVE METABOLIC PANEL    EKG   Radiology No results found.  Procedures Procedures (including critical care time)  Medications Ordered in UC Medications - No data to display  Initial Impression / Assessment  and Plan /  UC Course  I have reviewed the triage vital signs and the nursing notes.  Pertinent labs & imaging results that were available during my care of the patient were reviewed by me and considered in my medical decision making (see chart for details).  Tinea pedis of both feet Elevated BP with diagnosis HTN  1. Diflucan 150 mg weekly for 12 weeks 2. lamisil cream bid  3. Referral to dermatology placed 4. Norvasc 5 mg daily, track BP daily until PCP appointment  5. CMP- pending 6. PCP referral placed, go to health department if PCP appointment will be over 1 month from now  Final Clinical Impressions(s) / UC Diagnoses   Final diagnoses:  Tinea pedis of both feet  Elevated blood pressure reading in office with diagnosis of hypertension     Discharge Instructions     Use lamisil cream twice a day on affected areas  Take one diflucan pill once a day for the next 12 weeks  Take norvasc pill once daily, take blood pressure daily and track for primary provider  Referral placed for dermatology  Referral placed for primary care  If you have not heard from primary assistance, you can try to schedule an appointment for primary care at local health department    ED Prescriptions    Medication Sig Dispense Auth. Provider   amLODipine (NORVASC) 5 MG tablet Take 1 tablet (5 mg total) by mouth daily. 30 tablet Khup Sapia R, NP   fluconazole (DIFLUCAN) 150 MG tablet Take 1 tablet (150 mg total) by mouth once a week for 12 doses. 12 tablet Orestes Geiman R, NP   terbinafine (LAMISIL) 1 % cream Apply 1 application topically 2 (two) times daily. 30 g Hans Eden, NP     PDMP not reviewed this encounter.   Hans Eden, NP 12/11/20 1002

## 2020-12-15 ENCOUNTER — Encounter: Payer: Self-pay | Admitting: *Deleted

## 2020-12-23 ENCOUNTER — Encounter (HOSPITAL_COMMUNITY): Payer: Self-pay | Admitting: Emergency Medicine

## 2020-12-23 ENCOUNTER — Emergency Department (HOSPITAL_COMMUNITY)
Admission: EM | Admit: 2020-12-23 | Discharge: 2020-12-23 | Disposition: A | Discharge status: Home / Self Care | Payer: 59 | Attending: Emergency Medicine | Admitting: Emergency Medicine

## 2020-12-23 ENCOUNTER — Other Ambulatory Visit: Payer: Self-pay

## 2020-12-23 DIAGNOSIS — K0889 Other specified disorders of teeth and supporting structures: Secondary | ICD-10-CM | POA: Insufficient documentation

## 2020-12-23 DIAGNOSIS — F17210 Nicotine dependence, cigarettes, uncomplicated: Secondary | ICD-10-CM | POA: Diagnosis not present

## 2020-12-23 DIAGNOSIS — Z79899 Other long term (current) drug therapy: Secondary | ICD-10-CM | POA: Insufficient documentation

## 2020-12-23 DIAGNOSIS — I10 Essential (primary) hypertension: Secondary | ICD-10-CM | POA: Insufficient documentation

## 2020-12-23 MED ORDER — IBUPROFEN 800 MG PO TABS
800.0000 mg | ORAL_TABLET | Freq: Three times a day (TID) | ORAL | 0 refills | Status: DC
Start: 2020-12-23 — End: 2021-07-01

## 2020-12-23 MED ORDER — CLINDAMYCIN HCL 150 MG PO CAPS
150.0000 mg | ORAL_CAPSULE | Freq: Four times a day (QID) | ORAL | 0 refills | Status: DC
Start: 2020-12-23 — End: 2021-07-01

## 2020-12-23 NOTE — ED Provider Notes (Signed)
[facility] DEPT Provider Note   CSN: [id number] Arrival date & time: 12/23/20  1011     History Chief Complaint  Patient presents with  . Dental Pain    Amanda Garner is a 50 y.o. female.  50 year old female presents with right-sided facial swelling times several days.  Has noted some pain to her right upper tooth.  No fever or chills.  Has been using amoxicillin and ibuprofen without relief.  Denies any trouble swallowing        Past Medical History:  Diagnosis Date  . Acid reflux   . Anemia Dx: 2001 or so   . Blood transfusion   . Crohn disease ([place])   . Crohn's disease ([place])   . Diverticulitis   . Hypertension   . Sickle cell trait [facility])     Patient Active Problem List   Diagnosis Date Noted  . Hyperglycemia 02/01/2020  . Crohn's disease of large intestine with abscess ([place]) 01/31/2020  . Diverticulitis of large intestine with abscess without bleeding 06/07/2017  . Abscess of sigmoid colon due to diverticulitis 05/24/2017  . Tobacco dependence 05/11/2017  . Essential hypertension 05/11/2017  . Reflux esophagitis 05/11/2017  . Tinea pedis of left foot 04/11/2015  . C. difficile colitis 08/26/2011    Past Surgical History:  Procedure Laterality Date  . ABDOMINAL HYSTERECTOMY  2001   partial hysterectomy - emergency during last C-section  . [place]; 1997; 2001  . CESAREAN SECTION       OB History   No obstetric history on file.     Family History  Problem Relation Age of Onset  . Hypertension Mother   . Diabetes Mother   . Diabetes type II Mother   . Diabetes type II Father   . Stroke Father   . Lung cancer Father 60  . Colon cancer Neg Hx     Social History   Tobacco Use  . Smoking status: Current Every Day Smoker    Packs/day: 0.50    Years: 31.00    Pack years: 15.50    Types: Cigarettes  . Smokeless tobacco: Never Used  Vaping Use  . Vaping Use: Never used  Substance Use Topics  .  Alcohol use: Yes    Comment: occassionally  . Drug use: Never    Home Medications Prior to Admission medications   Medication Sig Start Date End Date Taking? Authorizing Provider  amLODipine (NORVASC) 5 MG tablet Take 1 tablet (5 mg total) by mouth daily. 12/11/20 02/09/21  Hans Eden, NP  ciprofloxacin (CIPRO) 500 MG tablet Take 1 tablet (500 mg total) by mouth 2 (two) times daily. 01/31/20   Elsie Stain, MD  fluconazole (DIFLUCAN) 150 MG tablet Take 1 tablet (150 mg total) by mouth once a week for 12 doses. 12/11/20 02/27/21  Hans Eden, NP  metroNIDAZOLE (FLAGYL) 500 MG tablet Take 1 tablet (500 mg total) by mouth 3 (three) times daily. 01/31/20   Elsie Stain, MD  senna-docusate (SENOKOT-S) 8.6-50 MG tablet Take 2 tablets by mouth 2 (two) times daily. 01/31/20   Elsie Stain, MD  terbinafine (LAMISIL) 1 % cream Apply 1 application topically 2 (two) times daily. 12/11/20   Hans Eden, NP    Allergies    Patient has no known allergies.  Review of Systems   Review of Systems  All other systems reviewed and are negative.   Physical Exam Updated Vital Signs BP (!) 147/106 (BP Location:  Left Arm)   Pulse 83   Temp 98.5 F (36.9 C) (Oral)   Resp (!) 22   Ht 1.676 m (5' 6" )   Wt 93.9 kg   LMP 08/20/2011   SpO2 99%   BMI 33.41 kg/m   Physical Exam Vitals and nursing note reviewed.  Constitutional:      General: She is not in acute distress.    Appearance: Normal appearance. She is well-developed. She is not toxic-appearing.  HENT:     Head: Normocephalic and atraumatic.      Mouth/Throat:     Comments: No identifiable abscess noted. Eyes:     General: Lids are normal.     Conjunctiva/sclera: Conjunctivae normal.     Pupils: Pupils are equal, round, and reactive to light.  Neck:     Thyroid: No thyroid mass.     Trachea: No tracheal deviation.  Cardiovascular:     Rate and Rhythm: Normal rate and regular rhythm.     Heart sounds: Normal  heart sounds. No murmur heard. No gallop.   Pulmonary:     Effort: Pulmonary effort is normal. No respiratory distress.     Breath sounds: Normal breath sounds. No stridor. No decreased breath sounds, wheezing, rhonchi or rales.  Abdominal:     General: Bowel sounds are normal. There is no distension.     Palpations: Abdomen is soft.     Tenderness: There is no abdominal tenderness. There is no rebound.  Musculoskeletal:        General: No tenderness. Normal range of motion.     Cervical back: Normal range of motion and neck supple.  Skin:    General: Skin is warm and dry.     Findings: No abrasion or rash.  Neurological:     Mental Status: She is alert and oriented to person, place, and time.     GCS: GCS eye subscore is 4. GCS verbal subscore is 5. GCS motor subscore is 6.     Cranial Nerves: No cranial nerve deficit.     Sensory: No sensory deficit.  Psychiatric:        Speech: Speech normal.        Behavior: Behavior normal.     ED Results / Procedures / Treatments   Labs (all labs ordered are listed, but only abnormal results are displayed) Labs Reviewed - No data to display  EKG None  Radiology No results found.  Procedures Procedures   Medications Ordered in ED Medications - No data to display  ED Course  I have reviewed the triage vital signs and the nursing notes.  Pertinent labs & imaging results that were available during my care of the patient were reviewed by me and considered in my medical decision making (see chart for details).    MDM Rules/Calculators/A&P                          Will place patient on clindamycin and have instructed her to follow-up with her doctor.  Airway stable Final Clinical Impression(s) / ED Diagnoses Final diagnoses:  None    Rx / DC Orders ED Discharge Orders    None       Lacretia Leigh, MD 12/23/20 1032

## 2020-12-23 NOTE — ED Triage Notes (Signed)
Patient reports having a tooth problem x 3 days and started taking amoxicillin that she had at home.  Patient now presents with right facial swelling and tooth pain

## 2021-01-22 ENCOUNTER — Ambulatory Visit (INDEPENDENT_AMBULATORY_CARE_PROVIDER_SITE_OTHER): Payer: 59 | Admitting: Family Medicine

## 2021-01-22 ENCOUNTER — Other Ambulatory Visit: Payer: Self-pay

## 2021-01-22 VITALS — BP 124/85 | HR 87 | Ht 66.0 in | Wt 214.2 lb

## 2021-01-22 DIAGNOSIS — L851 Acquired keratosis [keratoderma] palmaris et plantaris: Secondary | ICD-10-CM

## 2021-01-22 DIAGNOSIS — R234 Changes in skin texture: Secondary | ICD-10-CM

## 2021-01-22 MED ORDER — SALICYLIC ACID 6 % EX CREA: TOPICAL_CREAM | CUTANEOUS | 0 refills | Status: DC

## 2021-01-22 NOTE — Patient Instructions (Signed)
It was wonderful to meet you today. Thank you for allowing me to be a part of your care. Below is a short summary of what we discussed at your visit today:  Skin scaling - complete your current medicine course - use the salicylic acid cream as prescribed, twice daily - do not go to the nail salon to have the excess skin removed before coming back - return for follow up dermatology appointment on Thursday, July 7 - at that time, we may get a sample   Please bring all of your medications to every appointment!  If you have any questions or concerns, please do not hesitate to contact us via phone or MyChart message.   Ezequiel Essex, MD

## 2021-01-22 NOTE — Progress Notes (Signed)
    SUBJECTIVE:   CHIEF COMPLAINT / HPI:   Bilateral plantar thickening  - five years progressively worsening bilateral plantar skin thickening - pruritic and black in color - now has pruritic flaky rash on right thumb, left fifth finger, and scalp - husband with round patchy pruritic patches of hair loss since January - patient routinely has thickened skin removed at nail salon - has "tried everything" - endorses years of topical creams, sprays, prescription anti-fungals - no aggravating or relieving factors  PERTINENT  PMH / PSH: tinea pedis, HTN, reflux esophagitis, Crohn's disease, hx sigmoid abscess due to diverticulitis, tobacco dependence  OBJECTIVE:   BP 124/85   Pulse 87   Ht 5' 6"  (1.676 m)   Wt 214 lb 3.2 oz (97.2 kg)   LMP 08/20/2011   SpO2 94%   BMI 34.57 kg/m    Skin: hyperkeratotic plantar surfaces bilaterally, mild flaky plaques overlying right thumb and scalp          ASSESSMENT/PLAN:   Acquired palmar and plantar hyperkeratosis 5 year history. Per patient has failed numerous topical and oral treatments. Unfortunately unable to fully assess and biopsy today as she just went to nail salon yesterday and had feet filed down. Will prescribe salicylic acid cream for keratin debridement. Plan to follow up in one month.    Ezequiel Essex, MD [place]

## 2021-01-25 DIAGNOSIS — L851 Acquired keratosis [keratoderma] palmaris et plantaris: Secondary | ICD-10-CM | POA: Insufficient documentation

## 2021-01-25 NOTE — Assessment & Plan Note (Signed)
5 year history. Per patient has failed numerous topical and oral treatments. Unfortunately unable to fully assess and biopsy today as she just went to nail salon yesterday and had feet filed down. Will prescribe salicylic acid cream for keratin debridement. Plan to follow up in one month.

## 2021-02-11 ENCOUNTER — Emergency Department (HOSPITAL_COMMUNITY)
Admission: EM | Admit: 2021-02-11 | Discharge: 2021-02-11 | Discharge status: Against Medical Advice | Payer: 59 | Attending: Emergency Medicine | Admitting: Emergency Medicine

## 2021-02-11 ENCOUNTER — Other Ambulatory Visit: Payer: Self-pay

## 2021-02-11 ENCOUNTER — Inpatient Hospital Stay: Payer: 59 | Admitting: Critical Care Medicine

## 2021-02-11 ENCOUNTER — Ambulatory Visit: Payer: Self-pay

## 2021-02-11 ENCOUNTER — Emergency Department (HOSPITAL_COMMUNITY): Payer: 59

## 2021-02-11 ENCOUNTER — Encounter (HOSPITAL_COMMUNITY): Payer: Self-pay | Admitting: *Deleted

## 2021-02-11 DIAGNOSIS — Z5321 Procedure and treatment not carried out due to patient leaving prior to being seen by health care provider: Secondary | ICD-10-CM | POA: Diagnosis not present

## 2021-02-11 DIAGNOSIS — R079 Chest pain, unspecified: Secondary | ICD-10-CM | POA: Diagnosis present

## 2021-02-11 LAB — CBC WITH DIFFERENTIAL/PLATELET
Abs Immature Granulocytes: 0.04 10*3/uL (ref 0.00–0.07)
Basophils Absolute: 0 10*3/uL (ref 0.0–0.1)
Basophils Relative: 1 %
Eosinophils Absolute: 0.3 10*3/uL (ref 0.0–0.5)
Eosinophils Relative: 4 %
HCT: 38.5 % (ref 36.0–46.0)
Hemoglobin: 12.4 g/dL (ref 12.0–15.0)
Immature Granulocytes: 1 %
Lymphocytes Relative: 34 %
Lymphs Abs: 3 10*3/uL (ref 0.7–4.0)
MCH: 21.9 pg — ABNORMAL LOW (ref 26.0–34.0)
MCHC: 32.2 g/dL (ref 30.0–36.0)
MCV: 68.1 fL — ABNORMAL LOW (ref 80.0–100.0)
Monocytes Absolute: 0.7 10*3/uL (ref 0.1–1.0)
Monocytes Relative: 8 %
Neutro Abs: 4.6 10*3/uL (ref 1.7–7.7)
Neutrophils Relative %: 52 %
Platelets: 279 10*3/uL (ref 150–400)
RBC: 5.65 MIL/uL — ABNORMAL HIGH (ref 3.87–5.11)
RDW: 15.5 % (ref 11.5–15.5)
WBC: 8.7 10*3/uL (ref 4.0–10.5)
nRBC: 0 % (ref 0.0–0.2)

## 2021-02-11 LAB — COMPREHENSIVE METABOLIC PANEL
ALT: 23 U/L (ref 0–44)
AST: 23 U/L (ref 15–41)
Albumin: 4 g/dL (ref 3.5–5.0)
Alkaline Phosphatase: 69 U/L (ref 38–126)
Anion gap: 5 (ref 5–15)
BUN: 13 mg/dL (ref 6–20)
CO2: 23 mmol/L (ref 22–32)
Calcium: 8.9 mg/dL (ref 8.9–10.3)
Chloride: 109 mmol/L (ref 98–111)
Creatinine, Ser: 0.69 mg/dL (ref 0.44–1.00)
GFR, Estimated: >60 mL/min (ref >60)
Glucose, Bld: 146 mg/dL — ABNORMAL HIGH (ref 70–99)
Potassium: 3.5 mmol/L (ref 3.5–5.1)
Sodium: 137 mmol/L (ref 135–145)
Total Bilirubin: 0.3 mg/dL (ref 0.3–1.2)
Total Protein: 7.2 g/dL (ref 6.5–8.1)

## 2021-02-11 LAB — TROPONIN I (HIGH SENSITIVITY): Troponin I (High Sensitivity): 2 ng/L (ref <18)

## 2021-02-11 NOTE — ED Provider Notes (Signed)
Emergency Medicine Provider Triage Evaluation Note  Amanda Garner 50 y.o. female was evaluated in triage.  Pt complains of chest pain that began last night.  She reports right-sided chest pain.  She feels like it wraps around to her shoulder into her back.  Describes a stabbing pain.  No associated nausea, vomiting, diaphoresis.  She states it hurts more when she takes a deep breath then.  No cough, fevers.  No abdominal pain, nausea/vomiting.  She does not smoke.  No history of hypertension.  Is unsure of diabetes.   Review of Systems  Positive: Chest pain Negative: Nausea/vomiting, abdominal pain.  Physical Exam  BP 134/82   Pulse 70   Temp 98.2 F (36.8 C) (Oral)   Resp 18   Ht 5' 4"  (1.626 m)   Wt 65.8 kg   SpO2 100%   BMI 24.89 kg/m  Gen:   Awake, no distress  HEENT:  Atraumatic  Resp:  Normal effort. Lungs CTAB. Equal breath sounds throuhgout.  Cardiac:  Normal rate. 2+ radial pulses.  Abd:   Nondistended, nontender MSK:   Moves extremities without difficulty  Neuro:  Speech clear   Other:      Medical Decision Making  Medically screening exam initiated at 1:46 PM  Appropriate orders placed.  Neddie Steedman was informed that the remainder of the evaluation will be completed by another provider, this initial triage assessment does not replace that evaluation. They are counseled that they will need to remain in the ED until the completion of their workup, including full H&P and results of any tests.  Risks of leaving the emergency department prior to completion of treatment were discussed. Patient was advised to inform ED staff if they are leaving before their treatment is complete. The patient acknowledged these risks and time was allowed for questions.     The patient appears stable so that the remainder of the MSE may be completed by another provider.   Clinical Impression  CP   Portions of this note were generated with Dragon dictation software. Dictation errors may  occur despite best attempts at proofreading.     Volanda Napoleon, PA-C 02/11/21 1348    Truddie Hidden, MD 02/11/21 [phone]

## 2021-02-11 NOTE — Telephone Encounter (Signed)
Patient's husband called and says she missed her 0930 appointment because she fell asleep after working night shift and they both woke up at 0940. I placed him on hold, called the office and spoke to [place], [place]. She says Dr. Joya Gaskins is full and is only there in the morning today, so if she could come tomorrow, she can schedule with Freeman Caldron, PA-C at [phone]. I advised the husband and he agreed to the appointment. Initially when the patient's husband called, he mentioned she has wheezing and chest pain, but before the call could be transferred to NT he hung up. I called him back and discussed the above. Then I asked about the wheezing and chest pain, is that the reason he called as well. He says yes it is and that she needs to be seen for that as well, so that's whey they were hoping to see the doctor today. I advised I will need to speak to the patient. She says the wheezing has been ongoing for weeks, but the chest pain started yesterday with her SOB. She says she's SOB constantly and SOB talking to me, SOB walking around at work. She says she has to stop and take a break. Her husband says she can't walk up stairs due to the SOB. She says her chest hurst when breathing in that it feels/looks like her chest is caving in when she's breathing because of the SOB and wheezing. She says she's been using her sister's inhaler at times. She says she has not been to any doctor about the wheezing. She denies any other symptoms, no fever, no known COVID exposures, has not taken a COVID test recently. I advised with the CP, SOB she will need ED evaluation today. She asked about the appointment tomorrow, I advised I will have someone from the office to call about the scheduled appointment, but for the time being to go to the ED, patient verbalized understanding.   Reason for Disposition  [1] MODERATE difficulty breathing (e.g., speaks in phrases, SOB even at rest, pulse 100-120) AND [2] NEW-onset or WORSE than  normal  Answer Assessment - Initial Assessment Questions 1. RESPIRATORY STATUS: "Describe your breathing?" (e.g., wheezing, shortness of breath, unable to speak, severe coughing)      Wheezing, shortness of breath 2. ONSET: "When did this breathing problem begin?"      SOB yesterday, wheezing for a couple of months 3. PATTERN "Does the difficult breathing come and go, or has it been constant since it started?"      Constant 4. SEVERITY: "How bad is your breathing?" (e.g., mild, moderate, severe)    - MILD: No SOB at rest, mild SOB with walking, speaks normally in sentences, can lie down, no retractions, pulse < 100.    - MODERATE: SOB at rest, SOB with minimal exertion and prefers to sit, cannot lie down flat, speaks in phrases, mild retractions, audible wheezing, pulse 100-120.    - SEVERE: Very SOB at rest, speaks in single words, struggling to breathe, sitting hunched forward, retractions, pulse > 120      Moderate 5. RECURRENT SYMPTOM: "Have you had difficulty breathing before?" If Yes, ask: "When was the last time?" and "What happened that time?"      No 6. CARDIAC HISTORY: "Do you have any history of heart disease?" (e.g., heart attack, angina, bypass surgery, angioplasty)      No 7. LUNG HISTORY: "Do you have any history of lung disease?"  (e.g., pulmonary embolus, asthma, emphysema)  Asthma as child 8. CAUSE: "What do you think is causing the breathing problem?"      I don't know 9. OTHER SYMPTOMS: "Do you have any other symptoms? (e.g., dizziness, runny nose, cough, chest pain, fever)     Chest pain 10. O2 SATURATION MONITOR:  "Do you use an oxygen saturation monitor (pulse oximeter) at home?" If Yes, "What is your reading (oxygen level) today?" "What is your usual oxygen saturation reading?" (e.g., 95%)       No 11. PREGNANCY: "Is there any chance you are pregnant?" "When was your last menstrual period?"       No 12. TRAVEL: "Have you traveled out of the country in the last  month?" (e.g., travel history, exposures)       No  Protocols used: Breathing Difficulty-A-AH

## 2021-02-11 NOTE — ED Triage Notes (Signed)
Pt complains of chest pain radiating to right back. No n/v. Pain is worse with deep breath.

## 2021-02-12 ENCOUNTER — Inpatient Hospital Stay: Payer: 59 | Admitting: Physician Assistant

## 2021-02-26 ENCOUNTER — Ambulatory Visit: Payer: 59

## 2021-03-31 ENCOUNTER — Emergency Department (HOSPITAL_COMMUNITY)
Admission: EM | Admit: 2021-03-31 | Discharge: 2021-04-01 | Disposition: A | Discharge status: Home / Self Care | Payer: Self-pay | Attending: Emergency Medicine | Admitting: Emergency Medicine

## 2021-03-31 ENCOUNTER — Encounter (HOSPITAL_COMMUNITY): Payer: Self-pay

## 2021-03-31 ENCOUNTER — Other Ambulatory Visit: Payer: Self-pay

## 2021-03-31 DIAGNOSIS — N3000 Acute cystitis without hematuria: Secondary | ICD-10-CM

## 2021-03-31 DIAGNOSIS — I10 Essential (primary) hypertension: Secondary | ICD-10-CM | POA: Insufficient documentation

## 2021-03-31 DIAGNOSIS — N824 Other female intestinal-genital tract fistulae: Secondary | ICD-10-CM

## 2021-03-31 DIAGNOSIS — Z79899 Other long term (current) drug therapy: Secondary | ICD-10-CM | POA: Insufficient documentation

## 2021-03-31 DIAGNOSIS — R1032 Left lower quadrant pain: Secondary | ICD-10-CM | POA: Insufficient documentation

## 2021-03-31 DIAGNOSIS — F17210 Nicotine dependence, cigarettes, uncomplicated: Secondary | ICD-10-CM | POA: Insufficient documentation

## 2021-03-31 LAB — URINALYSIS, ROUTINE W REFLEX MICROSCOPIC
Bilirubin Urine: NEGATIVE
Glucose, UA: NEGATIVE mg/dL
Ketones, ur: NEGATIVE mg/dL
Nitrite: NEGATIVE
Protein, ur: 30 mg/dL — AB
RBC / HPF: >50 RBC/hpf — ABNORMAL HIGH (ref 0–5)
Specific Gravity, Urine: 1.027 (ref 1.005–1.030)
WBC, UA: >50 WBC/hpf — ABNORMAL HIGH (ref 0–5)
pH: 5 (ref 5.0–8.0)

## 2021-03-31 LAB — CBC WITH DIFFERENTIAL/PLATELET
Abs Immature Granulocytes: 0.06 10*3/uL (ref 0.00–0.07)
Basophils Absolute: 0 10*3/uL (ref 0.0–0.1)
Basophils Relative: 0 %
Eosinophils Absolute: 0.3 10*3/uL (ref 0.0–0.5)
Eosinophils Relative: 3 %
HCT: 41.1 % (ref 36.0–46.0)
Hemoglobin: 13 g/dL (ref 12.0–15.0)
Immature Granulocytes: 1 %
Lymphocytes Relative: 40 %
Lymphs Abs: 3.6 10*3/uL (ref 0.7–4.0)
MCH: 21.8 pg — ABNORMAL LOW (ref 26.0–34.0)
MCHC: 31.6 g/dL (ref 30.0–36.0)
MCV: 69 fL — ABNORMAL LOW (ref 80.0–100.0)
Monocytes Absolute: 0.6 10*3/uL (ref 0.1–1.0)
Monocytes Relative: 7 %
Neutro Abs: 4.4 10*3/uL (ref 1.7–7.7)
Neutrophils Relative %: 49 %
Platelets: 288 10*3/uL (ref 150–400)
RBC: 5.96 MIL/uL — ABNORMAL HIGH (ref 3.87–5.11)
RDW: 14.8 % (ref 11.5–15.5)
WBC: 9 10*3/uL (ref 4.0–10.5)
nRBC: 0 % (ref 0.0–0.2)

## 2021-03-31 LAB — COMPREHENSIVE METABOLIC PANEL
ALT: 15 U/L (ref 0–44)
AST: 19 U/L (ref 15–41)
Albumin: 3.7 g/dL (ref 3.5–5.0)
Alkaline Phosphatase: 65 U/L (ref 38–126)
Anion gap: 10 (ref 5–15)
BUN: 12 mg/dL (ref 6–20)
CO2: 23 mmol/L (ref 22–32)
Calcium: 9.3 mg/dL (ref 8.9–10.3)
Chloride: 104 mmol/L (ref 98–111)
Creatinine, Ser: 0.6 mg/dL (ref 0.44–1.00)
GFR, Estimated: >60 mL/min (ref >60)
Glucose, Bld: 127 mg/dL — ABNORMAL HIGH (ref 70–99)
Potassium: 3.6 mmol/L (ref 3.5–5.1)
Sodium: 137 mmol/L (ref 135–145)
Total Bilirubin: 0.2 mg/dL — ABNORMAL LOW (ref 0.3–1.2)
Total Protein: 7.2 g/dL (ref 6.5–8.1)

## 2021-03-31 LAB — LIPASE, BLOOD: Lipase: 36 U/L (ref 11–51)

## 2021-03-31 NOTE — ED Triage Notes (Signed)
Pt endorses LLQ abdominal pain for the last two weeks has hx of diverticulitis and reports she was told she has fistula that was unable to be repaired surgically. Pt endorses seeing blood and stool when wiping vaginal area and nausea.

## 2021-03-31 NOTE — ED Provider Notes (Signed)
Emergency Medicine Provider Triage Evaluation Note  Amanda Garner , a 50 y.o. female  was evaluated in triage.  Pt complains of left lower quadrant pain this been ongoing for the past 2 weeks.  Prior history of diverticulitis, was advised to have this surgically repaired, however failed to do so over a year ago.  Reports when she wipes there is blood present in her vaginal area, also the second wipe mostly feces.  Not been able to have any bowel movements.  Reports a prior hysterectomy but no other surgical intervention, also endorses some nausea.  Review of Systems  Positive: Abdominal pain, nausea,urinary symptoms Negative: Fever, vomiting, chest pain,   Physical Exam  BP (!) 170/117 (BP Location: Left Arm)   Pulse 90   Temp 98.1 F (36.7 C) (Oral)   Resp 15   Ht 5' 6"  (1.676 m)   Wt 97.5 kg   LMP 08/20/2011   SpO2 99%   BMI 34.70 kg/m  Gen:   Awake, no distress   Resp:  Normal effort  MSK:   Moves extremities without difficulty  Other:    Medical Decision Making  Medically screening exam initiated at 6:20 PM.  Appropriate orders placed.  [name] was informed that the remainder of the evaluation will be completed by another provider, this initial triage assessment does not replace that evaluation, and the importance of remaining in the ED until their evaluation is complete.     Janeece Fitting, PA-C 03/31/21 1823    Pattricia Boss, MD 03/31/21 2258

## 2021-04-01 ENCOUNTER — Telehealth (HOSPITAL_COMMUNITY): Payer: Self-pay | Admitting: Emergency Medicine

## 2021-04-01 ENCOUNTER — Emergency Department (HOSPITAL_COMMUNITY): Payer: Self-pay

## 2021-04-01 MED ORDER — IOHEXOL 300 MG/ML  SOLN
80.0000 mL | Freq: Once | INTRAMUSCULAR | Status: AC | PRN
  Administered 2021-04-01: 80 mL via INTRAVENOUS

## 2021-04-01 MED ORDER — SODIUM CHLORIDE 0.9 % IV BOLUS
1000.0000 mL | Freq: Once | INTRAVENOUS | Status: AC
  Administered 2021-04-01: 1000 mL via INTRAVENOUS

## 2021-04-01 MED ORDER — OXYCODONE-ACETAMINOPHEN 5-325 MG PO TABS: 1.0000 | ORAL_TABLET | Freq: Four times a day (QID) | ORAL | 0 refills | Status: DC | PRN

## 2021-04-01 MED ORDER — CIPROFLOXACIN HCL 500 MG PO TABS: 500.0000 mg | ORAL_TABLET | Freq: Two times a day (BID) | ORAL | 0 refills | Status: DC

## 2021-04-01 MED ORDER — OXYCODONE-ACETAMINOPHEN 5-325 MG PO TABS: 2.0000 | ORAL_TABLET | ORAL | 0 refills | Status: DC | PRN

## 2021-04-01 MED ORDER — ONDANSETRON HCL 4 MG/2ML IJ SOLN
4.0000 mg | Freq: Once | INTRAMUSCULAR | Status: AC
  Administered 2021-04-01: 4 mg via INTRAVENOUS
  Filled 2021-04-01: qty 2

## 2021-04-01 MED ORDER — MORPHINE SULFATE (PF) 4 MG/ML IV SOLN
4.0000 mg | Freq: Once | INTRAVENOUS | Status: AC
  Administered 2021-04-01: 4 mg via INTRAVENOUS
  Filled 2021-04-01: qty 1

## 2021-04-01 MED ORDER — METRONIDAZOLE 500 MG PO TABS
500.0000 mg | ORAL_TABLET | Freq: Two times a day (BID) | ORAL | 0 refills | Status: DC
Start: 2021-04-01 — End: 2021-04-02

## 2021-04-01 NOTE — ED Provider Notes (Signed)
[facility] EMERGENCY DEPARTMENT Provider Note   CSN: [id number] Arrival date & time: 03/31/21  1726     History Chief Complaint  Patient presents with   Abdominal Pain    Amanda Garner is a 50 y.o. female.  Patient is a 50 year old female with past medical history of Crohn's disease, hypertension, diverticulitis, and prior hysterectomy.  Patient presenting today for evaluation of left lower quadrant pain.  This is been worsening over the past 2 weeks.  She describes stool coming from her vagina that is occasionally bloody.  She denies any fevers or chills.  She denies any urinary complaints.  Patient was told in the past she may have a fistula forming between her vagina and bowel.  The history is provided by the patient.  Abdominal Pain Pain location:  LLQ Pain quality: cramping   Pain radiates to:  Does not radiate Pain severity:  Moderate Onset quality:  Gradual Duration:  2 weeks Timing:  Constant Progression:  Worsening Chronicity:  New Relieved by:  Nothing Worsened by:  Nothing     Past Medical History:  Diagnosis Date   Acid reflux    Anemia Dx: 2001 or so    Blood transfusion    Crohn disease ([place])    Crohn's disease ([place])    Diverticulitis    Hypertension    Sickle cell trait [facility])     Patient Active Problem List   Diagnosis Date Noted   Acquired palmar and plantar hyperkeratosis 01/25/2021   Hyperglycemia 02/01/2020   Crohn's disease of large intestine with abscess ([place]) 01/31/2020   Diverticulitis of large intestine with abscess without bleeding 06/07/2017   Abscess of sigmoid colon due to diverticulitis 05/24/2017   Tobacco dependence 05/11/2017   Essential hypertension 05/11/2017   Reflux esophagitis 05/11/2017   Tinea pedis of left foot 04/11/2015   C. difficile colitis 08/26/2011    Past Surgical History:  Procedure Laterality Date   ABDOMINAL HYSTERECTOMY  2001   partial hysterectomy - emergency during last C-section    [place]; 1997; 2001   [place]       OB History   No obstetric history on file.     Family History  Problem Relation Age of Onset   Hypertension Mother    Diabetes Mother    Diabetes type II Mother    Diabetes type II Father    Stroke Father    Lung cancer Father 41   Colon cancer Neg Hx     Social History   Tobacco Use   Smoking status: Every Day    Packs/day: 0.50    Years: 31.00    Pack years: 15.50    Types: Cigarettes   Smokeless tobacco: Never  Vaping Use   Vaping Use: Never used  Substance Use Topics   Alcohol use: Yes    Comment: occassionally   Drug use: Never    Home Medications Prior to Admission medications   Medication Sig Start Date End Date Taking? Authorizing Provider  amLODipine (NORVASC) 5 MG tablet Take 1 tablet (5 mg total) by mouth daily. 12/11/20 02/09/21  Hans Eden, NP  ciprofloxacin (CIPRO) 500 MG tablet Take 1 tablet (500 mg total) by mouth 2 (two) times daily. 01/31/20   Elsie Stain, MD  clindamycin (CLEOCIN) 150 MG capsule Take 1 capsule (150 mg total) by mouth every 6 (six) hours. 12/23/20   Lacretia Leigh, MD  ibuprofen (ADVIL) 800 MG tablet Take 1 tablet (800 mg  total) by mouth 3 (three) times daily. 12/23/20   Lacretia Leigh, MD  metroNIDAZOLE (FLAGYL) 500 MG tablet Take 1 tablet (500 mg total) by mouth 3 (three) times daily. 01/31/20   Elsie Stain, MD  Salicylic Acid 6 % CREA Use cream on affected areas twice daily. This will reduce amount of scale on skin. 01/22/21   Ezequiel Essex, MD  senna-docusate (SENOKOT-S) 8.6-50 MG tablet Take 2 tablets by mouth 2 (two) times daily. 01/31/20   Elsie Stain, MD  terbinafine (LAMISIL) 1 % cream Apply 1 application topically 2 (two) times daily. 12/11/20   Hans Eden, NP    Allergies    Patient has no known allergies.  Review of Systems   Review of Systems  Gastrointestinal:  Positive for abdominal pain.  All other systems reviewed and are  negative.  Physical Exam Updated Vital Signs BP (!) 150/89 (BP Location: Left Arm)   Pulse 84   Temp 98.8 F (37.1 C) (Oral)   Resp 18   Ht 5' 6"  (1.676 m)   Wt 97.5 kg   LMP 08/20/2011   SpO2 100%   BMI 34.70 kg/m   Physical Exam Vitals and nursing note reviewed.  Constitutional:      General: She is not in acute distress.    Appearance: She is well-developed. She is not diaphoretic.  HENT:     Head: Normocephalic and atraumatic.  Cardiovascular:     Rate and Rhythm: Normal rate and regular rhythm.     Heart sounds: No murmur heard.   No friction rub. No gallop.  Pulmonary:     Effort: Pulmonary effort is normal. No respiratory distress.     Breath sounds: Normal breath sounds. No wheezing.  Abdominal:     General: Bowel sounds are normal. There is no distension.     Palpations: Abdomen is soft.     Tenderness: There is abdominal tenderness in the left lower quadrant. There is no right CVA tenderness, left CVA tenderness, guarding or rebound.  Musculoskeletal:        General: Normal range of motion.     Cervical back: Normal range of motion and neck supple.  Skin:    General: Skin is warm and dry.  Neurological:     General: No focal deficit present.     Mental Status: She is alert and oriented to person, place, and time.    ED Results / Procedures / Treatments   Labs (all labs ordered are listed, but only abnormal results are displayed) Labs Reviewed  CBC WITH DIFFERENTIAL/PLATELET - Abnormal; Notable for the following components:      Result Value   RBC 5.96 (*)    MCV 69.0 (*)    MCH 21.8 (*)    All other components within normal limits  COMPREHENSIVE METABOLIC PANEL - Abnormal; Notable for the following components:   Glucose, Bld 127 (*)    Total Bilirubin 0.2 (*)    All other components within normal limits  URINALYSIS, ROUTINE W REFLEX MICROSCOPIC - Abnormal; Notable for the following components:   APPearance CLOUDY (*)    Hgb urine dipstick LARGE  (*)    Protein, ur 30 (*)    Leukocytes,Ua LARGE (*)    RBC / HPF >50 (*)    WBC, UA >50 (*)    Bacteria, UA RARE (*)    All other components within normal limits  LIPASE, BLOOD    EKG None  Radiology No results found.  Procedures  Procedures   Medications Ordered in ED Medications  sodium chloride 0.9 % bolus 1,000 mL (has no administration in time range)  ondansetron (ZOFRAN) injection 4 mg (has no administration in time range)  morphine 4 MG/ML injection 4 mg (has no administration in time range)    ED Course  I have reviewed the triage vital signs and the nursing notes.  Pertinent labs & imaging results that were available during my care of the patient were reviewed by me and considered in my medical decision making (see chart for details).    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  Patient feeling better after receiving medications here in the ER.  Remainder of the laboratory studies are unremarkable.  She is afebrile with no white count, but does have evidence for UTI.  Patient will be treated with Cipro for the UTI and I will add Flagyl just in case there is subclinical diverticulitis flaring up.  I have discussed the patient's care with Dr. Windle Guard from general surgery.  Patient will be referred to general surgery for outpatient follow-up with a colorectal surgeon.  Final Clinical Impression(s) / ED Diagnoses Final diagnoses:  None    Rx / DC Orders ED Discharge Orders     None        Veryl Speak, MD 04/01/21 2353

## 2021-04-01 NOTE — Discharge Instructions (Addendum)
Begin taking Cipro and Flagyl as prescribed.  Begin taking Percocet as prescribed as needed for pain.  You need to follow-up in the [facility] with a colorectal surgeon in the next several days.  The contact information for the office has been provided in this discharge summary for you to call and make these arrangements.

## 2021-04-01 NOTE — Telephone Encounter (Signed)
Pharmacy called asking for clarifying instructions on cipro and that the oxycodone was too much (10 mg per dose). These have been modified.

## 2021-04-02 ENCOUNTER — Emergency Department (HOSPITAL_COMMUNITY)
Admission: EM | Admit: 2021-04-02 | Discharge: 2021-04-02 | Disposition: A | Discharge status: Home / Self Care | Payer: Self-pay | Attending: Emergency Medicine | Admitting: Emergency Medicine

## 2021-04-02 ENCOUNTER — Emergency Department (HOSPITAL_COMMUNITY): Payer: Self-pay

## 2021-04-02 ENCOUNTER — Other Ambulatory Visit: Payer: Self-pay

## 2021-04-02 DIAGNOSIS — Z8719 Personal history of other diseases of the digestive system: Secondary | ICD-10-CM | POA: Insufficient documentation

## 2021-04-02 DIAGNOSIS — K5732 Diverticulitis of large intestine without perforation or abscess without bleeding: Secondary | ICD-10-CM | POA: Insufficient documentation

## 2021-04-02 DIAGNOSIS — R1032 Left lower quadrant pain: Secondary | ICD-10-CM

## 2021-04-02 DIAGNOSIS — N824 Other female intestinal-genital tract fistulae: Secondary | ICD-10-CM

## 2021-04-02 DIAGNOSIS — I10 Essential (primary) hypertension: Secondary | ICD-10-CM | POA: Insufficient documentation

## 2021-04-02 DIAGNOSIS — N823 Fistula of vagina to large intestine: Secondary | ICD-10-CM | POA: Insufficient documentation

## 2021-04-02 DIAGNOSIS — Z79899 Other long term (current) drug therapy: Secondary | ICD-10-CM | POA: Insufficient documentation

## 2021-04-02 DIAGNOSIS — F17210 Nicotine dependence, cigarettes, uncomplicated: Secondary | ICD-10-CM | POA: Insufficient documentation

## 2021-04-02 DIAGNOSIS — K5790 Diverticulosis of intestine, part unspecified, without perforation or abscess without bleeding: Secondary | ICD-10-CM

## 2021-04-02 LAB — URINALYSIS, ROUTINE W REFLEX MICROSCOPIC
Bilirubin Urine: NEGATIVE
Glucose, UA: NEGATIVE mg/dL
Ketones, ur: NEGATIVE mg/dL
Nitrite: NEGATIVE
Protein, ur: NEGATIVE mg/dL
Specific Gravity, Urine: 1.018 (ref 1.005–1.030)
WBC, UA: >50 WBC/hpf — ABNORMAL HIGH (ref 0–5)
pH: 5 (ref 5.0–8.0)

## 2021-04-02 LAB — CBC
HCT: 40.8 % (ref 36.0–46.0)
Hemoglobin: 12.8 g/dL (ref 12.0–15.0)
MCH: 21.9 pg — ABNORMAL LOW (ref 26.0–34.0)
MCHC: 31.4 g/dL (ref 30.0–36.0)
MCV: 69.7 fL — ABNORMAL LOW (ref 80.0–100.0)
Platelets: 258 10*3/uL (ref 150–400)
RBC: 5.85 MIL/uL — ABNORMAL HIGH (ref 3.87–5.11)
RDW: 14.9 % (ref 11.5–15.5)
WBC: 6.9 10*3/uL (ref 4.0–10.5)
nRBC: 0 % (ref 0.0–0.2)

## 2021-04-02 LAB — COMPREHENSIVE METABOLIC PANEL
ALT: 16 U/L (ref 0–44)
AST: 19 U/L (ref 15–41)
Albumin: 3.6 g/dL (ref 3.5–5.0)
Alkaline Phosphatase: 62 U/L (ref 38–126)
Anion gap: 8 (ref 5–15)
BUN: 8 mg/dL (ref 6–20)
CO2: 23 mmol/L (ref 22–32)
Calcium: 9.2 mg/dL (ref 8.9–10.3)
Chloride: 108 mmol/L (ref 98–111)
Creatinine, Ser: 0.63 mg/dL (ref 0.44–1.00)
GFR, Estimated: >60 mL/min (ref >60)
Glucose, Bld: 144 mg/dL — ABNORMAL HIGH (ref 70–99)
Potassium: 4 mmol/L (ref 3.5–5.1)
Sodium: 139 mmol/L (ref 135–145)
Total Bilirubin: 0.3 mg/dL (ref 0.3–1.2)
Total Protein: 6.9 g/dL (ref 6.5–8.1)

## 2021-04-02 LAB — LIPASE, BLOOD: Lipase: 44 U/L (ref 11–51)

## 2021-04-02 MED ORDER — METRONIDAZOLE 500 MG PO TABS: 500.0000 mg | ORAL_TABLET | Freq: Two times a day (BID) | ORAL | 0 refills | Status: DC

## 2021-04-02 MED ORDER — SODIUM CHLORIDE 0.9 % IV SOLN
1.0000 g | Freq: Once | INTRAVENOUS | Status: AC
  Administered 2021-04-02: 1 g via INTRAVENOUS
  Filled 2021-04-02: qty 10

## 2021-04-02 MED ORDER — SODIUM CHLORIDE 0.9 % IV BOLUS
1000.0000 mL | Freq: Once | INTRAVENOUS | Status: AC
  Administered 2021-04-02: 1000 mL via INTRAVENOUS

## 2021-04-02 MED ORDER — HYDROMORPHONE HCL 1 MG/ML IJ SOLN
1.0000 mg | Freq: Once | INTRAMUSCULAR | Status: AC
  Administered 2021-04-02: 1 mg via INTRAVENOUS
  Filled 2021-04-02: qty 1

## 2021-04-02 MED ORDER — METRONIDAZOLE 500 MG/100ML IV SOLN
500.0000 mg | Freq: Three times a day (TID) | INTRAVENOUS | Status: DC
  Administered 2021-04-02: 500 mg via INTRAVENOUS
  Filled 2021-04-02: qty 100

## 2021-04-02 MED ORDER — ONDANSETRON HCL 4 MG/2ML IJ SOLN
4.0000 mg | Freq: Once | INTRAMUSCULAR | Status: AC
  Administered 2021-04-02: 4 mg via INTRAVENOUS
  Filled 2021-04-02: qty 2

## 2021-04-02 MED ORDER — ONDANSETRON 4 MG PO TBDP: 4.0000 mg | ORAL_TABLET | Freq: Three times a day (TID) | ORAL | 0 refills | Status: DC | PRN

## 2021-04-02 NOTE — ED Triage Notes (Signed)
Pt evaluated recently in ED for fistula. States that she was referred to surgical services, but unable to tolerate pain. Increased bloody discharge and stool from vagina.

## 2021-04-02 NOTE — Discharge Instructions (Addendum)
You were evaluated at [facility] ED for persistent abdominal pain and bleeding. We would like for you to start taking the following medications:   Flagyl 558m twice daily for 7 days (antibiotic) Keep taking ciprofloxacin (antibiotic) Keep taking percocet as needed for pain Zofran as needed for nausea Over the counter medications for constipation if needed.  Please follow up with your general surgeon at your scheduled appointment. It will be important for you to establish care with a gastroenterologist for your crohn's disease and your colovaginal fistula.   Thank you for allowing [facility] be part of your care.

## 2021-04-02 NOTE — ED Provider Notes (Signed)
[facility] EMERGENCY DEPARTMENT Provider Note   CSN: [id number] Arrival date & time: 04/02/21  0350     History Chief Complaint  Patient presents with   Abdominal Pain    Amanda Garner is a 50 y.o. female PMHx Crohn's disease, diverticulitis, HTN, anemia, prior hysterectomy who presents to [facility] ED for LLQ pain. Patient presented two days ago for same complaint, was found to have colovaginal fistula, was referred to general surgery for follow up. She was also prescribed ciprofloxacin for UTI and flagyl for subclinical diverticulitis. She was prescribed percocet 23m q 6hr x15 tablets for pain. She is here today for persistent pain, persistent bleeding. She is having lower left abdominal pain that is stabbing, intermittent. She has not had a BM in 2-3 days. She endorses fever, nausea without emesis, poor PO intake. She is having bleeding from vaginal area despite hysterectomy. She was able to pickup her cipro and percocet from the pharmacy but was not able to fill flagyl. The oxy is not enough to cover her pain. She was able to set up an appointment with general surgery for the 16th.   Abdominal Pain Associated symptoms: constipation, dysuria, fever, nausea and vaginal bleeding   Associated symptoms: no chest pain, no cough, no diarrhea, no shortness of breath, no sore throat and no vomiting       Past Medical History:  Diagnosis Date   Acid reflux    Anemia Dx: 2001 or so    Blood transfusion    Crohn disease [place]    [place]hn's disease [place]    Diverticulitis    Hypertension    Sickle cell trait [facility]     Patient Active Problem List   Diagnosis Date Noted   Acquired palmar and plantar hyperkeratosis 01/25/2021   Hyperglycemia 02/01/2020   Crohn's disease of large intestine with abscess ([place] 01/31/2020   Diverticulitis of large intestine with abscess without bleeding 06/07/2017   Abscess of sigmoid colon due to diverticulitis 05/24/2017   Tobacco  dependence 05/11/2017   Essential hypertension 05/11/2017   Reflux esophagitis 05/11/2017   Tinea pedis of left foot 04/11/2015   C. difficile colitis 08/26/2011    Past Surgical History:  Procedure Laterality Date   ABDOMINAL HYSTERECTOMY  2001   partial hysterectomy - emergency during last C-section   [place] 1997; [place]   [place]      OB History   No obstetric history on file.     Family History  Problem Relation Age of Onset   Hypertension Mother    Diabetes Mother    Diabetes type II Mother    Diabetes type II Father    Stroke Father    Lung cancer Father 79  Colon cancer Neg Hx     Social History   Tobacco Use   Smoking status: Every Day    Packs/day: 0.50    Years: 31.00    Pack years: 15.50    Types: Cigarettes   Smokeless tobacco: Never  Vaping Use   Vaping Use: Never used  Substance Use Topics   Alcohol use: Yes    Comment: occassionally   Drug use: Never    Home Medications Prior to Admission medications   Medication Sig Start Date End Date Taking? Authorizing Provider  amLODipine (NORVASC) 5 MG tablet Take 1 tablet (5 mg total) by mouth daily. 12/11/20 04/02/21 Yes White, Adrienne R, NP  ciprofloxacin (CIPRO) 500 MG tablet Take 1 tablet (500 mg  total) by mouth 2 (two) times daily. 04/01/21  Yes Sherwood Gambler, MD  ibuprofen (ADVIL) 800 MG tablet Take 1 tablet (800 mg total) by mouth 3 (three) times daily. 12/23/20  Yes Lacretia Leigh, MD  ondansetron (ZOFRAN ODT) 4 MG disintegrating tablet Take 1 tablet (4 mg total) by mouth every 8 (eight) hours as needed for nausea or vomiting. 04/02/21  Yes Wayland Denis, MD  oxyCODONE-acetaminophen (PERCOCET/ROXICET) 5-325 MG tablet Take 1 tablet by mouth every 6 (six) hours as needed for severe pain. 04/01/21  Yes Sherwood Gambler, MD  clindamycin (CLEOCIN) 150 MG capsule Take 1 capsule (150 mg total) by mouth every 6 (six) hours. Patient not taking: No sig reported 12/23/20   Lacretia Leigh,  MD  metroNIDAZOLE (FLAGYL) 500 MG tablet Take 1 tablet (500 mg total) by mouth 2 (two) times daily. One po tid x 7 days 04/02/21   Wayland Denis, MD  Salicylic Acid 6 % CREA Use cream on affected areas twice daily. This will reduce amount of scale on skin. Patient not taking: No sig reported 01/22/21   Ezequiel Essex, MD  senna-docusate (SENOKOT-S) 8.6-50 MG tablet Take 2 tablets by mouth 2 (two) times daily. Patient not taking: No sig reported 01/31/20   Elsie Stain, MD  terbinafine (LAMISIL) 1 % cream Apply 1 application topically 2 (two) times daily. Patient not taking: No sig reported 12/11/20   Hans Eden, NP    Allergies    Patient has no known allergies.  Review of Systems   Review of Systems  Constitutional:  Positive for fever.  HENT:  Negative for congestion, rhinorrhea and sore throat.   Respiratory:  Negative for cough and shortness of breath.   Cardiovascular:  Negative for chest pain.  Gastrointestinal:  Positive for abdominal distention, abdominal pain, anal bleeding, blood in stool, constipation and nausea. Negative for diarrhea and vomiting.  Genitourinary:  Positive for dysuria and vaginal bleeding.   Physical Exam Updated Vital Signs BP (!) 143/76 (BP Location: Right Arm)   Pulse 76   Temp 97.9 F (36.6 C) (Oral)   Resp 17   Ht 5' 6"  (1.676 m)   Wt 97.5 kg   LMP 08/20/2011   SpO2 97%   BMI 34.70 kg/m   Physical Exam Constitutional:      General: She is not in acute distress.    Appearance: She is obese. She is not ill-appearing or toxic-appearing.  HENT:     Head: Normocephalic and atraumatic.     Mouth/Throat:     Mouth: Mucous membranes are moist.  Eyes:     Extraocular Movements: Extraocular movements intact.     Conjunctiva/sclera: Conjunctivae normal.  Cardiovascular:     Rate and Rhythm: Normal rate and regular rhythm.     Pulses: Normal pulses.     Heart sounds: No murmur heard.   No friction rub. No gallop.  Pulmonary:      Effort: Pulmonary effort is normal. No respiratory distress.     Breath sounds: Normal breath sounds. No wheezing, rhonchi or rales.  Abdominal:     General: Abdomen is flat. Bowel sounds are normal. There is distension.     Palpations: Abdomen is soft. There is no mass.     Tenderness: There is abdominal tenderness (LLQ, suprapubic and RLQ tenderness to palpation). There is no guarding.     Hernia: No hernia is present.  Genitourinary:    Rectum: Normal. Guaiac result negative.  Musculoskeletal:     Right lower  leg: No edema.     Left lower leg: No edema.  Skin:    General: Skin is warm and dry.  Neurological:     Mental Status: She is alert and oriented to person, place, and time. Mental status is at baseline.  Psychiatric:        Mood and Affect: Mood normal.        Behavior: Behavior normal.    ED Results / Procedures / Treatments   Labs (all labs ordered are listed, but only abnormal results are displayed) Labs Reviewed  COMPREHENSIVE METABOLIC PANEL - Abnormal; Notable for the following components:      Result Value   Glucose, Bld 144 (*)    All other components within normal limits  CBC - Abnormal; Notable for the following components:   RBC 5.85 (*)    MCV 69.7 (*)    MCH 21.9 (*)    All other components within normal limits  URINALYSIS, ROUTINE W REFLEX MICROSCOPIC - Abnormal; Notable for the following components:   APPearance HAZY (*)    Hgb urine dipstick MODERATE (*)    Leukocytes,Ua LARGE (*)    WBC, UA >50 (*)    Bacteria, UA RARE (*)    All other components within normal limits  LIPASE, BLOOD    EKG None  Radiology CT ABDOMEN PELVIS W CONTRAST  Result Date: 04/01/2021 CLINICAL DATA:  50 year old female with abdominal pain, nausea vomiting. EXAM: CT ABDOMEN AND PELVIS WITH CONTRAST TECHNIQUE: Multidetector CT imaging of the abdomen and pelvis was performed using the standard protocol following bolus administration of intravenous contrast. CONTRAST:   54m OMNIPAQUE IOHEXOL 300 MG/ML  SOLN COMPARISON:  CT Abdomen and Pelvis 01/09/2020 and earlier. FINDINGS: Lower chest: Stable, negative. Hepatobiliary: Negative liver and gallbladder. Pancreas: Negative. Spleen: Negative. Adrenals/Urinary Tract: Normal adrenal glands. Kidneys appear nonobstructed and stable since a 2014 CT. Symmetric renal enhancement and contrast excretion. No nephrolithiasis. Urinary bladder is partially inseparable from the sigmoid colon but appears to remain normal. Stomach/Bowel: Negative rectum. Extensive diverticulosis of the sigmoid colon which has an indistinct appearance similar to that in May. No mesenteric stranding. No mesenteric gas. Upstream descending colon is nondilated. Diverticulosis continues to the junction of the sigmoid and descending. Mild retained stool throughout the upstream colon. Normal appendix on series 3, image 68. No dilated small bowel. Terminal ileum is within normal limits. Small gastric hiatal hernia. Decompressed stomach. Negative duodenum. No free air or free fluid. Vascular/Lymphatic: Suboptimal intravascular contrast bolus but the major arterial structures and portal venous system appear to remain patent. No lymphadenopathy. Reproductive: Uterus appears to be surgically absent. Diminutive or absent ovaries. Less gas within the vagina today, but conspicuous gas at the vaginal cough inseparable from the sigmoid colon on sagittal image 78. Other: No pelvic free fluid. Musculoskeletal: Mild scoliosis. Lower thoracic and lumbar facet arthropathy. No acute osseous abnormality identified. IMPRESSION: 1. Extensive diverticulosis of the sigmoid colon which has an indistinct appearance similar to that in May. No mesenteric inflammation to confirm active diverticulitis. But the sigmoid is chronically inseparable from the vaginal cuff, and a small volume of gas has been present within the vagina or at the cuff on these CTs - suspicious for a Colovaginal Fistula. Query  abnormal vaginal discharge. 2. No other acute or inflammatory process identified in the abdomen or pelvis. Normal appendix. Small hiatal hernia. Electronically Signed   By: HGenevie AnnM.D.   On: 04/01/2021 06:37   DG ABD ACUTE 2+V W 1V CHEST  Result Date: 04/02/2021 CLINICAL DATA:  Abdominal pain EXAM: DG ABDOMEN ACUTE WITH 1 VIEW CHEST COMPARISON:  08/26/2011 FINDINGS: There is no evidence of dilated bowel loops or free intraperitoneal air. No radiopaque calculi or other significant radiographic abnormality is seen. Heart size and mediastinal contours are within normal limits. Both lungs are clear. IMPRESSION: Negative abdominal radiographs.  No acute cardiopulmonary disease. Electronically Signed   By: Rolm Baptise M.D.   On: 04/02/2021 17:35    Procedures Procedures No procedures performed this visit  Medications Ordered in ED Medications  metroNIDAZOLE (FLAGYL) IVPB 500 mg (0 mg Intravenous Stopped 04/02/21 1831)  sodium chloride 0.9 % bolus 1,000 mL (0 mLs Intravenous Stopped 04/02/21 1711)  HYDROmorphone (DILAUDID) injection 1 mg (1 mg Intravenous Given 04/02/21 1653)  ondansetron (ZOFRAN) injection 4 mg (4 mg Intravenous Given 04/02/21 1652)  cefTRIAXone (ROCEPHIN) 1 g in sodium chloride 0.9 % 100 mL IVPB (0 g Intravenous Stopped 04/02/21 1827)    ED Course  I have reviewed the triage vital signs and the nursing notes.  Pertinent labs & imaging results that were available during my care of the patient were reviewed by me and considered in my medical decision making (see chart for details).    MDM Rules/Calculators/A&P                         Lizvette Lightsey is a 50 y.o. female PMHx Crohn's disease, diverticulitis, HTN, anemia, prior hysterectomy, known colovaginal fistula, who presents to [facility] ED for persistent LLQ pain. Vitals on arrival stable, patient afebrile. No acute abdomen on exam, moderate LLQ, suprapubic, RLQ tenderness. CBC without leukocytosis, with stable Hgb of ~13. CMP  unremarkable. Lipase normal. UA with large leukocytes, 50+ WBC, rare bacteria, though this likely to be expected for patient with colovaginal fistula. KUB without evidence of dilated bowel loops or free intraperitoneal air. CXR without acute cardiopulmonary disease. Patient was given 1L bolus of fluids, started on rocephin, given Dilaudid for pain, Zofran for nausea. We will give dose of flagyl while in the ED. Patient had improvement in pain. She is stable for discharge. She was given new paper script for Flagyl as well as zofran for nausea as needed. She was instructed to continue taking the percocet as needed for pain as well as ciprofloxacin which she has already picked up form pharmacy the day prior. She was instructed to follow up with general surgery on the scheduled date.    Final Clinical Impression(s) / ED Diagnoses Final diagnoses:  Left lower quadrant abdominal pain  Colovaginal fistula  Diverticulosis    Rx / DC Orders ED Discharge Orders          Ordered    metroNIDAZOLE (FLAGYL) 500 MG tablet  2 times daily        04/02/21 1851    ondansetron (ZOFRAN ODT) 4 MG disintegrating tablet  Every 8 hours PRN        04/02/21 1851             Wayland Denis, MD 04/02/21 2321    Drenda Freeze, MD 04/03/21 2311

## 2021-05-28 ENCOUNTER — Encounter: Payer: Self-pay | Admitting: Gastroenterology

## 2021-06-30 ENCOUNTER — Encounter (HOSPITAL_COMMUNITY): Payer: Self-pay

## 2021-06-30 ENCOUNTER — Emergency Department (HOSPITAL_COMMUNITY)
Admission: EM | Admit: 2021-06-30 | Discharge: 2021-07-01 | Disposition: A | Discharge status: Home / Self Care | Payer: 59 | Source: Home / Self Care | Attending: Emergency Medicine | Admitting: Emergency Medicine

## 2021-06-30 ENCOUNTER — Emergency Department (HOSPITAL_COMMUNITY): Payer: 59

## 2021-06-30 DIAGNOSIS — K529 Noninfective gastroenteritis and colitis, unspecified: Secondary | ICD-10-CM | POA: Insufficient documentation

## 2021-06-30 DIAGNOSIS — I10 Essential (primary) hypertension: Secondary | ICD-10-CM | POA: Insufficient documentation

## 2021-06-30 DIAGNOSIS — F17210 Nicotine dependence, cigarettes, uncomplicated: Secondary | ICD-10-CM | POA: Insufficient documentation

## 2021-06-30 DIAGNOSIS — K5732 Diverticulitis of large intestine without perforation or abscess without bleeding: Secondary | ICD-10-CM | POA: Insufficient documentation

## 2021-06-30 DIAGNOSIS — K5792 Diverticulitis of intestine, part unspecified, without perforation or abscess without bleeding: Secondary | ICD-10-CM

## 2021-06-30 DIAGNOSIS — Z79899 Other long term (current) drug therapy: Secondary | ICD-10-CM | POA: Insufficient documentation

## 2021-06-30 LAB — COMPREHENSIVE METABOLIC PANEL
ALT: 16 U/L (ref 0–44)
AST: 16 U/L (ref 15–41)
Albumin: 4.1 g/dL (ref 3.5–5.0)
Alkaline Phosphatase: 72 U/L (ref 38–126)
Anion gap: 9 (ref 5–15)
BUN: 12 mg/dL (ref 6–20)
CO2: 22 mmol/L (ref 22–32)
Calcium: 9.2 mg/dL (ref 8.9–10.3)
Chloride: 109 mmol/L (ref 98–111)
Creatinine, Ser: 0.66 mg/dL (ref 0.44–1.00)
GFR, Estimated: >60 mL/min (ref >60)
Glucose, Bld: 113 mg/dL — ABNORMAL HIGH (ref 70–99)
Potassium: 3.6 mmol/L (ref 3.5–5.1)
Sodium: 140 mmol/L (ref 135–145)
Total Bilirubin: 0.4 mg/dL (ref 0.3–1.2)
Total Protein: 7.7 g/dL (ref 6.5–8.1)

## 2021-06-30 LAB — CBC WITH DIFFERENTIAL/PLATELET
Abs Immature Granulocytes: 0.05 10*3/uL (ref 0.00–0.07)
Basophils Absolute: 0 10*3/uL (ref 0.0–0.1)
Basophils Relative: 0 %
Eosinophils Absolute: 0.4 10*3/uL (ref 0.0–0.5)
Eosinophils Relative: 3 %
HCT: 40.9 % (ref 36.0–46.0)
Hemoglobin: 13 g/dL (ref 12.0–15.0)
Immature Granulocytes: 1 %
Lymphocytes Relative: 29 %
Lymphs Abs: 3 10*3/uL (ref 0.7–4.0)
MCH: 21.6 pg — ABNORMAL LOW (ref 26.0–34.0)
MCHC: 31.8 g/dL (ref 30.0–36.0)
MCV: 67.8 fL — ABNORMAL LOW (ref 80.0–100.0)
Monocytes Absolute: 0.5 10*3/uL (ref 0.1–1.0)
Monocytes Relative: 5 %
Neutro Abs: 6.3 10*3/uL (ref 1.7–7.7)
Neutrophils Relative %: 62 %
Platelets: 321 10*3/uL (ref 150–400)
RBC: 6.03 MIL/uL — ABNORMAL HIGH (ref 3.87–5.11)
RDW: 15.7 % — ABNORMAL HIGH (ref 11.5–15.5)
WBC: 10.3 10*3/uL (ref 4.0–10.5)
nRBC: 0 % (ref 0.0–0.2)

## 2021-06-30 LAB — LIPASE, BLOOD: Lipase: 25 U/L (ref 11–51)

## 2021-06-30 MED ORDER — HYDROMORPHONE HCL 1 MG/ML IJ SOLN
1.0000 mg | Freq: Once | INTRAMUSCULAR | Status: AC
  Administered 2021-06-30: 1 mg via INTRAVENOUS
  Filled 2021-06-30: qty 1

## 2021-06-30 MED ORDER — ONDANSETRON HCL 4 MG/2ML IJ SOLN
4.0000 mg | Freq: Once | INTRAMUSCULAR | Status: AC
  Administered 2021-06-30: 4 mg via INTRAVENOUS
  Filled 2021-06-30: qty 2

## 2021-06-30 MED ORDER — IOHEXOL 350 MG/ML SOLN
80.0000 mL | Freq: Once | INTRAVENOUS | Status: AC | PRN
  Administered 2021-06-30: 80 mL via INTRAVENOUS

## 2021-06-30 MED ORDER — SODIUM CHLORIDE 0.9 % IV BOLUS
1000.0000 mL | Freq: Once | INTRAVENOUS | Status: AC
  Administered 2021-06-30: 1000 mL via INTRAVENOUS

## 2021-06-30 MED ORDER — HYDROMORPHONE HCL 1 MG/ML IJ SOLN
0.5000 mg | Freq: Once | INTRAMUSCULAR | Status: AC | PRN
  Administered 2021-06-30: 0.5 mg via INTRAVENOUS
  Filled 2021-06-30: qty 1

## 2021-06-30 NOTE — ED Provider Notes (Addendum)
Emergency Medicine Provider Triage Evaluation Note  Amanda Garner , a 50 y.o. female  was evaluated in triage.  Pt complains of left lower quadrant abdominal pain that severe nature.  Is supposed to have a surgery by Dr. Dema Severin, however her appointment has now been moved from tomorrow to December.  Reports worsening pain to her left lower quadrant, she reports passing stool through her vagina, does have a history of a colovaginal fistula.  Reports no blood on today's stool episode.  Has been taking Pepto-Bismol without any improvement in symptoms.  Not on any coags.  Review of Systems  Positive: Abdominal pain, nausea Negative: Vomiting, blood in stool  Physical Exam  LMP 08/20/2011  Gen:   Awake, no distress   Resp:  Normal effort  MSK:   Moves extremities without difficulty  Other:  Teary-eyed on exam, worsening pain along the left lower quadrant.  Tachycardic in exam room.  Medical Decision Making  Medically screening exam initiated at 2:27 PM.  Appropriate orders placed.  Amanda Garner was informed that the remainder of the evaluation will be completed by another provider, this initial triage assessment does not replace that evaluation, and the importance of remaining in the ED until their evaluation is complete.     Amanda Fitting, PA-C 06/30/21 Amanda Garner, Amanda Wenke, PA-C 06/30/21 1433    Amanda Shanks, MD 06/30/21 1521

## 2021-06-30 NOTE — ED Triage Notes (Signed)
Pt arrived via POV, c/o abd pain. States she has a fistula between vagina and bowel and she has pain intermittently.

## 2021-06-30 NOTE — ED Provider Notes (Signed)
[facility] DEPT Provider Note   CSN: [id number] Arrival date & time: 06/30/21  1406    History Chief Complaint  Patient presents with   Abdominal Pain    Amanda Garner is a 50 y.o. female with a hx of crohn's disease, diverticulitis, hypertension, anemia, c.diff, and prior hysterectomy who presents to the ED with acute worsening left sided abdominal pain that began @ 0500 AM. Patient reports that she has had problems with waxing/waning left sided abdominal pain with loose/bloody stool for the past 4 months,  she was diagnosed with a colovaginal fistula in August, has been seen by general surgery team and has a follow up appointment scheduled for 07/29/21 to set a date for surgery. She continues to have left sided pain with loose/bloody stool from the rectum and vagina, however this AM she had acute worsening of her pain, this is severe, no alleviating factors, and is accompanied by nausea. Denies fever, emesis, dysuria, chest pain, dyspnea, cough, or syncope.    HPI     Past Medical History:  Diagnosis Date   Acid reflux    Anemia Dx: 2001 or so    Blood transfusion    Crohn disease ([place])    Crohn's disease ([place])    Diverticulitis    Hypertension    Sickle cell trait [facility])     Patient Active Problem List   Diagnosis Date Noted   Acquired palmar and plantar hyperkeratosis 01/25/2021   Hyperglycemia 02/01/2020   Crohn's disease of large intestine with abscess ([place]) 01/31/2020   Diverticulitis of large intestine with abscess without bleeding 06/07/2017   Abscess of sigmoid colon due to diverticulitis 05/24/2017   Tobacco dependence 05/11/2017   Essential hypertension 05/11/2017   Reflux esophagitis 05/11/2017   Tinea pedis of left foot 04/11/2015   C. difficile colitis 08/26/2011    Past Surgical History:  Procedure Laterality Date   ABDOMINAL HYSTERECTOMY  2001   partial hysterectomy - emergency during last C-section   [place]; 1997; 2001   [place]       OB History   No obstetric history on file.     Family History  Problem Relation Age of Onset   Hypertension Mother    Diabetes Mother    Diabetes type II Mother    Diabetes type II Father    Stroke Father    Lung cancer Father 51   Colon cancer Neg Hx     Social History   Tobacco Use   Smoking status: Every Day    Packs/day: 0.50    Years: 31.00    Pack years: 15.50    Types: Cigarettes   Smokeless tobacco: Never  Vaping Use   Vaping Use: Never used  Substance Use Topics   Alcohol use: Yes    Comment: occassionally   Drug use: Never    Home Medications Prior to Admission medications   Medication Sig Start Date End Date Taking? Authorizing Provider  amLODipine (NORVASC) 5 MG tablet Take 1 tablet (5 mg total) by mouth daily. 12/11/20 04/02/21  Hans Eden, NP  ciprofloxacin (CIPRO) 500 MG tablet Take 1 tablet (500 mg total) by mouth 2 (two) times daily. 04/01/21   Sherwood Gambler, MD  clindamycin (CLEOCIN) 150 MG capsule Take 1 capsule (150 mg total) by mouth every 6 (six) hours. Patient not taking: No sig reported 12/23/20   Lacretia Leigh, MD  ibuprofen (ADVIL) 800 MG tablet Take 1 tablet (800 mg total) by  mouth 3 (three) times daily. 12/23/20   Lacretia Leigh, MD  metroNIDAZOLE (FLAGYL) 500 MG tablet Take 1 tablet (500 mg total) by mouth 2 (two) times daily. One po tid x 7 days 04/02/21   Wayland Denis, MD  ondansetron (ZOFRAN ODT) 4 MG disintegrating tablet Take 1 tablet (4 mg total) by mouth every 8 (eight) hours as needed for nausea or vomiting. 04/02/21   Wayland Denis, MD  oxyCODONE-acetaminophen (PERCOCET/ROXICET) 5-325 MG tablet Take 1 tablet by mouth every 6 (six) hours as needed for severe pain. 04/01/21   Sherwood Gambler, MD  Salicylic Acid 6 % CREA Use cream on affected areas twice daily. This will reduce amount of scale on skin. Patient not taking: No sig reported 01/22/21   Ezequiel Essex, MD  senna-docusate  (SENOKOT-S) 8.6-50 MG tablet Take 2 tablets by mouth 2 (two) times daily. Patient not taking: No sig reported 01/31/20   Elsie Stain, MD  terbinafine (LAMISIL) 1 % cream Apply 1 application topically 2 (two) times daily. Patient not taking: No sig reported 12/11/20   Hans Eden, NP    Allergies    Patient has no known allergies.  Review of Systems   Review of Systems  Constitutional:  Negative for chills and fever.  Respiratory:  Negative for cough and shortness of breath.   Cardiovascular:  Negative for chest pain.  Gastrointestinal:  Positive for abdominal pain, blood in stool, diarrhea and nausea. Negative for vomiting.  Genitourinary:  Positive for vaginal bleeding. Negative for dysuria.  Neurological:  Negative for syncope.  All other systems reviewed and are negative.  Physical Exam Updated Vital Signs BP 123/87   Pulse 97   Temp 98.8 F (37.1 C) (Oral)   Resp 17   Ht 5' 6"  (1.676 m)   Wt 95.3 kg   LMP 08/20/2011   SpO2 95%   BMI 33.89 kg/m   Physical Exam Vitals and nursing note reviewed.  Constitutional:      General: She is in acute distress (patient appears uncomfortable).     Appearance: She is well-developed. She is not toxic-appearing.  HENT:     Head: Normocephalic and atraumatic.  Eyes:     General:        Right eye: No discharge.        Left eye: No discharge.     Conjunctiva/sclera: Conjunctivae normal.  Cardiovascular:     Rate and Rhythm: Normal rate and regular rhythm.  Pulmonary:     Effort: Pulmonary effort is normal. No respiratory distress.     Breath sounds: Normal breath sounds. No wheezing, rhonchi or rales.  Abdominal:     General: There is no distension.     Palpations: Abdomen is soft.     Tenderness: There is abdominal tenderness in the left upper quadrant and left lower quadrant.  Genitourinary:    Comments: RN present as chaperone. DRE with brown stool, no gross blood, fecal occult negative, did not perform speculum  exam, no gross blood to external vagina.  Musculoskeletal:     Cervical back: Neck supple.  Skin:    General: Skin is warm and dry.     Findings: No rash.  Neurological:     Mental Status: She is alert.     Comments: Clear speech.   Psychiatric:        Behavior: Behavior normal.    ED Results / Procedures / Treatments   Labs (all labs ordered are listed, but only abnormal results are displayed)  Labs Reviewed  CBC WITH DIFFERENTIAL/PLATELET - Abnormal; Notable for the following components:      Result Value   RBC 6.03 (*)    MCV 67.8 (*)    MCH 21.6 (*)    RDW 15.7 (*)    All other components within normal limits  COMPREHENSIVE METABOLIC PANEL - Abnormal; Notable for the following components:   Glucose, Bld 113 (*)    All other components within normal limits  LIPASE, BLOOD  URINALYSIS, ROUTINE W REFLEX MICROSCOPIC    EKG None  Radiology CT Abdomen Pelvis W Contrast  Result Date: 06/30/2021 CLINICAL DATA:  Left lower quadrant and periumbilical abdominal pain. EXAM: CT ABDOMEN AND PELVIS WITH CONTRAST TECHNIQUE: Multidetector CT imaging of the abdomen and pelvis was performed using the standard protocol following bolus administration of intravenous contrast. CONTRAST:  61m OMNIPAQUE IOHEXOL 350 MG/ML SOLN COMPARISON:  04/01/2021. FINDINGS: Lower chest: Subsegmental atelectasis or scarring is noted in the left lower lobe Hepatobiliary: No focal liver abnormality is seen. No gallstones, gallbladder wall thickening, or biliary dilatation. Pancreas: Unremarkable. No pancreatic ductal dilatation or surrounding inflammatory changes. Spleen: Normal in size without focal abnormality. Adrenals/Urinary Tract: Adrenal glands are unremarkable. Kidneys are normal, without renal calculi, focal lesion, or hydronephrosis. The urinary bladder is partially distended and abuts the sigmoid colon, similar in appearance to the prior exam. Stomach/Bowel: There is a small hiatal hernia. No bowel  obstruction, free air, or pneumatosis. There is bowel wall thickening with surrounding fat stranding and a small amount of free fluid in the mesenteric folds involving the mid to distal ileum in the lower abdomen and pelvis. Scattered diverticula present along the colon. There is bowel wall thickening at the sigmoid colon with mild surrounding inflammatory changes. No abscess is identified. The appendix is not visualized on exam. Vascular/Lymphatic: No significant vascular findings are present. No enlarged abdominal or pelvic lymph nodes. Reproductive: The uterus is not visualized on exam. Other: A small amount of free fluid is noted in the cul-de-sac. There is diastasis of the rectus abdominus with a broad-based fat containing umbilical hernia. Musculoskeletal: Degenerative changes are present in the thoracic spine. No acute osseous abnormality. IMPRESSION: 1. Bowel wall thickening involving the distal ileum in the mid to lower abdomen and pelvis with surrounding fat stranding, suggesting infectious or inflammatory enteritis. 2. Colonic diverticulosis with bowel wall thickening and mild surrounding fat stranding at the sigmoid colon, suggesting diverticulitis. No abscess or free air. 3. Small hiatal hernia. 4. Remaining findings as described above. Electronically Signed   By: LBrett FairyM.D.   On: 06/30/2021 23:31    Procedures Procedures   Medications Ordered in ED Medications - No data to display  ED Course  I have reviewed the triage vital signs and the nursing notes.  Pertinent labs & imaging results that were available during my care of the patient were reviewed by me and considered in my medical decision making (see chart for details).    MDM Rules/Calculators/A&P                           Patient presents to the ED with complaints of worsening abdominal pain in the setting of known colovaginal fistula with Crohns disease. Patient is nontoxic, vitals with elevated BP. She appears  uncomfortable, she is tender to palpation to the left abdomen.   Additional history obtained:  Additional history obtained from chart review & nursing note review.  Seen by general surgery 05/26/21-  Lab Tests:  I reviewed and interpreted labs, which included:  CBC: No leukocytosis or anemia.  CMP: Unremarkable.  Lipase: WNL UA: Elevated specific gravity- no UTI.   Dilaudid ordered for pain, zofran for nausea, and fluids for hydration, plan for CT A/P for further assessment.   Imaging Studies ordered:  I ordered imaging studies which included CT A/P, I independently reviewed, formal radiology impression shows:  1. Bowel wall thickening involving the distal ileum in the mid to lower abdomen and pelvis with surrounding fat stranding, suggesting infectious or inflammatory enteritis. 2. Colonic diverticulosis with bowel wall thickening and mild surrounding fat stranding at the sigmoid colon, suggesting diverticulitis. No abscess or free air. 3. Small hiatal hernia. 4. Remaining findings as described above.   ED Course:  CT with findings of infectious versus inflammatory enteritis as well as findings of diverticulitis.  No abscess or free air.  Plan to treat diverticulitis with Augmentin, will give a dose of Unasyn in the emergency department.  Given patient's enteritis findings w/ hx of Crohns will discuss with gastroenterology regarding +/- steroids.   00:04; CONSULT: Discussed with gastroenterologist Dr. Ardis Hughs- in agreement with abx, would not recommend steroids, appreciate input.   Patient feeling improved, tolerating PO, received a dose of unasyn in the ED, will discharge home with augmentin & supportive care. [place] Controlled Substance reporting System queried. I discussed results, treatment plan, need for follow-up, and return precautions with the patient. Provided opportunity for questions, patient confirmed understanding and is in agreement with plan.   Findings and plan of  care discussed with supervising physician Dr. Wyvonnia Dusky who is in agreement.   Portions of this note were generated with [employer]. Dictation errors may occur despite best attempts at proofreading.  Final Clinical Impression(s) / ED Diagnoses Final diagnoses:  Diverticulitis  Enteritis    Rx / DC Orders ED Discharge Orders          Ordered    amoxicillin-clavulanate (AUGMENTIN) 875-125 MG tablet  Every 12 hours        07/01/21 0054    ondansetron (ZOFRAN ODT) 4 MG disintegrating tablet  Every 8 hours PRN        07/01/21 0054    HYDROcodone-acetaminophen (NORCO/VICODIN) 5-325 MG tablet  Every 6 hours PRN        07/01/21 0054             Amaryllis Dyke, PA-C 07/01/21 0429    Ezequiel Essex, MD 07/01/21 [phone]

## 2021-07-01 ENCOUNTER — Ambulatory Visit: Payer: Self-pay | Admitting: Gastroenterology

## 2021-07-01 LAB — URINALYSIS, ROUTINE W REFLEX MICROSCOPIC
Bilirubin Urine: NEGATIVE
Glucose, UA: NEGATIVE mg/dL
Hgb urine dipstick: NEGATIVE
Ketones, ur: NEGATIVE mg/dL
Leukocytes,Ua: NEGATIVE
Nitrite: NEGATIVE
Protein, ur: NEGATIVE mg/dL
Specific Gravity, Urine: >1.046 — ABNORMAL HIGH (ref 1.005–1.030)
pH: 5 (ref 5.0–8.0)

## 2021-07-01 MED ORDER — HYDROMORPHONE HCL 1 MG/ML IJ SOLN
0.5000 mg | Freq: Once | INTRAMUSCULAR | Status: AC
  Administered 2021-07-01: 0.5 mg via INTRAVENOUS

## 2021-07-01 MED ORDER — HYDROCODONE-ACETAMINOPHEN 5-325 MG PO TABS: 1.0000 | ORAL_TABLET | Freq: Four times a day (QID) | ORAL | 0 refills | Status: DC | PRN

## 2021-07-01 MED ORDER — HYDROMORPHONE HCL 1 MG/ML IJ SOLN
1.0000 mg | Freq: Once | INTRAMUSCULAR | Status: DC
  Filled 2021-07-01: qty 1

## 2021-07-01 MED ORDER — AMOXICILLIN-POT CLAVULANATE 875-125 MG PO TABS: 1.0000 | ORAL_TABLET | Freq: Two times a day (BID) | ORAL | 0 refills | Status: DC

## 2021-07-01 MED ORDER — SODIUM CHLORIDE 0.9 % IV SOLN
3.0000 g | Freq: Once | INTRAVENOUS | Status: AC
  Administered 2021-07-01: 3 g via INTRAVENOUS
  Filled 2021-07-01: qty 8

## 2021-07-01 MED ORDER — ONDANSETRON 4 MG PO TBDP: 4.0000 mg | ORAL_TABLET | Freq: Three times a day (TID) | ORAL | 0 refills | Status: DC | PRN

## 2021-07-01 NOTE — ED Notes (Signed)
Pt provided with ginger ale 

## 2021-07-01 NOTE — Discharge Instructions (Addendum)
You were seen in the emergency department today for worsening abdominal pain.  Your CT scan showed findings of enteritis and diverticulitis.  We are sending you home with Augmentin which is an antibiotic.  Please take all of your antibiotics until finished. You may develop abdominal discomfort or diarrhea from the antibiotic.  You may help offset this with probiotics which you can buy at the store (ask your pharmacist if unable to find) or get probiotics in the form of eating yogurt.   We also sent you in Zofran to take every 8 hours as needed for nausea and vomiting and Norco to help with pain. -Norco-this is a narcotic/controlled substance medication that has potential addicting qualities.  We recommend that you take 1-2 tablets every 6 hours as needed for severe pain.  Do not drive or operate heavy machinery when taking this medicine as it can be sedating. Do not drink alcohol or take other sedating medications when taking this medicine for safety reasons.  Keep this out of reach of small children.  Please be aware this medicine has Tylenol in it (325 mg/tab) do not exceed the maximum dose of Tylenol in a day per over the counter recommendations should you decide to supplement with Tylenol over the counter.   We have prescribed you new medication(s) today. Discuss the medications prescribed today with your pharmacist as they can have adverse effects and interactions with your other medicines including over the counter and prescribed medications. Seek medical evaluation if you start to experience new or abnormal symptoms after taking one of these medicines, seek care immediately if you start to experience difficulty breathing, feeling of your throat closing, facial swelling, or rash as these could be indications of a more serious allergic reaction  Please follow attached diet guidelines for a clear liquid diet for the next 48 hours and then slowly advance your diet.  Please follow-up with your GI doctor  as well as general surgery.  Return to the emergency department for new or worsening symptoms including but not limited to new or worsening pain, fever, inability to keep fluids down, increased bleeding, passing out, dizziness, or any other concerns.

## 2021-07-02 ENCOUNTER — Ambulatory Visit (INDEPENDENT_AMBULATORY_CARE_PROVIDER_SITE_OTHER): Payer: 59 | Admitting: Gastroenterology

## 2021-07-02 ENCOUNTER — Inpatient Hospital Stay (HOSPITAL_COMMUNITY)
Admission: AD | Admit: 2021-07-02 | Discharge: 2021-07-05 | DRG: 392 | Disposition: A | Discharge status: Home / Self Care | Payer: 59 | Attending: Student | Admitting: Student

## 2021-07-02 ENCOUNTER — Encounter: Payer: Self-pay | Admitting: Gastroenterology

## 2021-07-02 VITALS — BP 140/90 | HR 113 | Temp 98.6°F | Ht 66.0 in | Wt 208.0 lb

## 2021-07-02 DIAGNOSIS — K5732 Diverticulitis of large intestine without perforation or abscess without bleeding: Principal | ICD-10-CM | POA: Diagnosis present

## 2021-07-02 DIAGNOSIS — Z792 Long term (current) use of antibiotics: Secondary | ICD-10-CM

## 2021-07-02 DIAGNOSIS — Z8719 Personal history of other diseases of the digestive system: Secondary | ICD-10-CM | POA: Diagnosis not present

## 2021-07-02 DIAGNOSIS — N824 Other female intestinal-genital tract fistulae: Secondary | ICD-10-CM | POA: Diagnosis not present

## 2021-07-02 DIAGNOSIS — E669 Obesity, unspecified: Secondary | ICD-10-CM | POA: Diagnosis present

## 2021-07-02 DIAGNOSIS — Z801 Family history of malignant neoplasm of trachea, bronchus and lung: Secondary | ICD-10-CM

## 2021-07-02 DIAGNOSIS — D573 Sickle-cell trait: Secondary | ICD-10-CM | POA: Diagnosis present

## 2021-07-02 DIAGNOSIS — D649 Anemia, unspecified: Secondary | ICD-10-CM | POA: Diagnosis present

## 2021-07-02 DIAGNOSIS — Z833 Family history of diabetes mellitus: Secondary | ICD-10-CM | POA: Diagnosis not present

## 2021-07-02 DIAGNOSIS — E876 Hypokalemia: Secondary | ICD-10-CM | POA: Diagnosis not present

## 2021-07-02 DIAGNOSIS — F17210 Nicotine dependence, cigarettes, uncomplicated: Secondary | ICD-10-CM | POA: Diagnosis present

## 2021-07-02 DIAGNOSIS — K219 Gastro-esophageal reflux disease without esophagitis: Secondary | ICD-10-CM | POA: Diagnosis present

## 2021-07-02 DIAGNOSIS — N823 Fistula of vagina to large intestine: Secondary | ICD-10-CM | POA: Diagnosis present

## 2021-07-02 DIAGNOSIS — Z823 Family history of stroke: Secondary | ICD-10-CM | POA: Diagnosis not present

## 2021-07-02 DIAGNOSIS — R1032 Left lower quadrant pain: Secondary | ICD-10-CM | POA: Diagnosis present

## 2021-07-02 DIAGNOSIS — Z20822 Contact with and (suspected) exposure to covid-19: Secondary | ICD-10-CM | POA: Diagnosis present

## 2021-07-02 DIAGNOSIS — K5090 Crohn's disease, unspecified, without complications: Secondary | ICD-10-CM | POA: Diagnosis present

## 2021-07-02 DIAGNOSIS — Z8249 Family history of ischemic heart disease and other diseases of the circulatory system: Secondary | ICD-10-CM | POA: Diagnosis not present

## 2021-07-02 DIAGNOSIS — Z79899 Other long term (current) drug therapy: Secondary | ICD-10-CM

## 2021-07-02 DIAGNOSIS — K5792 Diverticulitis of intestine, part unspecified, without perforation or abscess without bleeding: Secondary | ICD-10-CM

## 2021-07-02 DIAGNOSIS — Z6833 Body mass index (BMI) 33.0-33.9, adult: Secondary | ICD-10-CM | POA: Diagnosis not present

## 2021-07-02 DIAGNOSIS — F17200 Nicotine dependence, unspecified, uncomplicated: Secondary | ICD-10-CM | POA: Diagnosis not present

## 2021-07-02 DIAGNOSIS — I10 Essential (primary) hypertension: Secondary | ICD-10-CM | POA: Diagnosis present

## 2021-07-02 DIAGNOSIS — Z23 Encounter for immunization: Secondary | ICD-10-CM

## 2021-07-02 LAB — RESP PANEL BY RT-PCR (FLU A&B, COVID) ARPGX2
Influenza A by PCR: NEGATIVE
Influenza B by PCR: NEGATIVE
SARS Coronavirus 2 by RT PCR: NEGATIVE

## 2021-07-02 LAB — CBC WITH DIFFERENTIAL/PLATELET
Abs Immature Granulocytes: 0.03 10*3/uL (ref 0.00–0.07)
Basophils Absolute: 0 10*3/uL (ref 0.0–0.1)
Basophils Relative: 0 %
Eosinophils Absolute: 0.3 10*3/uL (ref 0.0–0.5)
Eosinophils Relative: 4 %
HCT: 38.1 % (ref 36.0–46.0)
Hemoglobin: 12.3 g/dL (ref 12.0–15.0)
Immature Granulocytes: 0 %
Lymphocytes Relative: 30 %
Lymphs Abs: 2.1 10*3/uL (ref 0.7–4.0)
MCH: 21.7 pg — ABNORMAL LOW (ref 26.0–34.0)
MCHC: 32.3 g/dL (ref 30.0–36.0)
MCV: 67.3 fL — ABNORMAL LOW (ref 80.0–100.0)
Monocytes Absolute: 0.5 10*3/uL (ref 0.1–1.0)
Monocytes Relative: 7 %
Neutro Abs: 4.2 10*3/uL (ref 1.7–7.7)
Neutrophils Relative %: 59 %
Platelets: 253 10*3/uL (ref 150–400)
RBC: 5.66 MIL/uL — ABNORMAL HIGH (ref 3.87–5.11)
RDW: 14.8 % (ref 11.5–15.5)
WBC: 7 10*3/uL (ref 4.0–10.5)
nRBC: 0 % (ref 0.0–0.2)

## 2021-07-02 LAB — COMPREHENSIVE METABOLIC PANEL
ALT: 26 U/L (ref 0–44)
AST: 32 U/L (ref 15–41)
Albumin: 3.4 g/dL — ABNORMAL LOW (ref 3.5–5.0)
Alkaline Phosphatase: 72 U/L (ref 38–126)
Anion gap: 8 (ref 5–15)
BUN: 7 mg/dL (ref 6–20)
CO2: 22 mmol/L (ref 22–32)
Calcium: 8.8 mg/dL — ABNORMAL LOW (ref 8.9–10.3)
Chloride: 107 mmol/L (ref 98–111)
Creatinine, Ser: 0.71 mg/dL (ref 0.44–1.00)
GFR, Estimated: >60 mL/min (ref >60)
Glucose, Bld: 103 mg/dL — ABNORMAL HIGH (ref 70–99)
Potassium: 3.3 mmol/L — ABNORMAL LOW (ref 3.5–5.1)
Sodium: 137 mmol/L (ref 135–145)
Total Bilirubin: 0.4 mg/dL (ref 0.3–1.2)
Total Protein: 6.5 g/dL (ref 6.5–8.1)

## 2021-07-02 LAB — HIV ANTIBODY (ROUTINE TESTING W REFLEX): HIV Screen 4th Generation wRfx: NONREACTIVE

## 2021-07-02 MED ORDER — METRONIDAZOLE 500 MG/100ML IV SOLN: 500.0000 mg | Freq: Three times a day (TID) | INTRAVENOUS | Status: DC

## 2021-07-02 MED ORDER — NICOTINE 14 MG/24HR TD PT24
14.0000 mg | MEDICATED_PATCH | Freq: Every day | TRANSDERMAL | Status: DC
  Administered 2021-07-02 – 2021-07-05 (×4): 14 mg via TRANSDERMAL
  Filled 2021-07-02 (×4): qty 1

## 2021-07-02 MED ORDER — MORPHINE SULFATE (PF) 2 MG/ML IV SOLN
1.0000 mg | INTRAVENOUS | Status: DC | PRN
  Administered 2021-07-02 – 2021-07-04 (×5): 1 mg via INTRAVENOUS
  Filled 2021-07-02 (×5): qty 1

## 2021-07-02 MED ORDER — ONDANSETRON HCL 4 MG/2ML IJ SOLN: 4.0000 mg | Freq: Three times a day (TID) | INTRAMUSCULAR | Status: DC | PRN

## 2021-07-02 MED ORDER — LACTATED RINGERS IV SOLN: INTRAVENOUS | Status: DC

## 2021-07-02 MED ORDER — ACETAMINOPHEN 325 MG PO TABS: 650.0000 mg | ORAL_TABLET | Freq: Four times a day (QID) | ORAL | Status: DC | PRN

## 2021-07-02 MED ORDER — SODIUM CHLORIDE 0.9 % IV SOLN
2.0000 g | INTRAVENOUS | Status: DC
  Administered 2021-07-02 – 2021-07-04 (×3): 2 g via INTRAVENOUS
  Filled 2021-07-02 (×3): qty 20

## 2021-07-02 MED ORDER — HYDROMORPHONE HCL 1 MG/ML IJ SOLN
0.5000 mg | Freq: Once | INTRAMUSCULAR | Status: AC
  Administered 2021-07-02: 0.5 mg via INTRAVENOUS
  Filled 2021-07-02 (×2): qty 0.5

## 2021-07-02 MED ORDER — METRONIDAZOLE 500 MG/100ML IV SOLN
500.0000 mg | Freq: Two times a day (BID) | INTRAVENOUS | Status: DC
  Administered 2021-07-02 – 2021-07-05 (×6): 500 mg via INTRAVENOUS
  Filled 2021-07-02 (×6): qty 100

## 2021-07-02 MED ORDER — ACETAMINOPHEN 650 MG RE SUPP: 650.0000 mg | Freq: Four times a day (QID) | RECTAL | Status: DC | PRN

## 2021-07-02 MED ORDER — ALUM & MAG HYDROXIDE-SIMETH 200-200-20 MG/5ML PO SUSP
30.0000 mL | Freq: Four times a day (QID) | ORAL | Status: DC | PRN
  Administered 2021-07-02: 30 mL via ORAL
  Filled 2021-07-02: qty 30

## 2021-07-02 MED ORDER — HYDROCODONE-ACETAMINOPHEN 5-325 MG PO TABS
1.0000 | ORAL_TABLET | ORAL | Status: DC | PRN
  Administered 2021-07-02 – 2021-07-05 (×5): 1 via ORAL
  Filled 2021-07-02 (×5): qty 1

## 2021-07-02 NOTE — Patient Instructions (Signed)
If you are age 51 or older, your body mass index should be between 23-30. Your Body mass index is 33.57 kg/m. If this is out of the aforementioned range listed, please consider follow up with your Primary Care Provider.  If you are age 17 or younger, your body mass index should be between 19-25. Your Body mass index is 33.57 kg/m. If this is out of the aformentioned range listed, please consider follow up with your Primary Care Provider.   We are sending you to [facility] and will call for further instructions.   The [facility] providers would like to encourage you to use [facility] to communicate with providers for non-urgent requests or questions.  Due to long hold times on the telephone, sending your provider a message by [facility] may be a faster and more efficient way to get a response.  Please allow 48 business hours for a response.  Please remember that this is for non-urgent requests.  _______________________________________________________

## 2021-07-02 NOTE — Progress Notes (Signed)
07/02/2021 Amanda Garner [id number] [birth year]   HISTORY OF PRESENT ILLNESS:  This is a 50 year old female with a past medical history of recurrent diverticulitis complicated previously by abscess.  Was supposed to have a colonoscopy in 2018 and that was likely going to be followed by an elective surgical resection.  Unfortunately she was never able to have those performed and has continued to have recurrent episodes of diverticulitis.  Now has a colovaginal fistula.  Was seen by surgery in early October and they wanted her to have a colonoscopy before surgery.  Unfortunately she now again has an acute episode of diverticulitis.  Has been miserable for the past couple of weeks, unable to eat, unable to sleep, drinking minimal amounts of fluids.  Was in the emergency department 2 days ago where CT scan confirmed diverticulitis, but also showed bowel wall thickening involving the distal ileum and the mid to lower abdomen and pelvis with surrounding fat stranding suggesting infectious or inflammatory enteritis.  She has Crohn's disease listed in her past medical history, but I do not think this has ever been a formal diagnosis as she has never had a colonoscopy. Patient is on oral Augmentin.  Still having moderate pain.   CT scan of the abdomen and pelvis with contrast on 11/8 showed the following:  IMPRESSION: 1. Bowel wall thickening involving the distal ileum in the mid to lower abdomen and pelvis with surrounding fat stranding, suggesting infectious or inflammatory enteritis. 2. Colonic diverticulosis with bowel wall thickening and mild surrounding fat stranding at the sigmoid colon, suggesting diverticulitis. No abscess or free air. 3. Small hiatal hernia. 4. Remaining findings as described above.  Reports passing stool from her vagina.  Also reports having some rectal bleeding and vaginal bleeding as well, but is status post hysterectomy.  Having nausea and acid reflux.  Referred by  Dr. Dema Severin on this occasion for rectal bleeding and to discuss colonoscopy.  Past Medical History:  Diagnosis Date   Acid reflux    Anemia Dx: 2001 or so    Blood transfusion    Crohn disease ([place])    Crohn's disease ([place])    Diverticulitis    Hypertension    Sickle cell trait ([place])    Past Surgical History:  Procedure Laterality Date   ABDOMINAL HYSTERECTOMY  2001   partial hysterectomy - emergency during last C-section   [place]; 1997; 2001   [place]      reports that she has been smoking cigarettes. She has a 15.50 pack-year smoking history. She has never used smokeless tobacco. She reports current alcohol use. She reports that she does not use drugs. family history includes Diabetes in her mother; Diabetes type II in her father and mother; Hypertension in her mother; Lung cancer (age of onset: 9) in her father; Stroke in her father. No Known Allergies    Outpatient Encounter Medications as of 07/02/2021  Medication Sig   amoxicillin-clavulanate (AUGMENTIN) 875-125 MG tablet Take 1 tablet by mouth every 12 (twelve) hours.   HYDROcodone-acetaminophen (NORCO/VICODIN) 5-325 MG tablet Take 1-2 tablets by mouth every 6 (six) hours as needed for severe pain.   ondansetron (ZOFRAN ODT) 4 MG disintegrating tablet Take 1 tablet (4 mg total) by mouth every 8 (eight) hours as needed for nausea or vomiting.   [DISCONTINUED] acetaminophen (TYLENOL) 500 MG tablet Take 1,000 mg by mouth every 6 (six) hours as needed for moderate pain. (Patient not taking: Reported on 07/02/2021)   No  facility-administered encounter medications on file as of 07/02/2021.     REVIEW OF SYSTEMS  : All other systems reviewed and negative except where noted in the History of Present Illness.   PHYSICAL EXAM: BP 140/90   Pulse (!) 113   Temp 98.6 F (37 C)   Ht 5' 6"  (1.676 m)   Wt 208 lb (94.3 kg)   LMP 08/20/2011   SpO2 97%   BMI 33.57 kg/m  General: Well developed AA female  in no acute distress Head: Normocephalic and atraumatic Eyes:  Sclerae anicteric, conjunctiva pink. Ears: Normal auditory acuity Lungs: Clear throughout to auscultation; no W/R/R. Heart: Tachy.  No M/R/G. Abdomen: Soft, non-distended.  BS present.  Non-tender. Musculoskeletal: Symmetrical with no gross deformities  Skin: No lesions on visible extremities Extremities: No edema  Neurological: Alert oriented x 4, grossly non-focal Psychological:  Alert and cooperative. Normal mood and affect  ASSESSMENT AND PLAN: *50 year old female with a past medical history of recurrent diverticulitis complicated previously by abscess.  Was supposed to have a colonoscopy in 2018 and that was likely going to be followed by an elective surgical resection.  Unfortunately she was never able to have those performed and has continued to have recurrent episodes of diverticulitis.  Now has a colovaginal fistula.  Was seen by surgery in early October and they wanted her to have a colonoscopy before surgery.  Unfortunately she now again has an acute episode of diverticulitis.  Has been miserable for the past couple of weeks, unable to eat, unable to sleep, drinking minimal amounts of fluids.  Was in the emergency department 2 days ago where CT scan confirmed diverticulitis, but also showed bowel wall thickening involving the distal ileum and the mid to lower abdomen and pelvis with surrounding fat stranding suggesting infectious or inflammatory enteritis.  She has Crohn's disease listed in her past medical history, but I do not think this has ever been a formal diagnosis as she has never had a colonoscopy.  Certainly could be possible with these other findings on CT scan.  Patient is on oral Augmentin.  Still having moderate amount of pain and not taking much by mouth.  I think she would benefit from hospital admission for IV fluids, IV antibiotics, pain control, antiemetics.  Would definitely get surgery on board as a she is at  high risk for complications given her history.  Likely no colonoscopy will be able to be performed in the next several weeks due to the high risk of perforation in the acute diverticulitis setting.  Patient was accepted for direct admission by the hospitalist service.  She will be admitted to [facility].   CC:  Ileana Roup, MD

## 2021-07-02 NOTE — H&P (Signed)
History and Physical    Amanda Garner KAJ:[id number] DOB: 03-31-1971 DOA: 07/02/2021  PCP: Pcp, No Consultants:  GI Dr. Hilarie Fredrickson  Patient coming from:  Home - lives with husband and her sons.   Chief Complaint: stomach pain, N/V/D.   HPI: Amanda Garner is a 51 y.o. female with medical history significant of recurrent diverticulitis complicated previously by absces, hypertension, GERD and tobacco dependence who was directly admitted to the hospital from her gastroenterologist.  Is followed by [facility].  Recently seen in ED on 11/8 for acute diverticulitis and placed on oral Augmentin.  She followed up in clinic today and was seen by the PA.  Due to history of being in pain, unable to eat or sleep and only being able to drink minimal amounts of fluid thought it would be best to put in hospital for IV fluids, pain management and IV antibiotics. She has never had a colonoscopy. She tells me that she has had about 2-3 weeks of recurrent stomach pain that is located in LLQ and is rated as 10/10. Pain is described as stabbing and it comes and goes. The oral antibiotics seemed to have made it worse. She has a burning sensation all of the time in her stomach. She has had low grade fevers "on and off to 99.9" for the past few weeks. She has had poor PO intake and has vomiting with food. She also has diarrhea with every BM, unsure if any blood. She also has stool and blood and come out of her vagina with BM due to fistula.   She was supposed to have a colonoscopy in 2018 that was likely going to be followed by an elective surgical resection however this was never performed she continues to have recurrent episodes of diverticulitis.  She now has a colovaginal fistula.  She was seen by surgery in early October but they wanted her to have a colonoscopy before she had surgery.  Smokes about 5 cigarettes/day. No alcohol.   Denies any vision changes, headaches, chest pain, palpitations, shortness of breath or  cough, leg swelling. She denies any dysuria or flank pain.   Vitals in clinic: Afebrile, blood pressure 140/90, heart rate 113, oxygen 97% on room air.  Review of Systems: As per HPI; otherwise review of systems reviewed and negative.   Ambulatory Status:  Ambulates without assistance   Past Medical History:  Diagnosis Date   Acid reflux    Anemia Dx: 2001 or so    Blood transfusion    Crohn disease ([place])    Crohn's disease ([place])    Diverticulitis    Hypertension    Sickle cell trait ([place])     Past Surgical History:  Procedure Laterality Date   ABDOMINAL HYSTERECTOMY  2001   partial hysterectomy - emergency during last C-section   [place]; 1997; 2001   CESAREAN SECTION      Social History   Socioeconomic History   Marital status: Married    Spouse name: Not on file   Number of children: 3   Years of education: Not on file   Highest education level: Not on file  Occupational History   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, general practice  Tobacco Use   Smoking status: Every Day    Packs/day: 0.50    Years: 31.00    Pack years: 15.50    Types: Cigarettes   Smokeless tobacco: Never  Vaping  Use   Vaping Use: Never used  Substance and Sexual Activity   Alcohol use: Yes    Comment: occassionally   Drug use: Never   Sexual activity: Yes    Partners: Male    Birth control/protection: Surgical  Other Topics Concern   Not on file  Social History Narrative   ** Merged History Encounter **       Social Determinants of Health   Financial Resource Strain: Not on file  Food Insecurity: Not on file  Transportation Needs: Not on file  Physical Activity: Not on file  Stress: Not on file  Social Connections: Not on file  Intimate Partner Violence: Not on file    No Known Allergies  Family History  Problem Relation Age of Onset   Hypertension Mother    Diabetes Mother    Diabetes type  II Mother    Diabetes type II Father    Stroke Father    Lung cancer Father 52   Colon cancer Neg Hx     Prior to Admission medications   Medication Sig Start Date End Date Taking? Authorizing Provider  amoxicillin-clavulanate (AUGMENTIN) 875-125 MG tablet Take 1 tablet by mouth every 12 (twelve) hours. 07/01/21   Petrucelli, Glynda Jaeger, PA-C  HYDROcodone-acetaminophen (NORCO/VICODIN) 5-325 MG tablet Take 1-2 tablets by mouth every 6 (six) hours as needed for severe pain. 07/01/21   Petrucelli, Samantha R, PA-C  ondansetron (ZOFRAN ODT) 4 MG disintegrating tablet Take 1 tablet (4 mg total) by mouth every 8 (eight) hours as needed for nausea or vomiting. 07/01/21   Petrucelli, Glynda Jaeger, PA-C    Physical Exam: Vitals:   07/02/21 1745  BP: (!) 149/103  Pulse: 90  Resp: 20  Temp: 97.6 F (36.4 C)  TempSrc: Oral  SpO2: 96%     General:  Appears calm and comfortable and is in NAD Eyes:  PERRL, EOMI, normal lids, iris ENT:  grossly normal hearing, lips & tongue, mmm; appropriate dentition Neck:  no LAD, masses or thyromegaly; no carotid bruits Cardiovascular:  RRR, no m/r/g. No LE edema.  Respiratory:   CTA bilaterally with no wheezes/rales/rhonchi.  Normal respiratory effort. Abdomen:  soft, TTP LLQ, periumbilical area. No rebound or guarding.  Back:   normal alignment, no CVAT Skin:  no rash or induration seen on limited exam. Normal capillary refill. No skin tenting  Musculoskeletal:  grossly normal tone BUE/BLE, good ROM, no bony abnormality Lower extremity:  No LE edema.  Limited foot exam with no ulcerations.  2+ distal pulses. Psychiatric:  grossly normal mood and affect, speech fluent and appropriate, AOx3 Neurologic:  CN 2-12 grossly intact, moves all extremities in coordinated fashion, sensation intact    Radiological Exams on Admission: Independently reviewed - see discussion in A/P where applicable  CT Abdomen Pelvis W Contrast  Result Date: 06/30/2021 CLINICAL  DATA:  Left lower quadrant and periumbilical abdominal pain. EXAM: CT ABDOMEN AND PELVIS WITH CONTRAST TECHNIQUE: Multidetector CT imaging of the abdomen and pelvis was performed using the standard protocol following bolus administration of intravenous contrast. CONTRAST:  85m OMNIPAQUE IOHEXOL 350 MG/ML SOLN COMPARISON:  04/01/2021. FINDINGS: Lower chest: Subsegmental atelectasis or scarring is noted in the left lower lobe Hepatobiliary: No focal liver abnormality is seen. No gallstones, gallbladder wall thickening, or biliary dilatation. Pancreas: Unremarkable. No pancreatic ductal dilatation or surrounding inflammatory changes. Spleen: Normal in size without focal abnormality. Adrenals/Urinary Tract: Adrenal glands are unremarkable. Kidneys are normal, without renal calculi, focal lesion, or hydronephrosis. The urinary  bladder is partially distended and abuts the sigmoid colon, similar in appearance to the prior exam. Stomach/Bowel: There is a small hiatal hernia. No bowel obstruction, free air, or pneumatosis. There is bowel wall thickening with surrounding fat stranding and a small amount of free fluid in the mesenteric folds involving the mid to distal ileum in the lower abdomen and pelvis. Scattered diverticula present along the colon. There is bowel wall thickening at the sigmoid colon with mild surrounding inflammatory changes. No abscess is identified. The appendix is not visualized on exam. Vascular/Lymphatic: No significant vascular findings are present. No enlarged abdominal or pelvic lymph nodes. Reproductive: The uterus is not visualized on exam. Other: A small amount of free fluid is noted in the cul-de-sac. There is diastasis of the rectus abdominus with a broad-based fat containing umbilical hernia. Musculoskeletal: Degenerative changes are present in the thoracic spine. No acute osseous abnormality. IMPRESSION: 1. Bowel wall thickening involving the distal ileum in the mid to lower abdomen and  pelvis with surrounding fat stranding, suggesting infectious or inflammatory enteritis. 2. Colonic diverticulosis with bowel wall thickening and mild surrounding fat stranding at the sigmoid colon, suggesting diverticulitis. No abscess or free air. 3. Small hiatal hernia. 4. Remaining findings as described above. Electronically Signed   By: Brett Fairy M.D.   On: 06/30/2021 23:31     Labs on Admission: I have personally reviewed the available labs and imaging studies at the time of the admission.  Pertinent labs:  Pending     Assessment/Plan Principal Problem:   Acute diverticulitis with colovaginal fistula in setting of prior hysterectomy  -50 year old with recurrent diverticulitis presenting with acute diverticulitis flair x 3 weeks -start rocephin and flagyl, IVF and pain control.  -clear liquid diet  -labs pending  -she has never had colonoscopy and can not do at this point with acute flair and due to recurrent episodes of diverticulitis has been hard to complete this.  -has been seen by colorectal surgery, Dr. Dema Severin, on 05/26/21.  -consult general surgery. Does not have a surgical abdomen on exam and CT from 11/8 showed no abscess or free air.  -SCDs in case of any surgical procedure    Active Problems:   Essential hypertension Has been out of her amlodipine 5m for at least 3 weeks.  Blood pressures have been decently controlled.  Will hold on starting this tonight. Follow bp readings and add back if indicated.     Tobacco dependence  Nicotine patch   Social work consult for PCP.   There is no height or weight on file to calculate BMI.   Level of care:  DVT prophylaxis:  SCDs for possible surgery  Code Status:  Full - confirmed with patient/family Family Communication: husband at bedside: LNydia Bouton Disposition Plan:  The patient is from: home  Anticipated d/c is to: home Requires inpatient hospitalization and is at significant risk of worsening, requires  constant monitoring, IV antibiotics/fluids and assessment and MDM with specialists.   Patient is currently: stable  Consults called: general surgery   Admission status:  inpatient   Dragon dictation used in completing this note.   AOrma FlamingMD [facility]   How to contact the [facility] LL[facility]ttending or Consulting provider [place] covering provider during after hours [place] for this patient?  Check the care team in [facility]and look for a) attending/consulting TR[facility] provider listed and b) the [facility] listed Log into [email or web address] and use [place]'s universal  password to access. If you do not have the password, please contact the hospital operator. Locate the [facility] provider you are looking for under [facility] and page to a number that you can be directly reached. If you still have difficulty reaching the provider, please page the [facility] (Director on Call) for the Hospitalists listed on amion for assistance.   07/02/2021, 6:24 PM

## 2021-07-03 ENCOUNTER — Encounter (HOSPITAL_COMMUNITY): Payer: Self-pay | Admitting: Family Medicine

## 2021-07-03 ENCOUNTER — Other Ambulatory Visit: Payer: Self-pay

## 2021-07-03 DIAGNOSIS — D649 Anemia, unspecified: Secondary | ICD-10-CM

## 2021-07-03 DIAGNOSIS — N824 Other female intestinal-genital tract fistulae: Secondary | ICD-10-CM

## 2021-07-03 DIAGNOSIS — I10 Essential (primary) hypertension: Secondary | ICD-10-CM

## 2021-07-03 DIAGNOSIS — F17200 Nicotine dependence, unspecified, uncomplicated: Secondary | ICD-10-CM

## 2021-07-03 DIAGNOSIS — Z8719 Personal history of other diseases of the digestive system: Secondary | ICD-10-CM

## 2021-07-03 DIAGNOSIS — E876 Hypokalemia: Secondary | ICD-10-CM

## 2021-07-03 LAB — HEMOGLOBIN A1C
Hgb A1c MFr Bld: 5.8 % — ABNORMAL HIGH (ref 4.8–5.6)
Mean Plasma Glucose: 119.76 mg/dL

## 2021-07-03 LAB — BASIC METABOLIC PANEL
Anion gap: 9 (ref 5–15)
BUN: 5 mg/dL — ABNORMAL LOW (ref 6–20)
CO2: 25 mmol/L (ref 22–32)
Calcium: 8.6 mg/dL — ABNORMAL LOW (ref 8.9–10.3)
Chloride: 106 mmol/L (ref 98–111)
Creatinine, Ser: 0.63 mg/dL (ref 0.44–1.00)
GFR, Estimated: >60 mL/min (ref >60)
Glucose, Bld: 98 mg/dL (ref 70–99)
Potassium: 3.1 mmol/L — ABNORMAL LOW (ref 3.5–5.1)
Sodium: 140 mmol/L (ref 135–145)

## 2021-07-03 LAB — CBC
HCT: 37.1 % (ref 36.0–46.0)
Hemoglobin: 11.7 g/dL — ABNORMAL LOW (ref 12.0–15.0)
MCH: 21.5 pg — ABNORMAL LOW (ref 26.0–34.0)
MCHC: 31.5 g/dL (ref 30.0–36.0)
MCV: 68.1 fL — ABNORMAL LOW (ref 80.0–100.0)
Platelets: 235 10*3/uL (ref 150–400)
RBC: 5.45 MIL/uL — ABNORMAL HIGH (ref 3.87–5.11)
RDW: 14.9 % (ref 11.5–15.5)
WBC: 6.9 10*3/uL (ref 4.0–10.5)
nRBC: 0 % (ref 0.0–0.2)

## 2021-07-03 MED ORDER — POTASSIUM CHLORIDE CRYS ER 20 MEQ PO TBCR
40.0000 meq | EXTENDED_RELEASE_TABLET | ORAL | Status: AC
  Administered 2021-07-03 (×2): 40 meq via ORAL
  Filled 2021-07-03 (×2): qty 2

## 2021-07-03 MED ORDER — INFLUENZA VAC SPLIT QUAD 0.5 ML IM SUSY
0.5000 mL | PREFILLED_SYRINGE | INTRAMUSCULAR | Status: AC
  Administered 2021-07-04: 0.5 mL via INTRAMUSCULAR
  Filled 2021-07-03: qty 0.5

## 2021-07-03 MED ORDER — PANTOPRAZOLE SODIUM 40 MG PO TBEC
40.0000 mg | DELAYED_RELEASE_TABLET | Freq: Every day | ORAL | Status: DC
  Administered 2021-07-03 – 2021-07-05 (×3): 40 mg via ORAL
  Filled 2021-07-03 (×3): qty 1

## 2021-07-03 NOTE — Consult Note (Signed)
Consult Note  Amanda Garner 04/21/1971  [id number].    Requesting MD: Dr. Rogers Blocker Chief Complaint/Reason for Consult: history of diverticulitis and colovaginal fistula  HPI:  50 year old female with a past medical history of HTN, recurrent diverticulitis (since 2751) complicated previously by abscess and also with colovaginal fistula. She has reported history of Chron's disease. She has been seen by Dr. Dema Severin with general surgery outpatient for surgical planning in 05/2021 and was reccommended to see GI for colonoscopy prior to robotic sigmoidectomy. Due to ongoing intermittent diverticulitis she has been unable to complete colonoscopy yet.  Patient reported to the ED on 11/8 due to acute worsening of her left lower quadrant pain. Work up at that time showed CT scan with Bowel wall thickening involving the distal ileum in the mid to lower abdomen and pelvis with surrounding fat stranding, suggesting infectious or inflammatory enteritis. 2. Colonic diverticulosis with bowel wall thickening and mild surrounding fat stranding at the sigmoid colon, suggesting diverticulitis. She was discharged from ED on antibiotics and followed up with GI on 11/10. Due to ongoing diverticulitis symptoms she was direct admitted to [facility] hospitalist service for further treatment. Per GI no colonoscopy for several weeks due to risk for perforation.  She states she has some level of abdominal pain on a daily basis but pain acutely worsened 11/8 and she developed nausea/emesis prompting presentation to ED. Pain and nausea had been persistent for the last two days but this am nausea is resolved and pain improved. She also has diarrhea and has increased issues with stool out from vagina in setting of known colovaginal fistula. ROS otherwise as below  Substance use: current smoker Allergies: nkda Past surgeries: abdominal hysterectomy, cesarean section x3  ROS: Review of Systems  Constitutional:  Negative for  chills and fever.  Respiratory:  Negative for cough, shortness of breath and wheezing.   Cardiovascular:  Negative for chest pain, palpitations and leg swelling.  Gastrointestinal:  Positive for abdominal pain, diarrhea, nausea and vomiting. Negative for constipation.  Genitourinary: Negative.    Family History  Problem Relation Age of Onset   Hypertension Mother    Diabetes Mother    Diabetes type II Mother    Diabetes type II Father    Stroke Father    Lung cancer Father 86   Colon cancer Neg Hx     Past Medical History:  Diagnosis Date   Acid reflux    Anemia Dx: 2001 or so    Blood transfusion    Crohn disease ([place])    Crohn's disease ([place])    Diverticulitis    Hypertension    Sickle cell trait ([place])     Past Surgical History:  Procedure Laterality Date   ABDOMINAL HYSTERECTOMY  2001   partial hysterectomy - emergency during last C-section   [place]; 1997; 2001   CESAREAN SECTION      Social History:  reports that she has been smoking cigarettes. She has a 15.50 pack-year smoking history. She has never used smokeless tobacco. She reports current alcohol use. She reports that she does not use drugs.  Allergies: No Known Allergies  Medications Prior to Admission  Medication Sig Dispense Refill   amoxicillin-clavulanate (AUGMENTIN) 875-125 MG tablet Take 1 tablet by mouth every 12 (twelve) hours. 20 tablet 0   HYDROcodone-acetaminophen (NORCO/VICODIN) 5-325 MG tablet Take 1-2 tablets by mouth every 6 (six) hours as needed for severe pain. 12 tablet 0   ondansetron (ZOFRAN ODT)  4 MG disintegrating tablet Take 1 tablet (4 mg total) by mouth every 8 (eight) hours as needed for nausea or vomiting. 8 tablet 0    Blood pressure 123/80, pulse 82, temperature 98.2 F (36.8 C), temperature source Oral, resp. rate 18, height 5' 6"  (1.676 m), weight 94.3 kg, last menstrual period 08/20/2011, SpO2 100 %. Physical Exam:  General: pleasant, WD, female who is  sitting up in bed in NAD HEENT: head is normocephalic, atraumatic.  Sclera are noninjected.  Pupils equal and round. EOMs intact.  Ears and nose without any masses or lesions.  Mouth is pink and moist Heart: regular, rate, and rhythm.  Normal s1,s2. No obvious murmurs, gallops, or rubs noted.  Palpable radial and pedal pulses bilaterally Lungs: CTAB, no wheezes, rhonchi, or rales noted.  Respiratory effort nonlabored Abd: soft, ND, +BS, no masses, hernias, or organomegaly. Mild to moderate abdominal pain in epigastrium, LUQ, LLQ. Greatest in LLQ without rebound. Voluntary guarding MSK: all 4 extremities are symmetrical with no cyanosis, clubbing, or edema. Skin: warm and dry with no masses, lesions, or rashes Neuro: Cranial nerves 2-12 grossly intact, sensation is normal throughout Psych: A&Ox3 with an appropriate affect.   Results for orders placed or performed during the hospital encounter of 07/02/21 (from the past 48 hour(s))  Resp Panel by RT-PCR (Flu A&B, Covid) Nasopharyngeal Swab     Status: None   Collection Time: 07/02/21  6:02 PM   Specimen: Nasopharyngeal Swab; Nasopharyngeal(NP) swabs in vial transport medium  Result Value Ref Range   SARS Coronavirus 2 by RT PCR NEGATIVE NEGATIVE    Comment: (NOTE) SARS-CoV-2 target nucleic acids are NOT DETECTED.  The SARS-CoV-2 RNA is generally detectable in upper respiratory specimens during the acute phase of infection. The lowest concentration of SARS-CoV-2 viral copies this assay can detect is 138 copies/mL. A negative result does not preclude SARS-Cov-2 infection and should not be used as the sole basis for treatment or other patient management decisions. A negative result may occur with  improper specimen collection/handling, submission of specimen other than nasopharyngeal swab, presence of viral mutation(s) within the areas targeted by this assay, and inadequate number of viral copies(<138 copies/mL). A negative result must be  combined with clinical observations, patient history, and epidemiological information. The expected result is Negative.  Fact Sheet for Patients:  EntrepreneurPulse.com.au  Fact Sheet for Healthcare Providers:  IncredibleEmployment.be  This test is no t yet approved or cleared by the [place] FDA and  has been authorized for detection and/or diagnosis of SARS-CoV-2 by FDA under an Emergency Use Authorization (EUA). This EUA will remain  in effect (meaning this test can be used) for the duration of the COVID-19 declaration under Section 564(b)(1) of the Act, 21 U.S.C.section 360bbb-3(b)(1), unless the authorization is terminated  or revoked sooner.       Influenza A by PCR NEGATIVE NEGATIVE   Influenza B by PCR NEGATIVE NEGATIVE    Comment: (NOTE) The Xpert Xpress SARS-CoV-2/FLU/RSV plus assay is intended as an aid in the diagnosis of influenza from Nasopharyngeal swab specimens and should not be used as a sole basis for treatment. Nasal washings and aspirates are unacceptable for Xpert Xpress SARS-CoV-2/FLU/RSV testing.  Fact Sheet for Patients: EntrepreneurPulse.com.au  Fact Sheet for Healthcare Providers: IncredibleEmployment.be  This test is not yet approved or cleared by the [place] FDA and has been authorized for detection and/or diagnosis of SARS-CoV-2 by FDA under an Emergency Use Authorization (EUA). This EUA will remain in effect (meaning  this test can be used) for the duration of the COVID-19 declaration under Section 564(b)(1) of the Act, 21 U.S.C. section 360bbb-3(b)(1), unless the authorization is terminated or revoked.  Performed at [facility], [place] [address] [place], [place] [address]   CBC with Differential/Platelet     Status: Abnormal   Collection Time: 07/02/21  6:21 PM  Result Value Ref Range   WBC 7.0 4.0 - 10.5 K/uL   RBC 5.66 (H) 3.87 - 5.11 MIL/uL    Hemoglobin 12.3 12.0 - 15.0 g/dL   HCT 38.1 36.0 - 46.0 %   MCV 67.3 (L) 80.0 - 100.0 fL   MCH 21.7 (L) 26.0 - 34.0 pg   MCHC 32.3 30.0 - 36.0 g/dL   RDW 14.8 11.5 - 15.5 %   Platelets 253 150 - 400 K/uL    Comment: REPEATED TO VERIFY   nRBC 0.0 0.0 - 0.2 %   Neutrophils Relative % 59 %   Neutro Abs 4.2 1.7 - 7.7 K/uL   Lymphocytes Relative 30 %   Lymphs Abs 2.1 0.7 - 4.0 K/uL   Monocytes Relative 7 %   Monocytes Absolute 0.5 0.1 - 1.0 K/uL   Eosinophils Relative 4 %   Eosinophils Absolute 0.3 0.0 - 0.5 K/uL   Basophils Relative 0 %   Basophils Absolute 0.0 0.0 - 0.1 K/uL   Immature Granulocytes 0 %   Abs Immature Granulocytes 0.03 0.00 - 0.07 K/uL    Comment: Performed at [facility], [place] [address] [place], [place] [address]  Comprehensive metabolic panel     Status: Abnormal   Collection Time: 07/02/21  6:21 PM  Result Value Ref Range   Sodium 137 135 - 145 mmol/L   Potassium 3.3 (L) 3.5 - 5.1 mmol/L   Chloride 107 98 - 111 mmol/L   CO2 22 22 - 32 mmol/L   Glucose, Bld 103 (H) 70 - 99 mg/dL    Comment: Glucose reference range applies only to samples taken after fasting for at least 8 hours.   BUN 7 6 - 20 mg/dL   Creatinine, Ser 0.71 0.44 - 1.00 mg/dL   Calcium 8.8 (L) 8.9 - 10.3 mg/dL   Total Protein 6.5 6.5 - 8.1 g/dL   Albumin 3.4 (L) 3.5 - 5.0 g/dL   AST 32 15 - 41 U/L   ALT 26 0 - 44 U/L   Alkaline Phosphatase 72 38 - 126 U/L   Total Bilirubin 0.4 0.3 - 1.2 mg/dL   GFR, Estimated >60 >60 mL/min    Comment: (NOTE) Calculated using the CKD-EPI Creatinine Equation (2021)    Anion gap 8 5 - 15    Comment: Performed at [facility], [place] [address] [place], [place] [address]  HIV Antibody (routine testing w rflx)     Status: None   Collection Time: 07/02/21  6:43 PM  Result Value Ref Range   HIV Screen 4th Generation wRfx Non Reactive Non Reactive    Comment: Performed at [facility], [place] [address], [place], [place] [address]  Basic  metabolic panel     Status: Abnormal   Collection Time: 07/03/21 12:37 AM  Result Value Ref Range   Sodium 140 135 - 145 mmol/L   Potassium 3.1 (L) 3.5 - 5.1 mmol/L   Chloride 106 98 - 111 mmol/L   CO2 25 22 - 32 mmol/L   Glucose, Bld 98 70 - 99 mg/dL    Comment: Glucose reference  range applies only to samples taken after fasting for at least 8 hours.   BUN 5 (L) 6 - 20 mg/dL   Creatinine, Ser 0.63 0.44 - 1.00 mg/dL   Calcium 8.6 (L) 8.9 - 10.3 mg/dL   GFR, Estimated >60 >60 mL/min    Comment: (NOTE) Calculated using the CKD-EPI Creatinine Equation (2021)    Anion gap 9 5 - 15    Comment: Performed at [facility] [address], [place], [place] [address]  CBC     Status: Abnormal   Collection Time: 07/03/21 12:37 AM  Result Value Ref Range   WBC 6.9 4.0 - 10.5 K/uL   RBC 5.45 (H) 3.87 - 5.11 MIL/uL   Hemoglobin 11.7 (L) 12.0 - 15.0 g/dL   HCT 37.1 36.0 - 46.0 %   MCV 68.1 (L) 80.0 - 100.0 fL   MCH 21.5 (L) 26.0 - 34.0 pg   MCHC 31.5 30.0 - 36.0 g/dL   RDW 14.9 11.5 - 15.5 %   Platelets 235 150 - 400 K/uL    Comment: REPEATED TO VERIFY   nRBC 0.0 0.0 - 0.2 %    Comment: Performed at [facility], [place] [address], [place], [place] [address]  Hemoglobin A1c     Status: Abnormal   Collection Time: 07/03/21 12:37 AM  Result Value Ref Range   Hgb A1c MFr Bld 5.8 (H) 4.8 - 5.6 %    Comment: (NOTE) Pre diabetes:          5.7%-6.4%  Diabetes:              >6.4%  Glycemic control for   <7.0% adults with diabetes    Mean Plasma Glucose 119.76 mg/dL    Comment: Performed at [facility] [address], [place],  [address]   No results found.    Assessment/Plan Recurrent diverticulitis Enteritis ?Chron's - CT 11/8 with Colonic diverticulosis with bowel wall thickening and mild surrounding fat stranding at the sigmoid colon, suggesting diverticulitis. No abscess or free air. - pain improved some since admission. Currently tolerating clear  liquids well - WBC normal, afebrile, VSS - no acute surgical intervention indicated at this time and given inflammation would likely entail ostomy in this acute setting - continue IVF and antibiotics per primary - Patient has follow up with established surgeon Dr. Dema Severin on 12/20 and will discuss with surgical team to ensure appropriate follow up timeline  FEN: clear liquid diet, IVF ID: [id number] VTE: okay for chemical prophylaxis from surgical standpoint  Winferd Humphrey, [facility] 07/03/2021, 7:44 AM Please see Amion for pager number during day hours 7:00am-4:30pm

## 2021-07-03 NOTE — Progress Notes (Signed)
PROGRESS NOTE  Amanda Garner ELF:[id number] DOB: Aug 31, 1970   PCP: Pcp, No  Patient is from: Home.  Lives with husband.  Independently ambulates at baseline.  DOA: 07/02/2021 LOS: 1  Chief complaints:  No chief complaint on file.    Brief Narrative / Interim history: 50 year old F with history of recurrent diverticulitis, colovaginal fistula, Crohn's disease (not on meds), HTN, GERD, tobacco use disorder and remote hysterectomy directed to ED from GI office for LLQ pain and poor p.o. intake due to diverticulitis that did not improve with p.o. Augmentin outpatient.  Seen in ED on 11/8, and had CT abdomen with bowel wall thickening including the distal ileum with surrounding fat stranding, and sigmoid diverticulitis.  She was discharged on p.o. Augmentin to follow-up with GI.    Patient was previously of note, she was seen by general surgery, Dr. Dema Severin in 05/2021 for surgical opinion on colovaginal fistula and recurrent diverticulitis that was on hold pending colonoscopy.   On admission, patient was started on IV ceftriaxone, IV Flagyl and IV fluid.  General surgery consulted.  Subjective: Seen and examined earlier this morning.  No major events overnight of this morning.  She has LLQ pain early in the morning that has improved with pain medication.  She rates her pain 2/10 right now.  She reports fecal monitor/diarrhea through her vagina almost every 20 minutes.  Noted a small blood tinge at times.  She has nausea but no emesis.  Denies chest pain or dyspnea.  Objective: Vitals:   07/02/21 2343 07/03/21 0134 07/03/21 0309 07/03/21 0720  BP: (!) 157/91  (!) 142/78 123/80  Pulse: 79  81 82  Resp: 18  20 18   Temp: 97.6 F (36.4 C)  98 F (36.7 C) 98.2 F (36.8 C)  TempSrc: Oral  Oral Oral  SpO2: 98%  99% 100%  Weight:  94.3 kg    Height:  5' 6"  (1.676 m)      Intake/Output Summary (Last 24 hours) at 07/03/2021 1134 Last data filed at 07/03/2021 0935 Gross per 24 hour  Intake  1229.62 ml  Output --  Net 1229.62 ml   Filed Weights   07/03/21 0134  Weight: 94.3 kg    Examination:  GENERAL: No apparent distress.  Nontoxic. HEENT: MMM.  Vision and hearing grossly intact.  NECK: Supple.  No apparent JVD.  RESP: 100% on RA.  No IWOB.  Fair aeration bilaterally. CVS:  RRR. Heart sounds normal.  ABD/GI/GU: BS+. Abd soft.  LLQ tenderness.  No rebound or guarding. MSK/EXT:  Moves extremities. No apparent deformity. No edema.  SKIN: no apparent skin lesion or wound NEURO: Awake, alert and oriented appropriately.  No apparent focal neuro deficit. PSYCH: Calm. Normal affect.   Procedures:  None  Microbiology summarized: HENID-78 and influenza PCR nonreactive.  Assessment & Plan: Acute recurrent diverticulitis  Colovaginal fistula: POA -10/8-CT abdomen and pelvis with bowel wall thickening including the distal ileum with surrounding fat stranding, and sigmoid diverticulitis but no abscess. -Continues to have LLQ pain, poor p.o. intake, mild temperature despite p.o. Augmentin. -Continue IV CTX and IV Flagyl -General surgery following-no plan for surgical intervention while in-house. -Continue CLD, IVF and as needed analgesics -Likely colo followed by surgical eval for colovaginal fistula once she completes antibiotic course for diverticulitis   History of Crohn's disease? Patient denies ever having colonoscopy but diagnosis of Crohn's disease.  Not on medication.  CT finding concerning -Check CRP and ESR -Defer to GI  Normocytic anemia: Relatively stable. Recent  Labs    02/11/21 1500 03/31/21 1820 04/02/21 0901 06/30/21 1638 07/02/21 1821 07/03/21 0037  HGB 12.4 13.0 12.8 13.0 12.3 11.7*  -Continue monitoring   Essential hypertension: Normotensive.  Does not seem to be on medication.  Hypokalemia -Replenish and recheck.   Tobacco use disorder -Couselled and encouraged cessation  -Nicotine patch   GERD -PPI  Social work consult for PCP.    Class I obesity Body mass index is 33.55 kg/m.         DVT prophylaxis:  SCDs Start: 07/02/21 1844  Code Status: Full code Family Communication: Updated patient's husband over FaceTime from her phone Level of care: Med-Surg Status is: Inpatient  Remains inpatient appropriate because: Need for IV fluid and IV antibiotics for recurrent diverticulitis, electrolyte derangement   Final disposition: Likely home in the next 2 to 3 days.   Consultants:  General surgery   Sch Meds:  Scheduled Meds:  [START ON 07/04/2021] influenza vac split quadrivalent PF  0.5 mL Intramuscular Tomorrow-1000   nicotine  14 mg Transdermal Daily   potassium chloride  40 mEq Oral Q4H   Continuous Infusions:  cefTRIAXone (ROCEPHIN)  IV 2 g (07/02/21 2039)   lactated ringers 75 mL/hr at 07/02/21 2037   metronidazole 500 mg (07/03/21 0846)   PRN Meds:.acetaminophen **OR** acetaminophen, alum & mag hydroxide-simeth, HYDROcodone-acetaminophen, morphine injection, ondansetron (ZOFRAN) IV  Antimicrobials: Anti-infectives (From admission, onward)    Start     Dose/Rate Route Frequency Ordered Stop   07/02/21 2030  metroNIDAZOLE (FLAGYL) IVPB 500 mg        500 mg 100 mL/hr over 60 Minutes Intravenous Every 12 hours 07/02/21 1856     07/02/21 2000  cefTRIAXone (ROCEPHIN) 2 g in sodium chloride 0.9 % 100 mL IVPB        2 g 200 mL/hr over 30 Minutes Intravenous Every 24 hours 07/02/21 1842     07/02/21 1930  metroNIDAZOLE (FLAGYL) IVPB 500 mg  Status:  Discontinued        500 mg 100 mL/hr over 60 Minutes Intravenous Every 8 hours 07/02/21 1842 07/02/21 1856        I have personally reviewed the following labs and images: CBC: Recent Labs  Lab 06/30/21 1638 07/02/21 1821 07/03/21 0037  WBC 10.3 7.0 6.9  NEUTROABS 6.3 4.2  --   HGB 13.0 12.3 11.7*  HCT 40.9 38.1 37.1  MCV 67.8* 67.3* 68.1*  PLT 321 253 235   BMP &GFR Recent Labs  Lab 06/30/21 1638 07/02/21 1821 07/03/21 0037  NA  140 137 140  K 3.6 3.3* 3.1*  CL 109 107 106  CO2 22 22 25   GLUCOSE 113* 103* 98  BUN 12 7 5*  CREATININE 0.66 0.71 0.63  CALCIUM 9.2 8.8* 8.6*   Estimated Creatinine Clearance: 96.3 mL/min (by C-G formula based on SCr of 0.63 mg/dL). Liver & Pancreas: Recent Labs  Lab 06/30/21 1638 07/02/21 1821  AST 16 32  ALT 16 26  ALKPHOS 72 72  BILITOT 0.4 0.4  PROT 7.7 6.5  ALBUMIN 4.1 3.4*   Recent Labs  Lab 06/30/21 1638  LIPASE 25   No results for input(s): AMMONIA in the last 168 hours. Diabetic: Recent Labs    07/03/21 0037  HGBA1C 5.8*   No results for input(s): GLUCAP in the last 168 hours. Cardiac Enzymes: No results for input(s): CKTOTAL, CKMB, CKMBINDEX, TROPONINI in the last 168 hours. No results for input(s): PROBNP in the last 8760 hours. Coagulation Profile: No  results for input(s): INR, PROTIME in the last 168 hours. Thyroid Function Tests: No results for input(s): TSH, T4TOTAL, FREET4, T3FREE, THYROIDAB in the last 72 hours. Lipid Profile: No results for input(s): CHOL, HDL, LDLCALC, TRIG, CHOLHDL, LDLDIRECT in the last 72 hours. Anemia Panel: No results for input(s): VITAMINB12, FOLATE, FERRITIN, TIBC, IRON, RETICCTPCT in the last 72 hours. Urine analysis:    Component Value Date/Time   COLORURINE YELLOW 07/01/2021 0020   APPEARANCEUR CLEAR 07/01/2021 0020   LABSPEC >1.046 (H) 07/01/2021 0020   PHURINE 5.0 07/01/2021 0020   GLUCOSEU NEGATIVE 07/01/2021 0020   HGBUR NEGATIVE 07/01/2021 0020   BILIRUBINUR NEGATIVE 07/01/2021 0020   KETONESUR NEGATIVE 07/01/2021 0020   PROTEINUR NEGATIVE 07/01/2021 0020   UROBILINOGEN 1.0 09/07/2012 0019   NITRITE NEGATIVE 07/01/2021 0020   LEUKOCYTESUR NEGATIVE 07/01/2021 0020   Sepsis Labs: Invalid input(s): PROCALCITONIN, [place]  Microbiology: Recent Results (from the past 240 hour(s))  Resp Panel by RT-PCR (Flu A&B, Covid) Nasopharyngeal Swab     Status: None   Collection Time: 07/02/21  6:02 PM    Specimen: Nasopharyngeal Swab; Nasopharyngeal(NP) swabs in vial transport medium  Result Value Ref Range Status   SARS Coronavirus 2 by RT PCR NEGATIVE NEGATIVE Final    Comment: (NOTE) SARS-CoV-2 target nucleic acids are NOT DETECTED.  The SARS-CoV-2 RNA is generally detectable in upper respiratory specimens during the acute phase of infection. The lowest concentration of SARS-CoV-2 viral copies this assay can detect is 138 copies/mL. A negative result does not preclude SARS-Cov-2 infection and should not be used as the sole basis for treatment or other patient management decisions. A negative result may occur with  improper specimen collection/handling, submission of specimen other than nasopharyngeal swab, presence of viral mutation(s) within the areas targeted by this assay, and inadequate number of viral copies(<138 copies/mL). A negative result must be combined with clinical observations, patient history, and epidemiological information. The expected result is Negative.  Fact Sheet for Patients:  EntrepreneurPulse.com.au  Fact Sheet for Healthcare Providers:  IncredibleEmployment.be  This test is no t yet approved or cleared by the [place] FDA and  has been authorized for detection and/or diagnosis of SARS-CoV-2 by FDA under an Emergency Use Authorization (EUA). This EUA will remain  in effect (meaning this test can be used) for the duration of the COVID-19 declaration under Section 564(b)(1) of the Act, 21 U.S.C.section 360bbb-3(b)(1), unless the authorization is terminated  or revoked sooner.       Influenza A by PCR NEGATIVE NEGATIVE Final   Influenza B by PCR NEGATIVE NEGATIVE Final    Comment: (NOTE) The Xpert Xpress SARS-CoV-2/FLU/RSV plus assay is intended as an aid in the diagnosis of influenza from Nasopharyngeal swab specimens and should not be used as a sole basis for treatment. Nasal washings and aspirates are  unacceptable for Xpert Xpress SARS-CoV-2/FLU/RSV testing.  Fact Sheet for Patients: EntrepreneurPulse.com.au  Fact Sheet for Healthcare Providers: IncredibleEmployment.be  This test is not yet approved or cleared by the [place] FDA and has been authorized for detection and/or diagnosis of SARS-CoV-2 by FDA under an Emergency Use Authorization (EUA). This EUA will remain in effect (meaning this test can be used) for the duration of the COVID-19 declaration under Section 564(b)(1) of the Act, 21 U.S.C. section 360bbb-3(b)(1), unless the authorization is terminated or revoked.  Performed at [facility], [place] [address] [place],  [address]     Radiology Studies: No results found.    Havana Baldwin T. Kingdom Vanzanten  [facility]  If 7PM-7AM, please contact night-coverage [email or web address] 07/03/2021, 11:34 AM

## 2021-07-03 NOTE — Plan of Care (Signed)
  Problem: Education: Goal: Knowledge of General Education information will improve Description: Including pain rating scale, medication(s)/side effects and non-pharmacologic comfort measures Outcome: Progressing   Problem: Activity: Goal: Risk for activity intolerance will decrease Outcome: Progressing   Problem: Coping: Goal: Level of anxiety will decrease Outcome: Progressing

## 2021-07-03 NOTE — TOC Initial Note (Signed)
Transition of Care [facility]) - Initial/Assessment Note    Patient Details  Name: Amanda Garner MRN: [id number] Date of Birth: 10-20-1970  [facility]) CM/SW Contact:    Marilu Favre, RN Phone Number: 07/03/2021, 11:38 AM  Clinical Narrative:                 Spoke to patient at bedside. Patient does not have a PCP. Patient agreed for NCM to call [facility] [place] and schedule an appointment. First available is December 21 , 2022 at 0930 am . Information placed on AVS.   Confirmed patient has insurance [employer]. Patient reports she applied for "regular medicaid " but was approved for [facility] . Patient requesting NCM to call Medicaid and have her Medicaid changed. NCM explained patient will need to contact Medicaid and make request. Patient voiced understanding.  Patient states she has transportation to appointments.   Expected Discharge Plan: Home/Self Care     Patient Goals and CMS Choice Patient states their goals for this hospitalization and ongoing recovery are:: to return to home CMS Medicare.gov Compare Post Acute Care list provided to:: Patient    Expected Discharge Plan and Services Expected Discharge Plan: Home/Self Care In-house Referral: Development worker, community, PCP / [facility] Discharge Planning Services: CM Consult   Living arrangements for the past 2 months: Single Family Home                   DME Agency: NA       HH Arranged: NA          Prior Living Arrangements/Services Living arrangements for the past 2 months: Single Family Home Lives with:: Spouse Patient language and need for interpreter reviewed:: Yes Do you feel safe going back to the place where you live?: Yes      Need for Family Participation in Patient Care: No (Comment) Care giver support system in place?: Yes (comment)   Criminal Activity/Legal Involvement Pertinent to Current Situation/Hospitalization: No - Comment as  needed  Activities of Daily Living Home Assistive Devices/Equipment: None ADL Screening (condition at time of admission) Patient's cognitive ability adequate to safely complete daily activities?: Yes Is the patient deaf or have difficulty hearing?: No Does the patient have difficulty seeing, even when wearing glasses/contacts?: No Does the patient have difficulty concentrating, remembering, or making decisions?: No Patient able to express need for assistance with ADLs?: Yes Does the patient have difficulty dressing or bathing?: No Independently performs ADLs?: Yes (appropriate for developmental age) Does the patient have difficulty walking or climbing stairs?: No Weakness of Legs: None Weakness of Arms/Hands: None  Permission Sought/Granted   Permission granted to share information with : No              Emotional Assessment Appearance:: Appears stated age Attitude/Demeanor/Rapport: Engaged Affect (typically observed): Accepting Orientation: : Oriented to Situation, Oriented to  Time, Oriented to Place, Oriented to Self Alcohol / Substance Use: Not Applicable Psych Involvement: No (comment)  Admission diagnosis:  Acute diverticulitis [K57.92] Patient Active Problem List   Diagnosis Date Noted   Acquired palmar and plantar hyperkeratosis 01/25/2021   Hyperglycemia 02/01/2020   Crohn's disease of large intestine with abscess ([place]) 01/31/2020   Acute diverticulitis 01/09/2020   Diverticulitis of large intestine with abscess without bleeding 06/07/2017   Abscess of sigmoid colon due to diverticulitis 05/24/2017   Tobacco dependence 05/11/2017   Essential hypertension 05/11/2017   Reflux esophagitis 05/11/2017   Tinea pedis of  left foot 04/11/2015   C. difficile colitis 08/26/2011   PCP:  Pcp, No Pharmacy:   [facility] [place], [address] [place] [address] Phone:  [phone] Fax: [phone]     Social Determinants of Health (SDOH) Interventions    Readmission Risk Interventions No flowsheet data found.

## 2021-07-04 LAB — CBC
HCT: 36.5 % (ref 36.0–46.0)
Hemoglobin: 11.6 g/dL — ABNORMAL LOW (ref 12.0–15.0)
MCH: 21.5 pg — ABNORMAL LOW (ref 26.0–34.0)
MCHC: 31.8 g/dL (ref 30.0–36.0)
MCV: 67.7 fL — ABNORMAL LOW (ref 80.0–100.0)
Platelets: 245 10*3/uL (ref 150–400)
RBC: 5.39 MIL/uL — ABNORMAL HIGH (ref 3.87–5.11)
RDW: 14.8 % (ref 11.5–15.5)
WBC: 5.5 10*3/uL (ref 4.0–10.5)
nRBC: 0 % (ref 0.0–0.2)

## 2021-07-04 LAB — IRON AND TIBC
Iron: 50 ug/dL (ref 28–170)
Saturation Ratios: 18 % (ref 10.4–31.8)
TIBC: 284 ug/dL (ref 250–450)
UIBC: 234 ug/dL

## 2021-07-04 LAB — RENAL FUNCTION PANEL
Albumin: 3.2 g/dL — ABNORMAL LOW (ref 3.5–5.0)
Anion gap: 7 (ref 5–15)
BUN: <5 mg/dL — ABNORMAL LOW (ref 6–20)
CO2: 24 mmol/L (ref 22–32)
Calcium: 8.7 mg/dL — ABNORMAL LOW (ref 8.9–10.3)
Chloride: 107 mmol/L (ref 98–111)
Creatinine, Ser: 0.64 mg/dL (ref 0.44–1.00)
GFR, Estimated: >60 mL/min (ref >60)
Glucose, Bld: 89 mg/dL (ref 70–99)
Phosphorus: 3.3 mg/dL (ref 2.5–4.6)
Potassium: 4 mmol/L (ref 3.5–5.1)
Sodium: 138 mmol/L (ref 135–145)

## 2021-07-04 LAB — RETICULOCYTES
Immature Retic Fract: 22.5 % — ABNORMAL HIGH (ref 2.3–15.9)
RBC.: 5.38 MIL/uL — ABNORMAL HIGH (ref 3.87–5.11)
Retic Count, Absolute: 82.9 10*3/uL (ref 19.0–186.0)
Retic Ct Pct: 1.5 % (ref 0.4–3.1)

## 2021-07-04 LAB — FOLATE: Folate: 8.6 ng/mL (ref >5.9)

## 2021-07-04 LAB — MAGNESIUM: Magnesium: 1.8 mg/dL (ref 1.7–2.4)

## 2021-07-04 LAB — C-REACTIVE PROTEIN: CRP: 0.8 mg/dL (ref <1.0)

## 2021-07-04 LAB — FERRITIN: Ferritin: 177 ng/mL (ref 11–307)

## 2021-07-04 LAB — VITAMIN B12: Vitamin B-12: 839 pg/mL (ref 180–914)

## 2021-07-04 LAB — SEDIMENTATION RATE: Sed Rate: 25 mm/hr — ABNORMAL HIGH (ref 0–22)

## 2021-07-04 MED ORDER — AMLODIPINE BESYLATE 5 MG PO TABS
5.0000 mg | ORAL_TABLET | Freq: Every day | ORAL | Status: DC
  Administered 2021-07-04: 5 mg via ORAL
  Filled 2021-07-04: qty 1

## 2021-07-04 NOTE — Progress Notes (Signed)
PROGRESS NOTE  Amanda Garner TFT:[id number] DOB: 07/17/1971   PCP: Pcp, No  Patient is from: Home.  Lives with husband.  Independently ambulates at baseline.  DOA: 07/02/2021 LOS: 2  Chief complaints:  No chief complaint on file.    Brief Narrative / Interim history: 50 year old F with history of recurrent diverticulitis, colovaginal fistula, Crohn's disease (not on meds), HTN, GERD, tobacco use disorder and remote hysterectomy directed to ED from GI office for LLQ pain and poor p.o. intake due to diverticulitis that did not improve with p.o. Augmentin outpatient.  Seen in ED on 11/8, and had CT abdomen with bowel wall thickening including the distal ileum with surrounding fat stranding, and sigmoid diverticulitis.  She was discharged on p.o. Augmentin to follow-up with GI.    Patient was previously of note, she was seen by general surgery, Dr. Dema Severin in 05/2021 for surgical opinion on colovaginal fistula and recurrent diverticulitis that was on hold pending colonoscopy.   On admission, patient was started on IV ceftriaxone, IV Flagyl and IV fluid.  General surgery following.  Subjective: Seen and examined earlier this morning.  No major events overnight of this morning.  Reports improvement in her pain and diarrhea.  She denies nausea or vomiting.  She likes to try full liquid diet.  Objective: Vitals:   07/03/21 1521 07/03/21 2001 07/04/21 0418 07/04/21 0736  BP: (!) 141/97 (!) 156/110 (!) 156/94 (!) 147/94  Pulse: 74 81 83 87  Resp: _0 Temp: 97.8 F (36.6 C) 97.8 F (36.6 C) 98.2 F (36.8 C) 98 F (36.7 C)  TempSrc: Oral Oral Oral Oral  SpO2: 99% 98% 93% 100%  Weight:      Height:        Intake/Output Summary (Last 24 hours) at 07/04/2021 1529 Last data filed at 07/04/2021 1327 Gross per 24 hour  Intake 2350 ml  Output --  Net 2350 ml   Filed Weights   07/03/21 0134  Weight: 94.3 kg    Examination:  GENERAL: No apparent distress.  Nontoxic. HEENT:  MMM.  Vision and hearing grossly intact.  NECK: Supple.  No apparent JVD.  RESP: 100% on RA.  No IWOB.  Fair aeration bilaterally. CVS:  RRR. Heart sounds normal.  ABD/GI/GU: BS+. Abd soft.  LUQ tenderness.  No rebound or guarding. MSK/EXT:  Moves extremities. No apparent deformity. No edema.  SKIN: no apparent skin lesion or wound NEURO: Awake and alert. Oriented appropriately.  No apparent focal neuro deficit. PSYCH: Calm. Normal affect.   Procedures:  None  Microbiology summarized: [id number] and influenza PCR nonreactive.  Assessment & Plan: Acute recurrent diverticulitis-did not improve with p.o. antibiotic outpatient.  Seems to be improving. Colovaginal fistula: POA -10/8-CT A/P with bowel wall thickening including the distal ileum with surrounding fat stranding, and sigmoid diverticulitis but no abscess. -Continue IV CTX and IV Flagyl -General surgery following-no plan for surgical intervention while in-house. -Continue IVF and as needed analgesics -Advance to full liquid diet.   History of Crohn's disease? Patient denies ever having colonoscopy but diagnosis of Crohn's disease.  Not on medication.  CT finding concerning.  CRP and ESR not impressive. -Outpatient follow-up with GI-already connected.  Normocytic anemia: Relatively stable. Recent Labs    02/11/21 1500 03/31/21 1820 04/02/21 0901 06/30/21 1638 07/02/21 1821 07/03/21 0037 07/04/21 0100  HGB 12.4 13.0 12.8 13.0 12.3 11.7* 11.6*  -Continue monitoring   Essential hypertension: BP elevated.  Not on meds. -Start low-dose amlodipine  Hypokalemia: Resolved.  Tobacco use disorder -Couselled and encouraged cessation  -Nicotine patch   GERD -PPI  Social work consult for PCP.   Class I obesity Body mass index is 33.55 kg/m.         DVT prophylaxis:  SCDs Start: 07/02/21 1844  Code Status: Full code Family Communication: Updated patient's husband at bedside. Level of care: Med-Surg Status is:  Inpatient  Remains inpatient appropriate because: Need for IV fluid and IV antibiotics for recurrent diverticulitis, electrolyte derangement   Final disposition: Likely home in the next 2 to 3 days.   Consultants:  General surgery   Sch Meds:  Scheduled Meds:  amLODipine  5 mg Oral Daily   nicotine  14 mg Transdermal Daily   pantoprazole  40 mg Oral Daily   Continuous Infusions:  cefTRIAXone (ROCEPHIN)  IV 2 g (07/03/21 2009)   lactated ringers 75 mL/hr at 07/04/21 1439   metronidazole 500 mg (07/04/21 1040)   PRN Meds:.acetaminophen **OR** acetaminophen, alum & mag hydroxide-simeth, HYDROcodone-acetaminophen, morphine injection, ondansetron (ZOFRAN) IV  Antimicrobials: Anti-infectives (From admission, onward)    Start     Dose/Rate Route Frequency Ordered Stop   07/02/21 2030  metroNIDAZOLE (FLAGYL) IVPB 500 mg        500 mg 100 mL/hr over 60 Minutes Intravenous Every 12 hours 07/02/21 1856     07/02/21 2000  cefTRIAXone (ROCEPHIN) 2 g in sodium chloride 0.9 % 100 mL IVPB        2 g 200 mL/hr over 30 Minutes Intravenous Every 24 hours 07/02/21 1842     07/02/21 1930  metroNIDAZOLE (FLAGYL) IVPB 500 mg  Status:  Discontinued        500 mg 100 mL/hr over 60 Minutes Intravenous Every 8 hours 07/02/21 1842 07/02/21 1856        I have personally reviewed the following labs and images: CBC: Recent Labs  Lab 06/30/21 1638 07/02/21 1821 07/03/21 0037 07/04/21 0100  WBC 10.3 7.0 6.9 5.5  NEUTROABS 6.3 4.2  --   --   HGB 13.0 12.3 11.7* 11.6*  HCT 40.9 38.1 37.1 36.5  MCV 67.8* 67.3* 68.1* 67.7*  PLT 321 253 235 245   BMP &GFR Recent Labs  Lab 06/30/21 1638 07/02/21 1821 07/03/21 0037 07/04/21 0100  NA 140 137 140 138  K 3.6 3.3* 3.1* 4.0  CL 109 107 106 107  CO2 _0 GLUCOSE 113* 103* 98 89  BUN 12 7 5* <5*  CREATININE 0.66 0.71 0.63 0.64  CALCIUM 9.2 8.8* 8.6* 8.7*  MG  --   --   --  1.8  PHOS  --   --   --  3.3   Estimated Creatinine  Clearance: 96.3 mL/min (by C-G formula based on SCr of 0.64 mg/dL). Liver & Pancreas: Recent Labs  Lab 06/30/21 1638 07/02/21 1821 07/04/21 0100  AST 16 32  --   ALT 16 26  --   ALKPHOS 72 72  --   BILITOT 0.4 0.4  --   PROT 7.7 6.5  --   ALBUMIN 4.1 3.4* 3.2*   Recent Labs  Lab 06/30/21 1638  LIPASE 25   No results for input(s): AMMONIA in the last 168 hours. Diabetic: Recent Labs    07/03/21 0037  HGBA1C 5.8*   No results for input(s): GLUCAP in the last 168 hours. Cardiac Enzymes: No results for input(s): CKTOTAL, CKMB, CKMBINDEX, TROPONINI in the last 168 hours. No results for input(s): PROBNP in the last 8760  hours. Coagulation Profile: No results for input(s): INR, PROTIME in the last 168 hours. Thyroid Function Tests: No results for input(s): TSH, T4TOTAL, FREET4, T3FREE, THYROIDAB in the last 72 hours. Lipid Profile: No results for input(s): CHOL, HDL, LDLCALC, TRIG, CHOLHDL, LDLDIRECT in the last 72 hours. Anemia Panel: Recent Labs    07/04/21 0100  VITAMINB12 839  FOLATE 8.6  FERRITIN 177  TIBC 284  IRON 50  RETICCTPCT 1.5   Urine analysis:    Component Value Date/Time   COLORURINE YELLOW 07/01/2021 0020   APPEARANCEUR CLEAR 07/01/2021 0020   LABSPEC >1.046 (H) 07/01/2021 0020   PHURINE 5.0 07/01/2021 0020   GLUCOSEU NEGATIVE 07/01/2021 0020   HGBUR NEGATIVE 07/01/2021 0020   BILIRUBINUR NEGATIVE 07/01/2021 0020   KETONESUR NEGATIVE 07/01/2021 0020   PROTEINUR NEGATIVE 07/01/2021 0020   UROBILINOGEN 1.0 09/07/2012 0019   NITRITE NEGATIVE 07/01/2021 0020   LEUKOCYTESUR NEGATIVE 07/01/2021 0020   Sepsis Labs: Invalid input(s): PROCALCITONIN, [place]  Microbiology: Recent Results (from the past 240 hour(s))  Resp Panel by RT-PCR (Flu A&B, Covid) Nasopharyngeal Swab     Status: None   Collection Time: 07/02/21  6:02 PM   Specimen: Nasopharyngeal Swab; Nasopharyngeal(NP) swabs in vial transport medium  Result Value Ref Range Status    SARS Coronavirus 2 by RT PCR NEGATIVE NEGATIVE Final    Comment: (NOTE) SARS-CoV-2 target nucleic acids are NOT DETECTED.  The SARS-CoV-2 RNA is generally detectable in upper respiratory specimens during the acute phase of infection. The lowest concentration of SARS-CoV-2 viral copies this assay can detect is 138 copies/mL. A negative result does not preclude SARS-Cov-2 infection and should not be used as the sole basis for treatment or other patient management decisions. A negative result may occur with  improper specimen collection/handling, submission of specimen other than nasopharyngeal swab, presence of viral mutation(s) within the areas targeted by this assay, and inadequate number of viral copies(<138 copies/mL). A negative result must be combined with clinical observations, patient history, and epidemiological information. The expected result is Negative.  Fact Sheet for Patients:  EntrepreneurPulse.com.au  Fact Sheet for Healthcare Providers:  IncredibleEmployment.be  This test is no t yet approved or cleared by the [place] FDA and  has been authorized for detection and/or diagnosis of SARS-CoV-2 by FDA under an Emergency Use Authorization (EUA). This EUA will remain  in effect (meaning this test can be used) for the duration of the COVID-19 declaration under Section 564(b)(1) of the Act, 21 U.S.C.section 360bbb-3(b)(1), unless the authorization is terminated  or revoked sooner.       Influenza A by PCR NEGATIVE NEGATIVE Final   Influenza B by PCR NEGATIVE NEGATIVE Final    Comment: (NOTE) The Xpert Xpress SARS-CoV-2/FLU/RSV plus assay is intended as an aid in the diagnosis of influenza from Nasopharyngeal swab specimens and should not be used as a sole basis for treatment. Nasal washings and aspirates are unacceptable for Xpert Xpress SARS-CoV-2/FLU/RSV testing.  Fact Sheet for  Patients: EntrepreneurPulse.com.au  Fact Sheet for Healthcare Providers: IncredibleEmployment.be  This test is not yet approved or cleared by the [place] FDA and has been authorized for detection and/or diagnosis of SARS-CoV-2 by FDA under an Emergency Use Authorization (EUA). This EUA will remain in effect (meaning this test can be used) for the duration of the COVID-19 declaration under Section 564(b)(1) of the Act, 21 U.S.C. section 360bbb-3(b)(1), unless the authorization is terminated or revoked.  Performed at [facility], [place] [address] [place], [place]  [place]     Radiology Studies: No results found.    Nazareth Kirk T. Jessup  If 7PM-7AM, please contact night-coverage [email or web address] 07/04/2021, 3:29 PM

## 2021-07-05 ENCOUNTER — Encounter (HOSPITAL_COMMUNITY): Payer: Self-pay | Admitting: *Deleted

## 2021-07-05 MED ORDER — AMLODIPINE BESYLATE 10 MG PO TABS
10.0000 mg | ORAL_TABLET | Freq: Every day | ORAL | Status: DC
  Administered 2021-07-05: 10 mg via ORAL
  Filled 2021-07-05: qty 1

## 2021-07-05 MED ORDER — PANTOPRAZOLE SODIUM 40 MG PO TBEC: 40.0000 mg | DELAYED_RELEASE_TABLET | Freq: Every day | ORAL | 0 refills | Status: DC

## 2021-07-05 MED ORDER — AMLODIPINE BESYLATE 10 MG PO TABS: 10.0000 mg | ORAL_TABLET | Freq: Every day | ORAL | 3 refills | Status: DC

## 2021-07-05 MED ORDER — NICOTINE 14 MG/24HR TD PT24
14.0000 mg | MEDICATED_PATCH | Freq: Every day | TRANSDERMAL | 0 refills | Status: DC
Start: 2021-07-06 — End: 2021-08-05

## 2021-07-05 NOTE — Progress Notes (Signed)
Discharge instructions reviewed with pt and instructed on where to pick up prescriptions. Pt verbalized understanding and had no questions.  Pt discharged in stable condition with husband.  Amanda Garner

## 2021-07-05 NOTE — Discharge Summary (Signed)
Physician Discharge Summary  Amanda Garner LKT:[id number] DOB: Oct 15, 1970 DOA: 07/02/2021  PCP: Pcp, No  Admit date: 07/02/2021 Discharge date: 07/05/2021 Admitted From: Home Disposition: Home Recommendations for Outpatient Follow-up:  Follow ups as below. Please obtain CBC/BMP/Mag at follow up Patient to follow-up with gastroenterology for colonoscopy Patient to follow-up with general surgery to address colovaginal fistula. Please follow up on the following pending results: None Home Health: Not indicated Equipment/Devices: Not indicated Discharge Condition: Stable CODE STATUS: Full code  Hospital Course: 50 year old F with history of recurrent diverticulitis, colovaginal fistula, Crohn's disease (not on meds), HTN, GERD, tobacco use disorder and remote hysterectomy directed to ED from GI office for LLQ pain and poor p.o. intake due to diverticulitis that did not improve with p.o. Augmentin outpatient.  Seen in ED on 11/8, and had CT abdomen with bowel wall thickening including the distal ileum with surrounding fat stranding, and sigmoid diverticulitis.  She was discharged on p.o. Augmentin to follow-up with GI.     Patient was previously of note, she was seen by general surgery, Dr. Dema Severin in 05/2021 for surgical opinion on colovaginal fistula and recurrent diverticulitis that was on hold pending colonoscopy.    On admission, patient was started on IV ceftriaxone, IV Flagyl and IV fluid.  Consulted and recommended medical management with antibiotics.  Patient improved.  Tolerated full liquid diet and felt well and ready to go home.  She is discharged on p.o. Augmentin.  She already have prescription for this from recent ED visit.  Patient to follow-up with a gastroenterologist for colonoscopy, then general surgery to address her colovaginal fistula.   See individual problem list below for more on hospital course.  Discharge Diagnoses:  Acute recurrent diverticulitis-did not  improve with p.o. antibiotic outpatient.  Improved. Colovaginal fistula: POA -10/8-CT with bowel wall thickening at distal ileum with fat stranding and sigmoid diverticulitis but no abscess. -Continue IV CTX and IV Flagyl>> IV Unasyn.  Discharge on p.o. Augmentin to complete 10 days course -General surgery following-no plan for surgical intervention while in-house. -Patient to continue full liquid diet for the next 2 to 3 days and slowly advance to soft texture diet. -Patient to follow-up with a gastroenterologist and general surgery outpatient.   History of Crohn's disease? Patient denies ever having colonoscopy but diagnosis of Crohn's disease.  Not on medication.  CT finding concerning.  CRP and ESR not impressive. -Outpatient follow-up with GI-already connected.   Normocytic anemia: Relatively stable. Recent Labs    02/11/21 1500 03/31/21 1820 04/02/21 0901 06/30/21 1638 07/02/21 1821 07/03/21 0037 07/04/21 0100  HGB 12.4 13.0 12.8 13.0 12.3 11.7* 11.6*  -Repeat CBC at follow-up.  Essential hypertension: BP elevated partly from IV fluid.  Used to be on amlodipine. -Discharged on amlodipine 10 mg daily   Hypokalemia: Resolved.   Tobacco use disorder: Reports smoking about 5 cigarettes a day. -Couselled, encourage cessation and provided with resources. -Encouraged to use nicotine patch if needed   GERD -Gave Rx for Protonix 40 mg daily   PCP need -TOC consulted on admission for assistance.   Class I obesity Body mass index is 33.55 kg/m.           Discharge Exam: Vitals:   07/04/21 1547 07/04/21 2001 07/05/21 0417 07/05/21 0735  BP: 126/88 (!) 142/100 (!) 158/101 (!) 150/90  Pulse: 79 86 86 84  Temp: 98.5 F (36.9 C) 98.4 F (36.9 C) 98.6 F (37 C) 97.7 F (36.5 C)  Resp: _0 17  Height:      Weight:      SpO2: 98% 100% 97% 97%  TempSrc: Oral Oral Oral   BMI (Calculated):         GENERAL: No apparent distress.  Nontoxic. HEENT: MMM.  Vision  and hearing grossly intact.  NECK: Supple.  No apparent JVD.  RESP:  No IWOB.  Fair aeration bilaterally. CVS:  RRR. Heart sounds normal.  ABD/GI/GU: Bowel sounds present. Soft. slight discomfort over LUQ.  No rebound or guarding..  MSK/EXT:  Moves extremities. No apparent deformity. No edema.  SKIN: no apparent skin lesion or wound NEURO: Awake and alert.  Oriented appropriately.  No apparent focal neuro deficit. PSYCH: Calm. Normal affect.   Discharge Instructions  Discharge Instructions     Call MD for:  extreme fatigue   Complete by: As directed    Call MD for:  persistant dizziness or light-headedness   Complete by: As directed    Call MD for:  persistant nausea and vomiting   Complete by: As directed    Call MD for:  severe uncontrolled pain   Complete by: As directed    Call MD for:  temperature >100.4   Complete by: As directed    Diet - low sodium heart healthy   Complete by: As directed    Discharge instructions   Complete by: As directed    It has been a pleasure taking care of you!  You were hospitalized with an acute diverticulitis (infection of your colon).  You have been treated with antibiotics and your symptoms improved to the point we think it is safe to let you go home and complete the antibiotic course.  Continue Augmentin until to finish the whole course.  We also recommend you continue full liquid diet for the next 2 to 3 days, and slowly advance to soft texture diet after that.  Call your gastroenterologist office to schedule a follow-up appointment for your colonoscopy.  After that, you can follow-up with your general surgeon for surgical evaluation and treatment.  It is important that you quit smoking cigarettes.  You may use nicotine patch to help you quit smoking.  Nicotine patch is available over-the-counter.  You may also discuss other options to help you quit smoking with your primary care doctor. You can also talk to professional counselors at  1-800-QUIT-NOW (209)594-6539) for free smoking cessation counseling.     Take care,   Increase activity slowly   Complete by: As directed       Allergies as of 07/05/2021   No Known Allergies      Medication List     TAKE these medications    amLODipine 10 MG tablet Commonly known as: NORVASC Take 1 tablet (10 mg total) by mouth daily. Start taking on: July 06, 2021   amoxicillin-clavulanate 875-125 MG tablet Commonly known as: AUGMENTIN Take 1 tablet by mouth every 12 (twelve) hours.   HYDROcodone-acetaminophen 5-325 MG tablet Commonly known as: NORCO/VICODIN Take 1-2 tablets by mouth every 6 (six) hours as needed for severe pain.   nicotine 14 mg/24hr patch Commonly known as: NICODERM CQ - dosed in mg/24 hours Place 1 patch (14 mg total) onto the skin daily. Start taking on: July 06, 2021   ondansetron 4 MG disintegrating tablet Commonly known as: Zofran ODT Take 1 tablet (4 mg total) by mouth every 8 (eight) hours as needed for nausea or vomiting.   pantoprazole 40 MG tablet Commonly known as: Protonix Take 1 tablet (40  mg total) by mouth daily.        Consultations: General surgery  Procedures/Studies:   CT Abdomen Pelvis W Contrast  Result Date: 06/30/2021 CLINICAL DATA:  Left lower quadrant and periumbilical abdominal pain. EXAM: CT ABDOMEN AND PELVIS WITH CONTRAST TECHNIQUE: Multidetector CT imaging of the abdomen and pelvis was performed using the standard protocol following bolus administration of intravenous contrast. CONTRAST:  77m OMNIPAQUE IOHEXOL 350 MG/ML SOLN COMPARISON:  04/01/2021. FINDINGS: Lower chest: Subsegmental atelectasis or scarring is noted in the left lower lobe Hepatobiliary: No focal liver abnormality is seen. No gallstones, gallbladder wall thickening, or biliary dilatation. Pancreas: Unremarkable. No pancreatic ductal dilatation or surrounding inflammatory changes. Spleen: Normal in size without focal abnormality.  Adrenals/Urinary Tract: Adrenal glands are unremarkable. Kidneys are normal, without renal calculi, focal lesion, or hydronephrosis. The urinary bladder is partially distended and abuts the sigmoid colon, similar in appearance to the prior exam. Stomach/Bowel: There is a small hiatal hernia. No bowel obstruction, free air, or pneumatosis. There is bowel wall thickening with surrounding fat stranding and a small amount of free fluid in the mesenteric folds involving the mid to distal ileum in the lower abdomen and pelvis. Scattered diverticula present along the colon. There is bowel wall thickening at the sigmoid colon with mild surrounding inflammatory changes. No abscess is identified. The appendix is not visualized on exam. Vascular/Lymphatic: No significant vascular findings are present. No enlarged abdominal or pelvic lymph nodes. Reproductive: The uterus is not visualized on exam. Other: A small amount of free fluid is noted in the cul-de-sac. There is diastasis of the rectus abdominus with a broad-based fat containing umbilical hernia. Musculoskeletal: Degenerative changes are present in the thoracic spine. No acute osseous abnormality. IMPRESSION: 1. Bowel wall thickening involving the distal ileum in the mid to lower abdomen and pelvis with surrounding fat stranding, suggesting infectious or inflammatory enteritis. 2. Colonic diverticulosis with bowel wall thickening and mild surrounding fat stranding at the sigmoid colon, suggesting diverticulitis. No abscess or free air. 3. Small hiatal hernia. 4. Remaining findings as described above. Electronically Signed   By: LBrett FairyM.D.   On: 06/30/2021 23:31       The results of significant diagnostics from this hospitalization (including imaging, microbiology, ancillary and laboratory) are listed below for reference.     Microbiology: Recent Results (from the past 240 hour(s))  Resp Panel by RT-PCR (Flu A&B, Covid) Nasopharyngeal Swab     Status:  None   Collection Time: 07/02/21  6:02 PM   Specimen: Nasopharyngeal Swab; Nasopharyngeal(NP) swabs in vial transport medium  Result Value Ref Range Status   SARS Coronavirus 2 by RT PCR NEGATIVE NEGATIVE Final    Comment: (NOTE) SARS-CoV-2 target nucleic acids are NOT DETECTED.  The SARS-CoV-2 RNA is generally detectable in upper respiratory specimens during the acute phase of infection. The lowest concentration of SARS-CoV-2 viral copies this assay can detect is 138 copies/mL. A negative result does not preclude SARS-Cov-2 infection and should not be used as the sole basis for treatment or other patient management decisions. A negative result may occur with  improper specimen collection/handling, submission of specimen other than nasopharyngeal swab, presence of viral mutation(s) within the areas targeted by this assay, and inadequate number of viral copies(<138 copies/mL). A negative result must be combined with clinical observations, patient history, and epidemiological information. The expected result is Negative.  Fact Sheet for Patients:  hEntrepreneurPulse.com.au Fact Sheet for Healthcare Providers:  hIncredibleEmployment.be This test is no  t yet approved or cleared by the [place] and  has been authorized for detection and/or diagnosis of SARS-CoV-2 by FDA under an Emergency Use Authorization (EUA). This EUA will remain  in effect (meaning this test can be used) for the duration of the COVID-19 declaration under Section 564(b)(1) of the Act, 21 U.S.C.section 360bbb-3(b)(1), unless the authorization is terminated  or revoked sooner.       Influenza A by PCR NEGATIVE NEGATIVE Final   Influenza B by PCR NEGATIVE NEGATIVE Final    Comment: (NOTE) The Xpert Xpress SARS-CoV-2/FLU/RSV plus assay is intended as an aid in the diagnosis of influenza from Nasopharyngeal swab specimens and should not be used as a sole basis for  treatment. Nasal washings and aspirates are unacceptable for Xpert Xpress SARS-CoV-2/FLU/RSV testing.  Fact Sheet for Patients: EntrepreneurPulse.com.au  Fact Sheet for Healthcare Providers: IncredibleEmployment.be  This test is not yet approved or cleared by the [place] FDA and has been authorized for detection and/or diagnosis of SARS-CoV-2 by FDA under an Emergency Use Authorization (EUA). This EUA will remain in effect (meaning this test can be used) for the duration of the COVID-19 declaration under Section 564(b)(1) of the Act, 21 U.S.C. section 360bbb-3(b)(1), unless the authorization is terminated or revoked.  Performed at [facility], [employer] [address], [place], [place] [address]      Labs:  CBC: Recent Labs  Lab 06/30/21 1638 07/02/21 1821 07/03/21 0037 07/04/21 0100  WBC 10.3 7.0 6.9 5.5  NEUTROABS 6.3 4.2  --   --   HGB 13.0 12.3 11.7* 11.6*  HCT 40.9 38.1 37.1 36.5  MCV 67.8* 67.3* 68.1* 67.7*  PLT 321 253 235 245   BMP &GFR Recent Labs  Lab 06/30/21 1638 07/02/21 1821 07/03/21 0037 07/04/21 0100  NA 140 137 140 138  K 3.6 3.3* 3.1* 4.0  CL 109 107 106 107  CO2 _0 GLUCOSE 113* 103* 98 89  BUN 12 7 5* <5*  CREATININE 0.66 0.71 0.63 0.64  CALCIUM 9.2 8.8* 8.6* 8.7*  MG  --   --   --  1.8  PHOS  --   --   --  3.3   Estimated Creatinine Clearance: 96.3 mL/min (by C-G formula based on SCr of 0.64 mg/dL). Liver & Pancreas: Recent Labs  Lab 06/30/21 1638 07/02/21 1821 07/04/21 0100  AST 16 32  --   ALT 16 26  --   ALKPHOS 72 72  --   BILITOT 0.4 0.4  --   PROT 7.7 6.5  --   ALBUMIN 4.1 3.4* 3.2*   Recent Labs  Lab 06/30/21 1638  LIPASE 25   No results for input(s): AMMONIA in the last 168 hours. Diabetic: Recent Labs    07/03/21 0037  HGBA1C 5.8*   No results for input(s): GLUCAP in the last 168 hours. Cardiac Enzymes: No results for input(s): CKTOTAL, CKMB, CKMBINDEX,  TROPONINI in the last 168 hours. No results for input(s): PROBNP in the last 8760 hours. Coagulation Profile: No results for input(s): INR, PROTIME in the last 168 hours. Thyroid Function Tests: No results for input(s): TSH, T4TOTAL, FREET4, T3FREE, THYROIDAB in the last 72 hours. Lipid Profile: No results for input(s): CHOL, HDL, LDLCALC, TRIG, CHOLHDL, LDLDIRECT in the last 72 hours. Anemia Panel: Recent Labs    07/04/21 0100  VITAMINB12 839  FOLATE 8.6  FERRITIN 177  TIBC 284  IRON 50  RETICCTPCT 1.5   Urine analysis:  Component Value Date/Time   COLORURINE YELLOW 07/01/2021 0020   APPEARANCEUR CLEAR 07/01/2021 0020   LABSPEC >1.046 (H) 07/01/2021 0020   PHURINE 5.0 07/01/2021 0020   GLUCOSEU NEGATIVE 07/01/2021 0020   HGBUR NEGATIVE 07/01/2021 0020   BILIRUBINUR NEGATIVE 07/01/2021 0020   KETONESUR NEGATIVE 07/01/2021 0020   PROTEINUR NEGATIVE 07/01/2021 0020   UROBILINOGEN 1.0 09/07/2012 0019   NITRITE NEGATIVE 07/01/2021 0020   LEUKOCYTESUR NEGATIVE 07/01/2021 0020   Sepsis Labs: Invalid input(s): PROCALCITONIN, LACTICIDVEN   Time coordinating discharge: 45 minutes  SIGNED:  Mercy Riding, MD  [facility] 07/05/2021, 2:32 PM

## 2021-07-06 NOTE — Progress Notes (Signed)
Addendum: Reviewed and agree with assessment and management plan. Patient was admitted and seen by colorectal surgery.  She has follow-up with Dr. Dema Severin for plans of surgical management. The question of underlying Crohn's disease remains but I will wait until she sees Dr. Dema Severin and the surgical plan is determined.  We can discuss colonoscopy before or after surgery depending on overall clinical course. Anicia Leuthold, Lajuan Lines, MD

## 2021-07-15 ENCOUNTER — Ambulatory Visit: Payer: Self-pay | Admitting: Gastroenterology

## 2021-07-26 IMAGING — DX DG CHEST 1V PORT
1 series · 1 of 1 positions shown · non-contrast
Comparison: Single-view of the chest 09/18/2019.

CLINICAL DATA: COVID positive patient.

EXAM:
PORTABLE CHEST 1 VIEW

[chest]
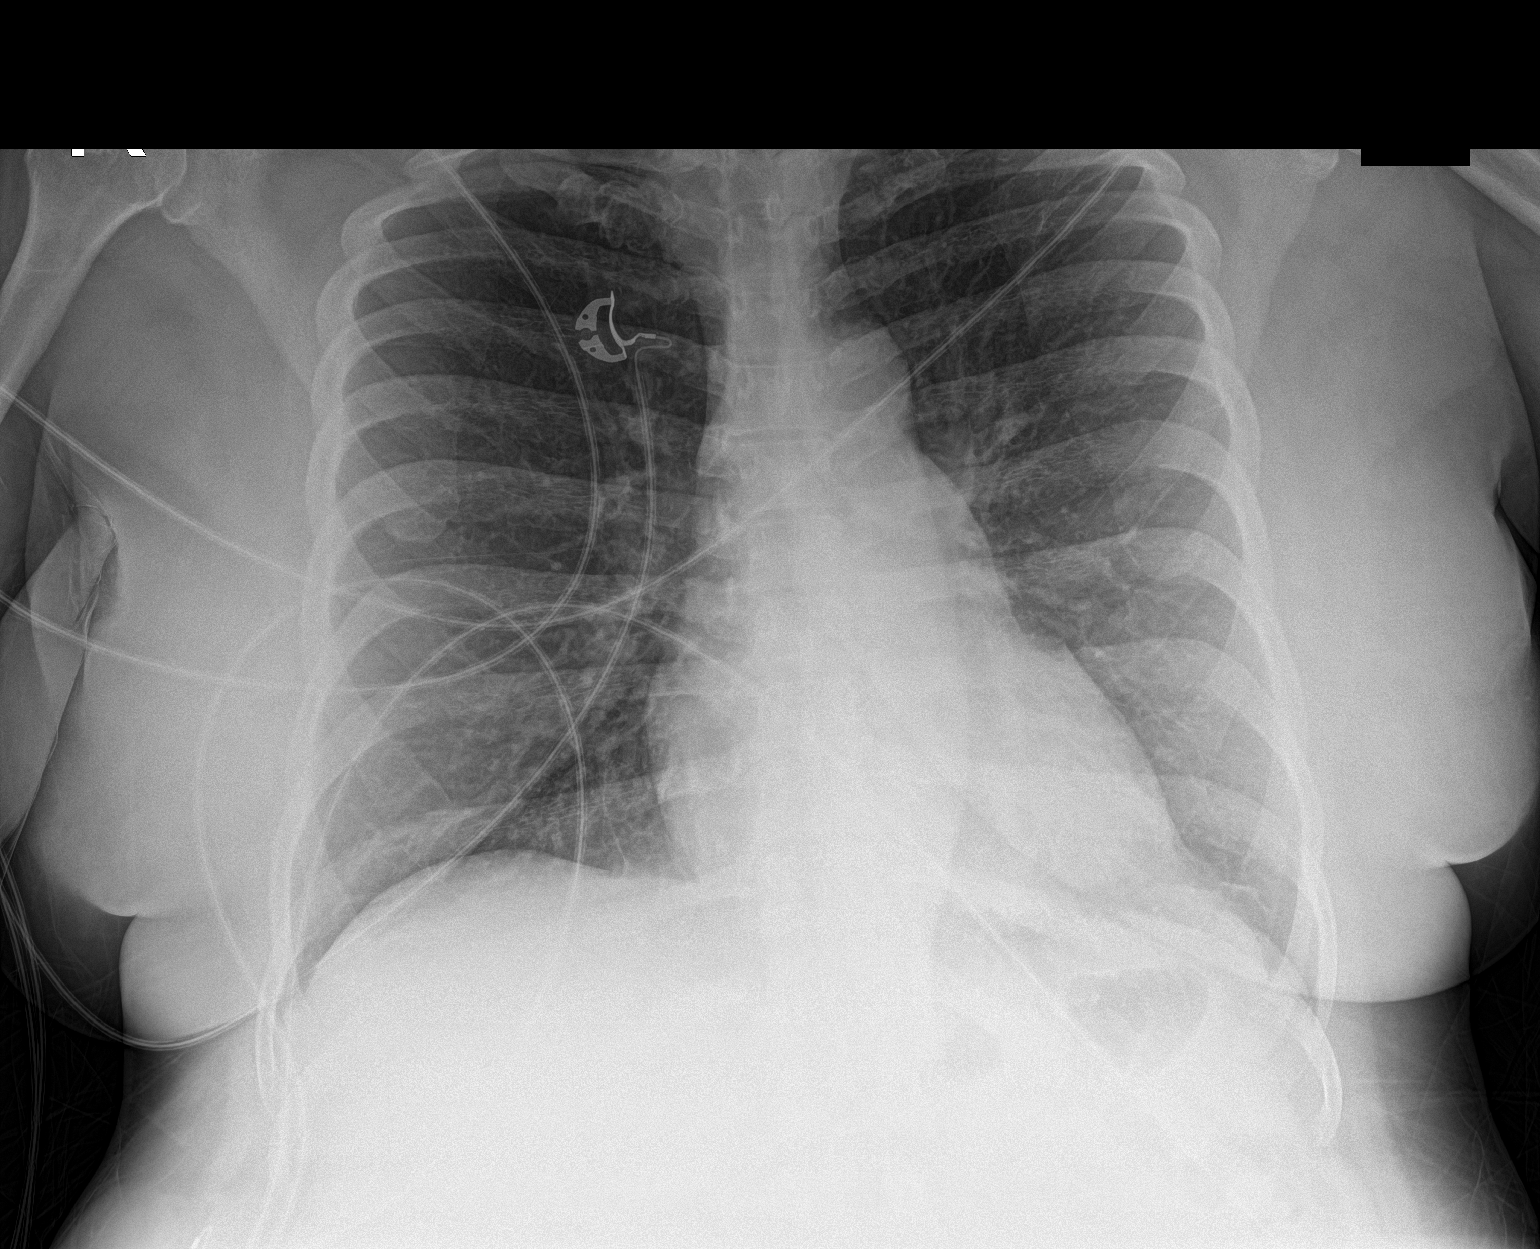

[1 of 1 positions shown; findings below may reference images not displayed]

FINDINGS: Lungs clear. Heart size normal. No pneumothorax or pleural effusion.
No acute or focal bony abnormality.
IMPRESSION: Negative chest.

## 2021-08-05 ENCOUNTER — Ambulatory Visit (INDEPENDENT_AMBULATORY_CARE_PROVIDER_SITE_OTHER): Payer: 59 | Admitting: Gastroenterology

## 2021-08-05 ENCOUNTER — Encounter: Payer: Self-pay | Admitting: Gastroenterology

## 2021-08-05 VITALS — BP 134/82 | HR 84 | Ht 66.0 in | Wt 209.1 lb

## 2021-08-05 DIAGNOSIS — K529 Noninfective gastroenteritis and colitis, unspecified: Secondary | ICD-10-CM | POA: Diagnosis not present

## 2021-08-05 DIAGNOSIS — Z8719 Personal history of other diseases of the digestive system: Secondary | ICD-10-CM | POA: Diagnosis not present

## 2021-08-05 DIAGNOSIS — R9389 Abnormal findings on diagnostic imaging of other specified body structures: Secondary | ICD-10-CM

## 2021-08-05 MED ORDER — SUTAB 1479-225-188 MG PO TABS: 1.0000 | ORAL_TABLET | ORAL | 0 refills | Status: DC

## 2021-08-05 NOTE — Patient Instructions (Signed)
If you are age 51 or younger, your body mass index should be between 19-25. Your Body mass index is 33.75 kg/m. If this is out of the aformentioned range listed, please consider follow up with your Primary Care Provider.   The [facility] providers would like to encourage you to use [facility] to communicate with providers for non-urgent requests or questions.  Due to long hold times on the telephone, sending your provider a message by [facility] may be faster and more efficient way to get a response. Please allow 48 business hours for a response.  Please remember that this is for non-urgent requests/questions.  PROCEDURES: You have been scheduled for a colonoscopy. Please follow the written instructions given to you at your visit today. Please pick up your prep supplies at the pharmacy within the next 1-3 days. If you use inhalers (even only as needed), please bring them with you on the day of your procedure.  It was great seeing you today! Thank you for entrusting me with your care and choosing [facility].  Alonza Bogus, PA-C

## 2021-08-05 NOTE — Progress Notes (Signed)
08/05/2021 Amanda Garner [id number] 1971-07-07   HISTORY OF PRESENT ILLNESS: This is a 50 year old female who is a patient of Dr. Vena Rua.  She has past medical history of recurrent diverticulitis complicated previously by abscess.  She was supposed to have a colonoscopy in 2018 and it was likely going to be followed by an elective surgical resection, but unfortunately she was never able to have this performed and has continued to have recurrent episodes of diverticulitis.  Now has a colovaginal fistula.  She was seen by surgery, Dr. Dema Severin, and they plan to do surgical intervention, but would like her to have a colonoscopy first.  I saw her on 07/02/2021 and she was having a lot of abdominal pain/acute diverticulitis.  I was able to get her directly admitted and she spent 3 days in the hospital where she was once again seen by surgery.  She got some IV antibiotics and then was discharged home with oral antibiotics.  She just completed those last week.  She says that she is feeling much better.  Minimal to no pain, but is having a lot more stool coming from her vagina.  Most recent CT san was from 07/02/2021:  IMPRESSION: 1. Bowel wall thickening involving the distal ileum in the mid to lower abdomen and pelvis with surrounding fat stranding, suggesting infectious or inflammatory enteritis. 2. Colonic diverticulosis with bowel wall thickening and mild surrounding fat stranding at the sigmoid colon, suggesting diverticulitis. No abscess or free air. 3. Small hiatal hernia. 4. Remaining findings as described above.   Past Medical History:  Diagnosis Date   Acid reflux    Anemia Dx: 2001 or so    Blood transfusion    Crohn disease ([place])    Crohn's disease ([place])    Diverticulitis    Hypertension    Sickle cell trait ([place])    Past Surgical History:  Procedure Laterality Date   ABDOMINAL HYSTERECTOMY  2001   partial hysterectomy - emergency during last C-section   [place]; 1997; 2001   [place]      reports that she has been smoking cigarettes. She has a 15.50 pack-year smoking history. She has never used smokeless tobacco. She reports current alcohol use. She reports that she does not use drugs. family history includes Diabetes in her mother; Diabetes type II in her father and mother; Hypertension in her mother; Lung cancer (age of onset: 2) in her father; Stroke in her father. No Known Allergies    Outpatient Encounter Medications as of 08/05/2021  Medication Sig   amLODipine (NORVASC) 10 MG tablet Take 1 tablet (10 mg total) by mouth daily.   pantoprazole (PROTONIX) 40 MG tablet Take 1 tablet (40 mg total) by mouth daily.   [DISCONTINUED] amoxicillin-clavulanate (AUGMENTIN) 875-125 MG tablet Take 1 tablet by mouth every 12 (twelve) hours. (Patient not taking: Reported on 08/05/2021)   [DISCONTINUED] HYDROcodone-acetaminophen (NORCO/VICODIN) 5-325 MG tablet Take 1-2 tablets by mouth every 6 (six) hours as needed for severe pain. (Patient not taking: Reported on 08/05/2021)   [DISCONTINUED] nicotine (NICODERM CQ - DOSED IN MG/24 HOURS) 14 mg/24hr patch Place 1 patch (14 mg total) onto the skin daily. (Patient not taking: Reported on 08/05/2021)   [DISCONTINUED] ondansetron (ZOFRAN ODT) 4 MG disintegrating tablet Take 1 tablet (4 mg total) by mouth every 8 (eight) hours as needed for nausea or vomiting. (Patient not taking: Reported on 08/05/2021)   No facility-administered encounter medications on file as of 08/05/2021.  REVIEW OF SYSTEMS  : All other systems reviewed and negative except where noted in the History of Present Illness.   PHYSICAL EXAM: BP 134/82    Pulse 84    Ht 5' 6"  (1.676 m)    Wt 209 lb 2 oz (94.9 kg)    LMP 08/20/2011    SpO2 98%    BMI 33.75 kg/m  General: Well developed AA female in no acute distress Head: Normocephalic and atraumatic Eyes:  Sclerae anicteric, conjunctiva pink. Ears: Normal auditory  acuity  Lungs: Clear throughout to auscultation; no W/R/R. Heart: Regular rate and rhythm; no M/R/G. Abdomen: Soft, non-distended.  BS present.  Non-tender. Rectal:  Will be done at the time of colonoscopy. Musculoskeletal: Symmetrical with no gross deformities  Skin: No lesions on visible extremities Extremities: No edema  Neurological: Alert oriented x 4, grossly non-focal Psychological:  Alert and cooperative. Normal mood and affect  ASSESSMENT AND PLAN: *50 year old female with concern for recurrent diverticulitis, but also some findings of enteritis on CT scan/question Crohn's disease.  Previously complicated by abscess and also now has a colovaginal fistula.  Just completed antibiotics last week.  Needs colonoscopy before undergoing surgery with Dr. Dema Severin.  We will plan for colonoscopy with Dr. Hilarie Fredrickson on January 6, which will be 4 weeks from completion of her antibiotics.  The risks, benefits, and alternatives to colonoscopy were discussed with the patient and she consents to proceed.    CC:  No ref. provider found

## 2021-08-06 NOTE — Progress Notes (Signed)
Addendum: Reviewed and agree with assessment and management plan. Macyn Remmert M, MD  

## 2021-08-12 ENCOUNTER — Ambulatory Visit: Payer: MEDICAID | Attending: Family Medicine | Admitting: Family Medicine

## 2021-08-12 ENCOUNTER — Encounter: Payer: Self-pay | Admitting: Family Medicine

## 2021-08-12 ENCOUNTER — Other Ambulatory Visit: Payer: Self-pay

## 2021-08-12 VITALS — BP 114/79 | HR 88 | Ht 66.0 in | Wt 210.4 lb

## 2021-08-12 DIAGNOSIS — K219 Gastro-esophageal reflux disease without esophagitis: Secondary | ICD-10-CM | POA: Diagnosis not present

## 2021-08-12 DIAGNOSIS — I10 Essential (primary) hypertension: Secondary | ICD-10-CM | POA: Diagnosis not present

## 2021-08-12 DIAGNOSIS — L600 Ingrowing nail: Secondary | ICD-10-CM

## 2021-08-12 DIAGNOSIS — Z23 Encounter for immunization: Secondary | ICD-10-CM

## 2021-08-12 DIAGNOSIS — K50114 Crohn's disease of large intestine with abscess: Secondary | ICD-10-CM | POA: Diagnosis not present

## 2021-08-12 DIAGNOSIS — Z1159 Encounter for screening for other viral diseases: Secondary | ICD-10-CM

## 2021-08-12 DIAGNOSIS — Z1231 Encounter for screening mammogram for malignant neoplasm of breast: Secondary | ICD-10-CM

## 2021-08-12 MED ORDER — PANTOPRAZOLE SODIUM 40 MG PO TBEC: 40.0000 mg | DELAYED_RELEASE_TABLET | Freq: Every day | ORAL | 1 refills | Status: DC

## 2021-08-12 MED ORDER — CEPHALEXIN 500 MG PO CAPS: 500.0000 mg | ORAL_CAPSULE | Freq: Two times a day (BID) | ORAL | 0 refills | Status: DC

## 2021-08-12 MED ORDER — AMLODIPINE BESYLATE 10 MG PO TABS: 10.0000 mg | ORAL_TABLET | Freq: Every day | ORAL | 1 refills | Status: DC

## 2021-08-12 NOTE — Progress Notes (Signed)
Subjective:  Patient ID: Amanda Garner, female    DOB: 30-Jun-1971  Age: 50 y.o. MRN: [id number]  CC: Hospitalization Follow-up   HPI Amanda Garner is a 50 y.o. year old female with a history of hypertension, GERD, Crohn's disease, recurrent diverticulitis here to establish care. She had a hospitalization from 06/30/2021 through 07/02/2021 for acute diverticulitis, treated with Augmentin.  Interval History: She reports doing well since discharge and has no abdominal pain. Dr Hilarie Fredrickson is her GI and she has upcoming Colonoscopy after which she will be undergoing colovaginal fistula repair by Gen Surg.  She complains of L big toenail pain and turning Green and it started after she went to the nail salon; pain is rated as 9.5.  Had similar symptoms in her right great toe in the past but this was clipped by podiatry. She also needs medication for heartburn and belching but has no nausea or vomiting.  Compliant with her antihypertensive.  Past Medical History:  Diagnosis Date   Acid reflux    Anemia Dx: 2001 or so    Blood transfusion    Crohn disease ([place])    Crohn's disease ([place])    Diverticulitis    Hypertension    Sickle cell trait ([place])     Past Surgical History:  Procedure Laterality Date   ABDOMINAL HYSTERECTOMY  2001   partial hysterectomy - emergency during last C-section   CESAREAN SECTION  1991; 1997; 2001   CESAREAN SECTION      Family History  Problem Relation Age of Onset   Hypertension Mother    Diabetes Mother    Diabetes type II Mother    Diabetes type II Father    Stroke Father    Lung cancer Father 42   Colon cancer Neg Hx    Esophageal cancer Neg Hx    Pancreatic cancer Neg Hx    Stomach cancer Neg Hx     No Known Allergies  Outpatient Medications Prior to Visit  Medication Sig Dispense Refill   amLODipine (NORVASC) 10 MG tablet Take 1 tablet (10 mg total) by mouth daily. 90 tablet 3   Sodium Sulfate-Mag Sulfate-KCl (SUTAB) [phone] MG  TABS Take 1 kit by mouth as directed. (Patient not taking: Reported on 08/12/2021) 24 tablet 0   pantoprazole (PROTONIX) 40 MG tablet Take 1 tablet (40 mg total) by mouth daily. 30 tablet 0   No facility-administered medications prior to visit.     ROS Review of Systems  Constitutional:  Negative for activity change, appetite change and fatigue.  HENT:  Negative for congestion, sinus pressure and sore throat.   Eyes:  Negative for visual disturbance.  Respiratory:  Negative for cough, chest tightness, shortness of breath and wheezing.   Cardiovascular:  Negative for chest pain and palpitations.  Gastrointestinal:  Negative for abdominal distention, abdominal pain and constipation.  Endocrine: Negative for polydipsia.  Genitourinary:  Negative for dysuria and frequency.  Musculoskeletal:  Negative for arthralgias and back pain.  Skin:  Negative for rash.  Neurological:  Negative for tremors, light-headedness and numbness.  Hematological:  Does not bruise/bleed easily.  Psychiatric/Behavioral:  Negative for agitation and behavioral problems.    Objective:  BP 114/79    Pulse 88    Ht 5' 6"  (1.676 m)    Wt 210 lb 6.4 oz (95.4 kg)    LMP 08/20/2011    SpO2 98%    BMI 33.96 kg/m   BP/Weight 08/12/2021 08/05/2021 41/96/2229  Systolic BP 798 921 194  Diastolic BP 79 82 90  Wt. (Lbs) 210.4 209.13 -  BMI 33.96 33.75 -      Physical Exam Constitutional:      Appearance: She is well-developed.  Cardiovascular:     Rate and Rhythm: Normal rate.     Heart sounds: Normal heart sounds. No murmur heard. Pulmonary:     Effort: Pulmonary effort is normal.     Breath sounds: Normal breath sounds. No wheezing or rales.  Chest:     Chest wall: No tenderness.  Abdominal:     General: Bowel sounds are normal. There is no distension.     Palpations: Abdomen is soft. There is no mass.     Tenderness: There is no abdominal tenderness.  Musculoskeletal:        General: Normal range of  motion.     Right lower leg: No edema.     Left lower leg: No edema.     Comments: Thickened left great toenail with tenderness and superficial aspect of toe.  No erythema noted  Neurological:     Mental Status: She is alert and oriented to person, place, and time.  Psychiatric:        Mood and Affect: Mood normal.    CMP Latest Ref Rng & Units 07/04/2021 07/03/2021 07/02/2021  Glucose 70 - 99 mg/dL 89 98 103(H)  BUN 6 - 20 mg/dL <5(L) 5(L) 7  Creatinine 0.44 - 1.00 mg/dL 0.64 0.63 0.71  Sodium 135 - 145 mmol/L 138 140 137  Potassium 3.5 - 5.1 mmol/L 4.0 3.1(L) 3.3(L)  Chloride 98 - 111 mmol/L 107 106 107  CO2 22 - 32 mmol/L 24 25 22   Calcium 8.9 - 10.3 mg/dL 8.7(L) 8.6(L) 8.8(L)  Total Protein 6.5 - 8.1 g/dL - - 6.5  Total Bilirubin 0.3 - 1.2 mg/dL - - 0.4  Alkaline Phos 38 - 126 U/L - - 72  AST 15 - 41 U/L - - 32  ALT 0 - 44 U/L - - 26    Lipid Panel     Component Value Date/Time   CHOL 144 02/11/2020 0915   TRIG 229 (H) 02/11/2020 0915   HDL 40 02/11/2020 0915   CHOLHDL 3.6 02/11/2020 0915   CHOLHDL 3.7 Ratio 11/15/2007 2048   VLDL 16 11/15/2007 2048   LDLCALC 67 02/11/2020 0915    CBC    Component Value Date/Time   WBC 5.5 07/04/2021 0100   RBC 5.39 (H) 07/04/2021 0100   RBC 5.38 (H) 07/04/2021 0100   HGB 11.6 (L) 07/04/2021 0100   HGB 13.0 01/31/2020 0953   HCT 36.5 07/04/2021 0100   HCT 43.0 01/31/2020 0953   PLT 245 07/04/2021 0100   PLT 318 01/31/2020 0953   MCV 67.7 (L) 07/04/2021 0100   MCV 71 (L) 01/31/2020 0953   MCH 21.5 (L) 07/04/2021 0100   MCHC 31.8 07/04/2021 0100   RDW 14.8 07/04/2021 0100   RDW 17.0 (H) 01/31/2020 0953   LYMPHSABS 2.1 07/02/2021 1821   LYMPHSABS 2.8 01/31/2020 0953   MONOABS 0.5 07/02/2021 1821   EOSABS 0.3 07/02/2021 1821   EOSABS 0.3 01/31/2020 0953   BASOSABS 0.0 07/02/2021 1821   BASOSABS 0.0 01/31/2020 0953    Lab Results  Component Value Date   HGBA1C 5.8 (H) 07/03/2021    Assessment & Plan:  1. Crohn's  disease of large intestine with abscess ([place]) Currently not on medications Closely followed by GI  2. Essential hypertension Controlled Counseled on blood pressure goal of less  than 130/80, low-sodium, DASH diet, medication compliance, 150 minutes of moderate intensity exercise per week. Discussed medication compliance, adverse effects. - amLODipine (NORVASC) 10 MG tablet; Take 1 tablet (10 mg total) by mouth daily.  Dispense: 90 tablet; Refill: 1  3. Gastroesophageal reflux disease without esophagitis Uncontrolled Placed on PPI Advised to avoid recumbency up to 2 hours postmeal, avoid late meals, avoid foods that trigger symptoms. - pantoprazole (PROTONIX) 40 MG tablet; Take 1 tablet (40 mg total) by mouth daily.  Dispense: 90 tablet; Refill: 1  4. Need for hepatitis C screening test - HCV Ab w Reflex to Quant PCR  5. Encounter for screening mammogram for malignant neoplasm of breast - MM DIGITAL SCREENING BILATERAL; Future  6. Ingrown left big toenail Advised to use warm soaks - cephALEXin (KEFLEX) 500 MG capsule; Take 1 capsule (500 mg total) by mouth 2 (two) times daily.  Dispense: 14 capsule; Refill: 0 - Ambulatory referral to Podiatry  7. Need for pneumococcal vaccine - Pneumococcal conjugate vaccine 20-valent   Meds ordered this encounter  Medications   cephALEXin (KEFLEX) 500 MG capsule    Sig: Take 1 capsule (500 mg total) by mouth 2 (two) times daily.    Dispense:  14 capsule    Refill:  0   pantoprazole (PROTONIX) 40 MG tablet    Sig: Take 1 tablet (40 mg total) by mouth daily.    Dispense:  90 tablet    Refill:  1   amLODipine (NORVASC) 10 MG tablet    Sig: Take 1 tablet (10 mg total) by mouth daily.    Dispense:  90 tablet    Refill:  1    Follow-up: Return in about 6 months (around 02/10/2022) for Chronic medical conditions.       Charlott Rakes, MD, FAAFP.  [facility] [place], [place]   08/12/2021,  12:52 PM

## 2021-08-12 NOTE — Progress Notes (Signed)
Need medication for heartburn.  Toenail is green.Left big toe.

## 2021-08-13 LAB — HCV INTERPRETATION

## 2021-08-13 LAB — HCV AB W REFLEX TO QUANT PCR: HCV Ab: <0.1 s/co ratio (ref 0.0–0.9)

## 2021-08-19 ENCOUNTER — Ambulatory Visit (INDEPENDENT_AMBULATORY_CARE_PROVIDER_SITE_OTHER): Payer: 59

## 2021-08-19 ENCOUNTER — Ambulatory Visit (INDEPENDENT_AMBULATORY_CARE_PROVIDER_SITE_OTHER): Payer: 59 | Admitting: Podiatry

## 2021-08-19 ENCOUNTER — Other Ambulatory Visit: Payer: Self-pay

## 2021-08-19 DIAGNOSIS — M79672 Pain in left foot: Secondary | ICD-10-CM

## 2021-08-19 DIAGNOSIS — M21611 Bunion of right foot: Secondary | ICD-10-CM

## 2021-08-19 DIAGNOSIS — M79671 Pain in right foot: Secondary | ICD-10-CM | POA: Diagnosis not present

## 2021-08-19 DIAGNOSIS — M21612 Bunion of left foot: Secondary | ICD-10-CM

## 2021-08-19 DIAGNOSIS — M21619 Bunion of unspecified foot: Secondary | ICD-10-CM

## 2021-08-19 DIAGNOSIS — B351 Tinea unguium: Secondary | ICD-10-CM

## 2021-08-19 DIAGNOSIS — L600 Ingrowing nail: Secondary | ICD-10-CM

## 2021-08-19 MED ORDER — TERBINAFINE HCL 250 MG PO TABS: 250.0000 mg | ORAL_TABLET | Freq: Every day | ORAL | 0 refills | Status: DC

## 2021-08-19 NOTE — Progress Notes (Signed)
Subjective:   Patient ID: Amanda Garner, female   DOB: 50 y.o.   MRN: [id number]   HPI Patient presents with chronic ingrown toenail of the left big toe that is been sore for the past month and also has bunion deformity that she knows she needs to get corrected would like to do in January left over right foot.  Patient does smoke half pack per day tries to stay active   Review of Systems  All other systems reviewed and are negative.      Objective:  Physical Exam Vitals and nursing note reviewed.  Constitutional:      Appearance: She is well-developed.  Pulmonary:     Effort: Pulmonary effort is normal.  Musculoskeletal:        General: Normal range of motion.  Skin:    General: Skin is warm.  Neurological:     Mental Status: She is alert.    Neurovascular status intact muscle strength adequate range of motion within normal limits.  Patient is found to have large hyperostosis medial aspect first metatarsal head left painful when pressed and is noted to have incurvated left hallux medial border painful when pressed making shoe gear difficult.  Patient has good digital perfusion well oriented x3     Assessment:  Chronic bunion deformity left not responding to wider shoe gear soaks and padding along with chronic ingrown toenail deformity left hallux medial border     Plan:  H&P reviewed condition at great length.  For the nail I recommended correction and she wants surgery and I allowed her to read consent form explaining procedure risk and she signed consent form.  I infiltrated the left hallux 60 mg like Marcaine mixture sterile prep done and using sterile instrumentation remove the medial border exposed matrix applied phenol 3 applications 30 seconds followed by alcohol lavage and sterile dressing.  Instructed to leave dressing on 24 hours take it off earlier if any throbbing were to occur and I then went ahead reviewed her bunion do think distal osteotomy can be done reviewed  procedure and she is can have it done in January.  Again we reviewed the different treatment she is done up to this point without relief and she also has a fungal infection left foot and I did place her on 60 days of oral Lamisil and she just had liver function studies which were normal  X-rays indicate there is elevation of the intermetatarsal angle left over right approximate 15 degrees left 30 degrees right no other pathology noted

## 2021-08-19 NOTE — Patient Instructions (Signed)

## 2021-08-20 ENCOUNTER — Other Ambulatory Visit: Payer: Self-pay | Admitting: Podiatry

## 2021-08-20 DIAGNOSIS — M21619 Bunion of unspecified foot: Secondary | ICD-10-CM

## 2021-08-28 ENCOUNTER — Ambulatory Visit (AMBULATORY_SURGERY_CENTER): Payer: 59 | Admitting: Internal Medicine

## 2021-08-28 ENCOUNTER — Encounter: Payer: Self-pay | Admitting: Internal Medicine

## 2021-08-28 VITALS — BP 124/85 | HR 83 | Temp 96.9°F | Resp 12 | Ht 66.0 in | Wt 209.0 lb

## 2021-08-28 DIAGNOSIS — D125 Benign neoplasm of sigmoid colon: Secondary | ICD-10-CM | POA: Diagnosis not present

## 2021-08-28 DIAGNOSIS — K5730 Diverticulosis of large intestine without perforation or abscess without bleeding: Secondary | ICD-10-CM

## 2021-08-28 DIAGNOSIS — K529 Noninfective gastroenteritis and colitis, unspecified: Secondary | ICD-10-CM | POA: Diagnosis not present

## 2021-08-28 DIAGNOSIS — R9389 Abnormal findings on diagnostic imaging of other specified body structures: Secondary | ICD-10-CM | POA: Diagnosis not present

## 2021-08-28 DIAGNOSIS — N824 Other female intestinal-genital tract fistulae: Secondary | ICD-10-CM | POA: Diagnosis not present

## 2021-08-28 MED ORDER — SODIUM CHLORIDE 0.9 % IV SOLN: 500.0000 mL | INTRAVENOUS | Status: DC

## 2021-08-28 NOTE — Patient Instructions (Addendum)
Handout on polyp provided   Resume previous medications and diet.  Await pathology report.  Office follow up in 3 months.  Repeat colonoscopy is recommend . The date will be determined after pathology results from today become available.  Follow-up planned with Dr Dema Severin to discuss and schedule sigmoid resection and repair of colovaginal fistula.   YOU HAD AN ENDOSCOPIC PROCEDURE TODAY AT [facility]:   Refer to the procedure report that was given to you for any specific questions about what was found during the examination.  If the procedure report does not answer your questions, please call your gastroenterologist to clarify.  If you requested that your care partner not be given the details of your procedure findings, then the procedure report has been included in a sealed envelope for you to review at your convenience later.  YOU SHOULD EXPECT: Some feelings of bloating in the abdomen. Passage of more gas than usual.  Walking can help get rid of the air that was put into your GI tract during the procedure and reduce the bloating. If you had a lower endoscopy (such as a colonoscopy or flexible sigmoidoscopy) you may notice spotting of blood in your stool or on the toilet paper. If you underwent a bowel prep for your procedure, you may not have a normal bowel movement for a few days.  Please Note:  You might notice some irritation and congestion in your nose or some drainage.  This is from the oxygen used during your procedure.  There is no need for concern and it should clear up in a day or so.  SYMPTOMS TO REPORT IMMEDIATELY:  Following lower endoscopy (colonoscopy or flexible sigmoidoscopy):  Excessive amounts of blood in the stool  Significant tenderness or worsening of abdominal pains  Swelling of the abdomen that is new, acute  Fever of 100F or higher  For urgent or emergent issues, a gastroenterologist can be reached at any hour by calling [phone]. Do not  use MyChart messaging for urgent concerns.    DIET:  We do recommend a small meal at first, but then you may proceed to your regular diet.  Drink plenty of fluids but you should avoid alcoholic beverages for 24 hours.  ACTIVITY:  You should plan to take it easy for the rest of today and you should NOT DRIVE or use heavy machinery until tomorrow (because of the sedation medicines used during the test).    FOLLOW UP: Our staff will call the number listed on your records 48-72 hours following your procedure to check on you and address any questions or concerns that you may have regarding the information given to you following your procedure. If we do not reach you, we will leave a message.  We will attempt to reach you two times.  During this call, we will ask if you have developed any symptoms of COVID 19. If you develop any symptoms (ie: fever, flu-like symptoms, shortness of breath, cough etc.) before then, please call [phone].  If you test positive for Covid 19 in the 2 weeks post procedure, please call and report this information to [place].    If any biopsies were taken you will be contacted by phone or by letter within the next 1-3 weeks.  Please call us at [phone] if you have not heard about the biopsies in 3 weeks.    SIGNATURES/CONFIDENTIALITY: You and/or your care partner have signed paperwork which will be entered into your electronic medical record.  These signatures attest to the fact that that the information above on your After Visit Summary has been reviewed and is understood.  Full responsibility of the confidentiality of this discharge information lies with you and/or your care-partner.

## 2021-08-28 NOTE — Progress Notes (Signed)
Patient presents for colonoscopy See GI office note from 08/05/2021 for details. She remains appropriate for outpatient colonoscopy in the [place] No significant change in medical history since that office visit No abdominal pain of late, no recent fevers or chills, no chest pain or shortness of breath, no fevers She is still had stool and bowel contents per rectum and per vagina

## 2021-08-28 NOTE — Op Note (Signed)
[place] Patient Name: Amanda Garner Procedure Date: 08/28/2021 3:41 PM MRN: [id number] Endoscopist: Jerene Bears , MD Age: 51 Referring MD:  Date of Birth: 01-08-1971 Gender: Female Account #: [id number] Procedure:                Colonoscopy Indications:              Abnormal CT of the GI tract (diverticulitis,                            ileitis), Follow-up of diverticulitis, Colovaginal                            fistula, no prior colonoscopy Medicines:                Monitored Anesthesia Care Procedure:                Pre-Anesthesia Assessment:                           - Prior to the procedure, a History and Physical                            was performed, and patient medications and                            allergies were reviewed. The patient's tolerance of                            previous anesthesia was also reviewed. The risks                            and benefits of the procedure and the sedation                            options and risks were discussed with the patient.                            All questions were answered, and informed consent                            was obtained. Prior Anticoagulants: The patient has                            taken no previous anticoagulant or antiplatelet                            agents. ASA Grade Assessment: II - A patient with                            mild systemic disease. After reviewing the risks                            and benefits, the patient was deemed in  satisfactory condition to undergo the procedure.                           After obtaining informed consent, the colonoscope                            was passed under direct vision. Throughout the                            procedure, the patient's blood pressure, pulse, and                            oxygen saturations were monitored continuously. The                            PCF-HQ190L Colonoscope was  introduced through the                            anus and advanced to the terminal ileum. The                            colonoscopy was technically difficult and complex                            due to multiple diverticula in the colon and                            restricted mobility of the colon. Successful                            completion of the procedure was aided by changing                            endoscopes (from pediatric colonoscope to the                            ultraslim colonoscope). The patient tolerated the                            procedure well. The quality of the bowel                            preparation was good. The terminal ileum, ileocecal                            valve, appendiceal orifice, and rectum were                            photographed. Scope In: 3:48:21 PM Scope Out: 9:24:46 PM Scope Withdrawal Time: 0 hours 11 minutes 55 seconds  Total Procedure Duration: 0 hours 27 minutes 50 seconds  Findings:                 The digital rectal exam was normal.  Scattered inflammation, mild in severity and                            characterized by erosions and erythema was found in                            the terminal ileum. Biopsies were taken with a cold                            forceps for histology.                           Multiple small-mouthed diverticula were found in                            the distal and mid sigmoid colon. There was                            narrowing of the colon in association with the                            diverticular opening in the distal sigmoid. Within                            this relatively short segment (4-5 cm) there is                            evidence of scarring and pale mucosa. No convincing                            evidence of colonic Crohn's disease. Biopsies were                            taken with a cold forceps for histology.                            A 4 mm polyp was found in the proximal sigmoid                            colon. The polyp was sessile. The polyp was removed                            with a cold snare. Resection and retrieval were                            complete.                           The exam was otherwise normal throughout the                            examined colon. Normal mucosa throughout the colon  with exception of the short sigmoid segment                            described above. Complications:            No immediate complications. Estimated Blood Loss:     Estimated blood loss was minimal. Impression:               - Ileitis (very mild), rule out Crohn's disease.                            Biopsied.                           - One 4 mm polyp in the proximal sigmoid colon,                            removed with a cold snare. Resected and retrieved.                           - Severe diverticulosis in the distal and mid                            sigmoid colon. There was narrowing of the colon in                            association with the diverticular opening over a                            short segment in the sigmoid (as described above).                            No convincing evidence for colonic Crohn's disease.                            Biopsied.                           - No definitive colovaginal fistula seen, but views                            limited in the stenosed short sigmoid segment                            involved with diverticula. Recommendation:           - Patient has a contact number available for                            emergencies. The signs and symptoms of potential                            delayed complications were discussed with the                            patient. Return to  normal activities tomorrow.                            Written discharge instructions were provided to the                            patient.                            - Resume previous diet.                           - Continue present medications.                           - Await pathology results.                           - Follow-up planned with Dr. Dema Severin to discuss and                            schedule sigmoid resection and repair of                            colovaginal fistula.                           - Office follow-up with me recommended in 3 months.                           - Repeat colonoscopy is recommended. The                            colonoscopy date will be determined after pathology                            results from today's exam become available for                            review. Jerene Bears, MD 08/28/2021 4:27:42 PM This report has been signed electronically.

## 2021-08-28 NOTE — Progress Notes (Signed)
Called to room to assist during endoscopic procedure.  Patient ID and intended procedure confirmed with present staff. Received instructions for my participation in the procedure from the performing physician.  

## 2021-08-28 NOTE — Progress Notes (Signed)
Report to PACU, RN, vss, BBS= Clear.  

## 2021-09-01 ENCOUNTER — Telehealth: Payer: Self-pay

## 2021-09-01 NOTE — Telephone Encounter (Signed)
°  Follow up Call-  Call back number 08/28/2021  Post procedure Call Back phone  # [phone]  Permission to leave phone message Yes  Some recent data might be hidden     Patient questions:  Do you have a fever, pain , or abdominal swelling? No. Pain Score  0 *  Have you tolerated food without any problems? Yes.    Have you been able to return to your normal activities? Yes.    Do you have any questions about your discharge instructions: Diet   Yes.   Medications  Yes.   Follow up visit  Yes.    Do you have questions or concerns about your Care? Yes.    Actions: * If pain score is 4 or above: No action needed, pain <4.  Have you developed a fever since your procedure? no  2.   Have you had an respiratory symptoms (SOB or cough) since your procedure? no  3.   Have you tested positive for COVID 19 since your procedure no  4.   Have you had any family members/close contacts diagnosed with the COVID 19 since your procedure?  no   If yes to any of these questions please route to Joylene John, RN and Joella Prince, RN

## 2021-09-02 ENCOUNTER — Ambulatory Visit (INDEPENDENT_AMBULATORY_CARE_PROVIDER_SITE_OTHER): Payer: 59 | Admitting: Podiatry

## 2021-09-02 ENCOUNTER — Other Ambulatory Visit: Payer: Self-pay

## 2021-09-02 ENCOUNTER — Encounter: Payer: Self-pay | Admitting: Podiatry

## 2021-09-02 DIAGNOSIS — M21619 Bunion of unspecified foot: Secondary | ICD-10-CM | POA: Diagnosis not present

## 2021-09-02 DIAGNOSIS — L600 Ingrowing nail: Secondary | ICD-10-CM

## 2021-09-02 NOTE — Progress Notes (Signed)
Subjective:   Patient ID: Amanda Garner, female   DOB: 51 y.o.   MRN: [id number]   HPI Patient states she wants to discuss the surgery again and needs to hold off if she is having colon surgery and hopefully can get this done in February.  Also wanted to have her nail checked   ROS      Objective:  Physical Exam  Neurovascular status intact with patient showing structural bunion deformity left painful when pressed with redness and well-healed ingrown toenail site     Assessment:  HAV deformity bilateral along with nail that is doing well from previous surgery     Plan:  H&P reviewed condition discussed bunion correction and we can do this approximately 3 to 4 weeks after her colon surgery when cleared by her doctor.  Do not recommend further treatments for the nail and reviewed again the surgical case that will be done

## 2021-09-04 ENCOUNTER — Encounter: Payer: Self-pay | Admitting: Internal Medicine

## 2021-09-21 ENCOUNTER — Ambulatory Visit: Payer: 59

## 2021-09-28 ENCOUNTER — Other Ambulatory Visit: Payer: Self-pay | Admitting: Urology

## 2021-09-28 ENCOUNTER — Other Ambulatory Visit: Payer: Self-pay

## 2021-09-28 ENCOUNTER — Ambulatory Visit
Admission: RE | Admit: 2021-09-28 | Discharge: 2021-09-28 | Disposition: A | Discharge status: Home / Self Care | Payer: 59 | Source: Ambulatory Visit | Attending: Family Medicine | Admitting: Family Medicine

## 2021-09-28 DIAGNOSIS — Z1231 Encounter for screening mammogram for malignant neoplasm of breast: Secondary | ICD-10-CM

## 2021-10-14 NOTE — Progress Notes (Signed)
Sent message, via epic in basket, requesting orders in epic from surgeon.  

## 2021-10-16 ENCOUNTER — Ambulatory Visit: Payer: Self-pay | Admitting: Surgery

## 2021-10-16 DIAGNOSIS — Z01818 Encounter for other preprocedural examination: Secondary | ICD-10-CM

## 2021-10-27 ENCOUNTER — Other Ambulatory Visit (HOSPITAL_COMMUNITY): Payer: Self-pay

## 2021-10-27 NOTE — Patient Instructions (Addendum)
DUE TO COVID-19 ONLY ONE VISITOR IS ALLOWED TO COME WITH YOU AND STAY IN THE WAITING ROOM ONLY DURING PRE OP AND PROCEDURE.   **NO VISITORS ARE ALLOWED IN THE SHORT STAY AREA OR RECOVERY ROOM!!**  IF YOU WILL BE ADMITTED INTO THE HOSPITAL YOU ARE ALLOWED ONLY TWO SUPPORT PEOPLE DURING VISITATION HOURS ONLY (7 AM -8PM)   The support person(s) must pass our screening, gel in and out, and wear a mask at all times, including in the patients room. Patients must also wear a mask when staff or their support person are in the room. Visitors GUEST BADGE MUST BE WORN VISIBLY  One adult visitor may remain with you overnight and MUST be in the room by 8 P.M. No visitors under the age of 50. Any visitor under the age of 66 must be accompanied by an adult.   ------------------------------------------------------------------------------------------------------------------------------------------------------------------------------------------------------------    COVID SWAB TESTING MUST BE COMPLETED ON:  11/02/21 at 12:15 PM    (*ARRIVE AT YOUR APPOINTMENT TIME STAFF IS NOT HERE BEFORE 8AM!!!*)    Site: [facility] [address] [place] Enter: Main Entrance have a seat in the waiting area to the right of main entrance (DO NOT [place]!!!!!) Dial: [phone] to alert staff you have arrived  You are not required to quarantine, however you are required to wear a well-fitted mask when you are out and around people not in your household.  Hand Hygiene often Do NOT share personal items Notify your provider if you are in close contact with someone who has COVID or you develop fever 100.4 or greater, new onset of sneezing, cough, sore throat, shortness of breath or body aches.  -------------------------------------------------------------------------------------------------------------------------------------------------------------------        Your procedure is scheduled  on: 11/04/21   Report to [facility] Main Entrance    Report to admitting at 6:15 AM   Call this number if you have problems the morning of surgery [phone]  Drink plenty of fluids on the day of prep to prevent dehydration.  Follow all bowel prep instructions from the Dr's office.   Do not eat food :After Midnight.   After Midnight you may have the following liquids until _5:30_____ AM DAY OF SURGERY  Water Black Coffee (sugar ok, NO MILK/CREAM OR CREAMERS)  Tea (sugar ok, NO MILK/CREAM OR CREAMERS) regular and decaf                             Plain Jell-O (NO RED)                                           Fruit ices (not with fruit pulp, NO RED)                                     Popsicles (NO RED)                                                                  Juice: apple, WHITE grape, WHITE cranberry  Sports drinks like Gatorade (NO RED) Clear broth(vegetable,chicken,beef)               Drink 2 Ensure drinks AT 10:00 PM the night before surgery.        The day of surgery:  Drink ONE (1) Pre-Surgery Clear Ensure  at  5:15 AM the morning of surgery. Drink in one sitting. Do not sip.  This drink was given to you during your hospital  pre-op appointment visit. Nothing else to drink after completing the  Pre-Surgery Clear Ensure at 5:30 AM          If you have questions, please contact your surgeons office.   FOLLOW BOWEL PREP AND ANY ADDITIONAL PRE OP INSTRUCTIONS YOU RECEIVED FROM YOUR SURGEON'S OFFICE!!!     Oral Hygiene is also important to reduce your risk of infection.                                    Remember - BRUSH YOUR TEETH THE MORNING OF SURGERY WITH YOUR REGULAR TOOTHPASTE   Do NOT smoke after Midnight   Take these medicines the morning of surgery with A SIP OF WATER: Amlodipine, Terbinafine, Pantoprazole   Bring CPAP mask and tubing day of surgery.                              You may not have any metal on your body including hair  pins, jewelry, and body piercing             Do not wear make-up, lotions, powders, perfumes/cologne, or deodorant  Do not wear nail polish including gel and S&S, artificial/acrylic nails, or any other type of covering on natural nails including finger and toenails. If you have artificial nails, gel coating, etc. that needs to be removed by a nail salon please have this removed prior to surgery or surgery may need to be canceled/ delayed if the surgeon/ anesthesia feels like they are unable to be safely monitored.   Do not shave  48 hours prior to surgery.     Do not bring valuables to the hospital. [place].   Contacts, dentures or bridgework may not be worn into surgery.   Bring small overnight bag day of surgery.    Patients discharged on the day of surgery will not be allowed to drive home.  Someone NEEDS to stay with you for the first 24 hours after anesthesia.   Special Instructions: Bring a copy of your healthcare power of attorney and living will documents  the day of surgery if you haven't scanned them before.              Please read over the following fact sheets you were given: IF YOU HAVE QUESTIONS ABOUT YOUR PRE-OP INSTRUCTIONS PLEASE CALL [phone]     [facility] - Preparing for Surgery Before surgery, you can play an important role.  Because skin is not sterile, your skin needs to be as free of germs as possible.  You can reduce the number of germs on your skin by washing with CHG (chlorahexidine gluconate) soap before surgery.  CHG is an antiseptic cleaner which kills germs and bonds with the skin to continue killing germs even after washing. Please DO NOT  use if you have an allergy to CHG or antibacterial soaps.  If your skin becomes reddened/irritated stop using the CHG and inform your nurse when you arrive at Short Stay. Do not shave (including legs and underarms) for at least 48 hours prior to the first CHG shower.    Please follow these instructions carefully:  1.  Shower with CHG Soap the night before surgery and the  morning of Surgery.  2.  If you choose to wash your hair, wash your hair first as usual with your  normal  shampoo.  3.  After you shampoo, rinse your hair and body thoroughly to remove the  shampoo.                            4.  Use CHG as you would any other liquid soap.  You can apply chg directly  to the skin and wash                       Gently with a scrungie or clean washcloth.  5.  Apply the CHG Soap to your body ONLY FROM THE NECK DOWN.   Do not use on face/ open                           Wound or open sores. Avoid contact with eyes, ears mouth and genitals (private parts).                       Wash face,  Genitals (private parts) with your normal soap.             6.  Wash thoroughly, paying special attention to the area where your surgery  will be performed.  7.  Thoroughly rinse your body with warm water from the neck down.  8.  DO NOT shower/wash with your normal soap after using and rinsing off  the CHG Soap.                9.  Pat yourself dry with a clean towel.            10.  Wear clean pajamas.            11.  Place clean sheets on your bed the night of your first shower and do not  sleep with pets. Day of Surgery : Do not apply any lotions/deodorants the morning of surgery.  Please wear clean clothes to the hospital/surgery center.  FAILURE TO FOLLOW THESE INSTRUCTIONS MAY RESULT IN THE CANCELLATION OF YOUR SURGERY  ________________________________________________________________________   Incentive Spirometer  An incentive spirometer is a tool that can help keep your lungs clear and active. This tool measures how well you are filling your lungs with each breath. Taking long deep breaths may help reverse or decrease the chance of developing breathing (pulmonary) problems (especially infection) following: A long period of time when you are unable to move or be  active. BEFORE THE PROCEDURE  If the spirometer includes an indicator to show your best effort, your nurse or respiratory therapist will set it to a desired goal. If possible, sit up straight or lean slightly forward. Try not to slouch. Hold the incentive spirometer in an upright position. INSTRUCTIONS FOR USE  Sit on the edge of your bed if possible, or sit up as far as you can in bed or on a  chair. Hold the incentive spirometer in an upright position. Breathe out normally. Place the mouthpiece in your mouth and seal your lips tightly around it. Breathe in slowly and as deeply as possible, raising the piston or the ball toward the top of the column. Hold your breath for 3-5 seconds or for as long as possible. Allow the piston or ball to fall to the bottom of the column. Remove the mouthpiece from your mouth and breathe out normally. Rest for a few seconds and repeat Steps 1 through 7 at least 10 times every 1-2 hours when you are awake. Take your time and take a few normal breaths between deep breaths. The spirometer may include an indicator to show your best effort. Use the indicator as a goal to work toward during each repetition. After each set of 10 deep breaths, practice coughing to be sure your lungs are clear. If you have an incision (the cut made at the time of surgery), support your incision when coughing by placing a pillow or rolled up towels firmly against it. Once you are able to get out of bed, walk around indoors and cough well. You may stop using the incentive spirometer when instructed by your caregiver.  RISKS AND COMPLICATIONS Take your time so you do not get dizzy or light-headed. If you are in pain, you may need to take or ask for pain medication before doing incentive spirometry. It is harder to take a deep breath if you are having pain. AFTER USE Rest and breathe slowly and easily. It can be helpful to keep track of a log of your progress. Your caregiver can provide you  with a simple table to help with this. If you are using the spirometer at home, follow these instructions: [place] IF:  You are having difficultly using the spirometer. You have trouble using the spirometer as often as instructed. Your pain medication is not giving enough relief while using the spirometer. You develop fever of 100.5 F (38.1 C) or higher. SEEK IMMEDIATE MEDICAL CARE IF:  You cough up bloody sputum that had not been present before. You develop fever of 102 F (38.9 C) or greater. You develop worsening pain at or near the incision site. MAKE SURE YOU:  Understand these instructions. Will watch your condition. Will get help right away if you are not doing well or get worse. Document Released: 12/20/2006 Document Revised: 11/01/2011 Document Reviewed: 02/20/2007 [facility] Patient Information 2014 [place], [place].   ________________________________________________________________________

## 2021-10-28 ENCOUNTER — Encounter (HOSPITAL_COMMUNITY)
Admission: RE | Admit: 2021-10-28 | Discharge: 2021-10-28 | Disposition: A | Discharge status: Home / Self Care | Payer: 59 | Source: Ambulatory Visit | Attending: Surgery | Admitting: Surgery

## 2021-10-28 ENCOUNTER — Other Ambulatory Visit: Payer: Self-pay

## 2021-10-28 ENCOUNTER — Encounter (HOSPITAL_COMMUNITY): Payer: Self-pay

## 2021-10-28 VITALS — BP 167/85 | HR 91 | Temp 98.9°F | Resp 16 | Ht 66.0 in | Wt 215.0 lb

## 2021-10-28 DIAGNOSIS — Z01812 Encounter for preprocedural laboratory examination: Secondary | ICD-10-CM | POA: Insufficient documentation

## 2021-10-28 DIAGNOSIS — Z01818 Encounter for other preprocedural examination: Secondary | ICD-10-CM

## 2021-10-28 LAB — CBC WITH DIFFERENTIAL/PLATELET
Abs Immature Granulocytes: 0.04 10*3/uL (ref 0.00–0.07)
Basophils Absolute: 0.1 10*3/uL (ref 0.0–0.1)
Basophils Relative: 1 %
Eosinophils Absolute: 0.2 10*3/uL (ref 0.0–0.5)
Eosinophils Relative: 3 %
HCT: 39.6 % (ref 36.0–46.0)
Hemoglobin: 12.6 g/dL (ref 12.0–15.0)
Immature Granulocytes: 1 %
Lymphocytes Relative: 41 %
Lymphs Abs: 2.7 10*3/uL (ref 0.7–4.0)
MCH: 21.7 pg — ABNORMAL LOW (ref 26.0–34.0)
MCHC: 31.8 g/dL (ref 30.0–36.0)
MCV: 68.3 fL — ABNORMAL LOW (ref 80.0–100.0)
Monocytes Absolute: 0.5 10*3/uL (ref 0.1–1.0)
Monocytes Relative: 7 %
Neutro Abs: 3.1 10*3/uL (ref 1.7–7.7)
Neutrophils Relative %: 47 %
Platelets: 306 10*3/uL (ref 150–400)
RBC: 5.8 MIL/uL — ABNORMAL HIGH (ref 3.87–5.11)
RDW: 14.5 % (ref 11.5–15.5)
WBC: 6.6 10*3/uL (ref 4.0–10.5)
nRBC: 0 % (ref 0.0–0.2)

## 2021-10-28 LAB — COMPREHENSIVE METABOLIC PANEL
ALT: 14 U/L (ref 0–44)
AST: 25 U/L (ref 15–41)
Albumin: 3.9 g/dL (ref 3.5–5.0)
Alkaline Phosphatase: 74 U/L (ref 38–126)
Anion gap: 9 (ref 5–15)
BUN: 10 mg/dL (ref 6–20)
CO2: 23 mmol/L (ref 22–32)
Calcium: 9.2 mg/dL (ref 8.9–10.3)
Chloride: 107 mmol/L (ref 98–111)
Creatinine, Ser: 0.69 mg/dL (ref 0.44–1.00)
GFR, Estimated: >60 mL/min (ref >60)
Glucose, Bld: 162 mg/dL — ABNORMAL HIGH (ref 70–99)
Potassium: 4.3 mmol/L (ref 3.5–5.1)
Sodium: 139 mmol/L (ref 135–145)
Total Bilirubin: 1.3 mg/dL — ABNORMAL HIGH (ref 0.3–1.2)
Total Protein: 7.8 g/dL (ref 6.5–8.1)

## 2021-10-28 LAB — GLUCOSE, CAPILLARY: Glucose-Capillary: 158 mg/dL — ABNORMAL HIGH (ref 70–99)

## 2021-10-28 LAB — SURGICAL PCR SCREEN
MRSA, PCR: NEGATIVE
Staphylococcus aureus: NEGATIVE

## 2021-10-28 LAB — HEMOGLOBIN A1C
Hgb A1c MFr Bld: 6.6 % — ABNORMAL HIGH (ref 4.8–5.6)
Mean Plasma Glucose: 142.72 mg/dL

## 2021-10-28 NOTE — Progress Notes (Signed)
COVID test-11/02/21 at 12:15 ? ? ?Bowel prep reminder:Yes. Pt said he husband threw away the bowel prep instructions. I gave her a copy and reviewed it with her. She will call Dr's office because she couldn't get the meds from her pharmacy. ? ?PCP - Geryl Rankins NP ?Cardiologist - none ? ?Chest x-ray - no ?EKG - 02/16/21-epic ?Stress Test - no ?ECHO - no ?Cardiac Cath - no ?Pacemaker/ICD device last checked:NA ? ?Sleep Study - no ?CPAP -  ? ?Fasting Blood Sugar - NA ?Checks Blood Sugar _____ times a day ? ?Blood Thinner Instructions:NA ?Aspirin Instructions: ?Last Dose: ? ?Anesthesia review: no ? ?Patient denies shortness of breath, fever, cough and chest pain at PAT appointment ?Pt has no SOB with any activities.  ? ?Patient verbalized understanding of instructions that were given to them at the PAT appointment. Patient was also instructed that they will need to review over the PAT instructions again at home before surgery. yes ?

## 2021-11-02 ENCOUNTER — Encounter (HOSPITAL_COMMUNITY)
Admission: RE | Admit: 2021-11-02 | Discharge: 2021-11-02 | Disposition: A | Discharge status: Home / Self Care | Payer: 59 | Source: Ambulatory Visit | Attending: Surgery | Admitting: Surgery

## 2021-11-02 ENCOUNTER — Other Ambulatory Visit: Payer: Self-pay

## 2021-11-02 LAB — SARS CORONAVIRUS 2 (TAT 6-24 HRS): SARS Coronavirus 2: NEGATIVE

## 2021-11-04 ENCOUNTER — Other Ambulatory Visit: Payer: Self-pay

## 2021-11-04 ENCOUNTER — Encounter (HOSPITAL_COMMUNITY): Payer: Self-pay | Admitting: Surgery

## 2021-11-04 ENCOUNTER — Encounter (HOSPITAL_COMMUNITY)
Admission: RE | Disposition: A | Discharge status: Home / Self Care | Payer: Self-pay | Source: Ambulatory Visit | Attending: Surgery

## 2021-11-04 ENCOUNTER — Inpatient Hospital Stay (HOSPITAL_COMMUNITY): Payer: 59 | Admitting: Anesthesiology

## 2021-11-04 ENCOUNTER — Inpatient Hospital Stay (HOSPITAL_COMMUNITY)
Admission: RE | Admit: 2021-11-04 | Discharge: 2021-11-06 | DRG: 330 | Disposition: A | Discharge status: Home / Self Care | Payer: 59 | Source: Ambulatory Visit | Attending: Surgery | Admitting: Surgery

## 2021-11-04 DIAGNOSIS — E119 Type 2 diabetes mellitus without complications: Secondary | ICD-10-CM

## 2021-11-04 DIAGNOSIS — Z8249 Family history of ischemic heart disease and other diseases of the circulatory system: Secondary | ICD-10-CM

## 2021-11-04 DIAGNOSIS — Z823 Family history of stroke: Secondary | ICD-10-CM

## 2021-11-04 DIAGNOSIS — K660 Peritoneal adhesions (postprocedural) (postinfection): Secondary | ICD-10-CM | POA: Diagnosis present

## 2021-11-04 DIAGNOSIS — Z20822 Contact with and (suspected) exposure to covid-19: Secondary | ICD-10-CM | POA: Diagnosis present

## 2021-11-04 DIAGNOSIS — Z800 Family history of malignant neoplasm of digestive organs: Secondary | ICD-10-CM | POA: Diagnosis not present

## 2021-11-04 DIAGNOSIS — N823 Fistula of vagina to large intestine: Secondary | ICD-10-CM

## 2021-11-04 DIAGNOSIS — Z9049 Acquired absence of other specified parts of digestive tract: Secondary | ICD-10-CM | POA: Diagnosis not present

## 2021-11-04 DIAGNOSIS — K219 Gastro-esophageal reflux disease without esophagitis: Secondary | ICD-10-CM | POA: Diagnosis present

## 2021-11-04 DIAGNOSIS — Z98891 History of uterine scar from previous surgery: Secondary | ICD-10-CM

## 2021-11-04 DIAGNOSIS — Z79899 Other long term (current) drug therapy: Secondary | ICD-10-CM

## 2021-11-04 DIAGNOSIS — E1165 Type 2 diabetes mellitus with hyperglycemia: Secondary | ICD-10-CM | POA: Diagnosis present

## 2021-11-04 DIAGNOSIS — Z90711 Acquired absence of uterus with remaining cervical stump: Secondary | ICD-10-CM | POA: Diagnosis not present

## 2021-11-04 DIAGNOSIS — F17200 Nicotine dependence, unspecified, uncomplicated: Secondary | ICD-10-CM | POA: Diagnosis not present

## 2021-11-04 DIAGNOSIS — Z833 Family history of diabetes mellitus: Secondary | ICD-10-CM

## 2021-11-04 DIAGNOSIS — D573 Sickle-cell trait: Secondary | ICD-10-CM | POA: Diagnosis present

## 2021-11-04 DIAGNOSIS — B353 Tinea pedis: Secondary | ICD-10-CM | POA: Diagnosis present

## 2021-11-04 DIAGNOSIS — I10 Essential (primary) hypertension: Secondary | ICD-10-CM | POA: Diagnosis present

## 2021-11-04 DIAGNOSIS — F17210 Nicotine dependence, cigarettes, uncomplicated: Secondary | ICD-10-CM | POA: Diagnosis present

## 2021-11-04 DIAGNOSIS — Z86010 Personal history of colonic polyps: Secondary | ICD-10-CM

## 2021-11-04 DIAGNOSIS — K5732 Diverticulitis of large intestine without perforation or abscess without bleeding: Secondary | ICD-10-CM

## 2021-11-04 DIAGNOSIS — Z01818 Encounter for other preprocedural examination: Secondary | ICD-10-CM

## 2021-11-04 DIAGNOSIS — Z801 Family history of malignant neoplasm of trachea, bronchus and lung: Secondary | ICD-10-CM

## 2021-11-04 HISTORY — PX: XI ROBOTIC ASSISTED LOWER ANTERIOR RESECTION: SHX6558

## 2021-11-04 HISTORY — PX: FLEXIBLE SIGMOIDOSCOPY: SHX5431

## 2021-11-04 LAB — CBC
HCT: 37.9 % (ref 36.0–46.0)
Hemoglobin: 12.1 g/dL (ref 12.0–15.0)
MCH: 22 pg — ABNORMAL LOW (ref 26.0–34.0)
MCHC: 31.9 g/dL (ref 30.0–36.0)
MCV: 68.8 fL — ABNORMAL LOW (ref 80.0–100.0)
Platelets: 261 10*3/uL (ref 150–400)
RBC: 5.51 MIL/uL — ABNORMAL HIGH (ref 3.87–5.11)
RDW: 14.6 % (ref 11.5–15.5)
WBC: 14 10*3/uL — ABNORMAL HIGH (ref 4.0–10.5)
nRBC: 0 % (ref 0.0–0.2)

## 2021-11-04 LAB — ABO/RH: ABO/RH(D): O POS

## 2021-11-04 LAB — GLUCOSE, CAPILLARY
Glucose-Capillary: 242 mg/dL — ABNORMAL HIGH (ref 70–99)
Glucose-Capillary: 238 mg/dL — ABNORMAL HIGH (ref 70–99)
Glucose-Capillary: 222 mg/dL — ABNORMAL HIGH (ref 70–99)

## 2021-11-04 LAB — CREATININE, SERUM
Creatinine, Ser: 1.02 mg/dL — ABNORMAL HIGH (ref 0.44–1.00)
GFR, Estimated: >60 mL/min (ref >60)

## 2021-11-04 LAB — TYPE AND SCREEN
ABO/RH(D): O POS
Antibody Screen: NEGATIVE

## 2021-11-04 SURGERY — RESECTION, RECTUM, LOW ANTERIOR, ROBOT-ASSISTED
Anesthesia: General

## 2021-11-04 MED ORDER — MIDAZOLAM HCL 2 MG/2ML IJ SOLN
INTRAMUSCULAR | Status: AC
  Filled 2021-11-04: qty 2

## 2021-11-04 MED ORDER — DEXAMETHASONE SODIUM PHOSPHATE 10 MG/ML IJ SOLN
INTRAMUSCULAR | Status: DC | PRN
Start: 2021-11-04 — End: 2021-11-04
  Administered 2021-11-04: 10 mg via INTRAVENOUS

## 2021-11-04 MED ORDER — LIDOCAINE HCL 2 % IJ SOLN
INTRAMUSCULAR | Status: AC
  Filled 2021-11-04: qty 20

## 2021-11-04 MED ORDER — FENTANYL CITRATE (PF) 250 MCG/5ML IJ SOLN
INTRAMUSCULAR | Status: AC
  Filled 2021-11-04: qty 5

## 2021-11-04 MED ORDER — METHYLENE BLUE 0.5 % INJ SOLN
INTRAVENOUS | Status: AC
  Filled 2021-11-04: qty 10

## 2021-11-04 MED ORDER — SODIUM CHLORIDE 0.9 % IR SOLN
Status: DC | PRN
  Administered 2021-11-04: 1000 mL via INTRAVESICAL

## 2021-11-04 MED ORDER — ALVIMOPAN 12 MG PO CAPS: 12.0000 mg | ORAL_CAPSULE | Freq: Two times a day (BID) | ORAL | Status: DC

## 2021-11-04 MED ORDER — HEPARIN SODIUM (PORCINE) 5000 UNIT/ML IJ SOLN
5000.0000 [IU] | Freq: Once | INTRAMUSCULAR | Status: AC
  Administered 2021-11-04: 5000 [IU] via SUBCUTANEOUS
  Filled 2021-11-04: qty 1

## 2021-11-04 MED ORDER — KETAMINE HCL 10 MG/ML IJ SOLN
INTRAMUSCULAR | Status: DC | PRN
Start: 2021-11-04 — End: 2021-11-04
  Administered 2021-11-04: 50 mg via INTRAVENOUS

## 2021-11-04 MED ORDER — ENSURE PRE-SURGERY PO LIQD: 296.0000 mL | Freq: Once | ORAL | Status: DC

## 2021-11-04 MED ORDER — LIDOCAINE 2% (20 MG/ML) 5 ML SYRINGE
INTRAMUSCULAR | Status: DC | PRN
  Administered 2021-11-04: 80 mg via INTRAVENOUS

## 2021-11-04 MED ORDER — ENSURE SURGERY PO LIQD
237.0000 mL | Freq: Two times a day (BID) | ORAL | Status: DC
  Administered 2021-11-05 (×2): 237 mL via ORAL

## 2021-11-04 MED ORDER — TERBINAFINE HCL 250 MG PO TABS
250.0000 mg | ORAL_TABLET | Freq: Every day | ORAL | Status: DC
  Administered 2021-11-05: 250 mg via ORAL
  Filled 2021-11-04 (×2): qty 1

## 2021-11-04 MED ORDER — BUPIVACAINE-EPINEPHRINE (PF) 0.25% -1:200000 IJ SOLN
INTRAMUSCULAR | Status: AC
  Filled 2021-11-04: qty 30

## 2021-11-04 MED ORDER — STERILE WATER FOR INJECTION IJ SOLN
INTRAMUSCULAR | Status: DC | PRN
  Administered 2021-11-04: 15 mL via INTRAVESICAL

## 2021-11-04 MED ORDER — KETAMINE HCL 50 MG/5ML IJ SOSY
PREFILLED_SYRINGE | INTRAMUSCULAR | Status: AC
  Filled 2021-11-04: qty 5

## 2021-11-04 MED ORDER — ROCURONIUM BROMIDE 10 MG/ML (PF) SYRINGE
PREFILLED_SYRINGE | INTRAVENOUS | Status: DC | PRN
  Administered 2021-11-04: 10 mg via INTRAVENOUS
  Administered 2021-11-04: 60 mg via INTRAVENOUS
  Administered 2021-11-04 (×2): 20 mg via INTRAVENOUS

## 2021-11-04 MED ORDER — FENTANYL CITRATE PF 50 MCG/ML IJ SOSY
PREFILLED_SYRINGE | INTRAMUSCULAR | Status: AC
  Filled 2021-11-04: qty 3

## 2021-11-04 MED ORDER — ONDANSETRON HCL 4 MG PO TABS: 4.0000 mg | ORAL_TABLET | Freq: Four times a day (QID) | ORAL | Status: DC | PRN

## 2021-11-04 MED ORDER — TRAMADOL HCL 50 MG PO TABS
50.0000 mg | ORAL_TABLET | Freq: Four times a day (QID) | ORAL | Status: DC | PRN
  Administered 2021-11-04 – 2021-11-06 (×5): 50 mg via ORAL
  Filled 2021-11-04 (×5): qty 1

## 2021-11-04 MED ORDER — NEOMYCIN SULFATE 500 MG PO TABS: 1000.0000 mg | ORAL_TABLET | ORAL | Status: DC

## 2021-11-04 MED ORDER — LIDOCAINE 2% (20 MG/ML) 5 ML SYRINGE
INTRAMUSCULAR | Status: DC | PRN
  Administered 2021-11-04: 1.5 mg/kg/h via INTRAVENOUS

## 2021-11-04 MED ORDER — BISACODYL 5 MG PO TBEC: 20.0000 mg | DELAYED_RELEASE_TABLET | Freq: Once | ORAL | Status: DC

## 2021-11-04 MED ORDER — BUPIVACAINE LIPOSOME 1.3 % IJ SUSP
INTRAMUSCULAR | Status: AC
  Filled 2021-11-04: qty 20

## 2021-11-04 MED ORDER — CHLORHEXIDINE GLUCONATE CLOTH 2 % EX PADS: 6.0000 | MEDICATED_PAD | Freq: Once | CUTANEOUS | Status: DC

## 2021-11-04 MED ORDER — SODIUM CHLORIDE (PF) 0.9 % IJ SOLN
INTRAMUSCULAR | Status: AC
  Filled 2021-11-04: qty 10

## 2021-11-04 MED ORDER — GABAPENTIN 300 MG PO CAPS
300.0000 mg | ORAL_CAPSULE | Freq: Once | ORAL | Status: AC
  Administered 2021-11-04: 300 mg via ORAL
  Filled 2021-11-04: qty 1

## 2021-11-04 MED ORDER — CHLORHEXIDINE GLUCONATE 0.12 % MT SOLN
15.0000 mL | Freq: Once | OROMUCOSAL | Status: AC
  Administered 2021-11-04: 15 mL via OROMUCOSAL

## 2021-11-04 MED ORDER — ACETAMINOPHEN 500 MG PO TABS
1000.0000 mg | ORAL_TABLET | Freq: Once | ORAL | Status: AC
  Administered 2021-11-04: 1000 mg via ORAL
  Filled 2021-11-04: qty 2

## 2021-11-04 MED ORDER — FENTANYL CITRATE PF 50 MCG/ML IJ SOSY
25.0000 ug | PREFILLED_SYRINGE | INTRAMUSCULAR | Status: DC | PRN
  Administered 2021-11-04 (×3): 50 ug via INTRAVENOUS

## 2021-11-04 MED ORDER — METRONIDAZOLE 500 MG PO TABS: 1000.0000 mg | ORAL_TABLET | ORAL | Status: DC

## 2021-11-04 MED ORDER — INDOCYANINE GREEN 25 MG IV SOLR
INTRAVENOUS | Status: DC | PRN
  Administered 2021-11-04: 2.5 mg via INTRAVENOUS

## 2021-11-04 MED ORDER — INSULIN ASPART 100 UNIT/ML IJ SOLN
0.0000 [IU] | Freq: Every day | INTRAMUSCULAR | Status: DC
  Administered 2021-11-04 – 2021-11-05 (×2): 2 [IU] via SUBCUTANEOUS

## 2021-11-04 MED ORDER — ALUM & MAG HYDROXIDE-SIMETH 200-200-20 MG/5ML PO SUSP
30.0000 mL | Freq: Four times a day (QID) | ORAL | Status: DC | PRN
  Administered 2021-11-04: 30 mL via ORAL
  Filled 2021-11-04: qty 30

## 2021-11-04 MED ORDER — ORAL CARE MOUTH RINSE: 15.0000 mL | Freq: Once | OROMUCOSAL | Status: AC

## 2021-11-04 MED ORDER — EPHEDRINE 5 MG/ML INJ
INTRAVENOUS | Status: AC
  Filled 2021-11-04: qty 5

## 2021-11-04 MED ORDER — INSULIN ASPART 100 UNIT/ML IJ SOLN
0.0000 [IU] | Freq: Three times a day (TID) | INTRAMUSCULAR | Status: DC
  Administered 2021-11-04: 3 [IU] via SUBCUTANEOUS
  Administered 2021-11-05 – 2021-11-06 (×4): 1 [IU] via SUBCUTANEOUS

## 2021-11-04 MED ORDER — ROCURONIUM BROMIDE 10 MG/ML (PF) SYRINGE
PREFILLED_SYRINGE | INTRAVENOUS | Status: AC
  Filled 2021-11-04: qty 10

## 2021-11-04 MED ORDER — IBUPROFEN 200 MG PO TABS
600.0000 mg | ORAL_TABLET | Freq: Four times a day (QID) | ORAL | Status: DC | PRN
  Administered 2021-11-04 – 2021-11-05 (×2): 600 mg via ORAL
  Filled 2021-11-04 (×2): qty 3

## 2021-11-04 MED ORDER — PANTOPRAZOLE SODIUM 40 MG PO TBEC
40.0000 mg | DELAYED_RELEASE_TABLET | Freq: Every day | ORAL | Status: DC
  Administered 2021-11-05 – 2021-11-06 (×2): 40 mg via ORAL
  Filled 2021-11-04 (×2): qty 1

## 2021-11-04 MED ORDER — LACTATED RINGERS IV SOLN: INTRAVENOUS | Status: DC

## 2021-11-04 MED ORDER — PROPOFOL 10 MG/ML IV BOLUS
INTRAVENOUS | Status: DC | PRN
  Administered 2021-11-04: 150 mg via INTRAVENOUS

## 2021-11-04 MED ORDER — HYDROMORPHONE HCL 1 MG/ML IJ SOLN
0.5000 mg | INTRAMUSCULAR | Status: DC | PRN
  Administered 2021-11-04 (×2): 0.5 mg via INTRAVENOUS
  Filled 2021-11-04 (×2): qty 0.5

## 2021-11-04 MED ORDER — PROPOFOL 10 MG/ML IV BOLUS
INTRAVENOUS | Status: AC
Start: 2021-11-04 — End: ?
  Filled 2021-11-04: qty 20

## 2021-11-04 MED ORDER — SIMETHICONE 80 MG PO CHEW: 40.0000 mg | CHEWABLE_TABLET | Freq: Four times a day (QID) | ORAL | Status: DC | PRN

## 2021-11-04 MED ORDER — POLYETHYLENE GLYCOL 3350 17 GM/SCOOP PO POWD: 1.0000 | Freq: Once | ORAL | Status: DC

## 2021-11-04 MED ORDER — DIPHENHYDRAMINE HCL 50 MG/ML IJ SOLN: 12.5000 mg | Freq: Four times a day (QID) | INTRAMUSCULAR | Status: DC | PRN

## 2021-11-04 MED ORDER — ACETAMINOPHEN 500 MG PO TABS
1000.0000 mg | ORAL_TABLET | Freq: Four times a day (QID) | ORAL | Status: DC
  Administered 2021-11-04 – 2021-11-06 (×7): 1000 mg via ORAL
  Filled 2021-11-04 (×8): qty 2

## 2021-11-04 MED ORDER — SODIUM CHLORIDE 0.9 % IV SOLN
2.0000 g | INTRAVENOUS | Status: AC
  Administered 2021-11-04: 2 g via INTRAVENOUS
  Filled 2021-11-04: qty 2

## 2021-11-04 MED ORDER — LACTATED RINGERS IV SOLN: INTRAVENOUS | Status: DC | PRN

## 2021-11-04 MED ORDER — LACTATED RINGERS IR SOLN
Status: DC | PRN
  Administered 2021-11-04: 1000 mL

## 2021-11-04 MED ORDER — FENTANYL CITRATE (PF) 250 MCG/5ML IJ SOLN
INTRAMUSCULAR | Status: DC | PRN
  Administered 2021-11-04 (×6): 50 ug via INTRAVENOUS
  Administered 2021-11-04: 100 ug via INTRAVENOUS

## 2021-11-04 MED ORDER — ONDANSETRON HCL 4 MG/2ML IJ SOLN
INTRAMUSCULAR | Status: AC
  Filled 2021-11-04: qty 2

## 2021-11-04 MED ORDER — HYDRALAZINE HCL 20 MG/ML IJ SOLN: 10.0000 mg | INTRAMUSCULAR | Status: DC | PRN

## 2021-11-04 MED ORDER — MIDAZOLAM HCL 2 MG/2ML IJ SOLN
INTRAMUSCULAR | Status: DC | PRN
  Administered 2021-11-04: 2 mg via INTRAVENOUS

## 2021-11-04 MED ORDER — BUPIVACAINE-EPINEPHRINE (PF) 0.25% -1:200000 IJ SOLN
INTRAMUSCULAR | Status: DC | PRN
  Administered 2021-11-04: 30 mL via PERINEURAL

## 2021-11-04 MED ORDER — STERILE WATER FOR INJECTION IJ SOLN
INTRAMUSCULAR | Status: AC
  Filled 2021-11-04: qty 10

## 2021-11-04 MED ORDER — EPHEDRINE SULFATE-NACL 50-0.9 MG/10ML-% IV SOSY
PREFILLED_SYRINGE | INTRAVENOUS | Status: DC | PRN
  Administered 2021-11-04 (×2): 5 mg via INTRAVENOUS

## 2021-11-04 MED ORDER — SUGAMMADEX SODIUM 200 MG/2ML IV SOLN
INTRAVENOUS | Status: DC | PRN
  Administered 2021-11-04: 200 mg via INTRAVENOUS

## 2021-11-04 MED ORDER — ONDANSETRON HCL 4 MG/2ML IJ SOLN: 4.0000 mg | Freq: Four times a day (QID) | INTRAMUSCULAR | Status: DC | PRN

## 2021-11-04 MED ORDER — ALVIMOPAN 12 MG PO CAPS
12.0000 mg | ORAL_CAPSULE | ORAL | Status: AC
  Administered 2021-11-04: 12 mg via ORAL
  Filled 2021-11-04: qty 1

## 2021-11-04 MED ORDER — ENSURE PRE-SURGERY PO LIQD: 592.0000 mL | Freq: Once | ORAL | Status: DC

## 2021-11-04 MED ORDER — AMISULPRIDE (ANTIEMETIC) 5 MG/2ML IV SOLN: 10.0000 mg | Freq: Once | INTRAVENOUS | Status: DC | PRN

## 2021-11-04 MED ORDER — LIDOCAINE HCL (PF) 2 % IJ SOLN
INTRAMUSCULAR | Status: AC
  Filled 2021-11-04: qty 5

## 2021-11-04 MED ORDER — ONDANSETRON HCL 4 MG/2ML IJ SOLN
INTRAMUSCULAR | Status: DC | PRN
  Administered 2021-11-04: 4 mg via INTRAVENOUS

## 2021-11-04 MED ORDER — HEPARIN SODIUM (PORCINE) 5000 UNIT/ML IJ SOLN
5000.0000 [IU] | Freq: Three times a day (TID) | INTRAMUSCULAR | Status: DC
  Administered 2021-11-05 – 2021-11-06 (×4): 5000 [IU] via SUBCUTANEOUS
  Filled 2021-11-04 (×4): qty 1

## 2021-11-04 MED ORDER — AMLODIPINE BESYLATE 10 MG PO TABS
10.0000 mg | ORAL_TABLET | Freq: Every day | ORAL | Status: DC
  Administered 2021-11-05 – 2021-11-06 (×2): 10 mg via ORAL
  Filled 2021-11-04 (×2): qty 1

## 2021-11-04 MED ORDER — DIPHENHYDRAMINE HCL 12.5 MG/5ML PO ELIX: 12.5000 mg | ORAL_SOLUTION | Freq: Four times a day (QID) | ORAL | Status: DC | PRN

## 2021-11-04 MED ORDER — LIDOCAINE 2% (20 MG/ML) 5 ML SYRINGE: INTRAMUSCULAR | Status: DC | PRN

## 2021-11-04 MED ORDER — DEXAMETHASONE SODIUM PHOSPHATE 10 MG/ML IJ SOLN
INTRAMUSCULAR | Status: AC
  Filled 2021-11-04: qty 1

## 2021-11-04 MED ORDER — BUPIVACAINE LIPOSOME 1.3 % IJ SUSP
INTRAMUSCULAR | Status: DC | PRN
  Administered 2021-11-04: 20 mL

## 2021-11-04 SURGICAL SUPPLY — 134 items
ADAPTER GOLDBERG URETERAL (ADAPTER) IMPLANT
ADH SKN CLS APL DERMABOND .7 (GAUZE/BANDAGES/DRESSINGS) ×1
ADPR CATH 15X14FR FL DRN BG (ADAPTER)
APL PRP STRL LF DISP 70% ISPRP (MISCELLANEOUS) ×1
APPLIER CLIP 5 13 M/L LIGAMAX5 (MISCELLANEOUS)
APPLIER CLIP ROT 10 11.4 M/L (STAPLE)
APR CLP MED LRG 11.4X10 (STAPLE)
APR CLP MED LRG 5 ANG JAW (MISCELLANEOUS)
BAG COUNTER SPONGE SURGICOUNT (BAG) IMPLANT
BAG SPNG CNTER NS LX DISP (BAG)
BAG URO CATCHER STRL LF (MISCELLANEOUS) ×2 IMPLANT
BLADE EXTENDED COATED 6.5IN (ELECTRODE) ×2 IMPLANT
CANNULA REDUC XI 12-8 STAPL (CANNULA) ×2
CANNULA REDUCER 12-8 DVNC XI (CANNULA) ×1 IMPLANT
CATH URETL OPEN END 6FR 70 (CATHETERS) IMPLANT
CELLS DAT CNTRL 66122 CELL SVR (MISCELLANEOUS) IMPLANT
CHLORAPREP W/TINT 26 (MISCELLANEOUS) ×2 IMPLANT
CLIP APPLIE 5 13 M/L LIGAMAX5 (MISCELLANEOUS) IMPLANT
CLIP APPLIE ROT 10 11.4 M/L (STAPLE) IMPLANT
CLIP LIGATING HEM O LOK PURPLE (MISCELLANEOUS) IMPLANT
CLIP LIGATING HEMO O LOK GREEN (MISCELLANEOUS) IMPLANT
CLOTH BEACON ORANGE TIMEOUT ST (SAFETY) ×2 IMPLANT
COVER SURGICAL LIGHT HANDLE (MISCELLANEOUS) ×4 IMPLANT
COVER TIP SHEARS 8 DVNC (MISCELLANEOUS) ×1 IMPLANT
COVER TIP SHEARS 8MM DA VINCI (MISCELLANEOUS) ×2
DEFOGGER SCOPE WARMER CLEARIFY (MISCELLANEOUS) ×2 IMPLANT
DERMABOND ADVANCED (GAUZE/BANDAGES/DRESSINGS) ×1
DERMABOND ADVANCED .7 DNX12 (GAUZE/BANDAGES/DRESSINGS) IMPLANT
DEVICE TROCAR PUNCTURE CLOSURE (ENDOMECHANICALS) IMPLANT
DRAIN CHANNEL 19F RND (DRAIN) ×2 IMPLANT
DRAPE ARM DVNC X/XI (DISPOSABLE) ×4 IMPLANT
DRAPE COLUMN DVNC XI (DISPOSABLE) ×1 IMPLANT
DRAPE DA VINCI XI ARM (DISPOSABLE) ×8
DRAPE DA VINCI XI COLUMN (DISPOSABLE) ×2
DRAPE SURG IRRIG POUCH 19X23 (DRAPES) ×2 IMPLANT
DRSG OPSITE POSTOP 4X10 (GAUZE/BANDAGES/DRESSINGS) IMPLANT
DRSG OPSITE POSTOP 4X6 (GAUZE/BANDAGES/DRESSINGS) IMPLANT
DRSG OPSITE POSTOP 4X8 (GAUZE/BANDAGES/DRESSINGS) IMPLANT
DRSG TEGADERM 2-3/8X2-3/4 SM (GAUZE/BANDAGES/DRESSINGS) ×10 IMPLANT
DRSG TEGADERM 4X4.75 (GAUZE/BANDAGES/DRESSINGS) ×2 IMPLANT
ELECT REM PT RETURN 15FT ADLT (MISCELLANEOUS) ×2 IMPLANT
ENDOLOOP SUT PDS II  0 18 (SUTURE)
ENDOLOOP SUT PDS II 0 18 (SUTURE) IMPLANT
EVACUATOR SILICONE 100CC (DRAIN) ×2 IMPLANT
GAUZE SPONGE 2X2 8PLY STRL LF (GAUZE/BANDAGES/DRESSINGS) ×1 IMPLANT
GAUZE SPONGE 4X4 12PLY STRL (GAUZE/BANDAGES/DRESSINGS) IMPLANT
GLOVE SURG ENC MOIS LTX SZ7.5 (GLOVE) ×6 IMPLANT
GLOVE SURG ENC TEXT LTX SZ7.5 (GLOVE) ×2 IMPLANT
GLOVE SURG UNDER LTX SZ8 (GLOVE) ×6 IMPLANT
GOWN STRL REUS W/ TWL XL LVL3 (GOWN DISPOSABLE) ×5 IMPLANT
GOWN STRL REUS W/TWL XL LVL3 (GOWN DISPOSABLE) ×10
GRASPER SUT TROCAR 14GX15 (MISCELLANEOUS) IMPLANT
GUIDEWIRE ANG ZIPWIRE 038X150 (WIRE) IMPLANT
GUIDEWIRE STR DUAL SENSOR (WIRE) IMPLANT
HOLDER FOLEY CATH W/STRAP (MISCELLANEOUS) ×2 IMPLANT
IRRIG SUCT STRYKERFLOW 2 WTIP (MISCELLANEOUS) ×2
IRRIGATION SUCT STRKRFLW 2 WTP (MISCELLANEOUS) ×1 IMPLANT
KIT PROCEDURE DA VINCI SI (MISCELLANEOUS) ×2
KIT PROCEDURE DVNC SI (MISCELLANEOUS) ×1 IMPLANT
KIT TURNOVER KIT A (KITS) IMPLANT
MANIFOLD NEPTUNE II (INSTRUMENTS) ×2 IMPLANT
NDL INSUFFLATION 14GA 120MM (NEEDLE) ×1 IMPLANT
NEEDLE INSUFFLATION 14GA 120MM (NEEDLE) ×2 IMPLANT
PACK CARDIOVASCULAR III (CUSTOM PROCEDURE TRAY) ×2 IMPLANT
PACK COLON (CUSTOM PROCEDURE TRAY) ×2 IMPLANT
PACK CYSTO (CUSTOM PROCEDURE TRAY) ×2 IMPLANT
PAD POSITIONING PINK XL (MISCELLANEOUS) ×2 IMPLANT
PENCIL SMOKE EVACUATOR (MISCELLANEOUS) IMPLANT
PROTECTOR NERVE ULNAR (MISCELLANEOUS) ×4 IMPLANT
RELOAD STAPLE 45 3.5 BLU DVNC (STAPLE) IMPLANT
RELOAD STAPLE 45 4.3 GRN DVNC (STAPLE) IMPLANT
RELOAD STAPLE 60 3.5 BLU DVNC (STAPLE) IMPLANT
RELOAD STAPLE 60 4.3 GRN DVNC (STAPLE) IMPLANT
RELOAD STAPLER 3.5X45 BLU DVNC (STAPLE) IMPLANT
RELOAD STAPLER 3.5X60 BLU DVNC (STAPLE) IMPLANT
RELOAD STAPLER 4.3X45 GRN DVNC (STAPLE) IMPLANT
RELOAD STAPLER 4.3X60 GRN DVNC (STAPLE) ×2 IMPLANT
RETRACTOR WND ALEXIS 18 MED (MISCELLANEOUS) IMPLANT
RTRCTR WOUND ALEXIS 18CM MED (MISCELLANEOUS)
SCISSORS LAP 5X35 DISP (ENDOMECHANICALS) IMPLANT
SEAL CANN UNIV 5-8 DVNC XI (MISCELLANEOUS) ×4 IMPLANT
SEAL XI 5MM-8MM UNIVERSAL (MISCELLANEOUS) ×8
SEALER VESSEL DA VINCI XI (MISCELLANEOUS) ×2
SEALER VESSEL EXT DVNC XI (MISCELLANEOUS) ×1 IMPLANT
SLEEVE ADV FIXATION 5X100MM (TROCAR) IMPLANT
SOLUTION ELECTROLUBE (MISCELLANEOUS) ×2 IMPLANT
SPIKE FLUID TRANSFER (MISCELLANEOUS) ×2 IMPLANT
SPONGE GAUZE 2X2 STER 10/PKG (GAUZE/BANDAGES/DRESSINGS) ×1
STAPLER 60 DA VINCI SURE FORM (STAPLE) ×2
STAPLER 60 SUREFORM DVNC (STAPLE) IMPLANT
STAPLER CANNULA SEAL DVNC XI (STAPLE) ×1 IMPLANT
STAPLER CANNULA SEAL XI (STAPLE) ×2
STAPLER ECHELON POWER CIR 29 (STAPLE) ×1 IMPLANT
STAPLER ECHELON POWER CIR 31 (STAPLE) IMPLANT
STAPLER RELOAD 3.5X45 BLU DVNC (STAPLE)
STAPLER RELOAD 3.5X45 BLUE (STAPLE)
STAPLER RELOAD 3.5X60 BLU DVNC (STAPLE)
STAPLER RELOAD 3.5X60 BLUE (STAPLE)
STAPLER RELOAD 4.3X45 GREEN (STAPLE)
STAPLER RELOAD 4.3X45 GRN DVNC (STAPLE)
STAPLER RELOAD 4.3X60 GREEN (STAPLE) ×4
STAPLER RELOAD 4.3X60 GRN DVNC (STAPLE) ×2
STOPCOCK 4 WAY LG BORE MALE ST (IV SETS) ×4 IMPLANT
SURGILUBE 2OZ TUBE FLIPTOP (MISCELLANEOUS) ×2 IMPLANT
SUT MNCRL AB 4-0 PS2 18 (SUTURE) ×2 IMPLANT
SUT PDS AB 1 CT1 27 (SUTURE) IMPLANT
SUT PDS AB 1 TP1 96 (SUTURE) IMPLANT
SUT PROLENE 0 CT 2 (SUTURE) IMPLANT
SUT PROLENE 2 0 KS (SUTURE) ×2 IMPLANT
SUT PROLENE 2 0 SH DA (SUTURE) IMPLANT
SUT SILK 2 0 (SUTURE)
SUT SILK 2 0 SH CR/8 (SUTURE) ×1 IMPLANT
SUT SILK 2-0 18XBRD TIE 12 (SUTURE) IMPLANT
SUT SILK 3 0 (SUTURE) ×2
SUT SILK 3 0 SH CR/8 (SUTURE) ×2 IMPLANT
SUT SILK 3-0 18XBRD TIE 12 (SUTURE) ×1 IMPLANT
SUT V-LOC BARB 180 2/0GR6 GS22 (SUTURE)
SUT VIC AB 3-0 SH 18 (SUTURE) IMPLANT
SUT VIC AB 3-0 SH 27 (SUTURE)
SUT VIC AB 3-0 SH 27XBRD (SUTURE) IMPLANT
SUT VICRYL 0 UR6 27IN ABS (SUTURE) ×2 IMPLANT
SUTURE V-LC BRB 180 2/0GR6GS22 (SUTURE) IMPLANT
SYR 10ML LL (SYRINGE) ×2 IMPLANT
SYS LAPSCP GELPORT 120MM (MISCELLANEOUS)
SYS WOUND ALEXIS 18CM MED (MISCELLANEOUS) ×2
SYSTEM LAPSCP GELPORT 120MM (MISCELLANEOUS) IMPLANT
SYSTEM WOUND ALEXIS 18CM MED (MISCELLANEOUS) ×1 IMPLANT
TAPE UMBILICAL 1/8 X36 TWILL (MISCELLANEOUS) ×2 IMPLANT
TOWEL OR NON WOVEN STRL DISP B (DISPOSABLE) ×2 IMPLANT
TRAY FOLEY MTR SLVR 16FR STAT (SET/KITS/TRAYS/PACK) ×2 IMPLANT
TROCAR ADV FIXATION 5X100MM (TROCAR) ×2 IMPLANT
TUBING CONNECTING 10 (TUBING) ×6 IMPLANT
TUBING INSUFFLATION 10FT LAP (TUBING) ×2 IMPLANT
TUBING UROLOGY SET (TUBING) IMPLANT

## 2021-11-04 NOTE — Anesthesia Preprocedure Evaluation (Addendum)
Anesthesia Evaluation  ?Patient identified by MRN, date of birth, ID band ?Patient awake ? ? ? ?Reviewed: ?Allergy & Precautions, NPO status , Patient's Chart, lab work & pertinent test results ? ?Airway ?Mallampati: II ? ?TM Distance: >3 FB ?Neck ROM: Full ? ? ? Dental ? ?(+) Dental Advisory Given ?  ?Pulmonary ?Current Smoker and Patient abstained from smoking.,  ?  ?breath sounds clear to auscultation ? ? ? ? ? ? Cardiovascular ?hypertension, Pt. on medications ? ?Rhythm:Regular Rate:Normal ? ? ?  ?Neuro/Psych ?negative neurological ROS ?   ? GI/Hepatic ?Neg liver ROS, GERD  Medicated,  ?Endo/Other  ?negative endocrine ROS ? Renal/GU ?negative Renal ROS  ? ?  ?Musculoskeletal ? ? Abdominal ?  ?Peds ? Hematology ?negative hematology ROS ?(+)   ?Anesthesia Other Findings ? ? Reproductive/Obstetrics ? ?  ? ? ? ? ? ? ? ? ? ? ? ? ? ?  ?  ? ? ? ? ? ? ? ? ?Lab Results  ?Component Value Date  ? WBC 6.6 10/28/2021  ? HGB 12.6 10/28/2021  ? HCT 39.6 10/28/2021  ? MCV 68.3 (L) 10/28/2021  ? PLT 306 10/28/2021  ? ?Lab Results  ?Component Value Date  ? CREATININE 0.69 10/28/2021  ? BUN 10 10/28/2021  ? NA 139 10/28/2021  ? K 4.3 10/28/2021  ? CL 107 10/28/2021  ? CO2 23 10/28/2021  ? ? ?Anesthesia Physical ?Anesthesia Plan ? ?ASA: 2 ? ?Anesthesia Plan: General  ? ?Post-op Pain Management: Tylenol PO (pre-op)*, Gabapentin PO (pre-op)*, Toradol IV (intra-op)* and Ketamine IV*  ? ?Induction: Intravenous ? ?PONV Risk Score and Plan: 3 and Dexamethasone, Ondansetron, Treatment may vary due to age or medical condition and Midazolam ? ?Airway Management Planned: Oral ETT ? ?Additional Equipment: None ? ?Intra-op Plan:  ? ?Post-operative Plan: Extubation in OR ? ?Informed Consent: I have reviewed the patients History and Physical, chart, labs and discussed the procedure including the risks, benefits and alternatives for the proposed anesthesia with the patient or authorized representative who has  indicated his/her understanding and acceptance.  ? ? ? ?Dental advisory given ? ?Plan Discussed with: CRNA ? ?Anesthesia Plan Comments:   ? ? ? ? ? ?Anesthesia Quick Evaluation ? ?

## 2021-11-04 NOTE — Progress Notes (Signed)
?  Transition of Care (TOC) Screening Note ? ? ?Patient Details  ?Name: Amanda Garner ?Date of Birth: 1971-08-05 ? ? ?Transition of Care (TOC) CM/SW Contact:    ?Anaika Santillano, LCSW ?Phone Number: ?11/04/2021, 3:04 PM ? ? ? ?Transition of Care Department [facility]) has reviewed patient and no TOC needs have been identified at this time. We will continue to monitor patient advancement through interdisciplinary progression rounds. If new patient transition needs arise, please place a TOC consult. ? ? ?

## 2021-11-04 NOTE — H&P (Signed)
H&P ?Physician requesting consult: Nadeen Landau ? ?Chief Complaint: Colovaginal fistula ? ?History of Present Illness: 51 year old female with colovaginal fistula presents for takedown of colovaginal fistula with possible small bowel resection.  Cystoscopy with firefly and ureteral stent placement requested. ? ?Past Medical History:  ?Diagnosis Date  ? Acid reflux   ? Blood transfusion   ? Diverticulitis   ? Hypertension   ? Sickle cell trait ([place])   ? ?Past Surgical History:  ?Procedure Laterality Date  ? ABDOMINAL HYSTERECTOMY  2001  ? partial hysterectomy - emergency during last C-section  ? [place]; 1997; 2001  ? CESAREAN SECTION    ? ? ?Home Medications:  ?Medications Prior to Admission  ?Medication Sig Dispense Refill Last Dose  ? amLODipine (NORVASC) 10 MG tablet Take 1 tablet (10 mg total) by mouth daily. 90 tablet 1 11/03/2021  ? pantoprazole (PROTONIX) 40 MG tablet Take 1 tablet (40 mg total) by mouth daily. 90 tablet 1 11/03/2021  ? terbinafine (LAMISIL) 250 MG tablet Take 1 tablet (250 mg total) by mouth daily. 60 tablet 0 11/03/2021  ? ?Allergies: No Known Allergies ? ?Family History  ?Problem Relation Age of Onset  ? Hypertension Mother   ? Diabetes Mother   ? Diabetes type II Mother   ? Diabetes type II Father   ? Stroke Father   ? Lung cancer Father 12  ? Esophageal cancer Maternal Grandfather   ? Colon cancer Neg Hx   ? Pancreatic cancer Neg Hx   ? Stomach cancer Neg Hx   ? ?Social History:  reports that she has been smoking cigarettes. She has a 15.50 pack-year smoking history. She has never used smokeless tobacco. She reports current alcohol use of about 1.0 standard drink per week. She reports current drug use. Frequency: 7.00 times per week. Drug: Marijuana. ? ?ROS: ?A complete review of systems was performed.  All systems are negative except for pertinent findings as noted. ?ROS ? ? ?Physical Exam:  ?Vital signs in last 24 hours: ?Temp:  [98.8 ?F (37.1 ?C)] 98.8 ?F (37.1 ?C)  (03/15 0645) ?Pulse Rate:  [93] 93 (03/15 0645) ?Resp:  [16] 16 (03/15 0645) ?BP: (137)/(89) 137/89 (03/15 0645) ?SpO2:  [99 %] 99 % (03/15 0645) ?General:  Alert and oriented, No acute distress ?HEENT: Normocephalic, atraumatic ?Neck: No JVD or lymphadenopathy ?Cardiovascular: Regular rate and rhythm ?Lungs: Regular rate and effort ?Abdomen: Soft, nontender, nondistended, no abdominal masses ?Back: No CVA tenderness ?Extremities: No edema ?Neurologic: Grossly intact ? ?Laboratory Data:  ?Results for orders placed or performed during the hospital encounter of 11/04/21 (from the past 24 hour(s))  ?ABO/Rh     Status: None  ? Collection Time: 11/04/21  6:35 AM  ?Result Value Ref Range  ? ABO/RH(D)    ?  O POS ?Performed at [facility], [place] [address], [place], [place] [address] ?  ? ?Recent Results (from the past 240 hour(s))  ?Surgical PCR Screen     Status: None  ? Collection Time: 10/28/21 11:01 AM  ? Specimen: Nasal Mucosa; Nasal Swab  ?Result Value Ref Range Status  ? MRSA, PCR NEGATIVE NEGATIVE Final  ? Staphylococcus aureus NEGATIVE NEGATIVE Final  ?  Comment: (NOTE) ?The Xpert SA Assay (FDA approved for NASAL specimens in patients 76 ?years of age and older), is one component of a comprehensive ?surveillance program. It is not intended to diagnose infection nor to ?guide or monitor treatment. ?Performed at [facility], [address] ?[place], [place] [address] ?  ?SARS CORONAVIRUS 2 (TAT 6-24 HRS) Nasopharyngeal Nasopharyngeal Swab     Status: None  ? Collection Time: 11/02/21 10:48 AM  ? Specimen: Nasopharyngeal Swab  ?Result Value Ref Range Status  ? SARS Coronavirus 2 NEGATIVE NEGATIVE Final  ?  Comment: (NOTE) ?SARS-CoV-2 target nucleic acids are NOT DETECTED. ? ?The SARS-CoV-2 RNA is generally detectable in upper and lower ?respiratory specimens during the acute phase of infection. Negative ?results do not preclude SARS-CoV-2 infection, do not rule  out ?co-infections with other pathogens, and should not be used as the ?sole basis for treatment or other patient management decisions. ?Negative results must be combined with clinical observations, ?patient history, and epidemiological information. The expected ?result is Negative. ? ?Fact Sheet for Patients: ?SugarRoll.be ? ?Fact Sheet for Healthcare Providers: ?https://www.woods-mathews.com/ ? ?This test is not yet approved or cleared by the [place] FDA and  ?has been authorized for detection and/or diagnosis of SARS-CoV-2 by ?FDA under an Emergency Use Authorization (EUA). This EUA will remain  ?in effect (meaning this test can be used) for the duration of the ?COVID-19 declaration under Se ction 564(b)(1) of the Act, 21 U.S.C. ?section 360bbb-3(b)(1), unless the authorization is terminated or ?revoked sooner. ? ?Performed at [facility], [place] [address], [place], [place] ?[address] ?  ? ?Creatinine: ?Recent Labs  ?  10/28/21 ?1028  ?CREATININE 0.69  ? ? ?Impression/Assessment:  ?Colovaginal fistula ? ?Plan:  ?Proceed with cystoscopy with bilateral temporary ureteral catheterization and firefly instillation. ? ?Marton Redwood, III ?11/04/2021, 8:32 AM  ? ?

## 2021-11-04 NOTE — H&P (Signed)
? ?CC: Here today for surgery ? ?HPI: ?Amanda Garner is an 51 y.o. female with history of HTN, whom is seen in the office today as a referral for evaluation of possible colovaginal fistula. ? ?She reports an approximate 5 to 6-year history where she will have severe cramps in her left lower quadrant and pelvis. She reports she has been diagnosed with diverticulitis multiple times. She has been prescribed antibiotics and this generally resolves over the ensuing days to a week. She believes she is had at least 5 or 6 attacks. Her most recent attack which is approximately 3 months ago and resulted in her trip to the emergency department was also around the time that she began having significant drainage from the vagina. She reports that she passes gas through the vagina and additionally passes stool. She will wear a pad and noted significant fecal soilage coming from her vagina. She denies any urinary tract infections. She denies any pneumaturia or brown/turbid urine. She denies any dysuria. ? ?CT A/P 04/01/21 which is being done for abdominal pain in the emergency room demonstrated extensive diverticulosis of the sigmoid colon similar in appearance to that in May. No mesenteric inflammation to suggest active diverticulitis. Sigmoid is chronically inseparable from the vaginal cuff and a small volume of gas has been noted within the vagina or at the cuff on these CAT scans, suspicious for a colovaginal fistula. ? 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 She does not have a PCP.  ? ?CT A/P 06/30/2021 demonstrated bowel wall thickening involving the distal ileum in the mid to lower abdomen with surrounding fat stranding suggesting infectious or inflammatory enteritis. Colonic diverticulosis with bowel wall thickening and mild surrounding fat stranding in the sigmoid colon suggesting  diverticulitis. ? ?Cscope with Dr. Hilarie Fredrickson 08/28/21 -scattered inflammation in the terminal ileum. Biopsies were taken. Multiple diverticulosis. Narrowing of the colon in association with diverticular opening in the distal sigmoid. No convincing evidence of Crohn's. Biopsies were taken. ? ?Pathology showed benign ileal mucosa with no diagnostic abnormality. Small polyp removed from the sigmoid as a tubular adenoma. Random biopsies all returned with benign colonic mucosa without abnormality. ? ? ?INTERVAL HX ?No changes reported in her health or health history - states she tolerated her bowel prep with satisfactory result. Quit smoking a week ago. Clearly understands potential risks as outlined previously including for infectious complications, anastomotic leaks, scenarios where an ostomy could be indicated. ? ?PMH: HTN ? ?PSH: C-hyst 21 years ago for hemorrhage. This was through a Pfannenstiel incision ? ?FHx: Denies any known family history of colorectal, breast, endometrial or ovarian cancer ? ?Social Hx: Denies use of EtOH/illicit drug. 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 ?Past Medical History:  ?Diagnosis Date  ? Acid reflux   ? Blood transfusion   ? Diverticulitis   ? Hypertension   ? Sickle cell trait ([place])   ? ? ?Past Surgical History:  ?Procedure Laterality Date  ? ABDOMINAL HYSTERECTOMY  2001  ? partial hysterectomy - emergency during last C-section  ? [place]; 1997; 2001  ? CESAREAN SECTION    ? ? ?Family History  ?Problem Relation Age of Onset  ? Hypertension Mother   ? Diabetes Mother   ? Diabetes type II Mother   ? Diabetes type II Father   ? Stroke Father   ?  Lung cancer Father 49  ? Esophageal cancer Maternal Grandfather   ? Colon cancer Neg Hx   ? Pancreatic cancer Neg Hx   ? Stomach cancer Neg Hx   ? ? ?Social:  reports that she has been smoking cigarettes. She has a 15.50 pack-year smoking history. She has never used smokeless tobacco. She reports current alcohol use of about 1.0  standard drink per week. She reports current drug use. Frequency: 7.00 times per week. Drug: Marijuana. ? ?Allergies: No Known Allergies ? ?Medications: I have reviewed the patient's current medications. ? ?Results for orders placed or performed during the hospital encounter of 11/04/21 (from the past 48 hour(s))  ?SARS CORONAVIRUS 2 (TAT 6-24 HRS) Nasopharyngeal Nasopharyngeal Swab     Status: None  ? Collection Time: 11/02/21 10:48 AM  ? Specimen: Nasopharyngeal Swab  ?Result Value Ref Range  ? SARS Coronavirus 2 NEGATIVE NEGATIVE  ?  Comment: (NOTE) ?SARS-CoV-2 target nucleic acids are NOT DETECTED. ? ?The SARS-CoV-2 RNA is generally detectable in upper and lower ?respiratory specimens during the acute phase of infection. Negative ?results do not preclude SARS-CoV-2 infection, do not rule out ?co-infections with other pathogens, and should not be used as the ?sole basis for treatment or other patient management decisions. ?Negative results must be combined with clinical observations, ?patient history, and epidemiological information. The expected ?result is Negative. ? ?Fact Sheet for Patients: ?SugarRoll.be ? ?Fact Sheet for Healthcare Providers: ?https://www.woods-mathews.com/ ? ?This test is not yet approved or cleared by the [place] FDA and  ?has been authorized for detection and/or diagnosis of SARS-CoV-2 by ?FDA under an Emergency Use Authorization (EUA). This EUA will remain  ?in effect (meaning this test can be used) for the duration of the ?COVID-19 declaration under Se ction 564(b)(1) of the Act, 21 U.S.C. ?section 360bbb-3(b)(1), unless the authorization is terminated or ?revoked sooner. ? ?Performed at [facility], [place] [address] [place], [place] ?[address] ?  ?ABO/Rh     Status: None  ? Collection Time: 11/04/21  6:35 AM  ?Result Value Ref Range  ? ABO/RH(D)    ?  O POS ?Performed at [facility], [place] [address]  [place], [place] [address] ?  ? ? ?No results found. ? ?ROS - all of the below systems have been reviewed with the patient and positives are indicated with bold text ?General: chills, fever or night sweats ?Eyes: blurry vision or double vision ?ENT: epistaxis or sore throat ?Allergy/Immunology: itchy/watery eyes or nasal congestion ?Hematologic/Lymphatic: bleeding problems, blood clots or swollen lymph nodes ?Endocrine: temperature intolerance or unexpected weight changes ?Breast: new or changing breast lumps or nipple discharge ?Resp: cough, shortness of breath, or wheezing ?CV: chest pain or dyspnea on exertion ?GI: as per HPI ?GU: dysuria, trouble voiding, or hematuria ?MSK: joint pain or joint stiffness ?Neuro: TIA or stroke symptoms ?Derm: pruritus and skin lesion changes ?Psych: anxiety and depression ? ?PE ?Blood pressure 137/89, pulse 93, temperature 98.8 ?F (37.1 ?C), temperature source Oral, resp. rate 16, last menstrual period 08/20/2011, SpO2 99 %. ?Constitutional: NAD; conversant ?Eyes: Moist conjunctiva; anicteric; PERRL ?Lungs: Normal respiratory effort ?CV: RRR; no palpable thrills; no pitting edema ?GI: Abd obese, soft, NT/ND; no palpable hepatosplenomegaly ?MSK: Normal range of motion of extremities ?Psychiatric: Appropriate affect; alert and oriented x3 ? ?Results for orders placed or performed during the hospital encounter of 11/04/21 (from the past 48 hour(s))  ?SARS CORONAVIRUS 2 (TAT 6-24 HRS) Nasopharyngeal Nasopharyngeal Swab     Status:  None  ? Collection Time: 11/02/21 10:48 AM  ? Specimen: Nasopharyngeal Swab  ?Result Value Ref Range  ? SARS Coronavirus 2 NEGATIVE NEGATIVE  ?  Comment: (NOTE) ?SARS-CoV-2 target nucleic acids are NOT DETECTED. ? ?The SARS-CoV-2 RNA is generally detectable in upper and lower ?respiratory specimens during the acute phase of infection. Negative ?results do not preclude SARS-CoV-2 infection, do not rule out ?co-infections with other pathogens, and should not be used  as the ?sole basis for treatment or other patient management decisions. ?Negative results must be combined with clinical observations, ?patient history, and epidemiological information. The expected ?result is Negative. ? ?Fact She

## 2021-11-04 NOTE — Anesthesia Postprocedure Evaluation (Signed)
Anesthesia Post Note ? ?Patient: Laketa Sandoz ? ?Procedure(s) Performed: XI ROBOTIC ASSISTED LOWER ANTERIOR RESECTION, RESECTION OF COLOVAGINAL FISTULA, BILATERAL TAP BLOCK, INJECTION OF FIREFLY ?FLEXIBLE SIGMOIDOSCOPY ?CYSTOSCOPY with FIREFLY INJECTION ? ?  ? ?Patient location during evaluation: PACU ?Anesthesia Type: General ?Level of consciousness: awake and alert ?Pain management: pain level controlled ?Vital Signs Assessment: post-procedure vital signs reviewed and stable ?Respiratory status: spontaneous breathing, nonlabored ventilation, respiratory function stable and patient connected to nasal cannula oxygen ?Cardiovascular status: blood pressure returned to baseline and stable ?Postop Assessment: no apparent nausea or vomiting ?Anesthetic complications: no ? ? ?No notable events documented. ? ?Last Vitals:  ?Vitals:  ? 11/04/21 1315 11/04/21 1330  ?BP: 123/81 121/90  ?Pulse: (!) 106 (!) 104  ?Resp: 11 11  ?Temp:    ?SpO2: 96% 97%  ?  ?Last Pain:  ?Vitals:  ? 11/04/21 1330  ?TempSrc:   ?PainSc: Asleep  ? ? ?  ?  ?  ?  ?  ?  ? ?Suzette Battiest E ? ? ? ? ?

## 2021-11-04 NOTE — Op Note (Signed)
PATIENT: Amanda Garner  51 y.o. female ? ?Patient Care Team: ?Gildardo Pounds, NP as PCP - General (Nurse Practitioner) ?Forrest Moron, MD (Internal Medicine) ? ?PREOP DIAGNOSIS: COLOVAGINAL FISTULA, DIVERTICULITIS ? ?POSTOP DIAGNOSIS: COLOVAGINAL FISTULA, DIVERTICULITIS ? ?PROCEDURE:  ?Robotic assisted low anterior resection with double stapled colorectal anastomosis ?Robotic assisted drainage of abscess between colon and vagina ?Takedown of colovaginal fistula ?Intraoperative assessment of perfusion using ICG fluorescence imaging ?Flexible sigmoidoscopy ?Bilateral transversus abdominus plane (TAP) blocks ? ?SURGEON: Sharon Mt. Tangela Dolliver, MD ? ?ASSISTANT: Leighton Ruff, MD ? ?ANESTHESIA: General endotracheal ? ?EBL: 100 mL ?Total I/O ?In: 1000 [I.V.:1000] ?Out: 155 [Urine:30; Blood:125] ? ?DRAINS: None ? ?SPECIMEN: Rectosigmoid colon - open end proximal ? ?COUNTS: Sponge, needle and instrument counts were reported correct x2 ? ?FINDINGS: Omental adhesions to the low midline abdomen consistent with her prior surgical history.  Very dense fibrotic sigmoid colon densely adherent to the midline pelvic structures.  There is an abscess cavity that contains approximately 2 cc of purulent fluid that is entered and drained.  There is an occult fistula extending presumably from this to the vagina.  The bladder was backfilled and demonstrated no leakage of dilute methylene blue.  Vaginal evaluation did not demonstrate any evident or obvious fistulous connection actively communicating with the peritoneal cavity.  A well perfused, tension free, hemostatic, air tight 29 mm EEA colorectal anastomosis fashioned 15 cm from the anal verge by flexible sigmoidoscopy.  Anatomically, this clearly represents the proximal aspect of the rectum. ? ? ?NARRATIVE: ?Informed consent was verified. The patient was taken to the operating room, placed supine on the operating table and SCD's were applied. General endotracheal anesthesia was  induced without difficulty. She was then positioned in the lithotomy position with Allen stirrups.  Pressure points were evaluated and padded.  A foley catheter was then placed by nursing under sterile conditions. Hair on the abdomen was clipped.  She was secured to the operating table. Dr. Gloriann Loan with [facility] scrubbed for his portion of the procedure.  Please refer to his notes for details regarding his portion. The abdomen was then prepped and draped in the standard sterile fashion. Surgical timeout was called indicating the correct patient, procedure, positioning and need for preoperative antibiotics. ?  ?An OG tube was placed by anesthesia and confirmed to be to suction.  At Palmer's point, a stab incision was created and the Veress needle was introduced into the peritoneal cavity on the first attempt.  Intraperitoneal location was confirmed by the aspiration and saline drop test.  Pneumoperitoneum was established to a maximum pressure of 15 mmHg using CO2.  Following this, the abdomen was marked for planned trocar sites.  Just to the right and cephalad to the umbilicus, an 8 mm incision was created and an 8 mm blunt tipped robotic trocar was cautiously placed into the peritoneal cavity.  The laparoscope was inserted and demonstrated no evidence of trocar site nor Veress needle site complications.  The Veress needle was removed.  Bilateral transversus abdominis plane blocks were then created using a dilute mixture of Exparel with Marcaine.  3 additional 8 mm robotic trochars were placed under direct visualization roughly in a line extending from the right ASIS towards the left upper quadrant. The bladder was inspected and noted to be at/below the pubic symphysis.  Staying 3 fingerbreadths above the pubic symphysis, an incision was created and the 12 mm robotic trocar inserted directed cephalad into the peritoneal cavity under direct visualization.  An additional 5 mm  assist port was placed in the right  lateral abdomen under direct visualization.  The abdomen was surveyed and there was omental adhesions in the low midline. She was positioned in Trendelenburg with the left side tilted slightly up.  Small bowel was carefully retracted out of the pelvis.  The robot was then docked and I went to the console. ? ?We began with adhesiolysis. Adhesions consisting of omentum were carefully taken down from the abdominal wall. ?  ?The sigmoid colon was readily identified.  There are dense adhesions between the sigmoid and the peritoneum overlying her bladder and her vagina.  The colon is densely fibrotic at this location.  It appears that she has a loop extending down beyond this into her pelvis that then connects to her rectum.  We therefore opted to proceed with taking this down first.  Much of this dissection was carried out using sharp dissection with electrocautery.  Care was taken to stay in a plane along and even within the serosa of the sigmoid colon so as to avoid injury to the bladder.  We were laterally able to identify the left ureters with the assistance of ICG.  We were able to stay medial to the structures.  The sigmoid was then fully detached from the anterior compartment of the pelvis.  We entered an abscess cavity that was between her sigmoid and likely her vagina.  There is approximately 2 cc of pus/purulent fluid that was drained and evacuated with the suction irrigator. ? ?After successfully freeing the colon from the anterior pelvic compartment, the pelvic anatomy with regards to where her rectum lies was able to be delineated.  We were able to stay away from her rectum and avoid any injury to the structure.  Atachments of the sigmoid colon were taken down from the intersigmoid fossa.  The rectosigmoid colon was grasped and elevated anteriorly.  Beginning with a medial to lateral approach, the peritoneum overlying the presacral space was carefully incised.  The TME plane was readily gained working in a  plane between the fascia propria of the rectum and the presacral fascia.  Hypogastric nerves were seen going along the the presacral fascia and were protected free of injury.  Working more proximally, the mesorectum and sigmoid mesentery were carefully mobilized off of the peritoneum.  The left ureter was identified and protected free of injury.  The left gonadal vessels were identified and protected.  These were both swept "down."  The superior hemorrhoidal and IMA pedicles were identified. Further mesocolon was mobilized proximally staying in this plane between the retroperitoneum proper and the mesocolon. Attention was then turned to the lateral portion of dissection.  The sigmoid colon was then retracted to the right.  The sigmoid colon was fully mobilized. The descending colon was mobilized by incising the Tilak Oakley line of Toldt.  This was done all the way up to the level of the splenic flexure. ? ?The associated mesocolon was also mobilized medially.  The left ureter again was confirmed to be well away from the vasculature which had been dissected medially.  The rectosigmoid colon was elevated anteriorly. The left ureter was re-identified. The IMA was clear of this. The IMA was then divided with the vessel sealer. The stump was inspected and noted to be completely hemostatic with a good seal.  The mesentery was divided out to the point of planned proximal division. ? ?Working more distally, the rectum was identified where the tinea had splayed and there were loss of appendices epiploica.  This also corresponded to a location overlying the sacral promontory.  Anatomically, this clearly represents the proximal rectum.  The mesentery out to this level was then cleared using the vessel sealer. The distal point of transection on the proximal rectum was identified.   A 60 mm green load robotic stapler was then placed through the 12 mm port and introduced into the peritoneal cavity.  The rectum was divided with 2  firings of the stapler, keeping staple lines flush with one another.  The stump was intact and healthy in appearance. ?  ?Attention was turned to performing a perfusion test. ICG was administered by anesthesia a

## 2021-11-04 NOTE — Transfer of Care (Signed)
Immediate Anesthesia Transfer of Care Note ? ?Patient: Concetta Guion ? ?Procedure(s) Performed: XI ROBOTIC ASSISTED LOWER ANTERIOR RESECTION, RESECTION OF COLOVAGINAL FISTULA, BILATERAL TAP BLOCK, INJECTION OF FIREFLY ?FLEXIBLE SIGMOIDOSCOPY ?CYSTOSCOPY with FIREFLY INJECTION ? ?Patient Location: PACU ? ?Anesthesia Type:General ? ?Level of Consciousness: awake ? ?Airway & Oxygen Therapy: Patient Spontanous Breathing and Patient connected to face mask oxygen ? ?Post-op Assessment: Report given to RN and Post -op Vital signs reviewed and stable ? ?Post vital signs: Reviewed and stable ? ?Last Vitals:  ?Vitals Value Taken Time  ?BP 141/89 11/04/21 1245  ?Temp    ?Pulse 112 11/04/21 1245  ?Resp 16 11/04/21 1245  ?SpO2 100 % 11/04/21 1245  ?Vitals shown include unvalidated device data. ? ?Last Pain:  ?Vitals:  ? 11/04/21 0645  ?TempSrc: Oral  ?   ? ?  ? ?Complications: No notable events documented. ?

## 2021-11-04 NOTE — Anesthesia Procedure Notes (Signed)
Procedure Name: Intubation ?Date/Time: 11/04/2021 8:41 AM ?Performed by: Sharlette Dense, CRNA ?Pre-anesthesia Checklist: Patient identified, Emergency Drugs available, Suction available and Patient being monitored ?Patient Re-evaluated:Patient Re-evaluated prior to induction ?Oxygen Delivery Method: Circle system utilized ?Preoxygenation: Pre-oxygenation with 100% oxygen ?Induction Type: IV induction ?Ventilation: Mask ventilation without difficulty and Oral airway inserted - appropriate to patient size ?Laryngoscope Size: Sabra Heck and 2 ?Grade View: Grade II ?Tube type: Oral ?Tube size: 7.5 mm ?Number of attempts: 1 ?Airway Equipment and Method: Stylet ?Placement Confirmation: ETT inserted through vocal cords under direct vision, positive ETCO2 and breath sounds checked- equal and bilateral ?Secured at: 21 cm ?Tube secured with: Tape ?Dental Injury: Teeth and Oropharynx as per pre-operative assessment  ? ? ? ? ?

## 2021-11-04 NOTE — Op Note (Signed)
Operative Note ? ?Preoperative diagnosis:  ?1.  Colovaginal fistula ?Post operative diagnosis: ?1.  Colovaginal fistula ? ?Procedure(s): ?1.  Cystoscopy with bilateral retrograde instillation of firefly and bilateral open-ended ureteral catheter placement ? ?Surgeon: Link Snuffer, MD ? ?Assistants: None ? ?Anesthesia: General ? ?Complications: None immediate ? ?EBL: Minimal ? ?Specimens: ?1.  None ? ?Drains/Catheters: ?1.  Bilateral open-ended ureteral catheters ?2.  Foley catheter ? ?Intraoperative findings: 1.  Normal urethra and bladder mucosa with some edema but no obvious tumor ? ?Indication: 51 year old female with a history of colovaginal fistula presents for takedown.  Intraoperative stent placement was requested. ? ?Description of procedure: ? ?The patient was identified and consent was obtained.  The patient was taken to the operating room and placed in the supine position.  The patient was placed under general anesthesia.  Perioperative antibiotics were administered.  The patient was placed in dorsal lithotomy.  Patient was prepped and draped in a standard sterile fashion and a timeout was performed. ? ?A 21 French rigid cystoscope was advanced into the urethra and into the bladder.  The left distal most portion of the ureter was cannulated with an open-ended ureteral catheter.  Retrograde instillation of firefly was performed.  A sensor wire was then advanced up to the kidney.  The open-ended ureteral catheter was advanced over the wire up to the kidney and then the scope and wire were withdrawn keeping the open-ended ureteral catheter in place.  ? ?The right distal most portion of the ureter was cannulated with an open-ended ureteral catheter.  Retrograde instillation of firefly was again performed.  A sensor wire was then advanced up to the kidney.  The open-ended ureteral catheter was advanced over the wire up to the kidney and then the scope and wire were withdrawn keeping the open-ended ureteral  catheter in place.Foley catheter was placed and both open-ended ureteral catheters were secured and threaded through into a drainage bag.  This concluded my portion of the operation.  Patient tolerated procedure well and the case was handed over to general surgery. ? ?Plan: Per general surgery.  Urology will be available as needed. ? ? ? ?

## 2021-11-05 ENCOUNTER — Encounter (HOSPITAL_COMMUNITY): Payer: Self-pay | Admitting: Surgery

## 2021-11-05 ENCOUNTER — Other Ambulatory Visit (HOSPITAL_COMMUNITY): Payer: Self-pay

## 2021-11-05 DIAGNOSIS — E119 Type 2 diabetes mellitus without complications: Secondary | ICD-10-CM

## 2021-11-05 DIAGNOSIS — F17200 Nicotine dependence, unspecified, uncomplicated: Secondary | ICD-10-CM | POA: Diagnosis not present

## 2021-11-05 LAB — CBC
HCT: 29.2 % — ABNORMAL LOW (ref 36.0–46.0)
Hemoglobin: 9.3 g/dL — ABNORMAL LOW (ref 12.0–15.0)
MCH: 21.7 pg — ABNORMAL LOW (ref 26.0–34.0)
MCHC: 31.8 g/dL (ref 30.0–36.0)
MCV: 68.2 fL — ABNORMAL LOW (ref 80.0–100.0)
Platelets: 211 10*3/uL (ref 150–400)
RBC: 4.28 MIL/uL (ref 3.87–5.11)
RDW: 14.4 % (ref 11.5–15.5)
WBC: 12.4 10*3/uL — ABNORMAL HIGH (ref 4.0–10.5)
nRBC: 0 % (ref 0.0–0.2)

## 2021-11-05 LAB — LIPID PANEL
Cholesterol: 136 mg/dL (ref 0–200)
HDL: 31 mg/dL — ABNORMAL LOW (ref >40)
LDL Cholesterol: 71 mg/dL (ref 0–99)
Total CHOL/HDL Ratio: 4.4 RATIO
Triglycerides: 168 mg/dL — ABNORMAL HIGH (ref <150)
VLDL: 34 mg/dL (ref 0–40)

## 2021-11-05 LAB — BASIC METABOLIC PANEL
Anion gap: 7 (ref 5–15)
BUN: 13 mg/dL (ref 6–20)
CO2: 22 mmol/L (ref 22–32)
Calcium: 8.3 mg/dL — ABNORMAL LOW (ref 8.9–10.3)
Chloride: 104 mmol/L (ref 98–111)
Creatinine, Ser: 0.8 mg/dL (ref 0.44–1.00)
GFR, Estimated: >60 mL/min (ref >60)
Glucose, Bld: 138 mg/dL — ABNORMAL HIGH (ref 70–99)
Potassium: 3.8 mmol/L (ref 3.5–5.1)
Sodium: 133 mmol/L — ABNORMAL LOW (ref 135–145)

## 2021-11-05 LAB — GLUCOSE, CAPILLARY
Glucose-Capillary: 133 mg/dL — ABNORMAL HIGH (ref 70–99)
Glucose-Capillary: 125 mg/dL — ABNORMAL HIGH (ref 70–99)
Glucose-Capillary: 119 mg/dL — ABNORMAL HIGH (ref 70–99)
Glucose-Capillary: 179 mg/dL — ABNORMAL HIGH (ref 70–99)

## 2021-11-05 MED ORDER — NICOTINE POLACRILEX 2 MG MT GUM
2.0000 mg | CHEWING_GUM | OROMUCOSAL | Status: DC | PRN
  Filled 2021-11-05: qty 1

## 2021-11-05 MED ORDER — ASPIRIN EC 81 MG PO TBEC
81.0000 mg | DELAYED_RELEASE_TABLET | Freq: Every day | ORAL | Status: DC
  Administered 2021-11-06: 81 mg via ORAL
  Filled 2021-11-05: qty 1

## 2021-11-05 MED ORDER — TRAMADOL HCL 50 MG PO TABS
50.0000 mg | ORAL_TABLET | Freq: Four times a day (QID) | ORAL | 0 refills | Status: AC | PRN
  Filled 2021-11-05: qty 15, 4d supply, fill #0

## 2021-11-05 MED ORDER — METFORMIN HCL ER 500 MG PO TB24
500.0000 mg | ORAL_TABLET | Freq: Every day | ORAL | 0 refills | Status: DC
  Filled 2021-11-05: qty 30, 30d supply, fill #0

## 2021-11-05 MED ORDER — LIVING WELL WITH DIABETES BOOK
Freq: Once | Status: AC
  Filled 2021-11-05: qty 1

## 2021-11-05 MED ORDER — METFORMIN HCL ER 500 MG PO TB24
500.0000 mg | ORAL_TABLET | Freq: Every day | ORAL | Status: DC
  Administered 2021-11-05: 500 mg via ORAL
  Filled 2021-11-05: qty 1

## 2021-11-05 NOTE — Progress Notes (Signed)
?Subjective ?No acute events. Doing great. No complaints. Pain well controlled. Ambulating, voiding spontaneously now that foley out, having flatus and BM. Tolerating full liquids without trouble. In good spirits ? ?Objective: ?Vital signs in last 24 hours: ?Temp:  [97.9 ?F (36.6 ?C)-99 ?F (37.2 ?C)] 98.7 ?F (37.1 ?C) (03/16 0435) ?Pulse Rate:  [81-112] 88 (03/16 0435) ?Resp:  [11-19] 16 (03/16 0435) ?BP: (99-141)/(64-90) 114/76 (03/16 0435) ?SpO2:  [94 %-99 %] 99 % (03/16 0435) ?Weight:  [97.5 kg-97.6 kg] 97.5 kg (03/16 0600) ?Last BM Date : 11/03/21 ? ?Intake/Output from previous day: ?03/15 0701 - 03/16 0700 ?In: 68 [P.O.:600; I.V.:1900; IV Piggyback:100] ?Out: 1155 [Urine:1030; Blood:125] ?Intake/Output this shift: ?No intake/output data recorded. ? ?Gen: NAD, comfortable ?CV: RRR ?Pulm: Normal work of breathing ?Abd: Soft, minimal tenderness around incisions. C/d/I without erythema or drainage ?Ext: SCDs in place ? ?Lab Results: ?CBC  ?Recent Labs  ?  11/04/21 ?1525 11/05/21 ?0424  ?WBC 14.0* 12.4*  ?HGB 12.1 9.3*  ?HCT 37.9 29.2*  ?PLT 261 211  ? ?BMET ?Recent Labs  ?  11/04/21 ?1525 11/05/21 ?0424  ?NA  --  133*  ?K  --  3.8  ?CL  --  104  ?CO2  --  22  ?GLUCOSE  --  138*  ?BUN  --  13  ?CREATININE 1.02* 0.80  ?CALCIUM  --  8.3*  ? ?PT/INR ?No results for input(s): LABPROT, INR in the last 72 hours. ?ABG ?No results for input(s): PHART, HCO3 in the last 72 hours. ? ?Invalid input(s): PCO2, PO2 ? ?Studies/Results: ? ?Anti-infectives: ?Anti-infectives (From admission, onward)  ? ? Start     Dose/Rate Route Frequency Ordered Stop  ? 11/05/21 1000  terbinafine (LAMISIL) tablet 250 mg       ? 250 mg Oral Daily 11/04/21 1404    ? 11/04/21 1400  neomycin (MYCIFRADIN) tablet 1,000 mg  Status:  Discontinued       ?See Hyperspace for full Linked Orders Report.  ? 1,000 mg Oral 3 times per day 11/04/21 0611 11/04/21 0614  ? 11/04/21 1400  metroNIDAZOLE (FLAGYL) tablet 1,000 mg  Status:  Discontinued       ?See  Hyperspace for full Linked Orders Report.  ? 1,000 mg Oral 3 times per day 11/04/21 0611 11/04/21 0614  ? 11/04/21 0615  cefoTEtan (CEFOTAN) 2 g in sodium chloride 0.9 % 100 mL IVPB       ? 2 g ?200 mL/hr over 30 Minutes Intravenous On call to O.R. 11/04/21 5573 11/04/21 0859  ? ?  ? ? ? ?Assessment/Plan: ?Patient Active Problem List  ? Diagnosis Date Noted  ? S/P laparoscopic-assisted sigmoidectomy 11/04/2021  ? Abnormal CT scan 08/05/2021  ? Hx of diverticulitis of colon 08/05/2021  ? Colovaginal fistula 07/03/2021  ? Acquired palmar and plantar hyperkeratosis 01/25/2021  ? Hyperglycemia 02/01/2020  ? Crohn's disease of large intestine with abscess ([place]) 01/31/2020  ? Acute diverticulitis 01/09/2020  ? Diverticulitis of large intestine with abscess without bleeding 06/07/2017  ? Abscess of sigmoid colon due to diverticulitis 05/24/2017  ? Tobacco dependence 05/11/2017  ? Essential hypertension 05/11/2017  ? Reflux esophagitis 05/11/2017  ? Tinea pedis of left foot 04/11/2015  ? C. difficile colitis 08/26/2011  ? Enteritis 08/25/2011  ? ?s/p Procedure(s): ?XI ROBOTIC ASSISTED LOWER ANTERIOR RESECTION, RESECTION OF COLOVAGINAL FISTULA, BILATERAL TAP BLOCK, INJECTION OF FIREFLY ?FLEXIBLE SIGMOIDOSCOPY ?CYSTOSCOPY with FIREFLY INJECTION 11/04/2021 ? ?-We spent time both last night and again today reviewing her procedure, findings, and plans.  All questions answered ?-Adv to carb modified diet ?-D/C IVF ?-Ambulate 5x/day ?-Hospitalist consult for new dx of diabetes; a1c 6.6 preop. Diabetes coordinator consult also placed ?-Ppx: SQH, SCDs ?-Dispo: possible discharge tomorrow if continues to do well and all is in order from hyperglycemia standpoint ? ? LOS: 1 day  ? ?Nadeen Landau, MD FACS ?[facility] ? ?

## 2021-11-05 NOTE — Discharge Instructions (Addendum)
POST OP INSTRUCTIONS AFTER COLON SURGERY ? ?DIET: Be sure to include lots of fluids daily to stay hydrated - 64oz of water per day (8, 8 oz glasses).  Avoid fast food or heavy meals for the first couple of weeks as your are more likely to get nauseated. Avoid raw/uncooked fruits or vegetables for the first 4 weeks (its ok to have these if they are blended into smoothie form). If you have fruits/vegetables, make sure they are cooked until soft enough to mash on the roof of your mouth and chew your food well. Otherwise, diet as tolerated. ? ?Take your usually prescribed home medications unless otherwise directed. ? ?PAIN CONTROL: ?Pain is best controlled by a usual combination of three different methods TOGETHER: ?Ice/Heat ?Over the counter pain medication ?Prescription pain medication ?Most patients will experience some swelling and bruising around the surgical site.  Ice packs or heating pads (30-60 minutes up to 6 times a day) will help. Some people prefer to use ice alone, heat alone, alternating between ice & heat.  Experiment to what works for you.  Swelling and bruising can take several weeks to resolve.   ?It is helpful to take an over-the-counter pain medication regularly for the first few weeks: ?Ibuprofen (Motrin/Advil) - 262m tabs - take 3 tabs (6027m every 6 hours as needed for pain (unless you have been directed previously to avoid NSAIDs/ibuprofen) ?Acetaminophen (Tylenol) - you may take 65036mvery 6 hours as needed. You can take this with motrin as they act differently on the body. If you are taking a narcotic pain medication that has acetaminophen in it, do not take over the counter tylenol at the same time. ?NOTE: You may take both of these medications together - most patients find it most helpful when alternating between the two (i.e. Ibuprofen at 6am, tylenol at 9am, ibuprofen at 12pm ...) ?A  prescription for pain medication should be given to you upon discharge.  Take your pain medication as  prescribed if your pain is not adequatly controlled with the over-the-counter pain reliefs mentioned above. ? ?Avoid getting constipated.  Between the surgery and the pain medications, it is common to experience some constipation.  Increasing fluid intake and taking a fiber supplement (such as Metamucil, Citrucel, FiberCon, MiraLax, etc) 1-2 times a day regularly will usually help prevent this problem from occurring.  A mild laxative (prune juice, Milk of Magnesia, MiraLax, etc) should be taken according to package directions if there are no bowel movements after 48 hours.   ? ?Dressing: Your incisions are covered in Dermabond which is like sterile superglue for the skin. This will come off on it's own in a couple weeks. It is waterproof and you may bathe normally starting the day after your surgery in a shower. Avoid baths/pools/lakes/oceans until your wounds have fully healed. ?New diagnosis of diabetes - metformin has been prescribed as per Amanda Garner have provided a 90 day supple. Maintain dietary recommendations as reviewed by him and our dietician. You should follow-up with your primary care physician in the next 30 days and provide update on health  as well as new medications and your diagnosis of type 2 diabetes. ? ?ACTIVITIES as tolerated:   ?Avoid heavy lifting (>10lbs or 1 gallon of milk) for the next 6 weeks. ?You may resume regular daily activities as tolerated--such as daily self-care, walking, climbing stairs--gradually increasing activities as tolerated.  If you can walk 30 minutes without difficulty, it is safe to try more intense activity such  as jogging, treadmill, bicycling, low-impact aerobics.  ?DO NOT PUSH THROUGH PAIN.  Let pain be your guide: If it hurts to do something, don't do it. ?You may drive when you are no longer taking prescription pain medication, you can comfortably wear a seatbelt, and you can safely maneuver your car and apply brakes. ? ?FOLLOW UP in our office ?Please call  [facility] at [phone] to set up an appointment to see your surgeon in the office for a follow-up appointment approximately 2 weeks after your surgery. ?Make sure that you call for this appointment the day you arrive home to insure a convenient appointment time. ? ?9. If you have disability or family leave forms that need to be completed, you may have them completed by your primary care physician's office; for return to work instructions, please ask our office staff and they will be happy to assist you in obtaining this documentation ?  ?When to call us [phone]: ?Poor pain control ?Reactions / problems with new medications (rash/itching, etc)  ?Fever over 101.5 F (38.5 C) ?Inability to urinate ?Nausea/vomiting ?Worsening swelling or bruising ?Continued bleeding from incision. ?Increased pain, redness, or drainage from the incision ? ?The clinic staff is available to answer your questions during regular business hours (8:30am-5pm).  Please don?t hesitate to call and ask to speak to one of our nurses for clinical concerns.   A surgeon from [facility] is always on call at the hospitals ?  ?If you have a medical emergency, go to the nearest emergency room or call 911. ? ?[facility], [place] ?[address] [place], [place], [place]  [address] ?MAIN: [phone] ?FAX: [phone] ?[email or web address] ? ? ?--------------------------------------------------------- ? ?Amanda Garner, ? ?While in the hospital, you have been started on treatment for your diabetes. I have recommended for you to take metformin daily. Your primary care physician may increase this to twice daily if you tolerate the medication well. I have also ordered a kit for you to check your blood sugar [Glucose meter, lancets (to poke your skin), test strips (to collect your blood for the glucose meter to read)]. Diet modification may play a big part in your diabetes care, in addition to weight loss.  These issues can be managed as an outpatient. Please call your doctor if you notice increased thirst or urination. ?

## 2021-11-05 NOTE — Consult Note (Addendum)
Initial Consultation Note ? ? ?Patient: Amanda Garner SWH:[id number] DOB: [birth year] PCP: No primary care provider on file. ?DOA: 11/04/2021 ?DOS: the patient was seen and examined on 11/05/2021 ?Primary service: Ileana Roup, MD ? ?Referring physician: Dr. Dema Severin ?Reason for consult: New DM diagnosis ? ?Assessment/Plan: ?Assessment and Plan: ?No notes have been filed under this hospital service. ?Service: Hospitalist ?DM2 ?    - new diagnosis ?    - DM coordinator consult ?    - A1c is 6.6% on 10/28/21 ?    - long discussion about pathology, treatment/control, pitfalls, and prognosis for diabetes; she is familiar with some of the subject as her mom had diabetes ?    - currently on SSI in house, she has received 6 units novolog over the last 24 hours; will start metformin and can go home on that alone with good diet control; she will need outpt follow up ?    - start ASA 1m qday ?    - check lipid panel; may need statin as well ? ?Tobacco abuse ?    - counseled on need to quit smoking. Add nicotine gum while inpt ? ?HTN ?    - continue home regimen ? ?GERD ?    - continue home regimen ? ?Colovaginal fistula ?    - now s/p repair with primary team ? ?Remainder per primary team. ? ?[facility] will continue to follow the patient. ? ?HPI: Amanda Messmeris a 51y.o. female with past medical history of HTN, GERD. Presenting with colovaginal fistula. She apparently has had multiple bouts of diverticulitis and has been following with general surgery for several months. The patient and surgery elected to proceed with repair and she completed that procedure without incident. During her pre-admission testing, her A1c was noted to be 6.6. She reports that she has followed periodically with an outpt physician who previously told her she was pre-diabetic w/ an A1c of 5.8. She has not been on any medication. She had been counseled about diet and exercise as related to DM in the past. [facility] was consulted for a new diagnosis of DM2.   ? ?Review of Systems: As mentioned in the history of present illness. All other systems reviewed and are negative. ?Past Medical History:  ?Diagnosis Date  ? Acid reflux   ? Blood transfusion   ? Diverticulitis   ? Hypertension   ? Sickle cell trait ([place]   ? ?Past Surgical History:  ?Procedure Laterality Date  ? ABDOMINAL HYSTERECTOMY  2001  ? partial hysterectomy - emergency during last C-section  ? [place] 1997; 2001  ? CESAREAN SECTION    ? FLEXIBLE SIGMOIDOSCOPY N/A 11/04/2021  ? Procedure: FLEXIBLE SIGMOIDOSCOPY;  Surgeon: WIleana Roup MD;  Location: [facility];  Service: General;  Laterality: N/A;  ? XI ROBOTIC ASSISTED LOWER ANTERIOR RESECTION N/A 11/04/2021  ? Procedure: XI ROBOTIC ASSISTED LOWER ANTERIOR RESECTION, RESECTION OF COLOVAGINAL FISTULA, BILATERAL TAP BLOCK, INJECTION OF FIREFLY;  Surgeon: WIleana Roup MD;  Location: [facility];  Service: General;  Laterality: N/A;  ? ?Social History:  reports that she has been smoking cigarettes. She has a 15.50 pack-year smoking history. She has never used smokeless tobacco. She reports current alcohol use of about 1.0 standard drink per week. She reports current drug use. Frequency: 7.00 times per week. Drug: Marijuana. ? ?No Known Allergies ? ?Family History  ?Problem Relation Age of Onset  ? Hypertension Mother   ? Diabetes Mother   ?  Diabetes type II Mother   ? Diabetes type II Father   ? Stroke Father   ? Lung cancer Father 72  ? Esophageal cancer Maternal Grandfather   ? Colon cancer Neg Hx   ? Pancreatic cancer Neg Hx   ? Stomach cancer Neg Hx   ? ? ?Prior to Admission medications   ?Medication Sig Start Date End Date Taking? Authorizing Provider  ?amLODipine (NORVASC) 10 MG tablet Take 1 tablet (10 mg total) by mouth daily. 08/12/21 08/12/22 Yes Charlott Rakes, MD  ?pantoprazole (PROTONIX) 40 MG tablet Take 1 tablet (40 mg total) by mouth daily. 08/12/21 10/23/23 Yes Charlott Rakes, MD  ?terbinafine (LAMISIL) 250 MG tablet Take  1 tablet (250 mg total) by mouth daily. 08/19/21  Yes Wallene Huh, DPM  ? ? ?Physical Exam: ?Vitals:  ? 11/04/21 2151 11/05/21 0202 11/05/21 0435 11/05/21 0600  ?BP: 116/82 128/85 114/76   ?Pulse: 88 81 88   ?Resp: 18 18 16    ?Temp: 98.7 ?F (37.1 ?C) 98.6 ?F (37 ?C) 98.7 ?F (37.1 ?C)   ?TempSrc: Oral Oral Oral   ?SpO2: 98% 98% 99%   ?Weight:   97.6 kg 97.5 kg  ?Height:      ? ?General: 51 y.o. female resting in bed in NAD ?Eyes: PERRL, normal sclera ?ENMT: Nares patent w/o discharge, orophaynx clear, dentition normal, ears w/o discharge/lesions/ulcers ?Neck: Supple, trachea midline ?Cardiovascular: RRR, +S1, S2, no m/g/r, equal pulses throughout ?Respiratory: CTABL, no w/r/r, normal WOB ?GI: BS+, NDNT, no masses noted, no organomegaly noted ?MSK: No e/c/c ?Neuro: A&O x 3, no focal deficits ?Psyc: Appropriate interaction and affect, calm/cooperative ? ?Data Reviewed:  ? ?A1c  6.6 ?Glucose  133 ? ? ?Family Communication: None at bedside ?Thank you very much for involving [place] in the care of your patient. ? ?Author: ?Jonnie Finner, DO ?11/05/2021 8:44 AM ? ?60 minutes spent in coordination of care including counseling on DM and tobacco usage. ? ?For on call review [email or web address].  ?

## 2021-11-05 NOTE — Progress Notes (Signed)
Received TOC referral to assist with PCP needs.  Met with pt who reports she is followed at [facility].  Updated care team info.  Pt denies any further needs. ? ?Amanda Raspberry, LCSW  ?

## 2021-11-05 NOTE — Progress Notes (Signed)
Inpatient Diabetes Program Recommendations ? ?AACE/ADA: New Consensus Statement on Inpatient Glycemic Control (2015) ? ?Target Ranges:  Prepandial:   less than 140 mg/dL ?     Peak postprandial:   less than 180 mg/dL (1-2 hours) ?     Critically ill patients:  140 - 180 mg/dL  ? ?Lab Results  ?Component Value Date  ? GLUCAP 125 (H) 11/05/2021  ? HGBA1C 6.6 (H) 10/28/2021  ? ? ?Review of Glycemic Control ? ?Diabetes history: New-onset DM ?Outpatient Diabetes medications: None ?Current orders for Inpatient glycemic control: Novolog 0-9 units TID with meals and 0-5 HS, metformin 500 mg QAM ? ?HgbA1C - 6.6% ? ?Inpatient Diabetes Program Recommendations:   ? ?Spoke with pt at bedside regarding new diagnosis of diabetes - type 2. Discussed HgbA1C of 6.6% with average blood sugar of 143 mg/dL. Discussed basic pathophysiology of DM Type 2, basic home care, importance of checking CBGs and maintaining good CBG control to prevent long-term and short-term complications. Reviewed glucose and A1C goals and reviewed signs and symptoms of hyperglycemia and hypoglycemia along with treatment for both. Discussed impact of nutrition, exercise, stress, sickness, and medications on diabetes control. Reviewed Living Well with diabetes booklet and encouraged patient to read through entire book. Pt asked good questions. Seems very motivated to make changes with diet and lifestyle. Instructed to f/u with PCP. ? ?Discussed above with RN. ? ?Thank you. ?Lorenda Peck, RD, LDN, CDE ?Inpatient Diabetes Coordinator ?[phone]  ? ? ? ? ?

## 2021-11-06 ENCOUNTER — Other Ambulatory Visit (HOSPITAL_COMMUNITY): Payer: Self-pay

## 2021-11-06 DIAGNOSIS — Z9049 Acquired absence of other specified parts of digestive tract: Secondary | ICD-10-CM | POA: Diagnosis not present

## 2021-11-06 LAB — CBC
HCT: 31.3 % — ABNORMAL LOW (ref 36.0–46.0)
Hemoglobin: 10 g/dL — ABNORMAL LOW (ref 12.0–15.0)
MCH: 21.8 pg — ABNORMAL LOW (ref 26.0–34.0)
MCHC: 31.9 g/dL (ref 30.0–36.0)
MCV: 68.3 fL — ABNORMAL LOW (ref 80.0–100.0)
Platelets: 225 10*3/uL (ref 150–400)
RBC: 4.58 MIL/uL (ref 3.87–5.11)
RDW: 14.5 % (ref 11.5–15.5)
WBC: 10.3 10*3/uL (ref 4.0–10.5)
nRBC: 0 % (ref 0.0–0.2)

## 2021-11-06 LAB — BASIC METABOLIC PANEL
Anion gap: 7 (ref 5–15)
BUN: 11 mg/dL (ref 6–20)
CO2: 25 mmol/L (ref 22–32)
Calcium: 8.7 mg/dL — ABNORMAL LOW (ref 8.9–10.3)
Chloride: 106 mmol/L (ref 98–111)
Creatinine, Ser: 0.67 mg/dL (ref 0.44–1.00)
GFR, Estimated: >60 mL/min (ref >60)
Glucose, Bld: 146 mg/dL — ABNORMAL HIGH (ref 70–99)
Potassium: 3.4 mmol/L — ABNORMAL LOW (ref 3.5–5.1)
Sodium: 138 mmol/L (ref 135–145)

## 2021-11-06 LAB — GLUCOSE, CAPILLARY
Glucose-Capillary: 136 mg/dL — ABNORMAL HIGH (ref 70–99)
Glucose-Capillary: 150 mg/dL — ABNORMAL HIGH (ref 70–99)

## 2021-11-06 LAB — SURGICAL PATHOLOGY

## 2021-11-06 MED ORDER — POTASSIUM CHLORIDE CRYS ER 20 MEQ PO TBCR
40.0000 meq | EXTENDED_RELEASE_TABLET | Freq: Once | ORAL | Status: AC
  Administered 2021-11-06: 40 meq via ORAL

## 2021-11-06 MED ORDER — BLOOD GLUCOSE MONITOR SYSTEM W/DEVICE KIT
PACK | 0 refills | Status: AC
Start: 2021-11-06 — End: ?
  Filled 2021-11-06: qty 1, 90d supply, fill #0

## 2021-11-06 MED ORDER — GLUCOSE BLOOD VI STRP
ORAL_STRIP | 0 refills | Status: AC
  Filled 2021-11-06: qty 100, 90d supply, fill #0

## 2021-11-06 MED ORDER — POTASSIUM CHLORIDE CRYS ER 20 MEQ PO TBCR
40.0000 meq | EXTENDED_RELEASE_TABLET | Freq: Once | ORAL | Status: AC
  Administered 2021-11-06: 40 meq via ORAL
  Filled 2021-11-06: qty 2

## 2021-11-06 MED ORDER — METFORMIN HCL 500 MG PO TABS
500.0000 mg | ORAL_TABLET | Freq: Every day | ORAL | Status: DC
  Administered 2021-11-06: 500 mg via ORAL
  Filled 2021-11-06: qty 1

## 2021-11-06 MED ORDER — METFORMIN HCL 500 MG PO TABS
500.0000 mg | ORAL_TABLET | Freq: Every day | ORAL | 0 refills | Status: DC
  Filled 2021-11-06: qty 30, 30d supply, fill #0

## 2021-11-06 MED ORDER — ACCU-CHEK FASTCLIX LANCETS MISC
0 refills | Status: DC
  Filled 2021-11-06: qty 102, 30d supply, fill #0

## 2021-11-06 NOTE — Assessment & Plan Note (Signed)
Continue home Protonix on discharge. ?

## 2021-11-06 NOTE — Progress Notes (Signed)
Nutrition Note ? ?RD consulted for nutrition education regarding diabetes.  ? ?Lab Results  ?Component Value Date  ? HGBA1C 6.6 (H) 10/28/2021  ? ? ?RD provided "Carbohydrate Counting for People with Diabetes" handout from the Academy of Nutrition and Dietetics. Discussed different food groups and their effects on blood sugar, emphasizing carbohydrate-containing foods. Provided list of carbohydrates and recommended serving sizes of common foods. ? ?Discussed importance of controlled and consistent carbohydrate intake throughout the day. Provided examples of ways to balance meals/snacks and encouraged intake of high-fiber, whole grain complex carbohydrates. Teach back method used. ? ?Expect good compliance. Pt motivated to change. Reports drinking a lot of sodas, lemonades, we spoke about unsweetened beverage options. ? ?Body mass index is 34.7 kg/m?Marland Kitchen Pt meets criteria for obesity based on current BMI. ? ?Current diet order is CHO modified, patient is consuming approximately 50% of meals at this time. Labs and medications reviewed. No further nutrition interventions warranted at this time. If additional nutrition issues arise, please re-consult RD. ? ?Amanda Bibles, MS, RD, LDN ?Inpatient Clinical Dietitian ?Contact information available via Amion ? ? ?

## 2021-11-06 NOTE — Assessment & Plan Note (Addendum)
Continue home amlodipine on discharge. ?

## 2021-11-06 NOTE — Progress Notes (Signed)
? ?PROGRESS NOTE ? ? ? ?Amanda Garner  UJW:[id number] DOB: 1971-02-22 DOA: 11/04/2021 ?PCP: Charlott Rakes, MD ? ? ?Brief Narrative: ?Amanda Garner is a 51 y.o. female with a history of pre-diabetes, hypertension and GERD. Patient presented secondary to colovaginal fistula and underwent takedown of colovaginal fistula, drainage of abscess between colon and vagina, resection of colorectal anastomosis. General medicine consulted for new diagnosis of diabetes with patient's hemoglobin A1C of 6.6%. ? ? ?Assessment and Plan: ?* S/P laparoscopic-assisted sigmoidectomy ?Per primary ? ?DM2 (diabetes mellitus, type 2) ([place]) ?Hemoglobin A1C of 6.6%. Patient has been started metformin as an inpatient and can continue on discharge; will adjust to metformin 500 mg immediate release daily. PCP can titrate to BID if patient tolerates metformin. Continue diet modification for redued carbohydrate intake. Diabetic supplies ordered for glucose monitoring on discharge. ? ?GERD (gastroesophageal reflux disease) ?Continue home Protonix on discharge. ? ?Essential hypertension ?Continue home amlodipine on discharge. ? ?Tobacco dependence ?Counseled on cessation this admission. ? ?Tinea pedis of left foot ?Continue home terbinafine on discharge. ? ? ? ? ?DVT prophylaxis: Per primary ?Code Status:   Code Status: Full Code ?Family Communication: None at bedside ?Disposition Plan: Per primary. Clear for discharge from a medicine standpoint. ? ? ? ?Subjective: ?No issues this morning. ? ?Objective: ?BP 124/82 (BP Location: Right Arm)   Pulse 83   Temp 98.3 ?F (36.8 ?C) (Oral)   Resp 18   Ht 5' 6"  (1.676 m)   Wt 97.5 kg   LMP 08/20/2011   SpO2 96%   BMI 34.70 kg/m?  ? ?Examination: ? ?General exam: Appears calm and comfortable ? ? ?Data Reviewed: I have personally reviewed following labs and imaging studies ? ?CBC ?Lab Results  ?Component Value Date  ? WBC 10.3 11/06/2021  ? RBC 4.58 11/06/2021  ? HGB 10.0 (L) 11/06/2021  ? HCT 31.3  (L) 11/06/2021  ? MCV 68.3 (L) 11/06/2021  ? MCH 21.8 (L) 11/06/2021  ? PLT 225 11/06/2021  ? MCHC 31.9 11/06/2021  ? RDW 14.5 11/06/2021  ? LYMPHSABS 2.7 10/28/2021  ? MONOABS 0.5 10/28/2021  ? EOSABS 0.2 10/28/2021  ? BASOSABS 0.1 10/28/2021  ? ? ? ?Last metabolic panel ?Lab Results  ?Component Value Date  ? NA 138 11/06/2021  ? K 3.4 (L) 11/06/2021  ? CL 106 11/06/2021  ? CO2 25 11/06/2021  ? BUN 11 11/06/2021  ? CREATININE 0.67 11/06/2021  ? GLUCOSE 146 (H) 11/06/2021  ? GFRNONAA >60 11/06/2021  ? GFRAA >60 04/07/2020  ? CALCIUM 8.7 (L) 11/06/2021  ? PHOS 3.3 07/04/2021  ? PROT 7.8 10/28/2021  ? ALBUMIN 3.9 10/28/2021  ? LABGLOB 2.8 01/31/2020  ? AGRATIO 1.6 01/31/2020  ? BILITOT 1.3 (H) 10/28/2021  ? ALKPHOS 74 10/28/2021  ? AST 25 10/28/2021  ? ALT 14 10/28/2021  ? ANIONGAP 7 11/06/2021  ? ? ?GFR: ?Estimated Creatinine Clearance: 98 mL/min (by C-G formula based on SCr of 0.67 mg/dL). ? ?Recent Results (from the past 240 hour(s))  ?Surgical PCR Screen     Status: None  ? Collection Time: 10/28/21 11:01 AM  ? Specimen: Nasal Mucosa; Nasal Swab  ?Result Value Ref Range Status  ? MRSA, PCR NEGATIVE NEGATIVE Final  ? Staphylococcus aureus NEGATIVE NEGATIVE Final  ?  Comment: (NOTE) ?The Xpert SA Assay (FDA approved for NASAL specimens in patients 27 ?years of age and older), is one component of a comprehensive ?surveillance program. It is not intended to diagnose infection nor to ?guide or monitor treatment. ?Performed  at [facility], [address] ?[place], [place] [address] ?  ?SARS CORONAVIRUS 2 (TAT 6-24 HRS) Nasopharyngeal Nasopharyngeal Swab     Status: None  ? Collection Time: 11/02/21 10:48 AM  ? Specimen: Nasopharyngeal Swab  ?Result Value Ref Range Status  ? SARS Coronavirus 2 NEGATIVE NEGATIVE Final  ?  Comment: (NOTE) ?SARS-CoV-2 target nucleic acids are NOT DETECTED. ? ?The SARS-CoV-2 RNA is generally detectable in upper and lower ?respiratory specimens during the acute phase of  infection. Negative ?results do not preclude SARS-CoV-2 infection, do not rule out ?co-infections with other pathogens, and should not be used as the ?sole basis for treatment or other patient management decisions. ?Negative results must be combined with clinical observations, ?patient history, and epidemiological information. The expected ?result is Negative. ? ?Fact Sheet for Patients: ?SugarRoll.be ? ?Fact Sheet for Healthcare Providers: ?https://www.woods-mathews.com/ ? ?This test is not yet approved or cleared by the [place] FDA and  ?has been authorized for detection and/or diagnosis of SARS-CoV-2 by ?FDA under an Emergency Use Authorization (EUA). This EUA will remain  ?in effect (meaning this test can be used) for the duration of the ?COVID-19 declaration under Se ction 564(b)(1) of the Act, 21 U.S.C. ?section 360bbb-3(b)(1), unless the authorization is terminated or ?revoked sooner. ? ?Performed at [facility], [place] [address] [place], [place] ?[address] ?  ?  ? ? ?Radiology Studies: ?No results found. ? ? ? LOS: 2 days  ? ? ?Cordelia Poche, MD ?[facility] ?11/06/2021, 8:15 AM ? ? ?If 7PM-7AM, please contact night-coverage ?[email or web address] ? ?

## 2021-11-06 NOTE — Progress Notes (Signed)
?Subjective ?No acute events. Doing great. No complaints. Pain well controlled. Ambulating, voiding spontaneously, having flatus and BM. Tolerating soft diet without trouble. In good spirits ? ?Objective: ?Vital signs in last 24 hours: ?Temp:  [98.3 ?F (36.8 ?C)-99 ?F (37.2 ?C)] 98.3 ?F (36.8 ?C) (03/17 0503) ?Pulse Rate:  [83-98] 83 (03/17 0503) ?Resp:  [18-19] 18 (03/17 0503) ?BP: (124-150)/(75-99) 124/82 (03/17 0503) ?SpO2:  [95 %-97 %] 96 % (03/17 0503) ?Last BM Date : 11/03/21 ? ?Intake/Output from previous day: ?03/16 0701 - 03/17 0700 ?In: 2180 [P.O.:1180; I.V.:1000] ?Out: 0  ?Intake/Output this shift: ?Total I/O ?In: 120 [P.O.:120] ?Out: -  ? ?Gen: NAD, comfortable ?CV: RRR ?Pulm: Normal work of breathing ?Abd: Soft, minimal tenderness around incisions. C/d/I without erythema or drainage ?Ext: SCDs in place ? ?Lab Results: ?CBC  ?Recent Labs  ?  11/05/21 ?0424 11/06/21 ?0409  ?WBC 12.4* 10.3  ?HGB 9.3* 10.0*  ?HCT 29.2* 31.3*  ?PLT 211 225  ? ?BMET ?Recent Labs  ?  11/05/21 ?0424 11/06/21 ?0409  ?NA 133* 138  ?K 3.8 3.4*  ?CL 104 106  ?CO2 22 25  ?GLUCOSE 138* 146*  ?BUN 13 11  ?CREATININE 0.80 0.67  ?CALCIUM 8.3* 8.7*  ? ?PT/INR ?No results for input(s): LABPROT, INR in the last 72 hours. ?ABG ?No results for input(s): PHART, HCO3 in the last 72 hours. ? ?Invalid input(s): PCO2, PO2 ? ?Studies/Results: ? ?Anti-infectives: ?Anti-infectives (From admission, onward)  ? ? Start     Dose/Rate Route Frequency Ordered Stop  ? 11/05/21 1000  terbinafine (LAMISIL) tablet 250 mg       ? 250 mg Oral Daily 11/04/21 1404    ? 11/04/21 1400  neomycin (MYCIFRADIN) tablet 1,000 mg  Status:  Discontinued       ?See Hyperspace for full Linked Orders Report.  ? 1,000 mg Oral 3 times per day 11/04/21 0611 11/04/21 0614  ? 11/04/21 1400  metroNIDAZOLE (FLAGYL) tablet 1,000 mg  Status:  Discontinued       ?See Hyperspace for full Linked Orders Report.  ? 1,000 mg Oral 3 times per day 11/04/21 0611 11/04/21 0614  ? 11/04/21 0615   cefoTEtan (CEFOTAN) 2 g in sodium chloride 0.9 % 100 mL IVPB       ? 2 g ?200 mL/hr over 30 Minutes Intravenous On call to O.R. 11/04/21 3976 11/04/21 0859  ? ?  ? ? ? ?Assessment/Plan: ?Patient Active Problem List  ? Diagnosis Date Noted  ? DM2 (diabetes mellitus, type 2) ([place]) 11/05/2021  ? GERD (gastroesophageal reflux disease) 11/05/2021  ? S/P laparoscopic-assisted sigmoidectomy 11/04/2021  ? Abnormal CT scan 08/05/2021  ? Hx of diverticulitis of colon 08/05/2021  ? Colovaginal fistula 07/03/2021  ? Acquired palmar and plantar hyperkeratosis 01/25/2021  ? Hyperglycemia 02/01/2020  ? Crohn's disease of large intestine with abscess ([place]) 01/31/2020  ? Acute diverticulitis 01/09/2020  ? Diverticulitis of large intestine with abscess without bleeding 06/07/2017  ? Abscess of sigmoid colon due to diverticulitis 05/24/2017  ? Tobacco dependence 05/11/2017  ? Essential hypertension 05/11/2017  ? Reflux esophagitis 05/11/2017  ? Tinea pedis of left foot 04/11/2015  ? C. difficile colitis 08/26/2011  ? Enteritis 08/25/2011  ? ?s/p Procedure(s): ?XI ROBOTIC ASSISTED LOWER ANTERIOR RESECTION, RESECTION OF COLOVAGINAL FISTULA, BILATERAL TAP BLOCK, INJECTION OF FIREFLY ?FLEXIBLE SIGMOIDOSCOPY ?CYSTOSCOPY with FIREFLY INJECTION 11/04/2021 ? ?-Ambulate 5x/day ?-On metformin now - appreciate Dr. Lisbeth Ply assistance - he communicated with her PCP the new diagnosis and she is following up with  her PCP for this issue. ?-Ppx: SQH, SCDs ?-Dispo: home today ? ? LOS: 2 days  ? ?Nadeen Landau, MD FACS ?[facility] ? ?

## 2021-11-06 NOTE — Assessment & Plan Note (Addendum)
Hemoglobin A1C of 6.6%. Patient has been started metformin as an inpatient and can continue on discharge; will adjust to metformin 500 mg immediate release daily. PCP can titrate to BID if patient tolerates metformin. Continue diet modification for redued carbohydrate intake. Diabetic supplies ordered for glucose monitoring on discharge. ?

## 2021-11-06 NOTE — Assessment & Plan Note (Signed)
Counseled on cessation this admission. ?

## 2021-11-06 NOTE — Hospital Course (Signed)
Amanda Garner is a 51 y.o. female with a history of pre-diabetes, hypertension and GERD. Patient presented secondary to colovaginal fistula and underwent takedown of colovaginal fistula, drainage of abscess between colon and vagina, resection of colorectal anastomosis. General medicine consulted for new diagnosis of diabetes with patient's hemoglobin A1C of 6.6%. ?

## 2021-11-06 NOTE — Assessment & Plan Note (Signed)
Per primary ?

## 2021-11-06 NOTE — Assessment & Plan Note (Signed)
Continue home terbinafine on discharge. ?

## 2021-11-06 NOTE — Discharge Summary (Signed)
Patient ID: ?Amanda Garner ?MRN: [id number] ?DOB/AGE: [birth year] 51 y.o. ? ?Admit date: 11/04/2021 ?Discharge date: 11/06/2021 ? ?Discharge Diagnoses ?Patient Active Problem List  ? Diagnosis Date Noted  ? DM2 (diabetes mellitus, type 2) ([place]) 11/05/2021  ? GERD (gastroesophageal reflux disease) 11/05/2021  ? S/P laparoscopic-assisted sigmoidectomy 11/04/2021  ? Abnormal CT scan 08/05/2021  ? Hx of diverticulitis of colon 08/05/2021  ? Colovaginal fistula 07/03/2021  ? Acquired palmar and plantar hyperkeratosis 01/25/2021  ? Hyperglycemia 02/01/2020  ? Crohn's disease of large intestine with abscess ([place]) 01/31/2020  ? Acute diverticulitis 01/09/2020  ? Diverticulitis of large intestine with abscess without bleeding 06/07/2017  ? Abscess of sigmoid colon due to diverticulitis 05/24/2017  ? Tobacco dependence 05/11/2017  ? Essential hypertension 05/11/2017  ? Reflux esophagitis 05/11/2017  ? Tinea pedis of left foot 04/11/2015  ? C. difficile colitis 08/26/2011  ? Enteritis 08/25/2011  ? ? ?Consultants ?Internal medicine - newly diagnosed T2DM. ? ?Procedures ?OR 11/04/21 -  ? ?Robotic assisted low anterior resection with double stapled colorectal anastomosis ?Robotic assisted drainage of abscess between colon and vagina ?Takedown of colovaginal fistula ?Intraoperative assessment of perfusion using ICG fluorescence imaging ?Flexible sigmoidoscopy ?Bilateral transversus abdominus plane (TAP) blocks ? ?Hospital Course: Admitted postoperatively where she recovered well. Medicine team consulted for newly diagnosed diabetes and started metformin daily, with plans for outpatient titration to BID at home. All diabetes teaching was coordinated with the medicine service and she met with dietary/diabetes coordinator. Her diet was advanced and she tolerated well. On POD#2 she continued to do well, passing gas and having BM. Pain well controlled with oral analgesics. She is comfortable with and stable for discharge home.  Follow-up/communication with pcp was made by Dr. Lonny Prude regarding DM. This has all been reviewed with her as well. ? ? ? ?Allergies as of 11/06/2021   ?No Known Allergies ?  ? ?  ?Medication List  ?  ? ?TAKE these medications   ? ?Accu-Chek FastClix Lancets Misc ?Use as directed to check blood glucose once a day before breakfast. ?  ?amLODipine 10 MG tablet ?Commonly known as: NORVASC ?Take 1 tablet (10 mg total) by mouth daily. ?  ?Blood Glucose Monitor System w/Device Kit ?Use as directed to check blood glucose once a day before breakfast. ?  ?glucose blood test strip ?Use to test blood glucose daily before breakfast. ?  ?metFORMIN 500 MG tablet ?Commonly known as: Glucophage ?Take 1 tablet (500 mg total) by mouth daily with breakfast. ?  ?pantoprazole 40 MG tablet ?Commonly known as: Protonix ?Take 1 tablet (40 mg total) by mouth daily. ?  ?terbinafine 250 MG tablet ?Commonly known as: LamISIL ?Take 1 tablet (250 mg total) by mouth daily. ?  ?traMADol 50 MG tablet ?Commonly known as: ULTRAM ?Take 1 tablet by mouth every 6 hours as needed for up to 5 days (postop pain not controlled with ibuprofen first). ?  ? ?  ? ? ? ? Follow-up Information   ? ? Ileana Roup, MD. Schedule an appointment as soon as possible for a visit in 2 week(s).   ?Specialties: General Surgery, Colon and Rectal Surgery ?Contact information: ?[address] ?[address] ?[place] [address] ?[phone] ? ? ?  ?  ? ? Charlott Rakes, MD. Schedule an appointment as soon as possible for a visit.   ?Specialty: Family Medicine ?Why: For hospital follow-up. New diabetes. ?Contact information: ?[address] ?[address] ?[place] [address] ?[phone] ? ? ?  ?  ? ?  ?  ? ?  ? ? ?  Amanda Garner, M.D. ?[facility] ?

## 2021-11-06 NOTE — Progress Notes (Signed)
Discharge instructions discussed with patient, verbalized agreement and understanding 

## 2021-11-09 ENCOUNTER — Telehealth: Payer: Self-pay

## 2021-11-09 NOTE — Telephone Encounter (Signed)
Transition Care Management Unsuccessful Follow-up Telephone Call ? ?Date of discharge and from where:  11/06/2021, [facility]  ? ?Attempts:  1st Attempt ? ?Reason for unsuccessful TCM follow-up call:  Left voice message # [phone].  Call back requested.   ? ?Need to discuss scheduling a follow up appointment with PCP ? ? ? ?

## 2021-11-10 ENCOUNTER — Telehealth: Payer: Self-pay

## 2021-11-10 NOTE — Telephone Encounter (Signed)
Transition Care Management Unsuccessful Follow-up Telephone Call ? ?Date of discharge and from where:  11/06/2021, [facility]  ? ?Attempts:  2nd Attempt ? ?Reason for unsuccessful TCM follow-up call:  Left voice message # [phone].  Call back requested.   ?  ?Need to discuss scheduling a follow up appointment with PCP  ? ? ? ?

## 2021-11-11 ENCOUNTER — Telehealth: Payer: Self-pay

## 2021-11-11 NOTE — Telephone Encounter (Signed)
Transition Care Management Unsuccessful Follow-up Telephone Call ? ?Date of discharge and from where:  11/06/2021, [facility]  ? ?Attempts:  3rd Attempt ? ?Reason for unsuccessful TCM follow-up call:  Left voice message  # [phone].  Call back requested.   ? ?Letter sent to patient instructing her to call [facility] to schedule a hospital follow up appointment with her PCP as we have not been able to reach her.  ? ? ? ?

## 2021-12-16 ENCOUNTER — Inpatient Hospital Stay: Payer: 59 | Admitting: Family Medicine

## 2021-12-21 ENCOUNTER — Ambulatory Visit (HOSPITAL_BASED_OUTPATIENT_CLINIC_OR_DEPARTMENT_OTHER): Payer: 59 | Admitting: Pharmacist

## 2021-12-21 ENCOUNTER — Encounter: Payer: Self-pay | Admitting: Family Medicine

## 2021-12-21 ENCOUNTER — Ambulatory Visit: Payer: 59 | Attending: Family Medicine | Admitting: Family Medicine

## 2021-12-21 VITALS — BP 113/80 | HR 87 | Ht 66.0 in | Wt 217.0 lb

## 2021-12-21 DIAGNOSIS — Z79899 Other long term (current) drug therapy: Secondary | ICD-10-CM

## 2021-12-21 DIAGNOSIS — E669 Obesity, unspecified: Secondary | ICD-10-CM

## 2021-12-21 DIAGNOSIS — I10 Essential (primary) hypertension: Secondary | ICD-10-CM | POA: Diagnosis not present

## 2021-12-21 DIAGNOSIS — K219 Gastro-esophageal reflux disease without esophagitis: Secondary | ICD-10-CM

## 2021-12-21 DIAGNOSIS — E1169 Type 2 diabetes mellitus with other specified complication: Secondary | ICD-10-CM | POA: Diagnosis not present

## 2021-12-21 DIAGNOSIS — E119 Type 2 diabetes mellitus without complications: Secondary | ICD-10-CM

## 2021-12-21 DIAGNOSIS — K5909 Other constipation: Secondary | ICD-10-CM | POA: Diagnosis not present

## 2021-12-21 DIAGNOSIS — I152 Hypertension secondary to endocrine disorders: Secondary | ICD-10-CM

## 2021-12-21 DIAGNOSIS — Z6835 Body mass index (BMI) 35.0-35.9, adult: Secondary | ICD-10-CM

## 2021-12-21 DIAGNOSIS — Z9049 Acquired absence of other specified parts of digestive tract: Secondary | ICD-10-CM

## 2021-12-21 DIAGNOSIS — R052 Subacute cough: Secondary | ICD-10-CM

## 2021-12-21 LAB — GLUCOSE, POCT (MANUAL RESULT ENTRY): POC Glucose: 178 mg/dl — AB (ref 70–99)

## 2021-12-21 MED ORDER — LIDOCAINE-PRILOCAINE 2.5-2.5 % EX CREA: 1.0000 "application " | TOPICAL_CREAM | CUTANEOUS | 1 refills | Status: DC | PRN

## 2021-12-21 MED ORDER — BENZONATATE 100 MG PO CAPS: 100.0000 mg | ORAL_CAPSULE | Freq: Two times a day (BID) | ORAL | 0 refills | Status: DC | PRN

## 2021-12-21 MED ORDER — ATORVASTATIN CALCIUM 20 MG PO TABS: 20.0000 mg | ORAL_TABLET | Freq: Every day | ORAL | 1 refills | Status: DC

## 2021-12-21 MED ORDER — PANTOPRAZOLE SODIUM 40 MG PO TBEC: 40.0000 mg | DELAYED_RELEASE_TABLET | Freq: Every day | ORAL | 1 refills | Status: DC

## 2021-12-21 MED ORDER — ACCU-CHEK FASTCLIX LANCETS MISC: 1 refills | Status: AC

## 2021-12-21 MED ORDER — OZEMPIC (0.25 OR 0.5 MG/DOSE) 2 MG/1.5ML ~~LOC~~ SOPN: 0.2500 mg | PEN_INJECTOR | SUBCUTANEOUS | 6 refills | Status: DC

## 2021-12-21 MED ORDER — AMLODIPINE BESYLATE 10 MG PO TABS: 10.0000 mg | ORAL_TABLET | Freq: Every day | ORAL | 1 refills | Status: DC

## 2021-12-21 NOTE — Progress Notes (Signed)
Discuss metformin ?Discuss weight loss ?Dry cough ?[id number]  ?

## 2021-12-21 NOTE — Progress Notes (Signed)
Subjective:  Patient ID: Amanda Garner, female    DOB: 07-03-1971  Age: 51 y.o. MRN: [id number]  CC: Hospitalization Follow-up   HPI Amanda Garner is a 51 y.o. year old female with a history of hypertension, GERD, Crohn's disease, diverticulitis (status post lower resection of colovaginal fistula in 10/2021 status post laparoscopic-assisted sigmoidectomy on 11/2021), type 2 diabetes mellitus (A1c 6.6) here for chronic disease management. During her hospitalization she was commenced on metformin for new diagnosis of type 2 diabetes mellitus with A1c of 6.6.  Interval History:  She has been constipated since her surgery, bowel movement occurs every 4-5 days and she finds she is straining. She also has some tenderness when she touches her right lower quadrant at the site of surgical scar.  Her scars have healed.  Metformin makes her nauseated .Marland Kitchen  States her Surgeon has released her and she is no longer followed by her GI - Dr Rhea Belton  She has had a dry cough and Delsum has been effective.  Cough has been present since she underwent intubation during her surgery.  Past Medical History:  Diagnosis Date   Acid reflux    Blood transfusion    Diverticulitis    Hypertension    Sickle cell trait [facility])     Past Surgical History:  Procedure Laterality Date   ABDOMINAL HYSTERECTOMY  2001   partial hysterectomy - emergency during last C-section   CESAREAN SECTION  1991; 1997; 2001   CESAREAN SECTION     FLEXIBLE SIGMOIDOSCOPY N/A 11/04/2021   Procedure: FLEXIBLE SIGMOIDOSCOPY;  Surgeon: Andria Meuse, MD;  Location: [facility];  Service: General;  Laterality: N/A;   XI ROBOTIC ASSISTED LOWER ANTERIOR RESECTION N/A 11/04/2021   Procedure: XI ROBOTIC ASSISTED LOWER ANTERIOR RESECTION, RESECTION OF COLOVAGINAL FISTULA, BILATERAL TAP BLOCK, INJECTION OF FIREFLY;  Surgeon: Andria Meuse, MD;  Location: [facility];  Service: General;  Laterality: N/A;    Family History  Problem Relation Age  of Onset   Hypertension Mother    Diabetes Mother    Diabetes type II Mother    Diabetes type II Father    Stroke Father    Lung cancer Father 20   Esophageal cancer Maternal Grandfather    Colon cancer Neg Hx    Pancreatic cancer Neg Hx    Stomach cancer Neg Hx     Social History   Socioeconomic History   Marital status: Married    Spouse name: Not on file   Number of children: 3   Years of education: Not on file   Highest education level: Not on file  Occupational History   Occupation: Brewing technologist: [employer]    Comment: Company secretary  Tobacco Use   Smoking status: Every Day    Packs/day: 0.50    Years: 31.00    Pack years: 15.50    Types: Cigarettes   Smokeless tobacco: Never  Vaping Use   Vaping Use: Never used  Substance and Sexual Activity   Alcohol use: Yes    Alcohol/week: 1.0 standard drink    Types: 1 Standard drinks or equivalent per week    Comment: occassionally   Drug use: Yes    Frequency: 7.0 times per week    Types: Marijuana   Sexual activity: Yes    Partners: Male    Birth control/protection: Surgical  Other Topics Concern   Not on file  Social History Narrative   ** Merged History Encounter **  Social Determinants of Health   Financial Resource Strain: Not on file  Food Insecurity: Not on file  Transportation Needs: Not on file  Physical Activity: Not on file  Stress: Not on file  Social Connections: Not on file    No Known Allergies  Outpatient Medications Prior to Visit  Medication Sig Dispense Refill   Blood Glucose Monitoring Suppl (BLOOD GLUCOSE MONITOR SYSTEM) w/Device KIT Use as directed to check blood glucose once a day before breakfast. 1 kit 0   glucose blood test strip Use to test blood glucose daily before breakfast. 100 each 0   terbinafine (LAMISIL) 250 MG tablet Take 1 tablet (250 mg total) by mouth daily. 60 tablet 0   Accu-Chek FastClix Lancets MISC Use as  directed to check blood glucose once a day before breakfast. 102 each 0   amLODipine (NORVASC) 10 MG tablet Take 1 tablet (10 mg total) by mouth daily. 90 tablet 1   pantoprazole (PROTONIX) 40 MG tablet Take 1 tablet (40 mg total) by mouth daily. 90 tablet 1   metFORMIN (GLUCOPHAGE) 500 MG tablet Take 1 tablet (500 mg total) by mouth daily with breakfast. 30 tablet 0   No facility-administered medications prior to visit.     ROS Review of Systems  Constitutional:  Negative for activity change, appetite change and fatigue.  HENT:  Negative for congestion, sinus pressure and sore throat.   Eyes:  Negative for visual disturbance.  Respiratory:  Negative for cough, chest tightness, shortness of breath and wheezing.   Cardiovascular:  Negative for chest pain and palpitations.  Gastrointestinal:  Positive for constipation. Negative for abdominal distention and abdominal pain.  Endocrine: Negative for polydipsia.  Genitourinary:  Negative for dysuria and frequency.  Musculoskeletal:  Negative for arthralgias and back pain.  Skin:  Negative for rash.  Neurological:  Negative for tremors, light-headedness and numbness.  Hematological:  Does not bruise/bleed easily.  Psychiatric/Behavioral:  Negative for agitation and behavioral problems.    Objective:  BP 113/80   Pulse 87   Ht 5\' 6"  (1.676 m)   Wt 217 lb (98.4 kg)   LMP 08/20/2011   SpO2 99%   BMI 35.02 kg/m      12/21/2021    9:51 AM 11/06/2021    5:03 AM 11/05/2021    9:17 PM  BP/Weight  Systolic BP 113 124 129  Diastolic BP 80 82 75  Wt. (Lbs) 217    BMI 35.02 kg/m2        Physical Exam Constitutional:      Appearance: She is well-developed.  Cardiovascular:     Rate and Rhythm: Normal rate.     Heart sounds: Normal heart sounds. No murmur heard. Pulmonary:     Effort: Pulmonary effort is normal.     Breath sounds: Normal breath sounds. No wheezing or rales.  Chest:     Chest wall: No tenderness.  Abdominal:      General: Bowel sounds are normal. There is no distension.     Palpations: Abdomen is soft. There is no mass.     Tenderness: There is no abdominal tenderness.     Comments: Healed laparoscopic surgical scars. Slight tenderness on palpation of lower abdominal wall.  Musculoskeletal:        General: Normal range of motion.     Right lower leg: No edema.     Left lower leg: No edema.  Neurological:     Mental Status: She is alert and oriented to person, place,  and time.  Psychiatric:        Mood and Affect: Mood normal.       Latest Ref Rng & Units 11/06/2021    4:09 AM 11/05/2021    4:24 AM 11/04/2021    3:25 PM  CMP  Glucose 70 - 99 mg/dL 960   454     BUN 6 - 20 mg/dL 11   13     Creatinine 0.44 - 1.00 mg/dL 0.98   1.19   1.47    Sodium 135 - 145 mmol/L 138   133     Potassium 3.5 - 5.1 mmol/L 3.4   3.8     Chloride 98 - 111 mmol/L 106   104     CO2 22 - 32 mmol/L 25   22     Calcium 8.9 - 10.3 mg/dL 8.7   8.3       Lipid Panel     Component Value Date/Time   CHOL 136 11/05/2021 0424   CHOL 144 02/11/2020 0915   TRIG 168 (H) 11/05/2021 0424   HDL 31 (L) 11/05/2021 0424   HDL 40 02/11/2020 0915   CHOLHDL 4.4 11/05/2021 0424   VLDL 34 11/05/2021 0424   LDLCALC 71 11/05/2021 0424   LDLCALC 67 02/11/2020 0915    CBC    Component Value Date/Time   WBC 10.3 11/06/2021 0409   RBC 4.58 11/06/2021 0409   HGB 10.0 (L) 11/06/2021 0409   HGB 13.0 01/31/2020 0953   HCT 31.3 (L) 11/06/2021 0409   HCT 43.0 01/31/2020 0953   PLT 225 11/06/2021 0409   PLT 318 01/31/2020 0953   MCV 68.3 (L) 11/06/2021 0409   MCV 71 (L) 01/31/2020 0953   MCH 21.8 (L) 11/06/2021 0409   MCHC 31.9 11/06/2021 0409   RDW 14.5 11/06/2021 0409   RDW 17.0 (H) 01/31/2020 0953   LYMPHSABS 2.7 10/28/2021 1028   LYMPHSABS 2.8 01/31/2020 0953   MONOABS 0.5 10/28/2021 1028   EOSABS 0.2 10/28/2021 1028   EOSABS 0.3 01/31/2020 0953   BASOSABS 0.1 10/28/2021 1028   BASOSABS 0.0 01/31/2020 0953    Lab  Results  Component Value Date   HGBA1C 6.6 (H) 10/28/2021    Assessment & Plan:  1. Type 2 diabetes mellitus with other specified complication, without long-term current use of insulin (HCC) New diagnosis with A1c of 6.6 Due to GI intolerance I have discontinued metformin Counseled on benefits, risks, adverse effects of GLP-1 RA and after shared decision-making she would like to comment  - POCT glucose (manual entry) - atorvastatin (LIPITOR) 20 MG tablet; Take 1 tablet (20 mg total) by mouth daily.  Dispense: 90 tablet; Refill: 1 - Semaglutide,0.25 or 0.5MG /DOS, (OZEMPIC, 0.25 OR 0.5 MG/DOSE,) 2 MG/1.5ML SOPN; Inject 0.25 mg into the skin once a week.  Dispense: 2 mL; Refill: 6 - Accu-Chek FastClix Lancets MISC; Use as directed to check blood glucose once a day before breakfast.  Dispense: 100 each; Refill: 1 - Microalbumin/Creatinine Ratio, Urine - Basic Metabolic Panel  2. Other constipation She currently does not take any opioids Advised to increase fiber, water intake She does have MiraLAX at home and has been advised to use this  3. Gastroesophageal reflux disease without esophagitis Stable  Reassess need for PPI and she strongly requests this as symptoms return in the absence of PPI - pantoprazole (PROTONIX) 40 MG tablet; Take 1 tablet (40 mg total) by mouth daily.  Dispense: 90 tablet; Refill: 1  4. Essential hypertension  Controlled - amLODipine (NORVASC) 10 MG tablet; Take 1 tablet (10 mg total) by mouth daily.  Dispense: 90 tablet; Refill: 1  5. Obesity, diabetes, and hypertension syndrome (HCC) Initiation of GLP-1 will be beneficial with regards to weight loss  6. Subacute cough Secondary to intubation - benzonatate (TESSALON) 100 MG capsule; Take 1 capsule (100 mg total) by mouth 2 (two) times daily as needed for cough.  Dispense: 20 capsule; Refill: 0  7. S/P laparoscopic-assisted sigmoidectomy We will use topical analgesic at the site of her surgical scars -  lidocaine-prilocaine (EMLA) cream; Apply 1 application. topically as needed.  Dispense: 30 g; Refill: 1    Meds ordered this encounter  Medications   atorvastatin (LIPITOR) 20 MG tablet    Sig: Take 1 tablet (20 mg total) by mouth daily.    Dispense:  90 tablet    Refill:  1   Semaglutide,0.25 or 0.5MG /DOS, (OZEMPIC, 0.25 OR 0.5 MG/DOSE,) 2 MG/1.5ML SOPN    Sig: Inject 0.25 mg into the skin once a week.    Dispense:  2 mL    Refill:  6   Accu-Chek FastClix Lancets MISC    Sig: Use as directed to check blood glucose once a day before breakfast.    Dispense:  100 each    Refill:  1   pantoprazole (PROTONIX) 40 MG tablet    Sig: Take 1 tablet (40 mg total) by mouth daily.    Dispense:  90 tablet    Refill:  1   amLODipine (NORVASC) 10 MG tablet    Sig: Take 1 tablet (10 mg total) by mouth daily.    Dispense:  90 tablet    Refill:  1   benzonatate (TESSALON) 100 MG capsule    Sig: Take 1 capsule (100 mg total) by mouth 2 (two) times daily as needed for cough.    Dispense:  20 capsule    Refill:  0   lidocaine-prilocaine (EMLA) cream    Sig: Apply 1 application. topically as needed.    Dispense:  30 g    Refill:  1    Follow-up: Return for Pap smear at next visit.       Hoy Register, MD, FAAFP. [facility] [place], [place] [phone]   12/21/2021, 11:44 AM

## 2021-12-21 NOTE — Progress Notes (Signed)
Patient was educated on the use of the Ozempic pen. Reviewed necessary supplies and operation of the pen. Also reviewed goal blood glucose levels. Patient was able to demonstrate use. All questions and concerns were addressed. ? ?Time counseling: 15 minutes ? ?Benard Halsted, PharmD, BCACP, CPP ?Clinical Pharmacist ?[place] ?[phone] ? ?

## 2021-12-22 LAB — BASIC METABOLIC PANEL
BUN/Creatinine Ratio: 16 (ref 9–23)
BUN: 12 mg/dL (ref 6–24)
CO2: 22 mmol/L (ref 20–29)
Calcium: 10.1 mg/dL (ref 8.7–10.2)
Chloride: 106 mmol/L (ref 96–106)
Creatinine, Ser: 0.73 mg/dL (ref 0.57–1.00)
Glucose: 146 mg/dL — ABNORMAL HIGH (ref 70–99)
Potassium: 4.1 mmol/L (ref 3.5–5.2)
Sodium: 143 mmol/L (ref 134–144)
eGFR: 100 mL/min/{1.73_m2} (ref >59)

## 2021-12-22 LAB — MICROALBUMIN / CREATININE URINE RATIO
Creatinine, Urine: 234.2 mg/dL
Microalb/Creat Ratio: 14 mg/g creat (ref 0–29)
Microalbumin, Urine: 32.9 ug/mL

## 2022-01-01 ENCOUNTER — Other Ambulatory Visit: Payer: Self-pay

## 2022-01-11 ENCOUNTER — Inpatient Hospital Stay: Payer: 59 | Admitting: Family Medicine

## 2022-01-25 ENCOUNTER — Ambulatory Visit: Payer: 59 | Admitting: Family Medicine

## 2022-02-01 ENCOUNTER — Encounter: Payer: Self-pay | Admitting: Family Medicine

## 2022-02-01 ENCOUNTER — Other Ambulatory Visit (HOSPITAL_COMMUNITY)
Admission: RE | Admit: 2022-02-01 | Discharge: 2022-02-01 | Disposition: A | Discharge status: Home / Self Care | Payer: 59 | Source: Ambulatory Visit | Attending: Family Medicine | Admitting: Family Medicine

## 2022-02-01 ENCOUNTER — Ambulatory Visit: Payer: 59 | Attending: Family Medicine | Admitting: Family Medicine

## 2022-02-01 VITALS — BP 119/86 | HR 102 | Temp 98.6°F | Resp 16 | Wt 212.0 lb

## 2022-02-01 DIAGNOSIS — Z01419 Encounter for gynecological examination (general) (routine) without abnormal findings: Secondary | ICD-10-CM

## 2022-02-01 DIAGNOSIS — Z6834 Body mass index (BMI) 34.0-34.9, adult: Secondary | ICD-10-CM

## 2022-02-01 DIAGNOSIS — E668 Other obesity: Secondary | ICD-10-CM

## 2022-02-01 DIAGNOSIS — F17210 Nicotine dependence, cigarettes, uncomplicated: Secondary | ICD-10-CM | POA: Diagnosis not present

## 2022-02-01 DIAGNOSIS — Z124 Encounter for screening for malignant neoplasm of cervix: Secondary | ICD-10-CM | POA: Diagnosis present

## 2022-02-01 MED ORDER — BUPROPION HCL ER (XL) 150 MG PO TB24: 150.0000 mg | ORAL_TABLET | Freq: Every day | ORAL | 1 refills | Status: DC

## 2022-02-01 NOTE — Progress Notes (Signed)
Subjective:  Patient ID: Amanda Garner, female    DOB: 1971/04/08  Age: 51 y.o. MRN: [id number]  CC: Gynecologic Exam and Medication Refill   HPI Amanda Garner is a 51 y.o. year old female with a history of hypertension, GERD, Crohn's disease, diverticulitis (status post lower resection of colovaginal fistula in 10/2021 status post laparoscopic-assisted sigmoidectomy on 11/2021), type 2 diabetes mellitus (A1c 6.6).  Interval History: Today she presents for a Pap smear. She is up-to-date on her mammogram from 09/2021-negative for malignancy Last colonoscopy was in 08/2021. She does not exercise regularly.  She would like a refill on terbinafine which was prescribed by her podiatrist and she informs me she has been on it for over 2 years.  Advised she will need to contact podiatry if she does not need to be on terbinafine for that prolonged duration.  Past Medical History:  Diagnosis Date   Acid reflux    Blood transfusion    Diverticulitis    Hypertension    Sickle cell trait [facility])     Past Surgical History:  Procedure Laterality Date   ABDOMINAL HYSTERECTOMY  2001   partial hysterectomy - emergency during last C-section   [place]; 1997; 2001   [place] N/A 11/04/2021   Procedure: FLEXIBLE SIGMOIDOSCOPY;  Surgeon: Ileana Roup, MD;  Location: [facility];  Service: General;  Laterality: N/A;   XI ROBOTIC ASSISTED LOWER ANTERIOR RESECTION N/A 11/04/2021   Procedure: XI ROBOTIC ASSISTED LOWER ANTERIOR RESECTION, RESECTION OF COLOVAGINAL FISTULA, BILATERAL TAP BLOCK, INJECTION OF FIREFLY;  Surgeon: Ileana Roup, MD;  Location: [facility];  Service: General;  Laterality: N/A;    Family History  Problem Relation Age of Onset   Hypertension Mother    Diabetes Mother    Diabetes type II Mother    Diabetes type II Father    Stroke Father    Lung cancer Father 20   Esophageal cancer Maternal Grandfather    Colon cancer Neg  Hx    Pancreatic cancer Neg Hx    Stomach cancer Neg Hx     Social History   Socioeconomic History   Marital status: Married    Spouse name: Not on file   Number of children: 3   Years of education: Not on file   Highest education level: Not on file  Occupational History   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, general practice  Tobacco Use   Smoking status: Every Day    Packs/day: 0.50    Years: 31.00    Total pack years: 15.50    Types: Cigarettes   Smokeless tobacco: Never  Vaping Use   Vaping Use: Never used  Substance and Sexual Activity   Alcohol use: Yes    Alcohol/week: 1.0 standard drink of alcohol    Types: 1 Standard drinks or equivalent per week    Comment: occassionally   Drug use: Yes    Frequency: 7.0 times per week    Types: Marijuana   Sexual activity: Yes    Partners: Male    Birth control/protection: Surgical  Other Topics Concern   Not on file  Social History Narrative   ** Merged History Encounter **       Social Determinants of Health   Financial Resource Strain: Not on file  Food Insecurity: Not on file  Transportation Needs: Not on file  Physical Activity: Not on  file  Stress: Not on file  Social Connections: Not on file    No Known Allergies  Outpatient Medications Prior to Visit  Medication Sig Dispense Refill   amLODipine (NORVASC) 10 MG tablet Take 1 tablet (10 mg total) by mouth daily. 90 tablet 1   atorvastatin (LIPITOR) 20 MG tablet Take 1 tablet (20 mg total) by mouth daily. 90 tablet 1   pantoprazole (PROTONIX) 40 MG tablet Take 1 tablet (40 mg total) by mouth daily. 90 tablet 1   Semaglutide,0.25 or 0.5MG/DOS, (OZEMPIC, 0.25 OR 0.5 MG/DOSE,) 2 MG/1.5ML SOPN Inject 0.25 mg into the skin once a week. 2 mL 6   Accu-Chek FastClix Lancets MISC Use as directed to check blood glucose once a day before breakfast. 100 each 1   benzonatate (TESSALON) 100 MG capsule Take 1  capsule (100 mg total) by mouth 2 (two) times daily as needed for cough. 20 capsule 0   Blood Glucose Monitoring Suppl (BLOOD GLUCOSE MONITOR SYSTEM) w/Device KIT Use as directed to check blood glucose once a day before breakfast. 1 kit 0   glucose blood test strip Use to test blood glucose daily before breakfast. 100 each 0   lidocaine-prilocaine (EMLA) cream Apply 1 application. topically as needed. 30 g 1   terbinafine (LAMISIL) 250 MG tablet Take 1 tablet (250 mg total) by mouth daily. 60 tablet 0   No facility-administered medications prior to visit.     ROS Review of Systems  Constitutional:  Negative for activity change and appetite change.  HENT:  Negative for sinus pressure and sore throat.   Respiratory:  Negative for chest tightness, shortness of breath and wheezing.   Cardiovascular:  Negative for chest pain and palpitations.  Gastrointestinal:  Negative for abdominal distention, abdominal pain and constipation.  Genitourinary: Negative.   Musculoskeletal: Negative.   Psychiatric/Behavioral:  Negative for behavioral problems and dysphoric mood.     Objective:  BP 119/86 (BP Location: Left Arm, Patient Position: Sitting, Cuff Size: Large)   Pulse (!) 102   Temp 98.6 F (37 C) (Oral)   Resp 16   Wt 212 lb (96.2 kg)   LMP 08/20/2011   SpO2 97%   BMI 34.22 kg/m      02/01/2022    1:47 PM 12/21/2021    9:51 AM 11/06/2021    5:03 AM  BP/Weight  Systolic BP 628 315 176  Diastolic BP 86 80 82  Wt. (Lbs) 212 217   BMI 34.22 kg/m2 35.02 kg/m2       Physical Exam Constitutional:      Appearance: She is well-developed.  Cardiovascular:     Rate and Rhythm: Normal rate.     Heart sounds: Normal heart sounds. No murmur heard. Pulmonary:     Effort: Pulmonary effort is normal.     Breath sounds: Normal breath sounds. No wheezing or rales.  Chest:     Chest wall: No tenderness.  Abdominal:     General: Bowel sounds are normal. There is no distension.      Palpations: Abdomen is soft. There is no mass.     Tenderness: There is no abdominal tenderness.  Genitourinary:    Comments: External genitalia, vagina, cervix, adnexa-normal Musculoskeletal:        General: Normal range of motion.     Right lower leg: No edema.     Left lower leg: No edema.  Neurological:     Mental Status: She is alert and oriented to person, place, and time.  Psychiatric:        Mood and Affect: Mood normal.        Latest Ref Rng & Units 12/21/2021   10:46 AM 11/06/2021    4:09 AM 11/05/2021    4:24 AM  CMP  Glucose 70 - 99 mg/dL 146  146  138   BUN 6 - 24 mg/dL _0 Creatinine 0.57 - 1.00 mg/dL 0.73  0.67  0.80   Sodium 134 - 144 mmol/L 143  138  133   Potassium 3.5 - 5.2 mmol/L 4.1  3.4  3.8   Chloride 96 - 106 mmol/L 106  106  104   CO2 20 - 29 mmol/L _1 Calcium 8.7 - 10.2 mg/dL 10.1  8.7  8.3     Lipid Panel     Component Value Date/Time   CHOL 136 11/05/2021 0424   CHOL 144 02/11/2020 0915   TRIG 168 (H) 11/05/2021 0424   HDL 31 (L) 11/05/2021 0424   HDL 40 02/11/2020 0915   CHOLHDL 4.4 11/05/2021 0424   VLDL 34 11/05/2021 0424   LDLCALC 71 11/05/2021 0424   LDLCALC 67 02/11/2020 0915    CBC    Component Value Date/Time   WBC 10.3 11/06/2021 0409   RBC 4.58 11/06/2021 0409   HGB 10.0 (L) 11/06/2021 0409   HGB 13.0 01/31/2020 0953   HCT 31.3 (L) 11/06/2021 0409   HCT 43.0 01/31/2020 0953   PLT 225 11/06/2021 0409   PLT 318 01/31/2020 0953   MCV 68.3 (L) 11/06/2021 0409   MCV 71 (L) 01/31/2020 0953   MCH 21.8 (L) 11/06/2021 0409   MCHC 31.9 11/06/2021 0409   RDW 14.5 11/06/2021 0409   RDW 17.0 (H) 01/31/2020 0953   LYMPHSABS 2.7 10/28/2021 1028   LYMPHSABS 2.8 01/31/2020 0953   MONOABS 0.5 10/28/2021 1028   EOSABS 0.2 10/28/2021 1028   EOSABS 0.3 01/31/2020 0953   BASOSABS 0.1 10/28/2021 1028   BASOSABS 0.0 01/31/2020 0953    Lab Results  Component Value Date   HGBA1C 6.6 (H) 10/28/2021    Assessment &  Plan:  1. Encounter for well woman exam with routine gynecological exam Counseled on 150 minutes of exercise per week, healthy eating (including decreased daily intake of saturated fats, cholesterol, added sugars, sodium), routine healthcare maintenance.   2. Moderate obesity She has lost 5 pounds since initiation of GLP-1 RA at last visit and has been commended on this She will begin a regular exercise regimen  3. Smoking greater than 20 pack years Spent 3 minutes counseling on smoking cessation and hazardous effect of smoking and she is willing to be placed on Wellbutrin - CT CHEST LUNG CANCER SCREENING LOW DOSE WO CONTRAST; Future - buPROPion (WELLBUTRIN XL) 150 MG 24 hr tablet; Take 1 tablet (150 mg total) by mouth daily. For smoking cessation  Dispense: 90 tablet; Refill: 1  4. Screening for cervical cancer - Cytology - PAP    Meds ordered this encounter  Medications   buPROPion (WELLBUTRIN XL) 150 MG 24 hr tablet    Sig: Take 1 tablet (150 mg total) by mouth daily. For smoking cessation    Dispense:  90 tablet    Refill:  1    Follow-up: Return in about 6 months (around 08/03/2022) for Chronic medical conditions.       Charlott Rakes, MD, FAAFP. [facility] and [facility] [place], [place]  02/01/2022, 2:45 PM

## 2022-02-01 NOTE — Progress Notes (Signed)
Request refill for lamisil

## 2022-02-02 LAB — CYTOLOGY - PAP
Comment: NEGATIVE
Diagnosis: NEGATIVE
High risk HPV: NEGATIVE

## 2022-02-10 ENCOUNTER — Ambulatory Visit: Payer: 59 | Admitting: Family Medicine

## 2022-03-08 ENCOUNTER — Inpatient Hospital Stay: Admission: RE | Admit: 2022-03-08 | Payer: 59 | Source: Ambulatory Visit

## 2022-03-19 ENCOUNTER — Encounter: Payer: Self-pay | Admitting: Internal Medicine

## 2022-03-23 ENCOUNTER — Ambulatory Visit: Payer: Self-pay

## 2022-03-23 NOTE — Telephone Encounter (Signed)
  Chief Complaint: Both legs ache, frequent charley horses Symptoms: ibid Frequency: 2-3 months Pertinent Negatives: Patient denies redness, fever, sob, swelling Disposition: [] ED /[x] Urgent Care (no appt availability in office) / [x] Appointment(In office/virtual)/ []  [facility]/ [] Home Care/ [x] Refused Recommended Disposition /[] [facility] [place] [facility]/ []  Follow-up with PCP Additional Notes: Pt states she has frequent leg cramps. She also states that legs are achy. PT will go to UC if needed but has made an appt in September to be seen in the office. Reason for Disposition  Leg pain or muscle cramp is a chronic symptom (recurrent or ongoing AND present > 4 weeks)  Answer Assessment - Initial Assessment Questions 1. ONSET: "When did the pain start?"      2-3 months 2. LOCATION: "Where is the pain located?"      Both - cramping  - charley horse. Legs ache everyday 3. PAIN: "How bad is the pain?"    (Scale 1-10; or mild, moderate, severe)   -  MILD (1-3): doesn't interfere with normal activities    -  MODERATE (4-7): interferes with normal activities (e.g., work or school) or awakens from sleep, limping    -  SEVERE (8-10): excruciating pain, unable to do any normal activities, unable to walk     10/10 4. WORK OR EXERCISE: "Has there been any recent work or exercise that involved this part of the body?"      no 5. CAUSE: "What do you think is causing the leg pain?"     Overweight. 6. OTHER SYMPTOMS: "Do you have any other symptoms?" (e.g., chest pain, back pain, breathing difficulty, swelling, rash, fever, numbness, weakness)     nope 7. PREGNANCY: "Is there any chance you are pregnant?" "When was your last menstrual period?"     na  Protocols used: Leg Pain-A-AH

## 2022-03-23 NOTE — Telephone Encounter (Signed)
Scheduled apt with PCP for 03/29/2022.

## 2022-03-29 ENCOUNTER — Encounter: Payer: Self-pay | Admitting: Family Medicine

## 2022-03-29 ENCOUNTER — Ambulatory Visit: Payer: 59 | Attending: Family Medicine | Admitting: Family Medicine

## 2022-03-29 VITALS — BP 135/84 | HR 93 | Temp 98.1°F | Resp 18 | Ht 66.0 in | Wt 216.0 lb

## 2022-03-29 DIAGNOSIS — E1169 Type 2 diabetes mellitus with other specified complication: Secondary | ICD-10-CM | POA: Diagnosis not present

## 2022-03-29 DIAGNOSIS — Z111 Encounter for screening for respiratory tuberculosis: Secondary | ICD-10-CM

## 2022-03-29 DIAGNOSIS — F17210 Nicotine dependence, cigarettes, uncomplicated: Secondary | ICD-10-CM | POA: Diagnosis not present

## 2022-03-29 DIAGNOSIS — I10 Essential (primary) hypertension: Secondary | ICD-10-CM

## 2022-03-29 DIAGNOSIS — M791 Myalgia, unspecified site: Secondary | ICD-10-CM | POA: Diagnosis not present

## 2022-03-29 DIAGNOSIS — Z23 Encounter for immunization: Secondary | ICD-10-CM | POA: Diagnosis not present

## 2022-03-29 LAB — POCT GLYCOSYLATED HEMOGLOBIN (HGB A1C): Hemoglobin A1C: 6.5 % — AB (ref 4.0–5.6)

## 2022-03-29 LAB — GLUCOSE, POCT (MANUAL RESULT ENTRY): POC Glucose: 145 mg/dl — AB (ref 70–99)

## 2022-03-29 MED ORDER — OZEMPIC (0.25 OR 0.5 MG/DOSE) 2 MG/1.5ML ~~LOC~~ SOPN: 0.5000 mg | PEN_INJECTOR | SUBCUTANEOUS | 6 refills | Status: DC

## 2022-03-29 MED ORDER — GABAPENTIN 300 MG PO CAPS: 300.0000 mg | ORAL_CAPSULE | Freq: Every day | ORAL | 3 refills | Status: DC

## 2022-03-29 MED ORDER — BUPROPION HCL ER (XL) 150 MG PO TB24: 150.0000 mg | ORAL_TABLET | Freq: Every day | ORAL | 1 refills | Status: DC

## 2022-03-29 MED ORDER — NAPROXEN 500 MG PO TABS: 500.0000 mg | ORAL_TABLET | Freq: Two times a day (BID) | ORAL | 0 refills | Status: DC

## 2022-03-29 NOTE — Progress Notes (Signed)
Subjective:  Patient ID: Amanda Garner, female    DOB: 1971-03-23  Age: 51 y.o. MRN: [id number]  CC: Leg Pain (B\L leg pain upper quad to heels x 1 month//Pain meds help/Asks people for pain meds/4 tylenol alleviates pain for 6 hours)   HPI Amanda Garner is a 51 y.o. year old female with a history of hypertension, GERD, Crohn's disease, diverticulitis (status post lower resection of colovaginal fistula in 10/2021 status post laparoscopic-assisted sigmoidectomy on 11/2021), type 2 diabetes mellitus (A1c 6.6).  Interval History: She has had pain from her mid thighs down her legs x2 months all day every day and she has to take tylenol which only provides relief for 6 hours. It feels like an ache and she has to beat her leg for relief. She also has tingling and numbness  She Complains of gaining weight despite being on Ozempic. She has gained 4 lbs since last visit 3 months. Diabetes is controlled with no hypoglycemia. She is doing well on her antihypertensive and her statin.  I had placed her on bupropion for smoking cessation but she never picked this up from the pharmacy due to finances.  States she is working on cutting down her smoking. Past Medical History:  Diagnosis Date   Acid reflux    Blood transfusion    Diverticulitis    Hypertension    Sickle cell trait [facility])     Past Surgical History:  Procedure Laterality Date   ABDOMINAL HYSTERECTOMY  2001   partial hysterectomy - emergency during last C-section   [place]; 1997; 2001   [place] N/A 11/04/2021   Procedure: FLEXIBLE SIGMOIDOSCOPY;  Surgeon: Ileana Roup, MD;  Location: [facility];  Service: General;  Laterality: N/A;   XI ROBOTIC ASSISTED LOWER ANTERIOR RESECTION N/A 11/04/2021   Procedure: XI ROBOTIC ASSISTED LOWER ANTERIOR RESECTION, RESECTION OF COLOVAGINAL FISTULA, BILATERAL TAP BLOCK, INJECTION OF FIREFLY;  Surgeon: Ileana Roup, MD;  Location: [facility];   Service: General;  Laterality: N/A;    Family History  Problem Relation Age of Onset   Hypertension Mother    Diabetes Mother    Diabetes type II Mother    Diabetes type II Father    Stroke Father    Lung cancer Father 84   Esophageal cancer Maternal Grandfather    Colon cancer Neg Hx    Pancreatic cancer Neg Hx    Stomach cancer Neg Hx     Social History   Socioeconomic History   Marital status: Married    Spouse name: Not on file   Number of children: 3   Years of education: Not on file   Highest education level: Not on file  Occupational History   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, general practice  Tobacco Use   Smoking status: Every Day    Packs/day: 0.50    Years: 31.00    Total pack years: 15.50    Types: Cigarettes   Smokeless tobacco: Never  Vaping Use   Vaping Use: Never used  Substance and Sexual Activity   Alcohol use: Yes    Alcohol/week: 1.0 standard drink of alcohol    Types: 1 Standard drinks or equivalent per week    Comment: occassionally   Drug use: Yes    Frequency: 7.0 times per week    Types: Marijuana   Sexual activity: Yes    Partners: Male  Birth control/protection: Surgical  Other Topics Concern   Not on file  Social History Narrative   ** Merged History Encounter **       Social Determinants of Health   Financial Resource Strain: Not on file  Food Insecurity: Not on file  Transportation Needs: Not on file  Physical Activity: Not on file  Stress: Not on file  Social Connections: Not on file    No Known Allergies  Outpatient Medications Prior to Visit  Medication Sig Dispense Refill   Accu-Chek FastClix Lancets MISC Use as directed to check blood glucose once a day before breakfast. 100 each 1   amLODipine (NORVASC) 10 MG tablet Take 1 tablet (10 mg total) by mouth daily. 90 tablet 1   atorvastatin (LIPITOR) 20 MG tablet Take 1 tablet (20 mg total) by mouth  daily. 90 tablet 1   Blood Glucose Monitoring Suppl (BLOOD GLUCOSE MONITOR SYSTEM) w/Device KIT Use as directed to check blood glucose once a day before breakfast. 1 kit 0   glucose blood test strip Use to test blood glucose daily before breakfast. 100 each 0   pantoprazole (PROTONIX) 40 MG tablet Take 1 tablet (40 mg total) by mouth daily. 90 tablet 1   terbinafine (LAMISIL) 250 MG tablet Take 1 tablet (250 mg total) by mouth daily. (Patient not taking: Reported on 03/29/2022) 60 tablet 0   benzonatate (TESSALON) 100 MG capsule Take 1 capsule (100 mg total) by mouth 2 (two) times daily as needed for cough. 20 capsule 0   buPROPion (WELLBUTRIN XL) 150 MG 24 hr tablet Take 1 tablet (150 mg total) by mouth daily. For smoking cessation 90 tablet 1   lidocaine-prilocaine (EMLA) cream Apply 1 application. topically as needed. 30 g 1   Semaglutide,0.25 or 0.5MG/DOS, (OZEMPIC, 0.25 OR 0.5 MG/DOSE,) 2 MG/1.5ML SOPN Inject 0.25 mg into the skin once a week. 2 mL 6   No facility-administered medications prior to visit.     ROS Review of Systems  Constitutional:  Negative for activity change, appetite change and fatigue.  HENT:  Negative for congestion, sinus pressure and sore throat.   Eyes:  Negative for visual disturbance.  Respiratory:  Negative for cough, chest tightness, shortness of breath and wheezing.   Cardiovascular:  Negative for chest pain and palpitations.  Gastrointestinal:  Negative for abdominal distention, abdominal pain, anal bleeding and constipation.  Endocrine: Negative for polydipsia.  Genitourinary:  Negative for dysuria and frequency.  Musculoskeletal:        See HPI  Skin:  Negative for rash.  Neurological:  Negative for tremors, light-headedness and numbness.  Hematological:  Does not bruise/bleed easily.  Psychiatric/Behavioral:  Negative for agitation and behavioral problems.     Objective:  BP 135/84 (BP Location: Left Arm, Patient Position: Sitting, Cuff Size:  Normal)   Pulse 93   Temp 98.1 F (36.7 C)   Resp 18   Ht 5' 6"  (1.676 m)   Wt 216 lb (98 kg)   LMP 08/20/2011   SpO2 97%   BMI 34.86 kg/m      03/29/2022   10:19 AM 02/01/2022    1:47 PM 12/21/2021    9:51 AM  BP/Weight  Systolic BP 147 829 562  Diastolic BP 84 86 80  Wt. (Lbs) 216 212 217  BMI 34.86 kg/m2 34.22 kg/m2 35.02 kg/m2      Physical Exam Constitutional:      Appearance: She is well-developed.  Cardiovascular:     Rate and Rhythm: Normal  rate.     Heart sounds: Normal heart sounds. No murmur heard. Pulmonary:     Effort: Pulmonary effort is normal.     Breath sounds: Normal breath sounds. No wheezing or rales.  Chest:     Chest wall: No tenderness.  Abdominal:     General: Bowel sounds are normal. There is no distension.     Palpations: Abdomen is soft. There is no mass.     Tenderness: There is no abdominal tenderness.  Musculoskeletal:        General: Normal range of motion.     Right lower leg: No edema.     Left lower leg: No edema.  Neurological:     Mental Status: She is alert and oriented to person, place, and time.  Psychiatric:        Mood and Affect: Mood normal.        Latest Ref Rng & Units 12/21/2021   10:46 AM 11/06/2021    4:09 AM 11/05/2021    4:24 AM  CMP  Glucose 70 - 99 mg/dL 146  146  138   BUN 6 - 24 mg/dL 12  11  13    Creatinine 0.57 - 1.00 mg/dL 0.73  0.67  0.80   Sodium 134 - 144 mmol/L 143  138  133   Potassium 3.5 - 5.2 mmol/L 4.1  3.4  3.8   Chloride 96 - 106 mmol/L 106  106  104   CO2 20 - 29 mmol/L 22  25  22    Calcium 8.7 - 10.2 mg/dL 10.1  8.7  8.3     Lipid Panel     Component Value Date/Time   CHOL 136 11/05/2021 0424   CHOL 144 02/11/2020 0915   TRIG 168 (H) 11/05/2021 0424   HDL 31 (L) 11/05/2021 0424   HDL 40 02/11/2020 0915   CHOLHDL 4.4 11/05/2021 0424   VLDL 34 11/05/2021 0424   LDLCALC 71 11/05/2021 0424   LDLCALC 67 02/11/2020 0915    CBC    Component Value Date/Time   WBC 10.3 11/06/2021  0409   RBC 4.58 11/06/2021 0409   HGB 10.0 (L) 11/06/2021 0409   HGB 13.0 01/31/2020 0953   HCT 31.3 (L) 11/06/2021 0409   HCT 43.0 01/31/2020 0953   PLT 225 11/06/2021 0409   PLT 318 01/31/2020 0953   MCV 68.3 (L) 11/06/2021 0409   MCV 71 (L) 01/31/2020 0953   MCH 21.8 (L) 11/06/2021 0409   MCHC 31.9 11/06/2021 0409   RDW 14.5 11/06/2021 0409   RDW 17.0 (H) 01/31/2020 0953   LYMPHSABS 2.7 10/28/2021 1028   LYMPHSABS 2.8 01/31/2020 0953   MONOABS 0.5 10/28/2021 1028   EOSABS 0.2 10/28/2021 1028   EOSABS 0.3 01/31/2020 0953   BASOSABS 0.1 10/28/2021 1028   BASOSABS 0.0 01/31/2020 0953    Lab Results  Component Value Date   HGBA1C 6.5 (A) 03/29/2022    Assessment & Plan:  1. Type 2 diabetes mellitus with other specified complication, without long-term current use of insulin (HCC) Controlled with A1c of 6.5 Increase Ozempic dose due to desire for additional weight loss Counseled on Diabetic diet, my plate method, 654 minutes of moderate intensity exercise/week Blood sugar logs with fasting goals of 80-120 mg/dl, random of less than 180 and in the event of sugars less than 60 mg/dl or greater than 400 mg/dl encouraged to notify the clinic. Advised on the need for annual eye exams, annual foot exams, Pneumonia vaccine. - POCT  glucose (manual entry) - POCT glycosylated hemoglobin (Hb A1C) - Semaglutide,0.25 or 0.5MG/DOS, (OZEMPIC, 0.25 OR 0.5 MG/DOSE,) 2 MG/1.5ML SOPN; Inject 0.5 mg into the skin once a week.  Dispense: 2 mL; Refill: 6  2. Myalgia She does have myalgia but might also have some symptoms of neuropathy Advised to hold off on statin for 1 week to see if symptoms improve We will initiate gabapentin and naproxen If symptoms do not resolve advised to refer to clinic.  Consider sciatica as possible differential including referring for PT - CK - Aldolase - gabapentin (NEURONTIN) 300 MG capsule; Take 1 capsule (300 mg total) by mouth at bedtime.  Dispense: 30 capsule;  Refill: 3 - naproxen (NAPROSYN) 500 MG tablet; Take 1 tablet (500 mg total) by mouth 2 (two) times daily with a meal.  Dispense: 60 tablet; Refill: 0  3. Essential hypertension Controlled Continue antihypertensive Counseled on blood pressure goal of less than 130/80, low-sodium, DASH diet, medication compliance, 150 minutes of moderate intensity exercise per week. Discussed medication compliance, adverse effects.  4. Smoking greater than 20 pack years Smoking cessation support: smoking cessation hotline: 1-800-QUIT-NOW.  Smoking cessation classes are available through [facility] and [facility]. Call [phone] or visit our website at [email or web address]  Spent 3 minutes counseling on dangers of tobacco use and benefits of quitting, offered pharmacological intervention to aid quitting and patient is ready to quit.  - buPROPion (WELLBUTRIN XL) 150 MG 24 hr tablet; Take 1 tablet (150 mg total) by mouth daily. For smoking cessation  Dispense: 90 tablet; Refill: 1  5. Encounter for PPD test - TB Skin Test  6. Need for shingles vaccine - Varicella-zoster vaccine IM   Meds ordered this encounter  Medications   Semaglutide,0.25 or 0.5MG/DOS, (OZEMPIC, 0.25 OR 0.5 MG/DOSE,) 2 MG/1.5ML SOPN    Sig: Inject 0.5 mg into the skin once a week.    Dispense:  2 mL    Refill:  6    Dose increase   gabapentin (NEURONTIN) 300 MG capsule    Sig: Take 1 capsule (300 mg total) by mouth at bedtime.    Dispense:  30 capsule    Refill:  3   naproxen (NAPROSYN) 500 MG tablet    Sig: Take 1 tablet (500 mg total) by mouth 2 (two) times daily with a meal.    Dispense:  60 tablet    Refill:  0   buPROPion (WELLBUTRIN XL) 150 MG 24 hr tablet    Sig: Take 1 tablet (150 mg total) by mouth daily. For smoking cessation    Dispense:  90 tablet    Refill:  1    Follow-up: Return in about 3 months (around 06/29/2022) for Chronic medical conditions, 2nd dose shingrix.       Charlott Rakes,  MD, FAAFP. [facility] and [facility] [place], [place]   03/29/2022, 12:36 PM

## 2022-03-29 NOTE — Patient Instructions (Signed)
Muscle Pain, Adult Muscle pain, also called myalgia, is a condition in which a person has pain in one or more muscles in the body. Muscle pain may be mild, moderate, or severe. It may feel sharp, achy, or burning. In most cases, the pain lasts only a short time and goes away without treatment. Muscle pain can result from using muscles in a new or different way or after a period of inactivity. It is normal to feel some muscle pain after starting an exercise program. Muscles that have not been used often will be sore at first. What are the causes? This condition is caused by using muscles in a new or different way after a period of inactivity. Other causes may include: Overuse or muscle strain, especially if you are not in shape. This is the most common cause of muscle pain. Injury or bruising. Infectious diseases, including diseases caused by viruses, such as the flu (influenza). Fibromyalgia.This is a long-term, or chronic, condition that causes muscle tenderness, tiredness (fatigue), and headache. Autoimmune or rheumatologic diseases. These are conditions, such as lupus, in which the body's defense system (immunesystem) attacks areas in the body. Certain medicines, including ACE inhibitors and statins. What are the signs or symptoms? The main symptom of this condition is sore or painful muscles, including during activity and when stretching. You may also have slight swelling. How is this diagnosed? This condition is diagnosed with a physical exam. Your health care provider will ask questions about your pain and when it began. If you have not had muscle pain for very long, your health care provider may want to wait before doing much testing. If your muscle pain has lasted a long time, tests may be done right away. In some cases, this may include tests to rule out certain conditions or illnesses. How is this treated? Treatment for this condition depends on the cause. Home care is often enough to  relieve muscle pain. Your health care provider may also prescribe NSAIDs, such as ibuprofen. Follow these instructions at home: Medicines Take over-the-counter and prescription medicines only as told by your health care provider. Ask your health care provider if the medicine prescribed to you requires you to avoid driving or using machinery. Managing pain, swelling, and discomfort     If directed, put ice on the painful area. To do this: Put ice in a plastic bag. Place a towel between your skin and the bag. Leave the ice on for 20 minutes, 2-3 times a day. For the first 2 days of muscle soreness, or if there is swelling: Do not soak in hot baths. Do not use a hot tub, steam room, sauna, heating pad, or other heat source. After 48-72 hours, you may alternate between applying ice and applying heat as told by your health care provider. If directed, apply heat to the affected area as often as told by your health care provider. Use the heat source that your health care provider recommends, such as a moist heat pack or a heating pad. Place a towel between your skin and the heat source. Leave the heat on for 20-30 minutes. Remove the heat if your skin turns bright red. This is especially important if you are unable to feel pain, heat, or cold. You may have a greater risk of getting burned. If you have an injury, raise (elevate) the injured area above the level of your heart while you are sitting or lying down. Activity  If overuse is causing your muscle pain: Slow down  your activities until the pain goes away. Do regular, gentle exercises if you are not usually active. Warm up before exercising. Stretch before and after exercising. This can help lower the risk of muscle pain. Do not continue working out if the pain is severe. Severe pain could mean that you have injured a muscle. Do not lift anything that is heavier than 5-10 lb (2.3-4.5 kg), or the limit that you are told, until your health  care provider says that it is safe. Return to your normal activities as told by your health care provider. Ask your health care provider what activities are safe for you. General instructions Do not use any products that contain nicotine or tobacco, such as cigarettes, e-cigarettes, and chewing tobacco. These can delay healing. If you need help quitting, ask your health care provider. Keep all follow-up visits as told by your health care provider. This is important. Contact a health care provider if you have: Muscle pain that gets worse and medicines do not help. Muscle pain that lasts longer than 3 days. A rash or fever along with muscle pain. Muscle pain after a tick bite. Muscle pain while working out, even though you are in good physical condition. Redness, soreness, or swelling along with muscle pain. Muscle pain after starting a new medicine or changing the dose of a medicine. Get help right away if you have: Trouble breathing. Trouble swallowing. Muscle pain along with a stiff neck, fever, and vomiting. Severe muscle weakness or you cannot move part of your body. These symptoms may represent a serious problem that is an emergency. Do not wait to see if the symptoms will go away. Get medical help right away. Call your local emergency services (911 in the [place] Do not drive yourself to the hospital. Summary Muscle pain usually lasts only a short time and goes away without treatment. This condition is caused by using muscles in a new or different way after a period of inactivity. If your muscle pain lasts longer than 3 days, tell your health care provider. This information is not intended to replace advice given to you by your health care provider. Make sure you discuss any questions you have with your health care provider. Document Revised: 02/27/2021 Document Reviewed: 02/27/2021 Elsevier Patient Education  [place].

## 2022-03-31 LAB — TB SKIN TEST
Induration: 0 mm
TB Skin Test: NEGATIVE

## 2022-04-12 ENCOUNTER — Other Ambulatory Visit: Payer: 59

## 2022-04-25 ENCOUNTER — Other Ambulatory Visit: Payer: Self-pay | Admitting: Family Medicine

## 2022-04-25 DIAGNOSIS — M791 Myalgia, unspecified site: Secondary | ICD-10-CM

## 2022-04-27 NOTE — Telephone Encounter (Signed)
Requested Prescriptions  Pending Prescriptions Disp Refills  . naproxen (NAPROSYN) 500 MG tablet [Pharmacy Med Name: NAPROXEN 500MG TABLETS] 60 tablet 0    Sig: TAKE 1 TABLET(500 MG) BY MOUTH TWICE DAILY WITH A MEAL     Analgesics:  NSAIDS Failed - 04/25/2022  3:19 AM      Failed - Manual Review: Labs are only required if the patient has taken medication for more than 8 weeks.      Failed - HGB in normal range and within 360 days    Hemoglobin  Date Value Ref Range Status  11/06/2021 10.0 (L) 12.0 - 15.0 g/dL Final  01/31/2020 13.0 11.1 - 15.9 g/dL Final         Failed - HCT in normal range and within 360 days    HCT  Date Value Ref Range Status  11/06/2021 31.3 (L) 36.0 - 46.0 % Final   Hematocrit  Date Value Ref Range Status  01/31/2020 43.0 34.0 - 46.6 % Final         Passed - Cr in normal range and within 360 days    Creatinine, Ser  Date Value Ref Range Status  12/21/2021 0.73 0.57 - 1.00 mg/dL Final         Passed - PLT in normal range and within 360 days    Platelets  Date Value Ref Range Status  11/06/2021 225 150 - 400 K/uL Final  01/31/2020 318 150 - 450 x10E3/uL Final         Passed - eGFR is 30 or above and within 360 days    GFR calc Af Amer  Date Value Ref Range Status  04/07/2020 >60 >60 mL/min Final   GFR, Estimated  Date Value Ref Range Status  11/06/2021 >60 >60 mL/min Final    Comment:    (NOTE) Calculated using the CKD-EPI Creatinine Equation (2021)    GFR  Date Value Ref Range Status  05/24/2017 109.79 >60.00 mL/min Final   eGFR  Date Value Ref Range Status  12/21/2021 100 >59 mL/min/1.73 Final         Passed - Patient is not pregnant      Passed - Valid encounter within last 12 months    Recent Outpatient Visits          4 weeks ago Type 2 diabetes mellitus with other specified complication, without long-term current use of insulin ([place])   [place], Enobong, MD   2 months ago Encounter for  well woman exam with routine gynecological exam   [place], Enobong, MD   4 months ago Encounter for medication review   [place], Annie Main L, RPH-CPP   4 months ago Type 2 diabetes mellitus with other specified complication, without long-term current use of insulin ([place])   [place], Charlane Ferretti, MD   8 months ago Crohn's disease of large intestine with abscess [facility])   [facility] Charlott Rakes, MD

## 2022-05-10 ENCOUNTER — Ambulatory Visit: Payer: 59 | Admitting: Family Medicine

## 2022-05-17 ENCOUNTER — Ambulatory Visit: Payer: 59 | Admitting: Internal Medicine

## 2022-05-24 ENCOUNTER — Encounter (HOSPITAL_COMMUNITY): Payer: Self-pay | Admitting: *Deleted

## 2022-05-24 ENCOUNTER — Other Ambulatory Visit: Payer: Self-pay

## 2022-05-24 ENCOUNTER — Ambulatory Visit (HOSPITAL_COMMUNITY)
Admission: EM | Admit: 2022-05-24 | Discharge: 2022-05-24 | Disposition: A | Discharge status: Home / Self Care | Payer: 59 | Attending: Emergency Medicine | Admitting: Emergency Medicine

## 2022-05-24 DIAGNOSIS — M5431 Sciatica, right side: Secondary | ICD-10-CM

## 2022-05-24 LAB — CBG MONITORING, ED: Glucose-Capillary: 107 mg/dL — ABNORMAL HIGH (ref 70–99)

## 2022-05-24 MED ORDER — METHYLPREDNISOLONE SODIUM SUCC 125 MG IJ SOLR
INTRAMUSCULAR | Status: AC
  Filled 2022-05-24: qty 2

## 2022-05-24 MED ORDER — METHYLPREDNISOLONE SODIUM SUCC 125 MG IJ SOLR
60.0000 mg | Freq: Once | INTRAMUSCULAR | Status: AC
  Administered 2022-05-24: 60 mg via INTRAMUSCULAR

## 2022-05-24 NOTE — Discharge Instructions (Signed)
Please follow up with orthopedics as soon as you are able. You may need further imaging such as MRI to evaluate your symptoms.

## 2022-05-24 NOTE — ED Provider Notes (Signed)
[facility]    CSN: [id number] Arrival date & time: 05/24/22  1516      History   Chief Complaint Chief Complaint  Patient presents with   pain radiating pain Rt leg    HPI Amanda Garner is a 51 y.o. female.  Presents with 63-monthhistory of low back pain radiating into the right leg.  Reports it goes all the way down to her ankle.  Began after a surgery in March.  Reports recent worsening. Today 10/10 pain. Sometimes feels in the left leg too. Has been taking naproxen twice daily, gabapentin twice daily.  Prescribed as daily but she has doubled her doses. Reports tylenol doesn't work.  History of diabetes, does not check her blood sugars at home  Past Medical History:  Diagnosis Date   Acid reflux    Blood transfusion    Diverticulitis    Hypertension    Sickle cell trait ([facility]     Patient Active Problem List   Diagnosis Date Noted   Type 2 diabetes mellitus ([facility] 11/05/2021   GERD (gastroesophageal reflux disease) 11/05/2021   S/P laparoscopic-assisted sigmoidectomy 11/04/2021   Abnormal CT scan 08/05/2021   Hx of diverticulitis of colon 08/05/2021   Colovaginal fistula 07/03/2021   Acquired palmar and plantar hyperkeratosis 01/25/2021   Hyperglycemia 02/01/2020   Cro[place]'s disease of large intestine with abscess ([facility] 01/31/2020   Acute diverticulitis 01/09/2020   Diverticulitis of large intestine with abscess without bleeding 06/07/2017   Abscess of sigmoid colon due to diverticulitis 05/24/2017   Tobacco dependence 05/11/2017   Essential hypertension 05/11/2017   Reflux esophagitis 05/11/2017   Tinea pedis of left foot 04/11/2015   C. difficile colitis 08/26/2011   Enteritis 08/25/2011    Past Surgical History:  Procedure Laterality Date   ABDOMINAL HYSTERECTOMY  2001   partial hysterectomy - emergency during last C-section   CESAREAN SECTION  1991; 1997; 2001   CD[place]kinN/A 11/04/2021   Procedure:  FLEXIBLE SIGMOIDOSCOPY;  Surgeon: WIleana Roup MD;  Location: [facility];  Service: General;  Laterality: N/A;   XI ROBOTIC ASSISTED LOWER ANTERIOR RESECTION N/A 11/04/2021   Procedure: XI ROBOTIC ASSISTED LOWER ANTERIOR RESECTION, RESECTION OF COLOVAGINAL FISTULA, BILATERAL TAP BLOCK, INJECTION OF FIREFLY;  Surgeon: WIleana Roup MD;  Location: [facility];  Service: General;  Laterality: N/A;    OB History   No obstetric history on file.      Home Medications    Prior to Admission medications   Medication Sig Start Date End Date Taking? Authorizing Provider  Accu-Chek FastClix Lancets MISC Use as directed to check blood glucose once a day before breakfast. 12/21/21   NCharlott Rakes MD  amLODipine (NORVASC) 10 MG tablet Take 1 tablet (10 mg total) by mouth daily. 12/21/21 12/21/22  NCharlott Rakes MD  atorvastatin (LIPITOR) 20 MG tablet Take 1 tablet (20 mg total) by mouth daily. 12/21/21   NCharlott Rakes MD  Blood Glucose Monitoring Suppl (BLOOD GLUCOSE MONITOR SYSTEM) w/Device KIT Use as directed to check blood glucose once a day before breakfast. 11/06/21   NMariel Aloe MD  buPROPion (WELLBUTRIN XL) 150 MG 24 hr tablet Take 1 tablet (150 mg total) by mouth daily. For smoking cessation 03/29/22   NCharlott Rakes MD  gabapentin (NEURONTIN) 300 MG capsule Take 1 capsule (300 mg total) by mouth at bedtime. 03/29/22   NCharlott Rakes MD  glucose blood test strip Use to test blood glucose daily  before breakfast. 11/06/21   Mariel Aloe, MD  naproxen (NAPROSYN) 500 MG tablet TAKE 1 TABLET(500 MG) BY MOUTH TWICE DAILY WITH A MEAL 04/27/22   Charlott Rakes, MD  pantoprazole (PROTONIX) 40 MG tablet Take 1 tablet (40 mg total) by mouth daily. 12/21/21 06/19/22  Charlott Rakes, MD  Semaglutide,0.25 or 0.5MG/DOS, (OZEMPIC, 0.25 OR 0.5 MG/DOSE,) 2 MG/1.5ML SOPN Inject 0.5 mg into the skin once a week. 03/29/22   Charlott Rakes, MD  terbinafine (LAMISIL) 250 MG tablet Take 1 tablet (250 mg total)  by mouth daily. Patient not taking: Reported on 03/29/2022 08/19/21   Wallene Huh, DPM    Family History Family History  Problem Relation Age of Onset   Hypertension Mother    Diabetes Mother    Diabetes type II Mother    Diabetes type II Father    Stroke Father    Lung cancer Father 73   Esophageal cancer Maternal Grandfather    Colon cancer Neg Hx    Pancreatic cancer Neg Hx    Stomach cancer Neg Hx     Social History Social History   Tobacco Use   Smoking status: Every Day    Packs/day: 0.50    Years: 31.00    Total pack years: 15.50    Types: Cigarettes   Smokeless tobacco: Never  Vaping Use   Vaping Use: Never used  Substance Use Topics   Alcohol use: Yes    Alcohol/week: 1.0 standard drink of alcohol    Types: 1 Standard drinks or equivalent per week    Comment: occassionally   Drug use: Yes    Frequency: 7.0 times per week    Types: Marijuana     Allergies   Patient has no known allergies.   Review of Systems Review of Systems Per HPI  Physical Exam Triage Vital Signs ED Triage Vitals  Enc Vitals Group     BP 05/24/22 1614 106/85     Pulse Rate 05/24/22 1614 97     Resp 05/24/22 1614 20     Temp 05/24/22 1614 98.6 F (37 C)     Temp src --      SpO2 05/24/22 1614 98 %     Weight --      Height --      Head Circumference --      Peak Flow --      Pain Score 05/24/22 1612 10     Pain Loc --      Pain Edu? --      Excl. in [place]? --    No data found.  Updated Vital Signs BP 106/85   Pulse 97   Temp 98.6 F (37 C)   Resp 20   LMP 08/20/2011   SpO2 98%     Physical Exam Vitals and nursing note reviewed.  Constitutional:      General: She is not in acute distress. HENT:     Mouth/Throat:     Pharynx: Oropharynx is clear.  Cardiovascular:     Rate and Rhythm: Normal rate and regular rhythm.     Pulses: Normal pulses.     Heart sounds: Normal heart sounds.  Pulmonary:     Effort: Pulmonary effort is normal.     Breath  sounds: Normal breath sounds.  Musculoskeletal:     Lumbar back: Tenderness present. Positive right straight leg raise test.     Comments: Lumbar paraspinals tender on right. No spinal tenderness  Neurological:  Mental Status: She is alert and oriented to person, place, and time.     UC Treatments / Results  Labs (all labs ordered are listed, but only abnormal results are displayed) Labs Reviewed  CBG MONITORING, ED - Abnormal; Notable for the following components:      Result Value   Glucose-Capillary 107 (*)    All other components within normal limits    EKG  Radiology No results found.  Procedures Procedures   Medications Ordered in UC Medications  methylPREDNISolone sodium succinate (SOLU-MEDROL) 125 mg/2 mL injection 60 mg (60 mg Intramuscular Given 05/24/22 1701)    Initial Impression / Assessment and Plan / UC Course  I have reviewed the triage vital signs and the nursing notes.  Pertinent labs & imaging results that were available during my care of the patient were reviewed by me and considered in my medical decision making (see chart for details).  No red flag symptoms with back pain. Likely sciatica.  CBG 107 IM solumedrol given, reports better. Down to 7/10  Recommend close follow up with ortho, likely needs further imaging given duration of symptoms without relief of symptoms. Provided [facility] ortho info. Work note given. Return precautions discussed. Patient agrees to plan  Final Clinical Impressions(s) / UC Diagnoses   Final diagnoses:  Sciatica, right side     Discharge Instructions      Please follow up with orthopedics as soon as you are able. You may need further imaging such as MRI to evaluate your symptoms.      ED Prescriptions   None    PDMP not reviewed this encounter.   Evoleth Nordmeyer, [place] Gu[place], [place] 05/24/22 1736

## 2022-05-24 NOTE — ED Triage Notes (Signed)
PT reports she has pain that radiates down Rt leg from buttocks. Pain present since surgery in March . PCP instructed Pt to stop Cholesterol med and started pt on Gabapentn . Pt has increased the dose to twice a day. Pt is also taking Naproxen . Pt has also increased the dose of Naproxen .

## 2022-05-28 ENCOUNTER — Other Ambulatory Visit: Payer: Self-pay | Admitting: Family Medicine

## 2022-05-28 DIAGNOSIS — M791 Myalgia, unspecified site: Secondary | ICD-10-CM

## 2022-05-28 NOTE — Telephone Encounter (Signed)
Patient called, left VM to return the call to the office to scheduled an appt for medication refill request.   

## 2022-05-28 NOTE — Telephone Encounter (Signed)
Courtesy refill.  Requested Prescriptions  Pending Prescriptions Disp Refills  . naproxen (NAPROSYN) 500 MG tablet [Pharmacy Med Name: NAPROXEN 500MG TABLETS] 60 tablet 0    Sig: TAKE 1 TABLET(500 MG) BY MOUTH TWICE DAILY WITH A MEAL     Analgesics:  NSAIDS Failed - 05/28/2022  3:21 AM      Failed - Manual Review: Labs are only required if the patient has taken medication for more than 8 weeks.      Failed - HGB in normal range and within 360 days    Hemoglobin  Date Value Ref Range Status  11/06/2021 10.0 (L) 12.0 - 15.0 g/dL Final  01/31/2020 13.0 11.1 - 15.9 g/dL Final         Failed - HCT in normal range and within 360 days    HCT  Date Value Ref Range Status  11/06/2021 31.3 (L) 36.0 - 46.0 % Final   Hematocrit  Date Value Ref Range Status  01/31/2020 43.0 34.0 - 46.6 % Final         Passed - Cr in normal range and within 360 days    Creatinine, Ser  Date Value Ref Range Status  12/21/2021 0.73 0.57 - 1.00 mg/dL Final         Passed - PLT in normal range and within 360 days    Platelets  Date Value Ref Range Status  11/06/2021 225 150 - 400 K/uL Final  01/31/2020 318 150 - 450 x10E3/uL Final         Passed - eGFR is 30 or above and within 360 days    GFR calc Af Amer  Date Value Ref Range Status  04/07/2020 >60 >60 mL/min Final   GFR, Estimated  Date Value Ref Range Status  11/06/2021 >60 >60 mL/min Final    Comment:    (NOTE) Calculated using the CKD-EPI Creatinine Equation (2021)    GFR  Date Value Ref Range Status  05/24/2017 109.79 >60.00 mL/min Final   eGFR  Date Value Ref Range Status  12/21/2021 100 >59 mL/min/1.73 Final         Passed - Patient is not pregnant      Passed - Valid encounter within last 12 months    Recent Outpatient Visits          2 months ago Type 2 diabetes mellitus with other specified complication, without long-term current use of insulin ([place])   [place] Charlott Rakes, MD   3  months ago Encounter for well woman exam with routine gynecological exam   [place], Enobong, MD   5 months ago Encounter for medication review   [place], Annie Main L, RPH-CPP   5 months ago Type 2 diabetes mellitus with other specified complication, without long-term current use of insulin ([place])   [place], Charlane Ferretti, MD   9 months ago Crohn's disease of large intestine with abscess [facility])   [facility] Charlott Rakes, MD

## 2022-06-24 ENCOUNTER — Other Ambulatory Visit: Payer: Self-pay | Admitting: Family Medicine

## 2022-06-24 DIAGNOSIS — M791 Myalgia, unspecified site: Secondary | ICD-10-CM

## 2022-08-02 ENCOUNTER — Encounter: Payer: Self-pay | Admitting: Nurse Practitioner

## 2022-08-02 ENCOUNTER — Ambulatory Visit: Payer: 59 | Attending: Nurse Practitioner | Admitting: Nurse Practitioner

## 2022-08-02 VITALS — BP 163/99 | HR 94 | Wt 213.2 lb

## 2022-08-02 DIAGNOSIS — I10 Essential (primary) hypertension: Secondary | ICD-10-CM | POA: Diagnosis not present

## 2022-08-02 DIAGNOSIS — Z23 Encounter for immunization: Secondary | ICD-10-CM | POA: Diagnosis not present

## 2022-08-02 DIAGNOSIS — E1169 Type 2 diabetes mellitus with other specified complication: Secondary | ICD-10-CM

## 2022-08-02 MED ORDER — SEMAGLUTIDE (1 MG/DOSE) 4 MG/3ML ~~LOC~~ SOPN: 1.0000 mg | PEN_INJECTOR | SUBCUTANEOUS | 1 refills | Status: DC

## 2022-08-02 MED ORDER — AMLODIPINE BESYLATE 10 MG PO TABS: 10.0000 mg | ORAL_TABLET | Freq: Every day | ORAL | 1 refills | Status: DC

## 2022-08-02 NOTE — Progress Notes (Signed)
 Assessment & Plan:  Amanda Garner was seen today for medication problem and diabetes.  Diagnoses and all orders for this visit:  Type 2 diabetes mellitus with other specified complication, without long-term current use of insulin (HCC) -     Semaglutide, 1 MG/DOSE, 4 MG/3ML SOPN; Inject 1 mg as directed once a week. Continue blood sugar control as discussed in office today, low carbohydrate diet, and regular physical exercise as tolerated, 150 minutes per week (30 min each day, 5 days per week, or 50 min 3 days per week). Keep blood sugar logs with fasting goal of 90-130 mg/dl, post prandial (after you eat) less than 180.  For Hypoglycemia: BS <60 and Hyperglycemia BS >400; contact the clinic ASAP. Annual eye exams and foot exams are recommended.   Need for Tdap vaccination -     Tdap vaccine greater than or equal to 7yo IM  Need for shingles vaccine -     Varicella-zoster vaccine IM  Essential hypertension -     amLODipine (NORVASC) 10 MG tablet; Take 1 tablet (10 mg total) by mouth daily. Continue all antihypertensives as prescribed.  Reminded to bring in blood pressure log for follow  up appointment.  RECOMMENDATIONS: DASH/Mediterranean Diets are healthier choices for HTN.      Patient has been counseled on age-appropriate routine health concerns for screening and prevention. These are reviewed and up-to-date. Referrals have been placed accordingly. Immunizations are up-to-date or declined.    Subjective:   Chief Complaint  Patient presents with   Medication Problem    Not losing weight with current dose.   Diabetes   HPI Amanda Garner 51 y.o. female presents to office today with medication concerns.  She has a past medical history of Acid reflux, DM 2,  Diverticulitis, Hypertension, and Sickle cell trait   DM States she does no believe her ozempic is working. Still having cravings, blood sugars are not consistently at goal and weight has been going up and down over the  past several months. Currently prescribed ozempic 0.5mg weekly. Will increase to 1 mg weekly today.  Lab Results  Component Value Date   HGBA1C 6.5 (A) 03/29/2022   Lab Results  Component Value Date   LDLCALC 71 11/05/2021    Wt Readings from Last 3 Encounters:  08/02/22 213 lb 3.2 oz (96.7 kg)  03/29/22 216 lb (98 kg)  02/01/22 212 lb (96.2 kg)       HTN States blood pressures at home are running high however she does not have any recent readings for review today. Will have her monitor her blood pressure this week and return for blood pressure check on Monday. Will continue amlodipine 10 mg daily for now.  BP Readings from Last 3 Encounters:  08/02/22 (!) 163/99  05/24/22 106/85  03/29/22 135/84     Review of Systems  Constitutional:  Negative for fever, malaise/fatigue and weight loss.  HENT: Negative.  Negative for nosebleeds.   Eyes: Negative.  Negative for blurred vision, double vision and photophobia.  Respiratory: Negative.  Negative for cough and shortness of breath.   Cardiovascular: Negative.  Negative for chest pain, palpitations and leg swelling.  Gastrointestinal: Negative.  Negative for heartburn, nausea and vomiting.  Musculoskeletal: Negative.  Negative for myalgias.  Neurological: Negative.  Negative for dizziness, focal weakness, seizures and headaches.  Psychiatric/Behavioral: Negative.  Negative for suicidal ideas.     Past Medical History:  Diagnosis Date   Acid reflux    Blood transfusion      Diverticulitis    Hypertension    Sickle cell trait [facility])     Past Surgical History:  Procedure Laterality Date   ABDOMINAL HYSTERECTOMY  2001   partial hysterectomy - emergency during last C-section   [place]; 1997; 2001   [place] N/A 11/04/2021   Procedure: FLEXIBLE SIGMOIDOSCOPY;  Surgeon: Ileana Roup, MD;  Location: [facility];  Service: General;  Laterality: N/A;   XI ROBOTIC ASSISTED LOWER  ANTERIOR RESECTION N/A 11/04/2021   Procedure: XI ROBOTIC ASSISTED LOWER ANTERIOR RESECTION, RESECTION OF COLOVAGINAL FISTULA, BILATERAL TAP BLOCK, INJECTION OF FIREFLY;  Surgeon: Ileana Roup, MD;  Location: [facility];  Service: General;  Laterality: N/A;    Family History  Problem Relation Age of Onset   Hypertension Mother    Diabetes Mother    Diabetes type II Mother    Diabetes type II Father    Stroke Father    Lung cancer Father 34   Esophageal cancer Maternal Grandfather    Colon cancer Neg Hx    Pancreatic cancer Neg Hx    Stomach cancer Neg Hx     Social History Reviewed with no changes to be made today.   Outpatient Medications Prior to Visit  Medication Sig Dispense Refill   Accu-Chek FastClix Lancets MISC Use as directed to check blood glucose once a day before breakfast. 100 each 1   atorvastatin (LIPITOR) 20 MG tablet Take 1 tablet (20 mg total) by mouth daily. (Patient not taking: Reported on 08/02/2022) 90 tablet 1   Blood Glucose Monitoring Suppl (BLOOD GLUCOSE MONITOR SYSTEM) w/Device KIT Use as directed to check blood glucose once a day before breakfast. 1 kit 0   buPROPion (WELLBUTRIN XL) 150 MG 24 hr tablet Take 1 tablet (150 mg total) by mouth daily. For smoking cessation 90 tablet 1   gabapentin (NEURONTIN) 300 MG capsule Take 1 capsule (300 mg total) by mouth at bedtime. 30 capsule 3   glucose blood test strip Use to test blood glucose daily before breakfast. 100 each 0   naproxen (NAPROSYN) 500 MG tablet TAKE 1 TABLET(500 MG) BY MOUTH TWICE DAILY WITH A MEAL 60 tablet 0   pantoprazole (PROTONIX) 40 MG tablet Take 1 tablet (40 mg total) by mouth daily. 90 tablet 1   terbinafine (LAMISIL) 250 MG tablet Take 1 tablet (250 mg total) by mouth daily. (Patient not taking: Reported on 03/29/2022) 60 tablet 0   amLODipine (NORVASC) 10 MG tablet Take 1 tablet (10 mg total) by mouth daily. 90 tablet 1   Semaglutide,0.25 or 0.5MG/DOS, (OZEMPIC, 0.25 OR 0.5 MG/DOSE,) 2  MG/1.5ML SOPN Inject 0.5 mg into the skin once a week. 2 mL 6   No facility-administered medications prior to visit.    No Known Allergies     Objective:    BP (!) 163/99   Pulse 94   Wt 213 lb 3.2 oz (96.7 kg)   LMP 08/20/2011   SpO2 97%   BMI 34.41 kg/m  Wt Readings from Last 3 Encounters:  08/02/22 213 lb 3.2 oz (96.7 kg)  03/29/22 216 lb (98 kg)  02/01/22 212 lb (96.2 kg)    Physical Exam Vitals and nursing note reviewed.  Constitutional:      Appearance: She is well-developed.  HENT:     Head: Normocephalic and atraumatic.  Cardiovascular:     Rate and Rhythm: Normal rate and regular rhythm.     Heart sounds: Normal heart  sounds. No murmur heard.    No friction rub. No gallop.  Pulmonary:     Effort: Pulmonary effort is normal. No tachypnea or respiratory distress.     Breath sounds: Normal breath sounds. No decreased breath sounds, wheezing, rhonchi or rales.  Chest:     Chest wall: No tenderness.  Abdominal:     General: Bowel sounds are normal.     Palpations: Abdomen is soft.  Musculoskeletal:        General: Normal range of motion.     Cervical back: Normal range of motion.  Skin:    General: Skin is warm and dry.  Neurological:     Mental Status: She is alert and oriented to person, place, and time.     Coordination: Coordination normal.  Psychiatric:        Behavior: Behavior normal. Behavior is cooperative.        Thought Content: Thought content normal.        Judgment: Judgment normal.          Patient has been counseled extensively about nutrition and exercise as well as the importance of adherence with medications and regular follow-up. The patient was given clear instructions to go to ER or return to medical center if symptoms don't improve, worsen or new problems develop. The patient verbalized understanding.   Follow-up: Return for nurse visit next monday for HTN. Flu vaccine on monday in 3-4 weeks. See [place] in midfebruary a1c..     W , FNP-BC [facility] [place], [place] [phone]   08/02/2022, 9:04 PM 

## 2022-08-09 ENCOUNTER — Ambulatory Visit: Payer: 59

## 2022-08-27 ENCOUNTER — Other Ambulatory Visit (HOSPITAL_COMMUNITY): Payer: Self-pay

## 2022-08-30 ENCOUNTER — Encounter (HOSPITAL_COMMUNITY): Payer: Self-pay | Admitting: Emergency Medicine

## 2022-08-30 ENCOUNTER — Other Ambulatory Visit: Payer: Self-pay

## 2022-08-30 ENCOUNTER — Ambulatory Visit: Payer: 59

## 2022-08-30 ENCOUNTER — Ambulatory Visit (HOSPITAL_COMMUNITY)
Admission: EM | Admit: 2022-08-30 | Discharge: 2022-08-30 | Disposition: A | Discharge status: Home / Self Care | Payer: 59 | Attending: Family Medicine | Admitting: Family Medicine

## 2022-08-30 ENCOUNTER — Ambulatory Visit (INDEPENDENT_AMBULATORY_CARE_PROVIDER_SITE_OTHER): Payer: 59

## 2022-08-30 DIAGNOSIS — J069 Acute upper respiratory infection, unspecified: Secondary | ICD-10-CM | POA: Diagnosis not present

## 2022-08-30 DIAGNOSIS — J0100 Acute maxillary sinusitis, unspecified: Secondary | ICD-10-CM | POA: Diagnosis not present

## 2022-08-30 MED ORDER — ALBUTEROL SULFATE HFA 108 (90 BASE) MCG/ACT IN AERS: 2.0000 | INHALATION_SPRAY | RESPIRATORY_TRACT | 0 refills | Status: DC | PRN

## 2022-08-30 MED ORDER — BENZONATATE 200 MG PO CAPS: 200.0000 mg | ORAL_CAPSULE | Freq: Three times a day (TID) | ORAL | 0 refills | Status: DC

## 2022-08-30 MED ORDER — AMOXICILLIN-POT CLAVULANATE 875-125 MG PO TABS: 1.0000 | ORAL_TABLET | Freq: Two times a day (BID) | ORAL | 0 refills | Status: DC

## 2022-08-30 NOTE — ED Triage Notes (Signed)
Cough started 3 weeks ago.  Has used nyquil, delsym and is not seeing any improvement.  Patient reports headache and reports runny nose started yesterday.  Cough is non-productive.  Feels like phlegm gets stuck in chest

## 2022-08-30 NOTE — ED Provider Notes (Signed)
[place]    CSN: [id number] Arrival date & time: 08/30/22  1020      History   Chief Complaint Chief Complaint  Patient presents with   Cough    HPI Amanda Garner is a 52 y.o. female.   Patient is here for URI symptoms.  She started with flu like symptoms after the shingles vaccine 5 weeks ago.  She then started with cough x 3 weeks.  She has been taking otc meds without much help.  She feels like there is a much of mucous in her chest that she cannot get out.  Some wheezing, sob, headache.  Some chills, no fevers.  Runny nose x 2 days.  Sinus pressure x 2 days as well.  After coughing she does get vomiting.  No h/o ashtma.  Smokes 4 cigs/day.        Past Medical History:  Diagnosis Date   Acid reflux    Blood transfusion    Diverticulitis    Hypertension    Sickle cell trait [facility])     Patient Active Problem List   Diagnosis Date Noted   Type 2 diabetes mellitus ([place]) 11/05/2021   GERD (gastroesophageal reflux disease) 11/05/2021   S/P laparoscopic-assisted sigmoidectomy 11/04/2021   Abnormal CT scan 08/05/2021   Hx of diverticulitis of colon 08/05/2021   Colovaginal fistula 07/03/2021   Acquired palmar and plantar hyperkeratosis 01/25/2021   Hyperglycemia 02/01/2020   Crohn's disease of large intestine with abscess ([place]) 01/31/2020   Acute diverticulitis 01/09/2020   Diverticulitis of large intestine with abscess without bleeding 06/07/2017   Abscess of sigmoid colon due to diverticulitis 05/24/2017   Tobacco dependence 05/11/2017   Essential hypertension 05/11/2017   Reflux esophagitis 05/11/2017   Tinea pedis of left foot 04/11/2015   C. difficile colitis 08/26/2011   Enteritis 08/25/2011    Past Surgical History:  Procedure Laterality Date   ABDOMINAL HYSTERECTOMY  2001   partial hysterectomy - emergency during last C-section   CESAREAN SECTION  1991; 1997; 2001   [place] N/A 11/04/2021    Procedure: FLEXIBLE SIGMOIDOSCOPY;  Surgeon: Ileana Roup, MD;  Location: [facility];  Service: General;  Laterality: N/A;   XI ROBOTIC ASSISTED LOWER ANTERIOR RESECTION N/A 11/04/2021   Procedure: XI ROBOTIC ASSISTED LOWER ANTERIOR RESECTION, RESECTION OF COLOVAGINAL FISTULA, BILATERAL TAP BLOCK, INJECTION OF FIREFLY;  Surgeon: Ileana Roup, MD;  Location: [facility];  Service: General;  Laterality: N/A;    OB History   No obstetric history on file.      Home Medications    Prior to Admission medications   Medication Sig Start Date End Date Taking? Authorizing Provider  Accu-Chek FastClix Lancets MISC Use as directed to check blood glucose once a day before breakfast. 12/21/21   Charlott Rakes, MD  amLODipine (NORVASC) 10 MG tablet Take 1 tablet (10 mg total) by mouth daily. 08/02/22 08/02/23  Gildardo Pounds, NP  atorvastatin (LIPITOR) 20 MG tablet Take 1 tablet (20 mg total) by mouth daily. Patient not taking: Reported on 08/02/2022 12/21/21   Charlott Rakes, MD  Blood Glucose Monitoring Suppl (BLOOD GLUCOSE MONITOR SYSTEM) w/Device KIT Use as directed to check blood glucose once a day before breakfast. 11/06/21   Mariel Aloe, MD  buPROPion (WELLBUTRIN XL) 150 MG 24 hr tablet Take 1 tablet (150 mg total) by mouth daily. For smoking cessation Patient not taking: Reported on 08/30/2022 03/29/22   Charlott Rakes, MD  gabapentin (NEURONTIN) 300 MG capsule Take 1 capsule (300 mg total) by mouth at bedtime. 03/29/22   Charlott Rakes, MD  glucose blood test strip Use to test blood glucose daily before breakfast. 11/06/21   Mariel Aloe, MD  naproxen (NAPROSYN) 500 MG tablet TAKE 1 TABLET(500 MG) BY MOUTH TWICE DAILY WITH A MEAL 06/24/22   Charlott Rakes, MD  pantoprazole (PROTONIX) 40 MG tablet Take 1 tablet (40 mg total) by mouth daily. 12/21/21 06/19/22  Charlott Rakes, MD  Semaglutide, 1 MG/DOSE, 4 MG/3ML SOPN Inject 1 mg as directed once a week. 08/02/22   Gildardo Pounds, NP   terbinafine (LAMISIL) 250 MG tablet Take 1 tablet (250 mg total) by mouth daily. Patient not taking: Reported on 03/29/2022 08/19/21   Wallene Huh, DPM    Family History Family History  Problem Relation Age of Onset   Hypertension Mother    Diabetes Mother    Diabetes type II Mother    Diabetes type II Father    Stroke Father    Lung cancer Father 11   Esophageal cancer Maternal Grandfather    Colon cancer Neg Hx    Pancreatic cancer Neg Hx    Stomach cancer Neg Hx     Social History Social History   Tobacco Use   Smoking status: Some Days    Packs/day: 0.50    Years: 31.00    Total pack years: 15.50    Types: Cigarettes   Smokeless tobacco: Never  Vaping Use   Vaping Use: Never used  Substance Use Topics   Alcohol use: Yes    Alcohol/week: 1.0 standard drink of alcohol    Types: 1 Standard drinks or equivalent per week    Comment: occassionally   Drug use: Yes    Frequency: 7.0 times per week    Types: Marijuana     Allergies   Patient has no known allergies.   Review of Systems Review of Systems  Constitutional:  Positive for chills. Negative for fever.  HENT:  Positive for congestion, rhinorrhea and sinus pressure.   Respiratory:  Positive for cough, shortness of breath and wheezing.   Cardiovascular: Negative.   Gastrointestinal:  Positive for vomiting.  Musculoskeletal: Negative.   Skin: Negative.   Psychiatric/Behavioral: Negative.       Physical Exam Triage Vital Signs ED Triage Vitals  Enc Vitals Group     BP 08/30/22 1242 (!) 146/100     Pulse Rate 08/30/22 1242 89     Resp 08/30/22 1242 20     Temp 08/30/22 1242 98.1 F (36.7 C)     Temp Source 08/30/22 1242 Oral     SpO2 08/30/22 1242 98 %     Weight --      Height --      Head Circumference --      Peak Flow --      Pain Score 08/30/22 1239 8     Pain Loc --      Pain Edu? --      Excl. in [place]? --    No data found.  Updated Vital Signs BP (!) 146/100 (BP Location: Right  Arm) Comment: takes blood pressure medicine at night Comment (BP Location): large cuff  Pulse 89   Temp 98.1 F (36.7 C) (Oral)   Resp 20   LMP 08/20/2011   SpO2 98%   Visual Acuity Right Eye Distance:   Left Eye Distance:   Bilateral Distance:    Right Eye  Near:   Left Eye Near:    Bilateral Near:     Physical Exam Constitutional:      General: She is not in acute distress.    Appearance: Normal appearance. She is not ill-appearing.  HENT:     Nose: Congestion and rhinorrhea present.     Right Sinus: Maxillary sinus tenderness present.     Left Sinus: Maxillary sinus tenderness and frontal sinus tenderness present.  Cardiovascular:     Rate and Rhythm: Normal rate and regular rhythm.  Pulmonary:     Effort: Pulmonary effort is normal.     Comments: Slight wheezing noted Musculoskeletal:     Cervical back: Normal range of motion and neck supple. No tenderness.  Lymphadenopathy:     Cervical: No cervical adenopathy.  Skin:    General: Skin is warm.  Neurological:     General: No focal deficit present.     Mental Status: She is alert.  Psychiatric:        Mood and Affect: Mood normal.      UC Treatments / Results  Labs (all labs ordered are listed, but only abnormal results are displayed) Labs Reviewed - No data to display  EKG   Radiology DG Chest 2 View  Result Date: 08/30/2022 CLINICAL DATA:  Cough, shortness of breath. EXAM: CHEST - 2 VIEW COMPARISON:  April 02, 2021. FINDINGS: The heart size and mediastinal contours are within normal limits. Both lungs are clear. The visualized skeletal structures are unremarkable. IMPRESSION: No active cardiopulmonary disease. Electronically Signed   By: Marijo Conception M.D.   On: 08/30/2022 13:19    Procedures Procedures (including critical care time)  Medications Ordered in UC Medications - No data to display  Initial Impression / Assessment and Plan / UC Course  I have reviewed the triage vital signs and the  nursing notes.  Pertinent labs & imaging results that were available during my care of the patient were reviewed by me and considered in my medical decision making (see chart for details).    Final Clinical Impressions(s) / UC Diagnoses   Final diagnoses:  Upper respiratory tract infection, unspecified type  Acute non-recurrent maxillary sinusitis     Discharge Instructions      You were seen today for upper respiratory symptoms.  Your chest xray was normal.  I am treating you today with an antibiotic to take twice/day x 10 days.  I have also sent out a cough medication and inhaler to help with wheezing and shortness of breath.  You may also use over the counter zyrtec or claritin to help with runny nose, congestion.  Follow up if not improving over the next week or so.     ED Prescriptions     Medication Sig Dispense Auth. Provider   amoxicillin-clavulanate (AUGMENTIN) 875-125 MG tablet Take 1 tablet by mouth every 12 (twelve) hours. 14 tablet Johnesha Acheampong, MD   albuterol (VENTOLIN HFA) 108 (90 Base) MCG/ACT inhaler Inhale 2 puffs into the lungs every 4 (four) hours as needed for wheezing or shortness of breath. 1 each Zelma Snead, MD   benzonatate (TESSALON) 200 MG capsule Take 1 capsule (200 mg total) by mouth 3 (three) times daily. 21 capsule Rondel Oh, MD      PDMP not reviewed this encounter.   Rondel Oh, MD 08/30/22 1327

## 2022-08-30 NOTE — Discharge Instructions (Addendum)
You were seen today for upper respiratory symptoms.  Your chest xray was normal.  I am treating you today with an antibiotic to take twice/day x 10 days.  I have also sent out a cough medication and inhaler to help with wheezing and shortness of breath.  You may also use over the counter zyrtec or claritin to help with runny nose, congestion.  Follow up if not improving over the next week or so.

## 2022-10-12 ENCOUNTER — Other Ambulatory Visit: Payer: Self-pay

## 2022-10-12 NOTE — Progress Notes (Signed)
Patient outreached by Levi Aland, PharmD Candidate on 10/11/22 to discuss hypertension   Patient has an automated home blood pressure machine. They report home readings 130's-140's/80's. Pt reports BP as being relatively controlled at home. States that at last appointment it was high and they thought of increasing it after retaking a measure but pt states that it was never scheduled to retake her BP.    Medication review was performed. They are taking medications as prescribed.   The following barriers to adherence were noted:  - They do not have cost concerns. Does report that her only cost concern would be the Ozempic. States it is currently a $25 copay but worries that it may increase in the future. Discussed assistance programs and what some of the requirements to qualify were.  - They do not have transportation concerns.  - They do not need assistance obtaining refills.  - They do not occasionally forget to take some of their prescribed medications.  - They do not feel like one/some of their medications make them feel poorly.  - They do have questions or concerns about their medications. Patient is concerned that the Ozempic is not working for them. Upon questioning why pt states that she is not losing as much weight as she thought she should be. Pt reports that she has a BG monitor at home but it did not work/she didn't know how to use it. Pt is unsure if she kept it as she thought it wasn't working. Advised her that if she does have it to bring it with her to the appointment so they can walk her through how to use it. If she does not have it, advised her to ask her PCP to write another rx for her and have them walk her through how to use it. Pt was unsure of what brand of BG monitor it was prior but stated that insurance had covered it originally.  - They do have follow up scheduled with their primary care provider/cardiologist.   The following interventions were completed:  - Medications  were reviewed  - Patient was educated on goal blood pressures and long term health implications of elevated blood pressure  - Patient was educated on proper technique to check home blood pressure and reminded to bring home machine and readings to next provider appointment  - Patient was educated on medications, including indication and administration   The patient has follow up scheduled: 10/21/22  PCP: Angelena Sole, PharmD Candidate, Class of 2024  [phone]  [place], [place].D. PGY-2 Ambulatory Care Pharmacy Resident 10/12/2022 7:24 AM

## 2022-10-18 ENCOUNTER — Ambulatory Visit: Payer: Self-pay | Admitting: Family Medicine

## 2022-10-21 ENCOUNTER — Ambulatory Visit: Payer: 59 | Attending: Family Medicine | Admitting: Physician Assistant

## 2022-10-21 ENCOUNTER — Encounter: Payer: Self-pay | Admitting: Physician Assistant

## 2022-10-21 VITALS — BP 115/82 | HR 113 | Ht 66.0 in | Wt 207.4 lb

## 2022-10-21 DIAGNOSIS — J069 Acute upper respiratory infection, unspecified: Secondary | ICD-10-CM

## 2022-10-21 DIAGNOSIS — K219 Gastro-esophageal reflux disease without esophagitis: Secondary | ICD-10-CM

## 2022-10-21 DIAGNOSIS — J9801 Acute bronchospasm: Secondary | ICD-10-CM

## 2022-10-21 DIAGNOSIS — E1169 Type 2 diabetes mellitus with other specified complication: Secondary | ICD-10-CM | POA: Diagnosis not present

## 2022-10-21 DIAGNOSIS — F17210 Nicotine dependence, cigarettes, uncomplicated: Secondary | ICD-10-CM | POA: Diagnosis not present

## 2022-10-21 LAB — POCT GLYCOSYLATED HEMOGLOBIN (HGB A1C): HbA1c, POC (controlled diabetic range): 5.8 % (ref 0.0–7.0)

## 2022-10-21 LAB — GLUCOSE, POCT (MANUAL RESULT ENTRY): POC Glucose: 128 mg/dl — AB (ref 70–99)

## 2022-10-21 MED ORDER — BENZONATATE 200 MG PO CAPS: 200.0000 mg | ORAL_CAPSULE | Freq: Three times a day (TID) | ORAL | 0 refills | Status: DC

## 2022-10-21 MED ORDER — ALBUTEROL SULFATE HFA 108 (90 BASE) MCG/ACT IN AERS: 2.0000 | INHALATION_SPRAY | RESPIRATORY_TRACT | 0 refills | Status: DC | PRN

## 2022-10-21 MED ORDER — AZITHROMYCIN 250 MG PO TABS: ORAL_TABLET | ORAL | 0 refills | Status: DC

## 2022-10-21 MED ORDER — SEMAGLUTIDE (1 MG/DOSE) 4 MG/3ML ~~LOC~~ SOPN: 1.0000 mg | PEN_INJECTOR | SUBCUTANEOUS | 3 refills | Status: DC

## 2022-10-21 MED ORDER — PANTOPRAZOLE SODIUM 40 MG PO TBEC: 40.0000 mg | DELAYED_RELEASE_TABLET | Freq: Every day | ORAL | 1 refills | Status: DC

## 2022-10-21 MED ORDER — PREDNISONE 10 MG PO TABS: ORAL_TABLET | ORAL | 0 refills | Status: DC

## 2022-10-21 MED ORDER — BUPROPION HCL ER (XL) 150 MG PO TB24: 150.0000 mg | ORAL_TABLET | Freq: Every day | ORAL | 1 refills | Status: DC

## 2022-10-21 NOTE — Addendum Note (Signed)
Addended by: Argentina Donovan on: 10/21/2022 11:34 AM   Modules accepted: Orders

## 2022-10-21 NOTE — Progress Notes (Signed)
Patient ID: Amanda Garner, female   DOB: 03-20-1971, 52 y.o.   MRN: [id number]   Amanda Garner, is a 52 y.o. female  [id number]  [id number]  DOB - 02-04-1971  Chief Complaint  Patient presents with   Cough   Diabetes   Medication Refill       Subjective:   Amanda Garner is a 52 y.o. female here today for 4 week h/o cough and congestion that is getting worse.  No fever.  She is wheezing.  Mucus is thick and dark.  Needs RF on inhaler.  She has tolerated prednisone in the past.    No problems updated.  ALLERGIES: No Known Allergies  PAST MEDICAL HISTORY: Past Medical History:  Diagnosis Date   Acid reflux    Blood transfusion    Diverticulitis    Hypertension    Sickle cell trait ([place])     MEDICATIONS AT HOME: Prior to Admission medications   Medication Sig Start Date End Date Taking? Authorizing Provider  amLODipine (NORVASC) 10 MG tablet Take 1 tablet (10 mg total) by mouth daily. 08/02/22 08/02/23 Yes Gildardo Pounds, NP  azithromycin (ZITHROMAX) 250 MG tablet Take 2 tablets on day 1, then 1 tablet daily on days 2 through 5 10/21/22 10/26/22 Yes Helem Reesor, Dionne Bucy, PA-C  gabapentin (NEURONTIN) 300 MG capsule Take 1 capsule (300 mg total) by mouth at bedtime. 03/29/22  Yes Newlin, Charlane Ferretti, MD  naproxen (NAPROSYN) 500 MG tablet TAKE 1 TABLET(500 MG) BY MOUTH TWICE DAILY WITH A MEAL 06/24/22  Yes Newlin, Enobong, MD  predniSONE (DELTASONE) 10 MG tablet 6,5,4,3,2,1 take each days dose at once in am with food. 10/21/22  Yes Freeman Caldron M, PA-C  Accu-Chek FastClix Lancets MISC Use as directed to check blood glucose once a day before breakfast. Patient not taking: Reported on 10/21/2022 12/21/21   Charlott Rakes, MD  albuterol (VENTOLIN HFA) 108 (90 Base) MCG/ACT inhaler Inhale 2 puffs into the lungs every 4 (four) hours as needed for wheezing or shortness of breath. 10/21/22   Argentina Donovan, PA-C  benzonatate (TESSALON) 200 MG capsule Take 1 capsule (200 mg total) by  mouth 3 (three) times daily. 10/21/22   Argentina Donovan, PA-C  Blood Glucose Monitoring Suppl (BLOOD GLUCOSE MONITOR SYSTEM) w/Device KIT Use as directed to check blood glucose once a day before breakfast. Patient not taking: Reported on 10/21/2022 11/06/21   Mariel Aloe, MD  buPROPion (WELLBUTRIN XL) 150 MG 24 hr tablet Take 1 tablet (150 mg total) by mouth daily. For smoking cessation 10/21/22   Freeman Caldron M, PA-C  glucose blood test strip Use to test blood glucose daily before breakfast. Patient not taking: Reported on 10/21/2022 11/06/21   Mariel Aloe, MD  pantoprazole (PROTONIX) 40 MG tablet Take 1 tablet (40 mg total) by mouth daily. 10/21/22 04/19/23  Argentina Donovan, PA-C  Semaglutide, 1 MG/DOSE, 4 MG/3ML SOPN Inject 1 mg as directed once a week. 10/21/22   Argentina Donovan, PA-C  terbinafine (LAMISIL) 250 MG tablet Take 1 tablet (250 mg total) by mouth daily. Patient not taking: Reported on 03/29/2022 08/19/21   Wallene Huh, DPM    ROS: Neg cardiac Neg GI Neg GU Neg MS Neg psych Neg neuro  Objective:   Vitals:   10/21/22 1021  BP: 115/82  Pulse: (!) 113  SpO2: 94%  Weight: 207 lb 6.4 oz (94.1 kg)  Height: '5\' 6"'$  (1.676 m)   Exam General appearance : Awake, alert, not in  any distress. Speech Clear. Not toxic looking HEENT: Atraumatic and Normocephalic Neck: Supple, no JVD. No cervical lymphadenopathy.  Chest: fair air entry bilaterally, CTAB.  No rales/rhonchi.  There is wheezing throughout.  No respiratory distress CVS: S1 S2 regular, no murmurs. Rate 92bpm on exam Extremities: B/L Lower Ext shows no edema, both legs are warm to touch Neurology: Awake alert, and oriented X 3, CN II-XII intact, Non focal Skin: No Rash  Data Review Lab Results  Component Value Date   HGBA1C 5.8 10/21/2022   HGBA1C 6.5 (A) 03/29/2022   HGBA1C 6.6 (H) 10/28/2021    Assessment & Plan   1. Type 2 diabetes mellitus with other specified complication, without long-term  current use of insulin (HCC) Much improved-continue ozempic - Glucose (CBG) - HgB A1c - Semaglutide, 1 MG/DOSE, 4 MG/3ML SOPN; Inject 1 mg as directed once a week.  Dispense: 3 mL; Refill: 3  2. Smoking greater than 20 pack years She has cut back - buPROPion (WELLBUTRIN XL) 150 MG 24 hr tablet; Take 1 tablet (150 mg total) by mouth daily. For smoking cessation  Dispense: 90 tablet; Refill: 1  3. Gastroesophageal reflux disease without esophagitis - pantoprazole (PROTONIX) 40 MG tablet; Take 1 tablet (40 mg total) by mouth daily.  Dispense: 90 tablet; Refill: 1  4. Upper respiratory tract infection, unspecified type Will cover for atypicals sue to length of illness  - albuterol (VENTOLIN HFA) 108 (90 Base) MCG/ACT inhaler; Inhale 2 puffs into the lungs every 4 (four) hours as needed for wheezing or shortness of breath.  Dispense: 1 each; Refill: 0 - predniSONE (DELTASONE) 10 MG tablet; 6,5,4,3,2,1 take each days dose at once in am with food.  Dispense: 21 tablet; Refill: 0 - benzonatate (TESSALON) 200 MG capsule; Take 1 capsule (200 mg total) by mouth 3 (three) times daily.  Dispense: 40 capsule; Refill: 0 - azithromycin (ZITHROMAX) 250 MG tablet; Take 2 tablets on day 1, then 1 tablet daily on days 2 through 5  Dispense: 6 tablet; Refill: 0  5. Bronchospasm - albuterol (VENTOLIN HFA) 108 (90 Base) MCG/ACT inhaler; Inhale 2 puffs into the lungs every 4 (four) hours as needed for wheezing or shortness of breath.  Dispense: 1 each; Refill: 0 - predniSONE (DELTASONE) 10 MG tablet; 6,5,4,3,2,1 take each days dose at once in am with food.  Dispense: 21 tablet; Refill: 0    Return in about 4 months (around 02/19/2023) for [facility]/Newlin for chronic conditions.  The patient was given clear instructions to go to ER or return to medical center if symptoms don't improve, worsen or new problems develop. The patient verbalized understanding. The patient was told to call to get lab results if they haven't  heard anything in the next week.      Freeman Caldron, PA-C [facility] and [facility], [place]   10/21/2022, 10:56 AM

## 2022-10-22 ENCOUNTER — Other Ambulatory Visit: Payer: Self-pay | Admitting: *Deleted

## 2022-10-22 ENCOUNTER — Telehealth: Payer: Self-pay | Admitting: Family Medicine

## 2022-10-22 DIAGNOSIS — K219 Gastro-esophageal reflux disease without esophagitis: Secondary | ICD-10-CM

## 2022-10-22 DIAGNOSIS — F17210 Nicotine dependence, cigarettes, uncomplicated: Secondary | ICD-10-CM

## 2022-10-22 DIAGNOSIS — E1169 Type 2 diabetes mellitus with other specified complication: Secondary | ICD-10-CM

## 2022-10-22 DIAGNOSIS — J069 Acute upper respiratory infection, unspecified: Secondary | ICD-10-CM

## 2022-10-22 DIAGNOSIS — J9801 Acute bronchospasm: Secondary | ICD-10-CM

## 2022-10-22 MED ORDER — BUPROPION HCL ER (XL) 150 MG PO TB24: 150.0000 mg | ORAL_TABLET | Freq: Every day | ORAL | 1 refills | Status: DC

## 2022-10-22 MED ORDER — ALBUTEROL SULFATE HFA 108 (90 BASE) MCG/ACT IN AERS: 2.0000 | INHALATION_SPRAY | RESPIRATORY_TRACT | 0 refills | Status: DC | PRN

## 2022-10-22 MED ORDER — BENZONATATE 200 MG PO CAPS: 200.0000 mg | ORAL_CAPSULE | Freq: Three times a day (TID) | ORAL | 0 refills | Status: DC

## 2022-10-22 MED ORDER — AZITHROMYCIN 250 MG PO TABS: ORAL_TABLET | ORAL | 0 refills | Status: AC

## 2022-10-22 MED ORDER — PREDNISONE 10 MG PO TABS: ORAL_TABLET | ORAL | 0 refills | Status: DC

## 2022-10-22 MED ORDER — SEMAGLUTIDE (1 MG/DOSE) 4 MG/3ML ~~LOC~~ SOPN: 1.0000 mg | PEN_INJECTOR | SUBCUTANEOUS | 3 refills | Status: DC

## 2022-10-22 MED ORDER — PANTOPRAZOLE SODIUM 40 MG PO TBEC: 40.0000 mg | DELAYED_RELEASE_TABLET | Freq: Every day | ORAL | 1 refills | Status: DC

## 2022-10-22 NOTE — Telephone Encounter (Signed)
Pt stated she had an appt yesterday and her prescriptions were sent to [facility] but they are in the process of moving. Pt requests that the prescriptions be sent to [facility] [place] Phone: [phone]  Fax: [phone]

## 2022-10-22 NOTE — Telephone Encounter (Signed)
Rx sent as requested.

## 2023-01-31 ENCOUNTER — Ambulatory Visit (INDEPENDENT_AMBULATORY_CARE_PROVIDER_SITE_OTHER): Payer: 59 | Admitting: Podiatry

## 2023-01-31 ENCOUNTER — Encounter: Payer: Self-pay | Admitting: Podiatry

## 2023-01-31 DIAGNOSIS — L309 Dermatitis, unspecified: Secondary | ICD-10-CM

## 2023-01-31 DIAGNOSIS — M21619 Bunion of unspecified foot: Secondary | ICD-10-CM | POA: Diagnosis not present

## 2023-01-31 NOTE — Progress Notes (Signed)
Subjective:   Patient ID: Amanda Garner, female   DOB: 52 y.o.   MRN: [id number]   HPI Patient presents stating that the bottom of both her feet have become aggravated and she has dry skin.  Patient does not remember what happened it has been going on recently   ROS      Objective:  Physical Exam  Neurovascular status intact structural bunion still noted history of colon surgery with large patches plantar aspect both arches with dry tissue formation     Assessment:  Skin pathology that is difficult to understand with [place] having tried oral antifungals in the past for this     Plan:  Reviewed with patient also discussed bunion deformity and at this point for the dry skin I have recommended seeing a dermatologist and just starting off using creams.  Patient will be seen back to recheck as needed if symptoms persist or when she is ready to get bunion correction left over right

## 2023-02-16 ENCOUNTER — Other Ambulatory Visit: Payer: Self-pay | Admitting: Physician Assistant

## 2023-02-16 DIAGNOSIS — E1169 Type 2 diabetes mellitus with other specified complication: Secondary | ICD-10-CM

## 2023-02-21 ENCOUNTER — Ambulatory Visit: Payer: 59 | Admitting: Family Medicine

## 2023-04-19 ENCOUNTER — Telehealth: Payer: Self-pay | Admitting: Family Medicine

## 2023-04-19 NOTE — Telephone Encounter (Signed)
The patient called in stating her Semaglutide, 1 MG/DOSE, (OZEMPIC, 1 MG/DOSE,) 4 MG/3ML SOPN is not working. She states it is not suppressing her appetite. She states she is always hungry and it is not seeming to help. She wants to up the dosage or something. Please assist patient further

## 2023-04-20 MED ORDER — SEMAGLUTIDE (2 MG/DOSE) 8 MG/3ML ~~LOC~~ SOPN: 2.0000 mg | PEN_INJECTOR | SUBCUTANEOUS | 1 refills | Status: DC

## 2023-04-20 NOTE — Telephone Encounter (Signed)
I have sent a Rx for an increased dose of Ozempic 2mg  to her pharmacy.

## 2023-04-20 NOTE — Addendum Note (Signed)
Addended by: Hoy Register on: 04/20/2023 10:49 AM   Modules accepted: Orders

## 2023-04-20 NOTE — Telephone Encounter (Signed)
Call placed to patient unable to reach message left on VM. Call to advise Rx for an increased dose of Ozempic 2mg  to her pharmacy.

## 2023-04-21 ENCOUNTER — Ambulatory Visit: Payer: 59 | Admitting: Family Medicine

## 2023-05-05 ENCOUNTER — Ambulatory Visit: Payer: 59 | Admitting: Physician Assistant

## 2023-05-26 ENCOUNTER — Other Ambulatory Visit: Payer: Self-pay

## 2023-05-26 ENCOUNTER — Ambulatory Visit: Payer: 59 | Attending: Family Medicine | Admitting: Family Medicine

## 2023-05-26 ENCOUNTER — Encounter: Payer: Self-pay | Admitting: Family Medicine

## 2023-05-26 VITALS — BP 139/89 | HR 79 | Temp 98.4°F | Resp 16 | Wt 200.0 lb

## 2023-05-26 DIAGNOSIS — I10 Essential (primary) hypertension: Secondary | ICD-10-CM | POA: Diagnosis not present

## 2023-05-26 DIAGNOSIS — K219 Gastro-esophageal reflux disease without esophagitis: Secondary | ICD-10-CM | POA: Diagnosis not present

## 2023-05-26 DIAGNOSIS — Z7985 Long-term (current) use of injectable non-insulin antidiabetic drugs: Secondary | ICD-10-CM

## 2023-05-26 DIAGNOSIS — E1169 Type 2 diabetes mellitus with other specified complication: Secondary | ICD-10-CM

## 2023-05-26 DIAGNOSIS — F17210 Nicotine dependence, cigarettes, uncomplicated: Secondary | ICD-10-CM

## 2023-05-26 DIAGNOSIS — J9801 Acute bronchospasm: Secondary | ICD-10-CM

## 2023-05-26 DIAGNOSIS — R21 Rash and other nonspecific skin eruption: Secondary | ICD-10-CM

## 2023-05-26 LAB — POCT GLYCOSYLATED HEMOGLOBIN (HGB A1C): HbA1c, POC (prediabetic range): 5.7 % (ref 5.7–6.4)

## 2023-05-26 LAB — GLUCOSE, POCT (MANUAL RESULT ENTRY): POC Glucose: 124 mg/dL — AB (ref 70–99)

## 2023-05-26 MED ORDER — TIRZEPATIDE 15 MG/0.5ML ~~LOC~~ SOAJ
15.0000 mg | SUBCUTANEOUS | 6 refills | Status: DC
  Filled 2023-05-26: qty 6, 84d supply, fill #0
  Filled 2023-06-14 – 2023-07-11 (×3): qty 2, 28d supply, fill #0
  Filled 2023-08-03: qty 2, 28d supply, fill #1
  Filled 2023-08-31 – 2023-09-19 (×3): qty 2, 28d supply, fill #2
  Filled 2023-10-17 – 2023-10-31 (×2): qty 2, 28d supply, fill #3

## 2023-05-26 MED ORDER — FAMOTIDINE 40 MG PO TABS: 40.0000 mg | ORAL_TABLET | Freq: Every day | ORAL | 1 refills | Status: DC

## 2023-05-26 MED ORDER — NICOTINE 7 MG/24HR TD PT24
7.0000 mg | MEDICATED_PATCH | Freq: Every day | TRANSDERMAL | 3 refills | Status: DC
Start: 2023-05-26 — End: 2023-11-28

## 2023-05-26 MED ORDER — BUPROPION HCL ER (XL) 150 MG PO TB24
150.0000 mg | ORAL_TABLET | Freq: Every day | ORAL | 1 refills | Status: DC
Start: 2023-05-26 — End: 2023-10-04

## 2023-05-26 MED ORDER — ATORVASTATIN CALCIUM 20 MG PO TABS
20.0000 mg | ORAL_TABLET | Freq: Every day | ORAL | 1 refills | Status: DC
Start: 2023-05-26 — End: 2023-10-04

## 2023-05-26 MED ORDER — TIRZEPATIDE 12.5 MG/0.5ML ~~LOC~~ SOAJ
12.5000 mg | SUBCUTANEOUS | 0 refills | Status: DC
  Filled 2023-05-26 – 2023-06-13 (×2): qty 2, 28d supply, fill #0

## 2023-05-26 MED ORDER — ESOMEPRAZOLE MAGNESIUM 40 MG PO CPDR: 40.0000 mg | DELAYED_RELEASE_CAPSULE | Freq: Every day | ORAL | 1 refills | Status: DC

## 2023-05-26 MED ORDER — AMLODIPINE BESYLATE 10 MG PO TABS: 10.0000 mg | ORAL_TABLET | Freq: Every day | ORAL | 1 refills | Status: DC

## 2023-05-26 NOTE — Patient Instructions (Signed)

## 2023-05-26 NOTE — Progress Notes (Signed)
Discuss DM and Protonix Wants Rx for a libre or dexcom

## 2023-05-26 NOTE — Progress Notes (Signed)
Subjective:  Patient ID: Amanda Garner, female    DOB: Jul 22, 1971  Age: 52 y.o. MRN: [id number]  CC: Diabetes and Gastroesophageal Reflux   HPI Amanda Garner is a 52 y.o. year old female with a history of hypertension, GERD, Crohn's disease, diverticulitis (status post lower resection of colovaginal fistula in 10/2021 status post laparoscopic-assisted sigmoidectomy on 11/2021), type 2 diabetes mellitus (A1c 5.7), Nicotine dependence (since she was 20)   Interval History: Discussed the use of AI scribe software for clinical note transcription with the patient, who gave verbal consent to proceed.  She presents for a routine follow-up. She reports struggling with weight loss despite being on Ozempic, having only lost twenty pounds. She expresses interest in trying a new medication, Mounjaro, which is reported to have more weight loss benefits. Her diabetes is controlled with an A1c of 5.7.  She is up-to-date on annual eye exams.  The patient also reports persistent heartburn, which is not adequately controlled with Protonix. She reports needing to take three or four tablets at a time to alleviate symptoms, and even then, the relief is temporary. She requests a stronger medication for heartburn.  The patient's Crohn's disease has been stable since surgery, with no reported flare-ups. However, she has a persistent rash on her foot, which is itchy and feels like "fire ants." She has tried terbinafine for the rash, but it has not provided relief.  The patient also reports a history of smoking, currently smoking about six cigarettes a day. She has tried Wellbutrin to decrease the craving but reports it is not working. She expresses interest in trying nicotine patches or gum to help quit smoking.  The patient stopped taking atorvastatin due to muscle cramps but reports that the cramps have resolved.  She is unsure if this was also related to her sciatica.  She is open to restarting the medication.  She has also stopped taking gabapentin.        Past Medical History:  Diagnosis Date   Acid reflux    Blood transfusion    Diverticulitis    Hypertension    Sickle cell trait [facility])     Past Surgical History:  Procedure Laterality Date   ABDOMINAL HYSTERECTOMY  2001   partial hysterectomy - emergency during last C-section   CESAREAN SECTION  1991; 1997; 2001   CESAREAN SECTION     FLEXIBLE SIGMOIDOSCOPY N/A 11/04/2021   Procedure: FLEXIBLE SIGMOIDOSCOPY;  Surgeon: Andria Meuse, MD;  Location: [facility];  Service: General;  Laterality: N/A;   XI ROBOTIC ASSISTED LOWER ANTERIOR RESECTION N/A 11/04/2021   Procedure: XI ROBOTIC ASSISTED LOWER ANTERIOR RESECTION, RESECTION OF COLOVAGINAL FISTULA, BILATERAL TAP BLOCK, INJECTION OF FIREFLY;  Surgeon: Andria Meuse, MD;  Location: [facility];  Service: General;  Laterality: N/A;    Family History  Problem Relation Age of Onset   Hypertension Mother    Diabetes Mother    Diabetes type II Mother    Diabetes type II Father    Stroke Father    Lung cancer Father 52   Esophageal cancer Maternal Grandfather    Colon cancer Neg Hx    Pancreatic cancer Neg Hx    Stomach cancer Neg Hx     Social History   Socioeconomic History   Marital status: Married    Spouse name: Not on file   Number of children: 3   Years of education: Not on file   Highest education level: Not on file  Occupational History  Occupation: Brewing technologist: [employer]    Comment: Company secretary  Tobacco Use   Smoking status: Some Days    Current packs/day: 0.50    Average packs/day: 0.5 packs/day for 31.0 years (15.5 ttl pk-yrs)    Types: Cigarettes   Smokeless tobacco: Never  Vaping Use   Vaping status: Never Used  Substance and Sexual Activity   Alcohol use: Yes    Alcohol/week: 1.0 standard drink of alcohol    Types: 1 Standard drinks or equivalent per week    Comment: occassionally   Drug use: Yes     Frequency: 7.0 times per week    Types: Marijuana   Sexual activity: Yes    Partners: Male    Birth control/protection: Surgical  Other Topics Concern   Not on file  Social History Narrative   ** Merged History Encounter **       Social Determinants of Health   Financial Resource Strain: Not on file  Food Insecurity: Not on file  Transportation Needs: Not on file  Physical Activity: Not on file  Stress: Not on file  Social Connections: Not on file    No Known Allergies  Outpatient Medications Prior to Visit  Medication Sig Dispense Refill   Accu-Chek FastClix Lancets MISC Use as directed to check blood glucose once a day before breakfast. 100 each 1   albuterol (VENTOLIN HFA) 108 (90 Base) MCG/ACT inhaler Inhale 2 puffs into the lungs every 4 (four) hours as needed for wheezing or shortness of breath. 1 each 0   benzonatate (TESSALON) 200 MG capsule Take 1 capsule (200 mg total) by mouth 3 (three) times daily. 40 capsule 0   Blood Glucose Monitoring Suppl (BLOOD GLUCOSE MONITOR SYSTEM) w/Device KIT Use as directed to check blood glucose once a day before breakfast. 1 kit 0   gabapentin (NEURONTIN) 300 MG capsule Take 1 capsule (300 mg total) by mouth at bedtime. 30 capsule 3   glucose blood test strip Use to test blood glucose daily before breakfast. 100 each 0   naproxen (NAPROSYN) 500 MG tablet TAKE 1 TABLET(500 MG) BY MOUTH TWICE DAILY WITH A MEAL 60 tablet 0   predniSONE (DELTASONE) 10 MG tablet 6,5,4,3,2,1 take each days dose at once in am with food. 21 tablet 0   Semaglutide, 2 MG/DOSE, 8 MG/3ML SOPN Inject 2 mg as directed once a week. 3 mL 1   terbinafine (LAMISIL) 250 MG tablet Take 1 tablet (250 mg total) by mouth daily. 60 tablet 0   amLODipine (NORVASC) 10 MG tablet Take 1 tablet (10 mg total) by mouth daily. 90 tablet 1   buPROPion (WELLBUTRIN XL) 150 MG 24 hr tablet Take 1 tablet (150 mg total) by mouth daily. For smoking cessation 90 tablet 1   pantoprazole  (PROTONIX) 40 MG tablet Take 1 tablet (40 mg total) by mouth daily. 90 tablet 1   No facility-administered medications prior to visit.     ROS Review of Systems  Constitutional:  Negative for activity change and appetite change.  HENT:  Negative for sinus pressure and sore throat.   Respiratory:  Negative for chest tightness, shortness of breath and wheezing.   Cardiovascular:  Negative for chest pain and palpitations.  Gastrointestinal:  Negative for abdominal distention, abdominal pain and constipation.  Genitourinary: Negative.   Musculoskeletal: Negative.   Psychiatric/Behavioral:  Negative for behavioral problems and dysphoric mood.     Objective:  BP 139/89 (BP Location: Right  Arm, Patient Position: Sitting, Cuff Size: Normal)   Pulse 79   Temp 98.4 F (36.9 C) (Oral)   Resp 16   Wt 200 lb (90.7 kg)   LMP 08/20/2011   SpO2 98%   BMI 32.28 kg/m      05/26/2023   11:23 AM 10/21/2022   10:21 AM 08/30/2022   12:42 PM  BP/Weight  Systolic BP 139 115 146  Diastolic BP 89 82 100  Wt. (Lbs) 200 207.4   BMI 32.28 kg/m2 33.48 kg/m2       Physical Exam Constitutional:      Appearance: She is well-developed.  Cardiovascular:     Rate and Rhythm: Normal rate.     Heart sounds: Normal heart sounds. No murmur heard. Pulmonary:     Effort: Pulmonary effort is normal.     Breath sounds: Normal breath sounds. No wheezing or rales.  Chest:     Chest wall: No tenderness.  Abdominal:     General: Bowel sounds are normal. There is no distension.     Palpations: Abdomen is soft. There is no mass.     Tenderness: There is no abdominal tenderness.  Musculoskeletal:        General: Normal range of motion.     Right lower leg: No edema.     Left lower leg: No edema.  Neurological:     Mental Status: She is alert and oriented to person, place, and time.  Psychiatric:        Mood and Affect: Mood normal.        Latest Ref Rng & Units 12/21/2021   10:46 AM 11/06/2021    4:09  AM 11/05/2021    4:24 AM  CMP  Glucose 70 - 99 mg/dL 308  657  846   BUN 6 - 24 mg/dL 12  11  13    Creatinine 0.57 - 1.00 mg/dL 9.62  9.52  8.41   Sodium 134 - 144 mmol/L 143  138  133   Potassium 3.5 - 5.2 mmol/L 4.1  3.4  3.8   Chloride 96 - 106 mmol/L 106  106  104   CO2 20 - 29 mmol/L 22  25  22    Calcium 8.7 - 10.2 mg/dL 32.4  8.7  8.3     Lipid Panel     Component Value Date/Time   CHOL 136 11/05/2021 0424   CHOL 144 02/11/2020 0915   TRIG 168 (H) 11/05/2021 0424   HDL 31 (L) 11/05/2021 0424   HDL 40 02/11/2020 0915   CHOLHDL 4.4 11/05/2021 0424   VLDL 34 11/05/2021 0424   LDLCALC 71 11/05/2021 0424   LDLCALC 67 02/11/2020 0915    CBC    Component Value Date/Time   WBC 10.3 11/06/2021 0409   RBC 4.58 11/06/2021 0409   HGB 10.0 (L) 11/06/2021 0409   HGB 13.0 01/31/2020 0953   HCT 31.3 (L) 11/06/2021 0409   HCT 43.0 01/31/2020 0953   PLT 225 11/06/2021 0409   PLT 318 01/31/2020 0953   MCV 68.3 (L) 11/06/2021 0409   MCV 71 (L) 01/31/2020 0953   MCH 21.8 (L) 11/06/2021 0409   MCHC 31.9 11/06/2021 0409   RDW 14.5 11/06/2021 0409   RDW 17.0 (H) 01/31/2020 0953   LYMPHSABS 2.7 10/28/2021 1028   LYMPHSABS 2.8 01/31/2020 0953   MONOABS 0.5 10/28/2021 1028   EOSABS 0.2 10/28/2021 1028   EOSABS 0.3 01/31/2020 0953   BASOSABS 0.1 10/28/2021 1028   BASOSABS 0.0  01/31/2020 0953    Lab Results  Component Value Date   HGBA1C 5.7 05/26/2023    Assessment & Plan:  Assessment and Plan    Type 2 Diabetes Mellitus Well controlled with an A1c of 5.7. Patient desires additional weight loss. -Discontinue Ozempic. -Start Mounjaro 12.5mg  weekly for 4 weeks, then increase to 15mg  weekly. -Dispense from clinic pharmacy.  Gastroesophageal Reflux Disease Persistent symptoms despite high dose Protonix. -Change Protonix to Nexium in the morning. -Add Pepcid in the evening. -If symptoms persist, refer to Gastroenterology for further evaluation.  Hyperlipidemia Off statin  therapy since last year due to muscle cramps. -Restart Atorvastatin. -Check lipid panel at next visit.  Tobacco Use Disorder Patient continues to smoke 6 cigarettes per day. -Continue current medication for smoking cessation. -Consider adding nicotine gum.  Foot Dermatitis Persistent despite prior treatment with steroids and antifungal medication. -Refer to Dermatology for further evaluation.  General Health Maintenance -Continue Amlodipine for hypertension. -Check blood work at next visit. -Refer for eye exam due to worsening vision. -Next visit in 6 months.          Meds ordered this encounter  Medications   tirzepatide (MOUNJARO) 12.5 MG/0.5ML Pen    Sig: Inject 12.5 mg into the skin once a week. For 4 weeks then increase to 15mg     Dispense:  6 mL    Refill:  0    Discontinue Ozempic   tirzepatide (MOUNJARO) 15 MG/0.5ML Pen    Sig: Inject 15 mg into the skin once a week.    Dispense:  6 mL    Refill:  6   esomeprazole (NEXIUM) 40 MG capsule    Sig: Take 1 capsule (40 mg total) by mouth daily.    Dispense:  90 capsule    Refill:  1    Discontinue Protonix   famotidine (PEPCID) 40 MG tablet    Sig: Take 1 tablet (40 mg total) by mouth at bedtime.    Dispense:  90 tablet    Refill:  1   atorvastatin (LIPITOR) 20 MG tablet    Sig: Take 1 tablet (20 mg total) by mouth daily.    Dispense:  90 tablet    Refill:  1   amLODipine (NORVASC) 10 MG tablet    Sig: Take 1 tablet (10 mg total) by mouth daily.    Dispense:  90 tablet    Refill:  1   buPROPion (WELLBUTRIN XL) 150 MG 24 hr tablet    Sig: Take 1 tablet (150 mg total) by mouth daily. For smoking cessation    Dispense:  90 tablet    Refill:  1   nicotine (NICODERM CQ) 7 mg/24hr patch    Sig: Place 1 patch (7 mg total) onto the skin daily.    Dispense:  28 patch    Refill:  3    Follow-up: Return in about 6 months (around 11/24/2023) for Chronic medical conditions.       Hoy Register, MD, FAAFP. [facility] [place], [place] [phone]   05/26/2023, 1:41 PM

## 2023-05-27 LAB — CMP14+EGFR
ALT: 16 [IU]/L (ref 0–32)
AST: 20 [IU]/L (ref 0–40)
Albumin: 4.3 g/dL (ref 3.8–4.9)
Alkaline Phosphatase: 96 [IU]/L (ref 44–121)
BUN/Creatinine Ratio: 24 — ABNORMAL HIGH (ref 9–23)
BUN: 17 mg/dL (ref 6–24)
Bilirubin Total: <0.2 mg/dL (ref 0.0–1.2)
CO2: 22 mmol/L (ref 20–29)
Calcium: 9.9 mg/dL (ref 8.7–10.2)
Chloride: 107 mmol/L — ABNORMAL HIGH (ref 96–106)
Creatinine, Ser: 0.72 mg/dL (ref 0.57–1.00)
Globulin, Total: 2.2 g/dL (ref 1.5–4.5)
Glucose: 84 mg/dL (ref 70–99)
Potassium: 4.5 mmol/L (ref 3.5–5.2)
Sodium: 143 mmol/L (ref 134–144)
Total Protein: 6.5 g/dL (ref 6.0–8.5)
eGFR: 100 mL/min/{1.73_m2} (ref >59)

## 2023-06-07 ENCOUNTER — Other Ambulatory Visit: Payer: Self-pay

## 2023-06-13 ENCOUNTER — Other Ambulatory Visit: Payer: Self-pay

## 2023-06-14 ENCOUNTER — Other Ambulatory Visit: Payer: Self-pay

## 2023-06-23 ENCOUNTER — Other Ambulatory Visit: Payer: Self-pay

## 2023-06-23 ENCOUNTER — Other Ambulatory Visit (INDEPENDENT_AMBULATORY_CARE_PROVIDER_SITE_OTHER): Payer: 59

## 2023-06-23 ENCOUNTER — Ambulatory Visit: Payer: 59 | Admitting: Surgical

## 2023-06-23 DIAGNOSIS — M25511 Pain in right shoulder: Secondary | ICD-10-CM | POA: Diagnosis not present

## 2023-06-24 ENCOUNTER — Other Ambulatory Visit: Payer: Self-pay

## 2023-06-26 ENCOUNTER — Encounter: Payer: Self-pay | Admitting: Surgical

## 2023-06-26 NOTE — Progress Notes (Signed)
Office Visit Note   Patient: Amanda Garner           Date of Birth: April 10, 1971           MRN: [id number] Visit Date: 06/23/2023 Requested by: Hoy Register, MD [address] [place] [address] [place],  [place] [address] PCP: Hoy Register, MD  Subjective: Chief Complaint  Patient presents with   Other    Right shoulder/neck pain    HPI: Amanda Garner is a 52 y.o. female who presents to the office reporting right shoulder pain.  Patient states that she fell about 1 to 2 months ago and has had increased pain since.  She is a right-hand-dominant individual with no history of prior difficulty with her shoulder.  She fell backwards onto the posterior aspect of her shoulder.  She describes anterior pain since that event that she noticed immediately at the time of the injury but feels like it got really bad about 2 days after the injury.  She has primarily anterior pain but does have some pain that radiates down to the right wrist.  She has associated numbness and tingling in her hand that is new.  Has some neck pain and radicular pain as well as trapezius pain.  No history of prior surgery or issue with her neck or shoulder.  Taking gabapentin and diclofenac without much relief.  She went to [facility] and was told she had a fracture and had arthritis in her shoulder.  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  Pain will wake her up at night.  She does have some subjective instability of the shoulder but has no history of shoulder dislocation.  Feels like "something is shifting"..                ROS: All systems reviewed are negative as they relate to the chief complaint within the history of present illness.  Patient denies fevers or chills.  Assessment & Plan: Visit Diagnoses:  1. Right shoulder pain, unspecified chronicity     Plan: Patient is a 52 year old female who presents for evaluation of right shoulder pain with radicular pain down  the arm.  This all stems from a fall about 1 to 2 months ago.  Had previous evaluation at [facility] urgent care and she was told she has a fracture and arthritis in her shoulder.  She has imaging taken today demonstrating moderate degenerative changes at multiple levels in the cervical spine.  No significant fracture or acute change noted in the right shoulder.  With her continued pain over the last 2 months that has been unrelenting, think that obtaining MRI arthrogram of the right shoulder to evaluate for labral tear is indicated.  Also think we need to get MRI of the cervical spine to evaluate this radicular component to her pain.  She seems to have started to develop frozen shoulder based on her loss of passive motion of the right shoulder relative to the left.  Will try cortisone injection into the glenohumeral joint today to give her some relief and allow her to move her shoulder more.  See her back afterward to review MRI scans.  Follow-Up Instructions: No follow-ups on file.   Orders:  Orders Placed This Encounter  Procedures   XR Shoulder Right   XR Cervical Spine 2 or 3 views   US Guided Needle Placement - No Linked Charges   MR Cervical Spine w/o contrast   MR  SHOULDER RIGHT W CONTRAST   Arthrogram   No orders of the defined types were placed in this encounter.     Procedures: No procedures performed   Clinical Data: No additional findings.  Objective: Vital Signs: LMP 08/20/2011   Physical Exam:  Constitutional: Patient appears well-developed HEENT:  Head: Normocephalic Eyes:EOM are normal Neck: Normal range of motion Cardiovascular: Normal rate Pulmonary/chest: Effort normal Neurologic: Patient is alert Skin: Skin is warm Psychiatric: Patient has normal mood and affect  Ortho Exam: Ortho exam demonstrates right shoulder with 45 degrees X rotation, and 60 degrees abduction, 90 degrees forward elevation passively and actively.  This compared with the left shoulder  with 60 degrees X rotation, 90 degrees abduction, 130 degrees forward elevation both passively and actively.  She has good rotator cuff strength of supra, infra, subscap.  Does have moderate to severe tenderness over the bicipital groove.  Positive O'Brien sign.  She has no tenderness over the [facility] joint.  Does have positive Lhermitte sign with reproduction of right radicular pain.  Negative Spurling sign.  Intact EPL, FPL, finger abduction, pronation/supination, bicep, tricep, deltoid of bilateral upper extremities rated 5/5.  She has axillary nerve intact with deltoid firing.  Negative apprehension sign of the right shoulder.  Specialty Comments:  No specialty comments available.  Imaging: No results found.   PMFS History: Patient Active Problem List   Diagnosis Date Noted   Type 2 diabetes mellitus (HCC) 11/05/2021   GERD (gastroesophageal reflux disease) 11/05/2021   S/P laparoscopic-assisted sigmoidectomy 11/04/2021   Abnormal CT scan 08/05/2021   Hx of diverticulitis of colon 08/05/2021   Colovaginal fistula 07/03/2021   Acquired palmar and plantar hyperkeratosis 01/25/2021   Hyperglycemia 02/01/2020   Crohn's disease of large intestine with abscess (HCC) 01/31/2020   Acute diverticulitis 01/09/2020   Diverticulitis of large intestine with abscess without bleeding 06/07/2017   Abscess of sigmoid colon due to diverticulitis 05/24/2017   Tobacco dependence 05/11/2017   Essential hypertension 05/11/2017   Reflux esophagitis 05/11/2017   Tinea pedis of left foot 04/11/2015   C. difficile colitis 08/26/2011   Enteritis 08/25/2011   Past Medical History:  Diagnosis Date   Acid reflux    Blood transfusion    Diverticulitis    Hypertension    Sickle cell trait (HCC)     Family History  Problem Relation Age of Onset   Hypertension Mother    Diabetes Mother    Diabetes type II Mother    Diabetes type II Father    Stroke Father    Lung cancer Father 32   Esophageal cancer  Maternal Grandfather    Colon cancer Neg Hx    Pancreatic cancer Neg Hx    Stomach cancer Neg Hx     Past Surgical History:  Procedure Laterality Date   ABDOMINAL HYSTERECTOMY  2001   partial hysterectomy - emergency during last C-section   CESAREAN SECTION  1991; 1997; 2001   CESAREAN SECTION     FLEXIBLE SIGMOIDOSCOPY N/A 11/04/2021   Procedure: FLEXIBLE SIGMOIDOSCOPY;  Surgeon: Andria Meuse, MD;  Location: [facility];  Service: General;  Laterality: N/A;   XI ROBOTIC ASSISTED LOWER ANTERIOR RESECTION N/A 11/04/2021   Procedure: XI ROBOTIC ASSISTED LOWER ANTERIOR RESECTION, RESECTION OF COLOVAGINAL FISTULA, BILATERAL TAP BLOCK, INJECTION OF FIREFLY;  Surgeon: Andria Meuse, MD;  Location: [facility];  Service: General;  Laterality: N/A;   Social History   Occupational History   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, nature:  [employer]    Comment: [employer]  Tobacco Use   Smoking status: Some Days    Current packs/day: 0.50    Average packs/day: 0.5 packs/day for 31.0 years (15.5 ttl pk-yrs)    Types: Cigarettes   Smokeless tobacco: Never  Vaping Use   Vaping status: Never Used  Substance and Sexual Activity   Alcohol use: Yes    Alcohol/week: 1.0 standard drink of alcohol    Types: 1 Standard drinks or equivalent per week    Comment: occassionally   Drug use: Yes    Frequency: 7.0 times per week    Types: Marijuana   Sexual activity: Yes    Partners: Male    Birth control/protection: Surgical

## 2023-07-06 ENCOUNTER — Other Ambulatory Visit: Payer: Self-pay

## 2023-07-11 ENCOUNTER — Other Ambulatory Visit: Payer: Self-pay

## 2023-07-19 ENCOUNTER — Emergency Department (HOSPITAL_COMMUNITY): Payer: 59

## 2023-07-19 ENCOUNTER — Ambulatory Visit (HOSPITAL_COMMUNITY)
Admission: EM | Admit: 2023-07-19 | Discharge: 2023-07-19 | Disposition: A | Discharge status: Home / Self Care | Payer: 59

## 2023-07-19 ENCOUNTER — Emergency Department (HOSPITAL_COMMUNITY)
Admission: EM | Admit: 2023-07-19 | Discharge: 2023-07-19 | Disposition: A | Discharge status: Home / Self Care | Payer: 59 | Attending: Emergency Medicine | Admitting: Emergency Medicine

## 2023-07-19 ENCOUNTER — Other Ambulatory Visit: Payer: Self-pay

## 2023-07-19 ENCOUNTER — Encounter (HOSPITAL_COMMUNITY): Payer: Self-pay | Admitting: Emergency Medicine

## 2023-07-19 DIAGNOSIS — R1032 Left lower quadrant pain: Secondary | ICD-10-CM

## 2023-07-19 DIAGNOSIS — I10 Essential (primary) hypertension: Secondary | ICD-10-CM | POA: Diagnosis not present

## 2023-07-19 DIAGNOSIS — R110 Nausea: Secondary | ICD-10-CM | POA: Diagnosis not present

## 2023-07-19 DIAGNOSIS — E119 Type 2 diabetes mellitus without complications: Secondary | ICD-10-CM | POA: Insufficient documentation

## 2023-07-19 DIAGNOSIS — Z79899 Other long term (current) drug therapy: Secondary | ICD-10-CM | POA: Diagnosis not present

## 2023-07-19 DIAGNOSIS — K5900 Constipation, unspecified: Secondary | ICD-10-CM | POA: Diagnosis not present

## 2023-07-19 LAB — CBC WITH DIFFERENTIAL/PLATELET
Abs Immature Granulocytes: 0 10*3/uL (ref 0.00–0.07)
Basophils Absolute: 0 10*3/uL (ref 0.0–0.1)
Basophils Relative: 0 %
Eosinophils Absolute: 0.2 10*3/uL (ref 0.0–0.5)
Eosinophils Relative: 2 %
HCT: 41.1 % (ref 36.0–46.0)
Hemoglobin: 13.1 g/dL (ref 12.0–15.0)
Lymphocytes Relative: 27 %
Lymphs Abs: 2.3 10*3/uL (ref 0.7–4.0)
MCH: 21.6 pg — ABNORMAL LOW (ref 26.0–34.0)
MCHC: 31.9 g/dL (ref 30.0–36.0)
MCV: 67.8 fL — ABNORMAL LOW (ref 80.0–100.0)
Monocytes Absolute: 0.5 10*3/uL (ref 0.1–1.0)
Monocytes Relative: 6 %
Neutro Abs: 5.5 10*3/uL (ref 1.7–7.7)
Neutrophils Relative %: 65 %
Platelets: 265 10*3/uL (ref 150–400)
RBC: 6.06 MIL/uL — ABNORMAL HIGH (ref 3.87–5.11)
RDW: 15.4 % (ref 11.5–15.5)
WBC: 8.5 10*3/uL (ref 4.0–10.5)
nRBC: 0 % (ref 0.0–0.2)
nRBC: 1 /100{WBCs} — ABNORMAL HIGH

## 2023-07-19 LAB — COMPREHENSIVE METABOLIC PANEL
ALT: 22 U/L (ref 0–44)
AST: 27 U/L (ref 15–41)
Albumin: 3.7 g/dL (ref 3.5–5.0)
Alkaline Phosphatase: 80 U/L (ref 38–126)
Anion gap: 11 (ref 5–15)
BUN: 13 mg/dL (ref 6–20)
CO2: 20 mmol/L — ABNORMAL LOW (ref 22–32)
Calcium: 9.3 mg/dL (ref 8.9–10.3)
Chloride: 112 mmol/L — ABNORMAL HIGH (ref 98–111)
Creatinine, Ser: 0.78 mg/dL (ref 0.44–1.00)
GFR, Estimated: >60 mL/min (ref >60)
Glucose, Bld: 110 mg/dL — ABNORMAL HIGH (ref 70–99)
Potassium: 3.6 mmol/L (ref 3.5–5.1)
Sodium: 143 mmol/L (ref 135–145)
Total Bilirubin: 0.4 mg/dL (ref <1.2)
Total Protein: 7 g/dL (ref 6.5–8.1)

## 2023-07-19 LAB — URINALYSIS, ROUTINE W REFLEX MICROSCOPIC
Bilirubin Urine: NEGATIVE
Glucose, UA: NEGATIVE mg/dL
Hgb urine dipstick: NEGATIVE
Ketones, ur: NEGATIVE mg/dL
Leukocytes,Ua: NEGATIVE
Nitrite: NEGATIVE
Protein, ur: NEGATIVE mg/dL
Specific Gravity, Urine: >1.046 — ABNORMAL HIGH (ref 1.005–1.030)
pH: 5 (ref 5.0–8.0)

## 2023-07-19 LAB — MAGNESIUM: Magnesium: 1.8 mg/dL (ref 1.7–2.4)

## 2023-07-19 LAB — LIPASE, BLOOD: Lipase: 33 U/L (ref 11–51)

## 2023-07-19 MED ORDER — HYDROMORPHONE HCL 1 MG/ML IJ SOLN
0.5000 mg | Freq: Once | INTRAMUSCULAR | Status: AC
  Administered 2023-07-19: 0.5 mg via INTRAVENOUS
  Filled 2023-07-19: qty 1

## 2023-07-19 MED ORDER — METOCLOPRAMIDE HCL 5 MG/ML IJ SOLN
10.0000 mg | Freq: Once | INTRAMUSCULAR | Status: AC
  Administered 2023-07-19: 10 mg via INTRAVENOUS
  Filled 2023-07-19: qty 2

## 2023-07-19 MED ORDER — LACTATED RINGERS IV BOLUS
1000.0000 mL | Freq: Once | INTRAVENOUS | Status: AC
  Administered 2023-07-19: 1000 mL via INTRAVENOUS

## 2023-07-19 MED ORDER — IOHEXOL 350 MG/ML SOLN
75.0000 mL | Freq: Once | INTRAVENOUS | Status: AC | PRN
  Administered 2023-07-19: 75 mL via INTRAVENOUS

## 2023-07-19 NOTE — ED Provider Notes (Signed)
[facility]    CSN: [id number] Arrival date & time: 07/19/23  4098      History   Chief Complaint Chief Complaint  Patient presents with   Abdominal Pain    HPI Amanda Garner is a 52 y.o. female.  Abdominal pain started this morning  Worse in the left lower quadrant Rated 9/10 Nausea without vomiting No fever Loose stool this morning, last week was constipated  Reports feeling this way last year when she had "infected colon" Per chart review, had colovaginal fistula and sigmoidectomy in march and April of 89  Past Medical History:  Diagnosis Date   Acid reflux    Blood transfusion    Diverticulitis    Hypertension    Sickle cell trait [facility])     Patient Active Problem List   Diagnosis Date Noted   Type 2 diabetes mellitus (HCC) 11/05/2021   GERD (gastroesophageal reflux disease) 11/05/2021   S/P laparoscopic-assisted sigmoidectomy 11/04/2021   Abnormal CT scan 08/05/2021   Hx of diverticulitis of colon 08/05/2021   Colovaginal fistula 07/03/2021   Acquired palmar and plantar hyperkeratosis 01/25/2021   Hyperglycemia 02/01/2020   Crohn's disease of large intestine with abscess (HCC) 01/31/2020   Acute diverticulitis 01/09/2020   Diverticulitis of large intestine with abscess without bleeding 06/07/2017   Abscess of sigmoid colon due to diverticulitis 05/24/2017   Tobacco dependence 05/11/2017   Essential hypertension 05/11/2017   Reflux esophagitis 05/11/2017   Tinea pedis of left foot 04/11/2015   C. difficile colitis 08/26/2011   Enteritis 08/25/2011    Past Surgical History:  Procedure Laterality Date   ABDOMINAL HYSTERECTOMY  2001   partial hysterectomy - emergency during last C-section   CESAREAN SECTION  1991; 1997; 2001   CESAREAN SECTION     FLEXIBLE SIGMOIDOSCOPY N/A 11/04/2021   Procedure: FLEXIBLE SIGMOIDOSCOPY;  Surgeon: Andria Meuse, MD;  Location: [facility];  Service: General;  Laterality: N/A;   XI ROBOTIC ASSISTED  LOWER ANTERIOR RESECTION N/A 11/04/2021   Procedure: XI ROBOTIC ASSISTED LOWER ANTERIOR RESECTION, RESECTION OF COLOVAGINAL FISTULA, BILATERAL TAP BLOCK, INJECTION OF FIREFLY;  Surgeon: Andria Meuse, MD;  Location: [facility];  Service: General;  Laterality: N/A;    OB History   No obstetric history on file.      Home Medications    Prior to Admission medications   Medication Sig Start Date End Date Taking? Authorizing Provider  Accu-Chek FastClix Lancets MISC Use as directed to check blood glucose once a day before breakfast. 12/21/21   Hoy Register, MD  albuterol (VENTOLIN HFA) 108 (90 Base) MCG/ACT inhaler Inhale 2 puffs into the lungs every 4 (four) hours as needed for wheezing or shortness of breath. 10/22/22   Anders Simmonds, PA-C  amLODipine (NORVASC) 10 MG tablet Take 1 tablet (10 mg total) by mouth daily. 05/26/23 05/25/24  Hoy Register, MD  atorvastatin (LIPITOR) 20 MG tablet Take 1 tablet (20 mg total) by mouth daily. 05/26/23   Hoy Register, MD  benzonatate (TESSALON) 200 MG capsule Take 1 capsule (200 mg total) by mouth 3 (three) times daily. 10/22/22   Anders Simmonds, PA-C  Blood Glucose Monitoring Suppl (BLOOD GLUCOSE MONITOR SYSTEM) w/Device KIT Use as directed to check blood glucose once a day before breakfast. 11/06/21   Narda Bonds, MD  buPROPion (WELLBUTRIN XL) 150 MG 24 hr tablet Take 1 tablet (150 mg total) by mouth daily. For smoking cessation 05/26/23   Hoy Register, MD  esomeprazole (NEXIUM) 40  MG capsule Take 1 capsule (40 mg total) by mouth daily. Patient not taking: Reported on 07/19/2023 05/26/23   Hoy Register, MD  famotidine (PEPCID) 40 MG tablet Take 1 tablet (40 mg total) by mouth at bedtime. Patient not taking: Reported on 07/19/2023 05/26/23   Hoy Register, MD  gabapentin (NEURONTIN) 300 MG capsule Take 1 capsule (300 mg total) by mouth at bedtime. 03/29/22   Hoy Register, MD  glucose blood test strip Use to test blood glucose daily  before breakfast. 11/06/21   Narda Bonds, MD  naproxen (NAPROSYN) 500 MG tablet TAKE 1 TABLET(500 MG) BY MOUTH TWICE DAILY WITH A MEAL 06/24/22   Hoy Register, MD  nicotine (NICODERM CQ) 7 mg/24hr patch Place 1 patch (7 mg total) onto the skin daily. 05/26/23   Hoy Register, MD  predniSONE (DELTASONE) 10 MG tablet 6,5,4,3,2,1 take each days dose at once in am with food. Patient not taking: Reported on 07/19/2023 10/22/22   Anders Simmonds, PA-C  Semaglutide, 2 MG/DOSE, 8 MG/3ML SOPN Inject 2 mg as directed once a week. Patient not taking: Reported on 07/19/2023 04/20/23   Hoy Register, MD  terbinafine (LAMISIL) 250 MG tablet Take 1 tablet (250 mg total) by mouth daily. 08/19/21   Lenn Sink, DPM  tirzepatide [facility]) 12.5 MG/0.5ML Pen Inject 12.5 mg into the skin once a week. For 4 weeks then increase to 15mg  05/26/23   Hoy Register, MD  tirzepatide [facility]) 15 MG/0.5ML Pen Inject 15 mg into the skin once a week. 05/26/23   Hoy Register, MD    Family History Family History  Problem Relation Age of Onset   Hypertension Mother    Diabetes Mother    Diabetes type II Mother    Diabetes type II Father    Stroke Father    Lung cancer Father 66   Esophageal cancer Maternal Grandfather    Colon cancer Neg Hx    Pancreatic cancer Neg Hx    Stomach cancer Neg Hx     Social History Social History   Tobacco Use   Smoking status: Some Days    Current packs/day: 0.50    Average packs/day: 0.5 packs/day for 31.0 years (15.5 ttl pk-yrs)    Types: Cigarettes   Smokeless tobacco: Never  Vaping Use   Vaping status: Never Used  Substance Use Topics   Alcohol use: Yes    Alcohol/week: 1.0 standard drink of alcohol    Types: 1 Standard drinks or equivalent per week    Comment: occassionally   Drug use: Yes    Frequency: 7.0 times per week    Types: Marijuana     Allergies   Patient has no known allergies.   Review of Systems Review of Systems  Gastrointestinal:   Positive for abdominal pain.   Per HPI  Physical Exam Triage Vital Signs ED Triage Vitals  Encounter Vitals Group     BP 07/19/23 0849 (!) 127/91     Systolic BP Percentile --      Diastolic BP Percentile --      Pulse Rate 07/19/23 0849 97     Resp 07/19/23 0849 20     Temp 07/19/23 0849 98.1 F (36.7 C)     Temp Source 07/19/23 0849 Oral     SpO2 07/19/23 0849 98 %     Weight --      Height --      Head Circumference --      Peak Flow --  Pain Score 07/19/23 0846 9     Pain Loc --      Pain Education --      Exclude from Growth Chart --    No data found.  Updated Vital Signs BP 122/79 (BP Location: Left Arm)   Pulse 97   Temp 98.1 F (36.7 C) (Oral)   Resp 20   LMP 08/20/2011   SpO2 98%   Physical Exam Vitals and nursing note reviewed.  Constitutional:      Appearance: She is ill-appearing.  HENT:     Mouth/Throat:     Mouth: Mucous membranes are moist.     Pharynx: Oropharynx is clear.  Eyes:     Conjunctiva/sclera: Conjunctivae normal.  Cardiovascular:     Rate and Rhythm: Normal rate and regular rhythm.     Pulses: Normal pulses.     Heart sounds: Normal heart sounds.  Pulmonary:     Effort: Pulmonary effort is normal.     Breath sounds: Normal breath sounds.  Abdominal:     Tenderness: There is abdominal tenderness in the periumbilical area, suprapubic area and left lower quadrant. There is no right CVA tenderness, left CVA tenderness, guarding or rebound.  Musculoskeletal:        General: Normal range of motion.  Skin:    General: Skin is warm and dry.  Neurological:     Mental Status: She is alert and oriented to person, place, and time.      UC Treatments / Results  Labs (all labs ordered are listed, but only abnormal results are displayed) Labs Reviewed - No data to display  EKG   Radiology No results found.  Procedures Procedures (including critical care time)  Medications Ordered in UC Medications - No data to  display  Initial Impression / Assessment and Plan / UC Course  I have reviewed the triage vital signs and the nursing notes.  Pertinent labs & imaging results that were available during my care of the patient were reviewed by me and considered in my medical decision making (see chart for details).  Patient is ill-appearing, abdominal pain with palpation periumbilical and LLQ. No guarding at this time. She is very uncomfortable with exam.  Vitals are stable With patient history and severity of pain today, needs higher level of care, imaging not available in urgent care setting. She is discharged to the ED across the parking lot with husband as chaperone.   Final Clinical Impressions(s) / UC Diagnoses   Final diagnoses:  Left lower quadrant abdominal pain   Discharge Instructions   None    ED Prescriptions   None    PDMP not reviewed this encounter.   Marlow Baars, [place] 07/19/23 [id number]

## 2023-07-19 NOTE — ED Triage Notes (Signed)
Patient woke with abdominal pain and diarrhea.    Patient relates how she feels to an experience she had last year with surgery for polyps.   Recently switched medicine from ozempic to [facility].  Left side of abdomen is cramping, twisting.  Patient has had frequent diarrhea this morning.  No vomiting, but has had nausea

## 2023-07-19 NOTE — ED Provider Notes (Signed)
[facility] AT [facility] Provider Note   CSN: [id number] Arrival date & time: 07/19/23  1601     History  No chief complaint on file.   Edan Johnigan is a 52 y.o. female.  HPI Patient presents for abdominal pain.  Medical history includes HTN, diverticulitis, GERD, C. difficile, DM, Crohn's disease.  Last night, she was in her normal state of health.  This morning, she had acute onset of left lower quadrant abdominal pain.  She has had nausea without vomiting.  She had some constipation last week.  She had loose stool this morning.  She was seen at urgent care prior to arrival.  Vital signs at the time were normal.  She was noted to have periumbilical and left lower quadrant tenderness.  She was advised to come to the ED for further evaluation.  She has not yet received anything for pain.  Current pain is 8/10 in severity.  She states that symptoms are reminiscent of prior episodes of diverticulitis.    Home Medications Prior to Admission medications   Medication Sig Start Date End Date Taking? Authorizing Provider  Accu-Chek FastClix Lancets MISC Use as directed to check blood glucose once a day before breakfast. 12/21/21   Hoy Register, MD  albuterol (VENTOLIN HFA) 108 (90 Base) MCG/ACT inhaler Inhale 2 puffs into the lungs every 4 (four) hours as needed for wheezing or shortness of breath. 10/22/22   Anders Simmonds, PA-C  amLODipine (NORVASC) 10 MG tablet Take 1 tablet (10 mg total) by mouth daily. 05/26/23 05/25/24  Hoy Register, MD  atorvastatin (LIPITOR) 20 MG tablet Take 1 tablet (20 mg total) by mouth daily. 05/26/23   Hoy Register, MD  benzonatate (TESSALON) 200 MG capsule Take 1 capsule (200 mg total) by mouth 3 (three) times daily. 10/22/22   Anders Simmonds, PA-C  Blood Glucose Monitoring Suppl (BLOOD GLUCOSE MONITOR SYSTEM) w/Device KIT Use as directed to check blood glucose once a day before breakfast. 11/06/21   Narda Bonds, MD   buPROPion (WELLBUTRIN XL) 150 MG 24 hr tablet Take 1 tablet (150 mg total) by mouth daily. For smoking cessation 05/26/23   Hoy Register, MD  esomeprazole (NEXIUM) 40 MG capsule Take 1 capsule (40 mg total) by mouth daily. Patient not taking: Reported on 07/19/2023 05/26/23   Hoy Register, MD  famotidine (PEPCID) 40 MG tablet Take 1 tablet (40 mg total) by mouth at bedtime. Patient not taking: Reported on 07/19/2023 05/26/23   Hoy Register, MD  gabapentin (NEURONTIN) 300 MG capsule Take 1 capsule (300 mg total) by mouth at bedtime. 03/29/22   Hoy Register, MD  glucose blood test strip Use to test blood glucose daily before breakfast. 11/06/21   Narda Bonds, MD  naproxen (NAPROSYN) 500 MG tablet TAKE 1 TABLET(500 MG) BY MOUTH TWICE DAILY WITH A MEAL 06/24/22   Hoy Register, MD  nicotine (NICODERM CQ) 7 mg/24hr patch Place 1 patch (7 mg total) onto the skin daily. 05/26/23   Hoy Register, MD  predniSONE (DELTASONE) 10 MG tablet 6,5,4,3,2,1 take each days dose at once in am with food. Patient not taking: Reported on 07/19/2023 10/22/22   Anders Simmonds, PA-C  Semaglutide, 2 MG/DOSE, 8 MG/3ML SOPN Inject 2 mg as directed once a week. Patient not taking: Reported on 07/19/2023 04/20/23   Hoy Register, MD  terbinafine (LAMISIL) 250 MG tablet Take 1 tablet (250 mg total) by mouth daily. 08/19/21   Lenn Sink, DPM  tirzepatide (MOUNJARO) 12.5 MG/0.5ML Pen Inject 12.5 mg into the skin once a week. For 4 weeks then increase to 15mg  05/26/23   Hoy Register, MD  tirzepatide [facility]) 15 MG/0.5ML Pen Inject 15 mg into the skin once a week. 05/26/23   Hoy Register, MD      Allergies    Patient has no known allergies.    Review of Systems   Review of Systems  Gastrointestinal:  Positive for abdominal pain and nausea.  All other systems reviewed and are negative.   Physical Exam Updated Vital Signs BP 122/81 (BP Location: Right Arm)   Pulse 86   Temp 98.1 F (36.7 C)  (Oral)   Resp 18   LMP 08/20/2011   SpO2 98%  Physical Exam Vitals and nursing note reviewed.  Constitutional:      General: She is not in acute distress.    Appearance: Normal appearance. She is well-developed. She is not ill-appearing, toxic-appearing or diaphoretic.  HENT:     Head: Normocephalic and atraumatic.     Right Ear: External ear normal.     Left Ear: External ear normal.     Nose: Nose normal.     Mouth/Throat:     Mouth: Mucous membranes are moist.  Eyes:     Extraocular Movements: Extraocular movements intact.     Conjunctiva/sclera: Conjunctivae normal.  Cardiovascular:     Rate and Rhythm: Normal rate and regular rhythm.     Heart sounds: No murmur heard. Pulmonary:     Effort: Pulmonary effort is normal. No respiratory distress.     Breath sounds: Normal breath sounds. No wheezing or rales.  Abdominal:     General: There is no distension.     Palpations: Abdomen is soft.     Tenderness: There is abdominal tenderness. There is no guarding or rebound.  Musculoskeletal:        General: No swelling. Normal range of motion.     Cervical back: Normal range of motion and neck supple.     Right lower leg: No edema.     Left lower leg: No edema.  Skin:    General: Skin is warm and dry.     Coloration: Skin is not jaundiced or pale.  Neurological:     General: No focal deficit present.     Mental Status: She is alert and oriented to person, place, and time.  Psychiatric:        Mood and Affect: Mood normal.        Behavior: Behavior normal.     ED Results / Procedures / Treatments   Labs (all labs ordered are listed, but only abnormal results are displayed) Labs Reviewed  COMPREHENSIVE METABOLIC PANEL - Abnormal; Notable for the following components:      Result Value   Chloride 112 (*)    CO2 20 (*)    Glucose, Bld 110 (*)    All other components within normal limits  CBC WITH DIFFERENTIAL/PLATELET - Abnormal; Notable for the following components:    RBC 6.06 (*)    MCV 67.8 (*)    MCH 21.6 (*)    nRBC 1 (*)    All other components within normal limits  LIPASE, BLOOD  MAGNESIUM  URINALYSIS, ROUTINE W REFLEX MICROSCOPIC    EKG None  Radiology CT ABDOMEN PELVIS W CONTRAST  Result Date: 07/19/2023 CLINICAL DATA:  Left lower quadrant abdominal pain. EXAM: CT ABDOMEN AND PELVIS WITH CONTRAST TECHNIQUE: Multidetector CT imaging of the abdomen  and pelvis was performed using the standard protocol following bolus administration of intravenous contrast. RADIATION DOSE REDUCTION: This exam was performed according to the departmental dose-optimization program which includes automated exposure control, adjustment of the mA and/or kV according to patient size and/or use of iterative reconstruction technique. CONTRAST:  75mL OMNIPAQUE IOHEXOL 350 MG/ML SOLN COMPARISON:  June 30, 2021. FINDINGS: Lower chest: No acute abnormality. Hepatobiliary: No focal liver abnormality is seen. No gallstones, gallbladder wall thickening, or biliary dilatation. Pancreas: Unremarkable. No pancreatic ductal dilatation or surrounding inflammatory changes. Spleen: Normal in size without focal abnormality. Adrenals/Urinary Tract: Adrenal glands are unremarkable. Kidneys are normal, without renal calculi, focal lesion, or hydronephrosis. Bladder is unremarkable. Stomach/Bowel: Stomach is unremarkable. There is no evidence of bowel obstruction or inflammation. The appendix is not clearly visualized. Postsurgical changes are seen involving the sigmoid colon. Vascular/Lymphatic: No significant vascular findings are present. No enlarged abdominal or pelvic lymph nodes. Reproductive: Status post hysterectomy. No adnexal masses. Other: Small fat containing periumbilical hernia. No ascites is noted. Musculoskeletal: No acute or significant osseous findings. IMPRESSION: Small fat containing periumbilical hernia. No other significant abnormality seen in the abdomen or pelvis.  Electronically Signed   By: Lupita Raider M.D.   On: 07/19/2023 12:32    Procedures Procedures    Medications Ordered in ED Medications  HYDROmorphone (DILAUDID) injection 0.5 mg (0.5 mg Intravenous Given 07/19/23 0937)  metoCLOPramide (REGLAN) injection 10 mg (10 mg Intravenous Given 07/19/23 0937)  lactated ringers bolus 1,000 mL (0 mLs Intravenous Stopped 07/19/23 1041)  iohexol (OMNIPAQUE) 350 MG/ML injection 75 mL (75 mLs Intravenous Contrast Given 07/19/23 1130)    ED Course/ Medical Decision Making/ A&P                                 Medical Decision Making Amount and/or Complexity of Data Reviewed Labs: ordered. Radiology: ordered.  Risk Prescription drug management.   This patient presents to the ED for concern of abdominal pain, this involves an extensive number of treatment options, and is a complaint that carries with it a high risk of complications and morbidity.  The differential diagnosis includes colitis, diverticulitis, constipation, SBO, medication side effect, UTI, nephrolithiasis   Co morbidities that complicate the patient evaluation  HTN, diverticulitis, GERD, C. difficile, DM, Crohn's disease   Additional history obtained:  Additional history obtained from N/A External records from outside source obtained and reviewed including EMR   Lab Tests:  I Ordered, and personally interpreted labs.  The pertinent results include: Normal hemoglobin, no leukocytosis, normal kidney function, normal electrolytes, normal hepatobiliary enzymes   Imaging Studies ordered:  I ordered imaging studies including CT of abdomen and pelvis I independently visualized and interpreted imaging which showed no acute findings I agree with the radiologist interpretation   Cardiac Monitoring: / EKG:  The patient was maintained on a cardiac monitor.  I personally viewed and interpreted the cardiac monitored which showed an underlying rhythm of: Sinus rhythm  Problem  List / ED Course / Critical interventions / Medication management  Patient presenting for acute onset of left-sided abdominal pain starting this morning.  On arrival in the ED, she is overall well-appearing.  Her abdomen is soft.  She endorses tenderness throughout the left side of her abdomen.  Per chart review, she has history of colovaginal fistula.  She underwent resection in March of last year.  She has also has documented history of diverticulitis,  C. difficile colitis, and Crohn's disease.  Patient was ordered pain medication, nausea medication, and IV fluids.  Workup was initiated.  Serum lab work was reassuring.  No leukocytosis was present.  CT of abdomen pelvis did not show any acute findings.  Patient had improved symptoms while in the ED.  She thinks her abdominal pain may be secondary to [facility], which she was recently switched to.  Patient to discuss this further with her primary care doctor.  Patient stated that she needed to leave prior to urinalysis results.  She was advised to follow-up on results and discuss with primary care doctor.  She was discharged in stable condition. I ordered medication including IV fluids for hydration; Dilaudid for analgesia; Reglan for nausea Reevaluation of the patient after these medicines showed that the patient improved I have reviewed the patients home medicines and have made adjustments as needed   Social Determinants of Health:  Has PCP         Final Clinical Impression(s) / ED Diagnoses Final diagnoses:  LLQ pain    Rx / DC Orders ED Discharge Orders     None         Gloris Manchester, MD 07/19/23 1323

## 2023-07-19 NOTE — ED Notes (Signed)
Patient is being discharged from the Urgent Care and sent to the Emergency Department via POV . Per [employer], PA, patient is in need of higher level of care due to limited resources and intestinal surgeries . Patient is aware and verbalizes understanding of plan of care.  Vitals:   07/19/23 0849 07/19/23 0851  BP: (!) 127/91 122/79  Pulse: 97   Resp: 20   Temp: 98.1 F (36.7 C)   SpO2: 98%

## 2023-07-19 NOTE — ED Triage Notes (Signed)
Pt here sent down from UC  with c/o left lower quadrant  pain along with some n/v started around 5am , had abd surgery but also on [facility]

## 2023-07-19 NOTE — Discharge Instructions (Signed)
You will need to follow-up on the results of your urinalysis.  If an infection is present, you will need to get antibiotics.  Follow-up with your primary care doctor about this.  Also discuss the [facility] and possible side effects that may be causing your stomach upset.  Return to the emergency department for any new or worsening symptoms of concern.

## 2023-07-22 ENCOUNTER — Other Ambulatory Visit: Payer: Self-pay

## 2023-08-05 ENCOUNTER — Ambulatory Visit
Admission: RE | Admit: 2023-08-05 | Discharge: 2023-08-05 | Disposition: A | Discharge status: Home / Self Care | Payer: 59 | Source: Ambulatory Visit | Attending: Surgical

## 2023-08-05 DIAGNOSIS — M25511 Pain in right shoulder: Secondary | ICD-10-CM

## 2023-08-05 MED ORDER — IOPAMIDOL (ISOVUE-M 200) INJECTION 41%
12.0000 mL | Freq: Once | INTRAMUSCULAR | Status: AC
  Administered 2023-08-05: 12 mL via INTRA_ARTICULAR

## 2023-08-08 ENCOUNTER — Ambulatory Visit
Admission: RE | Admit: 2023-08-08 | Discharge: 2023-08-08 | Disposition: A | Discharge status: Home / Self Care | Payer: 59 | Source: Ambulatory Visit | Attending: Surgical

## 2023-08-08 DIAGNOSIS — M25511 Pain in right shoulder: Secondary | ICD-10-CM

## 2023-08-09 ENCOUNTER — Encounter: Payer: Self-pay | Admitting: Family Medicine

## 2023-08-09 ENCOUNTER — Other Ambulatory Visit: Payer: Self-pay

## 2023-08-09 NOTE — Telephone Encounter (Signed)
 Care team updated and letter sent for eye exam notes.

## 2023-08-11 ENCOUNTER — Other Ambulatory Visit: Payer: Self-pay | Admitting: Surgical

## 2023-08-11 ENCOUNTER — Telehealth: Payer: Self-pay | Admitting: Surgical

## 2023-08-11 MED ORDER — TRAMADOL HCL 50 MG PO TABS: 50.0000 mg | ORAL_TABLET | Freq: Every evening | ORAL | 0 refills | Status: DC | PRN

## 2023-08-11 NOTE — Telephone Encounter (Signed)
Patient called and she is in very bad pain. She wants to know can she get something for pain. She has a swollen rotator cuff. CB#[phone]

## 2023-08-11 NOTE — Telephone Encounter (Signed)
Sent in RX for tramadol at night to help with pain when sleeping

## 2023-08-25 ENCOUNTER — Encounter: Payer: Self-pay | Admitting: Surgical

## 2023-08-25 ENCOUNTER — Ambulatory Visit: Payer: 59 | Admitting: Surgical

## 2023-08-25 ENCOUNTER — Other Ambulatory Visit: Payer: Self-pay

## 2023-08-25 DIAGNOSIS — M25512 Pain in left shoulder: Secondary | ICD-10-CM | POA: Diagnosis not present

## 2023-08-25 DIAGNOSIS — M7502 Adhesive capsulitis of left shoulder: Secondary | ICD-10-CM | POA: Diagnosis not present

## 2023-08-25 DIAGNOSIS — M75101 Unspecified rotator cuff tear or rupture of right shoulder, not specified as traumatic: Secondary | ICD-10-CM

## 2023-08-25 MED ORDER — TRAMADOL HCL 50 MG PO TABS: 50.0000 mg | ORAL_TABLET | Freq: Every evening | ORAL | 0 refills | Status: DC | PRN

## 2023-08-25 MED ORDER — METHYLPREDNISOLONE ACETATE 40 MG/ML IJ SUSP
40.0000 mg | INTRAMUSCULAR | Status: AC | PRN
  Administered 2023-08-25: 40 mg via INTRA_ARTICULAR

## 2023-08-25 MED ORDER — BUPIVACAINE HCL 0.25 % IJ SOLN
9.0000 mL | INTRAMUSCULAR | Status: AC | PRN
  Administered 2023-08-25: 9 mL via INTRA_ARTICULAR

## 2023-08-25 MED ORDER — LIDOCAINE HCL 1 % IJ SOLN
5.0000 mL | INTRAMUSCULAR | Status: AC | PRN
  Administered 2023-08-25: 5 mL

## 2023-08-25 NOTE — Progress Notes (Signed)
 Office Visit Note   Patient: Amanda Garner           Date of Birth: 1971-08-18           MRN: [id number] Visit Date: 08/25/2023 Requested by: Delbert Clam, MD [address] [place],  [place] [address] PCP: Delbert Clam, MD  Subjective: Chief Complaint  Patient presents with   Other     Follow up to review MRI    HPI: Amanda Garner is a 53 y.o. female who presents to the office for MRI review. Patient denies any changes in symptoms.  Continues to complain mainly of right shoulder pain.  Localizes pain to the lateral and superior aspects of the shoulder.  Pain is worse with sleeping and with any range of motion of the right shoulder.  She had prior right glenohumeral injection at her last visit 2 months ago with 60 to 70% relief for about 2 days before pain recurred.  She also has symptoms that seem related to her cervical spine with numbness and tingling in her hand that is new and radiation of pain down to the right wrist as well as neck pain and trapezius pain.  She does have history of diabetes but last A1c was 5.8.  She has no significant medical history aside from diabetes but she does smoke about 4 to 5 cigarettes/day.  She works doing health and safety inspector work.  MRI results revealed: MR Cervical Spine w/o contrast Result Date: 08/20/2023 CLINICAL DATA:  Neck pain radiating into the right shoulder and right arm for 3 months. History of a fall. EXAM: MRI CERVICAL SPINE WITHOUT CONTRAST TECHNIQUE: Multiplanar, multisequence MR imaging of the cervical spine was performed. No intravenous contrast was administered. COMPARISON:  None Available. FINDINGS: Alignment: There is reversal of the normal cervical lordosis centered about the C5-6 level. No listhesis. Vertebrae: No fracture, evidence of discitis, or bone lesion. Cord: Normal signal throughout. Posterior Fossa, vertebral arteries, paraspinal tissues: Negative. Disc levels: C2-3: Negative. C3-4: Shallow disc bulge.  No stenosis.  C4-5: A left paracentral disc protrusion contacts the ventral cord. Mild right foraminal narrowing is present. The left foramen is open. C5-6: There is a disc osteophyte complex and some uncovertebral spurring. Mild flattening of the ventral cord due to bursal lordosis and spondylosis is present. Mild left foraminal narrowing. The right foramen is open. C6-7: Shallow disc bulge to the right without stenosis. C7-T1: Negative. IMPRESSION: 1. Left paracentral disc protrusion at C4-5 contacts the ventral cord. Mild right foraminal narrowing is present at this level. 2. Reversal of the normal cervical lordosis centered about C5-6 where there is a disc osteophyte complex and uncovertebral spurring. Mild flattening of the ventral cord and mild left foraminal narrowing are present at this level. Electronically Signed   By: Debby Prader M.D.   On: 08/20/2023 11:39   MR SHOULDER RIGHT W CONTRAST Result Date: 08/09/2023 CLINICAL DATA:  Neck pain radiating to the right shoulder. Ongoing for 3 months status post fall EXAM: MRI OF THE RIGHT SHOULDER WITH CONTRAST TECHNIQUE: Multiplanar, multisequence MR imaging of the right shoulder was performed following the administration of intra-articular contrast. CONTRAST:  See Injection Documentation. COMPARISON:  None Available. FINDINGS: Rotator cuff: Complete full-thickness, full width tear of the supraspinatus tendon with 18 mm of retraction. Severe infraspinatus tendinosis. Teres minor tendon is intact. Mild-moderate subscapularis tendinosis. Muscles: No muscle atrophy or edema. No intramuscular fluid collection or hematoma. Biceps Long Head: Moderate-severe tendinosis of the intra-articular portion of the long head of  the biceps tendon. Acromioclavicular Joint: Mild arthropathy of the acromioclavicular joint. Contrast in the subacromial/subdeltoid bursa. Glenohumeral Joint: Intraarticular contrast distending the joint capsule. Normal glenohumeral ligaments. No chondral  defect. Labrum: Intact. Bones: No fracture or dislocation. No aggressive osseous lesion. Other: No fluid collection or hematoma. IMPRESSION: 1. Complete full-thickness, full width tear of the supraspinatus tendon with 18 mm of retraction. 2. Severe infraspinatus tendinosis. 3. Mild-moderate subscapularis tendinosis. 4. Moderate-severe tendinosis of the intra-articular portion of the long head of the biceps tendon. Electronically Signed   By: Julaine Blanch M.D.   On: 08/09/2023 05:58                 ROS: All systems reviewed are negative as they relate to the chief complaint within the history of present illness.  Patient denies fevers or chills.  Assessment & Plan: Visit Diagnoses:  1. Tear of right supraspinatus tendon   2. Left shoulder pain, unspecified chronicity   3. Adhesive capsulitis of left shoulder     Plan: Amanda Garner is a 53 y.o. female who presents to the office for review of right shoulder and cervical spine MRI scans.  MRI of the right shoulder does demonstrate full-thickness tear of the supraspinatus with retraction but no atrophy.  There is also moderate to severe tendinosis of the long head of the bicep tendon.  Additionally, based on her exam she has developed frozen shoulder of both shoulders; this was the case for the right shoulder several months ago but the left shoulder is now involved as well.  Also reviewed MRI of the cervical spine demonstrating some right sided foraminal narrowing which looks more moderate at C4-C5.  We discussed options available to patient and with her young age, recommended proceeding with surgical intervention for the right shoulder consisting of right shoulder arthroscopy with debridement, mini open bicep tenodesis and rotator cuff tear repair.  May require patch augmentation at the time due to her history of smoking and primarily depending on tissue quality at the time of surgery.  We discussed the risks and benefits of the procedure including but  not limited to the risk of nerve/blood vessel damage, continued shoulder stiffness, recurrent rotator cuff damage in the future, need for revision surgery, infection.  We discussed the recovery timeframe and the time off work.  Also discussed the importance of achieving as much range of motion afterward as possible.  She will likely require manipulation under anesthesia prior to surgical intervention on the day of surgery.  Also plan to set her up for cervical spine ESI with Dr. Eldonna to see what percentage of her symptoms are stemming from the cervical spine.  We discussed frozen shoulder and concept today.  Administered left glenohumeral injection and patient tolerated procedure well without complication.  Home exercise program provided for patient for her left shoulder.  Follow-up after procedure.  She will meet Dr. Addie on the day of surgery and she is okay with this.  Follow-Up Instructions: No follow-ups on file.   Orders:  Orders Placed This Encounter  Procedures   US  Guided Needle Placement - No Linked Charges   Ambulatory referral to Physical Medicine Rehab   Meds ordered this encounter  Medications   traMADol  (ULTRAM ) 50 MG tablet    Sig: Take 1 tablet (50 mg total) by mouth at bedtime as needed.    Dispense:  20 tablet    Refill:  0      Procedures: Large Joint Inj: L glenohumeral on 08/25/2023 12:21  PM Details: 22 G 3.5 in needle, ultrasound-guided posterior approach Medications: 5 mL lidocaine  1 %; 9 mL bupivacaine  0.25 %; 40 mg methylPREDNISolone  acetate 40 MG/ML Outcome: tolerated well, no immediate complications Procedure, treatment alternatives, risks and benefits explained, specific risks discussed. Consent was given by the patient. Patient was prepped and draped in the usual sterile fashion.       Clinical Data: No additional findings.  Objective: Vital Signs: LMP 08/20/2011   Physical Exam:  Constitutional: Patient appears well-developed HEENT:  Head:  Normocephalic Eyes:EOM are normal Neck: Normal range of motion Cardiovascular: Normal rate Pulmonary/chest: Effort normal Neurologic: Patient is alert Skin: Skin is warm Psychiatric: Patient has normal mood and affect  Ortho Exam: Ortho exam demonstrates left shoulder with 50 degrees X rotation, 80 degrees abduction, 90 degrees forward elevation passively and actively.  This compared with the right shoulder with 45 degrees X rotation, 70 degrees abduction, 80 degrees forward elevation both passively and actively.  She has 4/5 strength of infraspinatus and supraspinatus of the right shoulder with 5/5 subscap strength.  5/5 strength of supra, infra, subscap of the left shoulder.  Does have pain with passive motion of both shoulders.  Palpable radial pulse of bilateral upper extremities.  Positive Spurling sign reproducing right radicular pain.  No AC joint tenderness.  Does have tenderness over the bicipital groove of both shoulders, right greater than left.  Positive O'Brien sign of the right shoulder.  There is crepitus noted with passive motion of the right shoulder but not the left.  Axillary nerve is intact with deltoid firing in both shoulders.  Intact EPL, FPL, finger abduction, pronation/supination, bicep, tricep, deltoid bilaterally.  Specialty Comments:  No specialty comments available.  Imaging: No results found.   PMFS History: Patient Active Problem List   Diagnosis Date Noted   Type 2 diabetes mellitus (HCC) 11/05/2021   GERD (gastroesophageal reflux disease) 11/05/2021   S/P laparoscopic-assisted sigmoidectomy 11/04/2021   Abnormal CT scan 08/05/2021   Hx of diverticulitis of colon 08/05/2021   Colovaginal fistula 07/03/2021   Acquired palmar and plantar hyperkeratosis 01/25/2021   Hyperglycemia 02/01/2020   Crohn's disease of large intestine with abscess (HCC) 01/31/2020   Acute diverticulitis 01/09/2020   Diverticulitis of large intestine with abscess without bleeding  06/07/2017   Abscess of sigmoid colon due to diverticulitis 05/24/2017   Tobacco dependence 05/11/2017   Essential hypertension 05/11/2017   Reflux esophagitis 05/11/2017   Tinea pedis of left foot 04/11/2015   C. difficile colitis 08/26/2011   Enteritis 08/25/2011   Past Medical History:  Diagnosis Date   Acid reflux    Blood transfusion    Diverticulitis    Hypertension    Sickle cell trait (HCC)     Family History  Problem Relation Age of Onset   Hypertension Mother    Diabetes Mother    Diabetes type II Mother    Diabetes type II Father    Stroke Father    Lung cancer Father 71   Esophageal cancer Maternal Grandfather    Colon cancer Neg Hx    Pancreatic cancer Neg Hx    Stomach cancer Neg Hx     Past Surgical History:  Procedure Laterality Date   ABDOMINAL HYSTERECTOMY  2001   partial hysterectomy - emergency during last C-section   CESAREAN SECTION  1991; 1997; 2001   CESAREAN SECTION     FLEXIBLE SIGMOIDOSCOPY N/A 11/04/2021   Procedure: FLEXIBLE SIGMOIDOSCOPY;  Surgeon: Teresa Lonni HERO, MD;  Location: [facility];  Service: General;  Laterality: N/A;   XI ROBOTIC ASSISTED LOWER ANTERIOR RESECTION N/A 11/04/2021   Procedure: XI ROBOTIC ASSISTED LOWER ANTERIOR RESECTION, RESECTION OF COLOVAGINAL FISTULA, BILATERAL TAP BLOCK, INJECTION OF FIREFLY;  Surgeon: Teresa Lonni HERO, MD;  Location: [facility];  Service: General;  Laterality: N/A;   Social History   Occupational History   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, [employer]: [employer]    Comment: [employer]  Tobacco Use   Smoking status: Some Days    Current packs/day: 0.50    Average packs/day: 0.5 packs/day for 31.0 years (15.5 ttl pk-yrs)    Types: Cigarettes   Smokeless tobacco: Never  Vaping Use   Vaping status: Never Used  Substance and Sexual Activity   Alcohol use: Yes    Alcohol/week: 1.0 standard drink of alcohol    Types: 1 Standard drinks or equivalent per week     Comment: occassionally   Drug use: Yes    Frequency: 7.0 times per week    Types: Marijuana   Sexual activity: Yes    Partners: Male    Birth control/protection: Surgical

## 2023-08-29 ENCOUNTER — Ambulatory Visit: Payer: 59 | Admitting: Surgical

## 2023-08-31 ENCOUNTER — Other Ambulatory Visit: Payer: Self-pay

## 2023-08-31 ENCOUNTER — Other Ambulatory Visit (HOSPITAL_COMMUNITY): Payer: Self-pay

## 2023-09-12 ENCOUNTER — Encounter: Payer: 59 | Admitting: Surgical

## 2023-09-12 ENCOUNTER — Other Ambulatory Visit: Payer: Self-pay

## 2023-09-14 ENCOUNTER — Other Ambulatory Visit: Payer: Self-pay

## 2023-09-14 ENCOUNTER — Ambulatory Visit: Payer: 59 | Admitting: Physical Medicine and Rehabilitation

## 2023-09-14 DIAGNOSIS — M5412 Radiculopathy, cervical region: Secondary | ICD-10-CM

## 2023-09-14 MED ORDER — METHYLPREDNISOLONE ACETATE 40 MG/ML IJ SUSP
40.0000 mg | Freq: Once | INTRAMUSCULAR | Status: AC
  Administered 2023-09-14: 40 mg

## 2023-09-14 NOTE — Patient Instructions (Signed)

## 2023-09-14 NOTE — Progress Notes (Signed)
Functional Pain Scale - descriptive words and definitions  Uncomfortable (3)  Pain is present but can complete all ADL's/sleep is slightly affected and passive distraction only gives marginal relief. Mild range order  Average Pain 4   +Driver, -BT, -Dye Allergies.

## 2023-09-19 ENCOUNTER — Other Ambulatory Visit: Payer: Self-pay

## 2023-09-20 ENCOUNTER — Telehealth: Payer: Self-pay | Admitting: Surgical

## 2023-09-20 NOTE — Telephone Encounter (Signed)
Pt called requesting a refill of gabapentin. Pt states her surgery was cancelled due to her not having the out of pocket amount to cover surgery. Please send to [facility] on [address]. Pt phone number is [phone].

## 2023-09-21 ENCOUNTER — Other Ambulatory Visit: Payer: Self-pay | Admitting: Surgical

## 2023-09-21 DIAGNOSIS — M791 Myalgia, unspecified site: Secondary | ICD-10-CM

## 2023-09-21 MED ORDER — GABAPENTIN 300 MG PO CAPS: 300.0000 mg | ORAL_CAPSULE | Freq: Every day | ORAL | 3 refills | Status: DC

## 2023-09-21 NOTE — Telephone Encounter (Signed)
I left voicemail for patient advising of refill on medication.

## 2023-09-21 NOTE — Telephone Encounter (Signed)
Sent in refill. Debbie, what's the current plan for surgery for Amanda Garner?

## 2023-09-22 NOTE — Progress Notes (Signed)
Amanda Garner - 53 y.o. female MRN [id number]  Date of birth: 10-Sep-1970  Office Visit Note: Visit Date: 09/14/2023 PCP: Hoy Register, MD Referred by: Hoy Register, MD  Subjective: Chief Complaint  Patient presents with   Neck - Pain   HPI:  Amanda Garner is a 53 y.o. female who comes in today at the request of Karenann Cai, PA-C for planned Right C7-T1 Cervical Interlaminar epidural steroid injection with fluoroscopic guidance.  The patient has failed conservative care including home exercise, medications, time and activity modification.  This injection will be diagnostic and hopefully therapeutic.  Please see requesting physician notes for further details and justification.   ROS Otherwise per HPI.  Assessment & Plan: Visit Diagnoses:    ICD-10-CM   1. Cervical radiculopathy  M54.12 methylPREDNISolone acetate (DEPO-MEDROL) injection 40 mg    XR C-ARM NO REPORT    Epidural Steroid injection      Plan: No additional findings.   Meds & Orders:  Meds ordered this encounter  Medications   methylPREDNISolone acetate (DEPO-MEDROL) injection 40 mg    Orders Placed This Encounter  Procedures   XR C-ARM NO REPORT   Epidural Steroid injection    Follow-up: Return for visit to requesting provider as needed.   Procedures: No procedures performed  Cervical Epidural Steroid Injection - Interlaminar Approach with Fluoroscopic Guidance  Patient: Amanda Garner      Date of Birth: 1971-01-06 MRN: [id number] PCP: Hoy Register, MD      Visit Date: 09/14/2023   Universal Protocol:    Date/Time: 01/30/259:39 AM  Consent Given By: the patient  Position: PRONE  Additional Comments: Vital signs were monitored before and after the procedure. Patient was prepped and draped in the usual sterile fashion. The correct patient, procedure, and site was verified.   Injection Procedure Details:   Procedure diagnoses: Cervical radiculopathy [M54.12]    Meds Administered:   Meds ordered this encounter  Medications   methylPREDNISolone acetate (DEPO-MEDROL) injection 40 mg     Laterality: Right  Location/Site: C7-T1  Needle: 3.5 in., 20 ga. Tuohy  Needle Placement: Paramedian epidural space  Findings:  -Comments: Excellent flow of contrast into the epidural space.  Procedure Details: Using a paramedian approach from the side mentioned above, the region overlying the inferior lamina was localized under fluoroscopic visualization and the soft tissues overlying this structure were infiltrated with 4 ml. of 1% Lidocaine without Epinephrine. A # 20 gauge, Tuohy needle was inserted into the epidural space using a paramedian approach.  The epidural space was localized using loss of resistance along with contralateral oblique bi-planar fluoroscopic views.  After negative aspirate for air, blood, and CSF, a 2 ml. volume of Isovue-250 was injected into the epidural space and the flow of contrast was observed. Radiographs were obtained for documentation purposes.   The injectate was administered into the level noted above.  Additional Comments:  No complications occurred Dressing: 2 x 2 sterile gauze and Band-Aid    Post-procedure details: Patient was observed during the procedure. Post-procedure instructions were reviewed.  Patient left the clinic in stable condition.   Clinical History: CLINICAL DATA:  Neck pain radiating into the right shoulder and right arm for 3 months. History of a fall.   EXAM: MRI CERVICAL SPINE WITHOUT CONTRAST   TECHNIQUE: Multiplanar, multisequence MR imaging of the cervical spine was performed. No intravenous contrast was administered.   COMPARISON:  None Available.   FINDINGS: Alignment: There is reversal of the normal cervical lordosis centered  about the C5-6 level. No listhesis.   Vertebrae: No fracture, evidence of discitis, or bone lesion.   Cord: Normal signal throughout.   Posterior Fossa, vertebral  arteries, paraspinal tissues: Negative.   Disc levels:   C2-3: Negative.   C3-4: Shallow disc bulge.  No stenosis.   C4-5: A left paracentral disc protrusion contacts the ventral cord. Mild right foraminal narrowing is present. The left foramen is open.   C5-6: There is a disc osteophyte complex and some uncovertebral spurring. Mild flattening of the ventral cord due to bursal lordosis and spondylosis is present. Mild left foraminal narrowing. The right foramen is open.   C6-7: Shallow disc bulge to the right without stenosis.   C7-T1: Negative.   IMPRESSION: 1. Left paracentral disc protrusion at C4-5 contacts the ventral cord. Mild right foraminal narrowing is present at this level. 2. Reversal of the normal cervical lordosis centered about C5-6 where there is a disc osteophyte complex and uncovertebral spurring. Mild flattening of the ventral cord and mild left foraminal narrowing are present at this level.     Electronically Signed   By: Drusilla Kanner M.D.   On: 08/20/2023 11:39     Objective:  VS:  HT:    WT:   BMI:     BP:   HR: bpm  TEMP: ( )  RESP:  Physical Exam Vitals and nursing note reviewed.  Constitutional:      General: She is not in acute distress.    Appearance: Normal appearance. She is not ill-appearing.  HENT:     Head: Normocephalic and atraumatic.     Right Ear: External ear normal.     Left Ear: External ear normal.  Eyes:     Extraocular Movements: Extraocular movements intact.  Cardiovascular:     Rate and Rhythm: Normal rate.     Pulses: Normal pulses.  Musculoskeletal:     Cervical back: Tenderness present. No rigidity.     Right lower leg: No edema.     Left lower leg: No edema.     Comments: Patient has good strength in the upper extremities including 5 out of 5 strength in wrist extension long finger flexion and APB.  There is no atrophy of the hands intrinsically.  There is a negative Hoffmann's test.   Lymphadenopathy:      Cervical: No cervical adenopathy.  Skin:    Findings: No erythema, lesion or rash.  Neurological:     General: No focal deficit present.     Mental Status: She is alert and oriented to person, place, and time.     Sensory: No sensory deficit.     Motor: No weakness or abnormal muscle tone.     Coordination: Coordination normal.  Psychiatric:        Mood and Affect: Mood normal.        Behavior: Behavior normal.      Imaging: No results found.

## 2023-09-22 NOTE — Procedures (Signed)
Cervical Epidural Steroid Injection - Interlaminar Approach with Fluoroscopic Guidance  Patient: Amanda Garner      Date of Birth: 09/27/1970 MRN: [id number] PCP: Hoy Register, MD      Visit Date: 09/14/2023   Universal Protocol:    Date/Time: 01/30/259:39 AM  Consent Given By: the patient  Position: PRONE  Additional Comments: Vital signs were monitored before and after the procedure. Patient was prepped and draped in the usual sterile fashion. The correct patient, procedure, and site was verified.   Injection Procedure Details:   Procedure diagnoses: Cervical radiculopathy [M54.12]    Meds Administered:  Meds ordered this encounter  Medications   methylPREDNISolone acetate (DEPO-MEDROL) injection 40 mg     Laterality: Right  Location/Site: C7-T1  Needle: 3.5 in., 20 ga. Tuohy  Needle Placement: Paramedian epidural space  Findings:  -Comments: Excellent flow of contrast into the epidural space.  Procedure Details: Using a paramedian approach from the side mentioned above, the region overlying the inferior lamina was localized under fluoroscopic visualization and the soft tissues overlying this structure were infiltrated with 4 ml. of 1% Lidocaine without Epinephrine. A # 20 gauge, Tuohy needle was inserted into the epidural space using a paramedian approach.  The epidural space was localized using loss of resistance along with contralateral oblique bi-planar fluoroscopic views.  After negative aspirate for air, blood, and CSF, a 2 ml. volume of Isovue-250 was injected into the epidural space and the flow of contrast was observed. Radiographs were obtained for documentation purposes.   The injectate was administered into the level noted above.  Additional Comments:  No complications occurred Dressing: 2 x 2 sterile gauze and Band-Aid    Post-procedure details: Patient was observed during the procedure. Post-procedure instructions were reviewed.  Patient  left the clinic in stable condition.

## 2023-09-23 ENCOUNTER — Encounter (HOSPITAL_COMMUNITY): Payer: Self-pay | Admitting: *Deleted

## 2023-09-23 ENCOUNTER — Other Ambulatory Visit: Payer: Self-pay

## 2023-09-23 ENCOUNTER — Ambulatory Visit (HOSPITAL_COMMUNITY)
Admission: EM | Admit: 2023-09-23 | Discharge: 2023-09-23 | Disposition: A | Discharge status: Home / Self Care | Payer: 59 | Attending: Physician Assistant | Admitting: Physician Assistant

## 2023-09-23 DIAGNOSIS — J069 Acute upper respiratory infection, unspecified: Secondary | ICD-10-CM | POA: Insufficient documentation

## 2023-09-23 LAB — POCT INFLUENZA A/B
Influenza A, POC: NEGATIVE
Influenza B, POC: NEGATIVE

## 2023-09-23 MED ORDER — IBUPROFEN 800 MG PO TABS
800.0000 mg | ORAL_TABLET | Freq: Once | ORAL | Status: AC
  Administered 2023-09-23: 800 mg via ORAL

## 2023-09-23 MED ORDER — PREDNISONE 20 MG PO TABS: 40.0000 mg | ORAL_TABLET | Freq: Every day | ORAL | 0 refills | Status: AC

## 2023-09-23 MED ORDER — IBUPROFEN 800 MG PO TABS
ORAL_TABLET | ORAL | Status: AC
  Filled 2023-09-23: qty 1

## 2023-09-23 NOTE — Discharge Instructions (Addendum)
Your flu testing was negative.  We are still awaiting the results of your COVID testing and we will keep you updated once those results are back.  Based on your described symptoms and the duration of symptoms it is likely that you have a viral upper respiratory infection (often called a "cold")  Symptoms can last for 3-10 days with lingering cough and intermittent symptoms lasting weeks after that.  The goal of treatment at this time is to reduce your symptoms and discomfort   I recommend using Robitussin and Mucinex (regular formulations, nothing with decongestants or DM)  You can also use Tylenol for body aches and fever reduction I also recommend adding an antihistamine to your daily regimen This includes medications like Claritin, Allegra, Zyrtec- the generics of these work very well and are usually less expensive I recommend using Flonase nasal spray - 2 puffs twice per day to help with your nasal congestion The antihistamines and Flonase can take a few weeks to provide significant relief from allergy symptoms but should start to provide some benefit soon. You can use a humidifier at night to help with preventing nasal dryness and irritation   I have sent in a script for Prednisone taper to be taken in the morning with breakfast per the instructions on the container Remember that steroids can cause sleeplessness, irritability, increased hunger and elevated glucose levels so be mindful of these side effects. They should lessen as you progress to the lower doses of the taper.  If your symptoms do not improve or become worse in the next 5-7 days please make an apt at the office so we can see you  Go to the ER if you begin to have more serious symptoms such as shortness of breath, trouble breathing, loss of consciousness, swelling around the eyes, high fever, severe lasting headaches, vision changes or neck pain/stiffness.

## 2023-09-23 NOTE — ED Provider Notes (Addendum)
[facility]    CSN: [id number] Arrival date & time: 09/23/23  1806      History   Chief Complaint Chief Complaint  Patient presents with   Generalized Body Aches   Headache    HPI Amanda Garner is a 53 y.o. female.   HPI  She reports she has been feeling bad since yesterday  She reports she had some SOB last night   She is not sure if there have been sick contacts prior to her feeling ill Interventions: Alkaseltzer, Nyquil, she states she took 2 of her husband's prednisone, herbal teas    Past Medical History:  Diagnosis Date   Acid reflux    Blood transfusion    Diverticulitis    Hypertension    Sickle cell trait [facility])     Patient Active Problem List   Diagnosis Date Noted   Type 2 diabetes mellitus (HCC) 11/05/2021   GERD (gastroesophageal reflux disease) 11/05/2021   S/P laparoscopic-assisted sigmoidectomy 11/04/2021   Abnormal CT scan 08/05/2021   Hx of diverticulitis of colon 08/05/2021   Colovaginal fistula 07/03/2021   Acquired palmar and plantar hyperkeratosis 01/25/2021   Hyperglycemia 02/01/2020   Crohn's disease of large intestine with abscess (HCC) 01/31/2020   Acute diverticulitis 01/09/2020   Diverticulitis of large intestine with abscess without bleeding 06/07/2017   Abscess of sigmoid colon due to diverticulitis 05/24/2017   Tobacco dependence 05/11/2017   Essential hypertension 05/11/2017   Reflux esophagitis 05/11/2017   Tinea pedis of left foot 04/11/2015   C. difficile colitis 08/26/2011   Enteritis 08/25/2011    Past Surgical History:  Procedure Laterality Date   ABDOMINAL HYSTERECTOMY  2001   partial hysterectomy - emergency during last C-section   CESAREAN SECTION  1991; 1997; 2001   CESAREAN SECTION     FLEXIBLE SIGMOIDOSCOPY N/A 11/04/2021   Procedure: FLEXIBLE SIGMOIDOSCOPY;  Surgeon: Andria Meuse, MD;  Location: [facility];  Service: General;  Laterality: N/A;   XI ROBOTIC ASSISTED LOWER ANTERIOR RESECTION  N/A 11/04/2021   Procedure: XI ROBOTIC ASSISTED LOWER ANTERIOR RESECTION, RESECTION OF COLOVAGINAL FISTULA, BILATERAL TAP BLOCK, INJECTION OF FIREFLY;  Surgeon: Andria Meuse, MD;  Location: [facility];  Service: General;  Laterality: N/A;    OB History   No obstetric history on file.      Home Medications    Prior to Admission medications   Medication Sig Start Date End Date Taking? Authorizing Provider  Accu-Chek FastClix Lancets MISC Use as directed to check blood glucose once a day before breakfast. 12/21/21  Yes Newlin, Enobong, MD  albuterol (VENTOLIN HFA) 108 (90 Base) MCG/ACT inhaler Inhale 2 puffs into the lungs every 4 (four) hours as needed for wheezing or shortness of breath. 10/22/22  Yes Georgian Co M, PA-C  amLODipine (NORVASC) 10 MG tablet Take 1 tablet (10 mg total) by mouth daily. 05/26/23 05/25/24 Yes Hoy Register, MD  Blood Glucose Monitoring Suppl (BLOOD GLUCOSE MONITOR SYSTEM) w/Device KIT Use as directed to check blood glucose once a day before breakfast. 11/06/21  Yes Narda Bonds, MD  buPROPion (WELLBUTRIN XL) 150 MG 24 hr tablet Take 1 tablet (150 mg total) by mouth daily. For smoking cessation 05/26/23  Yes Hoy Register, MD  gabapentin (NEURONTIN) 300 MG capsule Take 1 capsule (300 mg total) by mouth at bedtime. 09/21/23  Yes Magnant, Charles L, PA-C  glucose blood test strip Use to test blood glucose daily before breakfast. 11/06/21  Yes Narda Bonds, MD  nicotine (  NICODERM CQ) 7 mg/24hr patch Place 1 patch (7 mg total) onto the skin daily. 05/26/23  Yes Hoy Register, MD  predniSONE (DELTASONE) 20 MG tablet Take 2 tablets (40 mg total) by mouth daily for 5 days. 09/23/23 09/28/23 Yes Noga Fogg E, PA-C  terbinafine (LAMISIL) 250 MG tablet Take 1 tablet (250 mg total) by mouth daily. 08/19/21  Yes Regal, Kirstie Peri, DPM  tirzepatide [facility]) 12.5 MG/0.5ML Pen Inject 12.5 mg into the skin once a week. For 4 weeks then increase to 15mg  05/26/23  Yes Newlin,  Enobong, MD  traMADol (ULTRAM) 50 MG tablet Take 1 tablet (50 mg total) by mouth at bedtime as needed. 08/25/23 08/24/24 Yes Magnant, Charles L, PA-C  atorvastatin (LIPITOR) 20 MG tablet Take 1 tablet (20 mg total) by mouth daily. 05/26/23   Hoy Register, MD  benzonatate (TESSALON) 200 MG capsule Take 1 capsule (200 mg total) by mouth 3 (three) times daily. 10/22/22   Anders Simmonds, PA-C  esomeprazole (NEXIUM) 40 MG capsule Take 1 capsule (40 mg total) by mouth daily. Patient not taking: Reported on 07/19/2023 05/26/23   Hoy Register, MD  famotidine (PEPCID) 40 MG tablet Take 1 tablet (40 mg total) by mouth at bedtime. Patient not taking: Reported on 07/19/2023 05/26/23   Hoy Register, MD  naproxen (NAPROSYN) 500 MG tablet TAKE 1 TABLET(500 MG) BY MOUTH TWICE DAILY WITH A MEAL 06/24/22   Hoy Register, MD  Semaglutide, 2 MG/DOSE, 8 MG/3ML SOPN Inject 2 mg as directed once a week. Patient not taking: Reported on 07/19/2023 04/20/23   Hoy Register, MD  tirzepatide [facility]) 15 MG/0.5ML Pen Inject 15 mg into the skin once a week. 05/26/23   Hoy Register, MD    Family History Family History  Problem Relation Age of Onset   Hypertension Mother    Diabetes Mother    Diabetes type II Mother    Diabetes type II Father    Stroke Father    Lung cancer Father 56   Esophageal cancer Maternal Grandfather    Colon cancer Neg Hx    Pancreatic cancer Neg Hx    Stomach cancer Neg Hx     Social History Social History   Tobacco Use   Smoking status: Some Days    Current packs/day: 0.50    Average packs/day: 0.5 packs/day for 31.0 years (15.5 ttl pk-yrs)    Types: Cigarettes   Smokeless tobacco: Never  Vaping Use   Vaping status: Never Used  Substance Use Topics   Alcohol use: Yes    Alcohol/week: 1.0 standard drink of alcohol    Types: 1 Standard drinks or equivalent per week    Comment: occassionally   Drug use: Yes    Frequency: 7.0 times per week    Types: Marijuana      Allergies   Patient has no known allergies.   Review of Systems Review of Systems  Constitutional:  Positive for chills, fatigue and fever.  HENT:  Positive for congestion, rhinorrhea and sore throat. Negative for postnasal drip.   Respiratory:  Positive for cough. Negative for shortness of breath and wheezing.   Gastrointestinal:  Positive for nausea. Negative for diarrhea and vomiting.  Musculoskeletal:  Positive for myalgias.  Neurological:  Positive for headaches.     Physical Exam Triage Vital Signs ED Triage Vitals  Encounter Vitals Group     BP 09/23/23 1907 (!) 129/90     Systolic BP Percentile --      Diastolic BP Percentile --  Pulse Rate 09/23/23 1907 (!) 127     Resp 09/23/23 1907 20     Temp 09/23/23 1907 (!) 102.5 F (39.2 C)     Temp src --      SpO2 09/23/23 1907 92 %     Weight --      Height --      Head Circumference --      Peak Flow --      Pain Score 09/23/23 1903 9     Pain Loc --      Pain Education --      Exclude from Growth Chart --    No data found.  Updated Vital Signs BP (!) 129/90   Pulse (!) 127   Temp (!) 102.5 F (39.2 C)   Resp 20   LMP 08/20/2011   SpO2 92%   Visual Acuity Right Eye Distance:   Left Eye Distance:   Bilateral Distance:    Right Eye Near:   Left Eye Near:    Bilateral Near:     Physical Exam Vitals reviewed.  Constitutional:      General: She is awake.     Appearance: She is well-developed and well-groomed. She is ill-appearing.  HENT:     Head: Normocephalic and atraumatic.     Right Ear: Hearing, tympanic membrane and ear canal normal.     Left Ear: A middle ear effusion is present.     Mouth/Throat:     Lips: Pink.     Mouth: Mucous membranes are moist.     Pharynx: Uvula midline. Posterior oropharyngeal erythema present. No oropharyngeal exudate or postnasal drip.  Cardiovascular:     Rate and Rhythm: Regular rhythm. Tachycardia present.     Heart sounds: Normal heart sounds.   Pulmonary:     Effort: Pulmonary effort is normal.     Breath sounds: Normal breath sounds. No decreased air movement. No decreased breath sounds, wheezing, rhonchi or rales.  Lymphadenopathy:     Head:     Right side of head: No submental, submandibular or preauricular adenopathy.     Left side of head: No submental, submandibular or preauricular adenopathy.     Cervical:     Right cervical: No superficial cervical adenopathy.    Left cervical: No superficial cervical adenopathy.     Upper Body:     Right upper body: No supraclavicular adenopathy.     Left upper body: No supraclavicular adenopathy.  Neurological:     Mental Status: She is alert.  Psychiatric:        Attention and Perception: Attention normal.        Mood and Affect: Mood normal.        Speech: Speech normal.        Behavior: Behavior normal. Behavior is cooperative.      UC Treatments / Results  Labs (all labs ordered are listed, but only abnormal results are displayed) Labs Reviewed  POCT INFLUENZA A/B - Normal  POC SARS CORONAVIRUS 2 AG -  ED    EKG   Radiology No results found.  Procedures Procedures (including critical care time)  Medications Ordered in UC Medications  ibuprofen (ADVIL) tablet 800 mg (800 mg Oral Given 09/23/23 1955)    Initial Impression / Assessment and Plan / UC Course  I have reviewed the triage vital signs and the nursing notes.  Pertinent labs & imaging results that were available during my care of the patient were reviewed by me and considered  in my medical decision making (see chart for details).      Final Clinical Impressions(s) / UC Diagnoses   Final diagnoses:  Viral upper respiratory tract infection   Acute, new concern Patient presents today with symptoms comprised of body aches, headaches, coughing, shortness of breath, fevers Patient was given 800 mg of ibuprofen in the clinic today for fever and bodyaches.  Reviewed kidney function prior to  administration.  eGFR is greater than 60, she does not appear to be on any anticoagulants and denies being told that she should not take NSAIDs. Flu testing was negative.  Still awaiting COVID results At this time recommend treating with symptomatic management with over-the-counter medications per her preference. Will send in prednisone burst for 5 days to assist with shortness of breath. Reviewed ED and return precautions-provided in after visit summary Follow-up as needed for progressing or persistent symptoms   Discharge Instructions      Your flu testing was negative.  We are still awaiting the results of your COVID testing and we will keep you updated once those results are back.  Based on your described symptoms and the duration of symptoms it is likely that you have a viral upper respiratory infection (often called a "cold")  Symptoms can last for 3-10 days with lingering cough and intermittent symptoms lasting weeks after that.  The goal of treatment at this time is to reduce your symptoms and discomfort   I recommend using Robitussin and Mucinex (regular formulations, nothing with decongestants or DM)  You can also use Tylenol for body aches and fever reduction I also recommend adding an antihistamine to your daily regimen This includes medications like Claritin, Allegra, Zyrtec- the generics of these work very well and are usually less expensive I recommend using Flonase nasal spray - 2 puffs twice per day to help with your nasal congestion The antihistamines and Flonase can take a few weeks to provide significant relief from allergy symptoms but should start to provide some benefit soon. You can use a humidifier at night to help with preventing nasal dryness and irritation   I have sent in a script for Prednisone taper to be taken in the morning with breakfast per the instructions on the container Remember that steroids can cause sleeplessness, irritability, increased hunger  and elevated glucose levels so be mindful of these side effects. They should lessen as you progress to the lower doses of the taper.  If your symptoms do not improve or become worse in the next 5-7 days please make an apt at the office so we can see you  Go to the ER if you begin to have more serious symptoms such as shortness of breath, trouble breathing, loss of consciousness, swelling around the eyes, high fever, severe lasting headaches, vision changes or neck pain/stiffness.       ED Prescriptions     Medication Sig Dispense Auth. Provider   predniSONE (DELTASONE) 20 MG tablet Take 2 tablets (40 mg total) by mouth daily for 5 days. 10 tablet Ledon Weihe E, PA-C      PDMP not reviewed this encounter.   Syncere Eble, Oswaldo Conroy, PA-C 09/23/23 2007    Roselind Messier 09/23/23 2008

## 2023-09-23 NOTE — ED Triage Notes (Signed)
Pt reports since yesterday she has had a HA,body aches ,fever . Pt has tried OTC with out relief.

## 2023-09-23 NOTE — ED Triage Notes (Signed)
PT took tylenol 4 hrs ago for fever

## 2023-09-24 LAB — SARS CORONAVIRUS 2 (TAT 6-24 HRS): SARS Coronavirus 2: NEGATIVE

## 2023-09-27 NOTE — Telephone Encounter (Signed)
That sounds like a good plan.  Just let me know if you need anything from me.

## 2023-10-04 ENCOUNTER — Encounter (HOSPITAL_COMMUNITY): Payer: Self-pay

## 2023-10-04 ENCOUNTER — Ambulatory Visit (HOSPITAL_COMMUNITY)
Admission: EM | Admit: 2023-10-04 | Discharge: 2023-10-04 | Disposition: A | Discharge status: Home / Self Care | Payer: 59 | Attending: Physician Assistant | Admitting: Physician Assistant

## 2023-10-04 DIAGNOSIS — J40 Bronchitis, not specified as acute or chronic: Secondary | ICD-10-CM

## 2023-10-04 DIAGNOSIS — B349 Viral infection, unspecified: Secondary | ICD-10-CM

## 2023-10-04 MED ORDER — ONDANSETRON 4 MG PO TBDP: 4.0000 mg | ORAL_TABLET | Freq: Three times a day (TID) | ORAL | 0 refills | Status: AC | PRN

## 2023-10-04 MED ORDER — AZITHROMYCIN 250 MG PO TABS: ORAL_TABLET | ORAL | 0 refills | Status: DC

## 2023-10-04 MED ORDER — ALBUTEROL SULFATE HFA 108 (90 BASE) MCG/ACT IN AERS: 1.0000 | INHALATION_SPRAY | Freq: Four times a day (QID) | RESPIRATORY_TRACT | 1 refills | Status: DC | PRN

## 2023-10-04 MED ORDER — FLUTICASONE-SALMETEROL 250-50 MCG/ACT IN AEPB: 1.0000 | INHALATION_SPRAY | Freq: Two times a day (BID) | RESPIRATORY_TRACT | 0 refills | Status: DC

## 2023-10-04 NOTE — ED Provider Notes (Signed)
[facility]    CSN: [id number] Arrival date & time: 10/04/23  1440      History   Chief Complaint Chief Complaint  Patient presents with   Cough   Generalized Body Aches    HPI Amanda Garner is a 53 y.o. female.   HPI  Patient was seen 09/23/2023 for similar symptoms and treated with prednisone burst.  She was also taking over-the-counter medications such as Alka-Seltzer, Mucinex, ibuprofen, Tylenol.  She has been using her inhaler as directed.  She states she still has body aches, cough, chest pain and back pain from coughing She reports she still has diarrhea and reduced appetite    Past Medical History:  Diagnosis Date   Acid reflux    Blood transfusion    Diverticulitis    Hypertension    Sickle cell trait [facility])     Patient Active Problem List   Diagnosis Date Noted   Type 2 diabetes mellitus (HCC) 11/05/2021   GERD (gastroesophageal reflux disease) 11/05/2021   S/P laparoscopic-assisted sigmoidectomy 11/04/2021   Abnormal CT scan 08/05/2021   Hx of diverticulitis of colon 08/05/2021   Colovaginal fistula 07/03/2021   Acquired palmar and plantar hyperkeratosis 01/25/2021   Hyperglycemia 02/01/2020   Crohn's disease of large intestine with abscess (HCC) 01/31/2020   Acute diverticulitis 01/09/2020   Diverticulitis of large intestine with abscess without bleeding 06/07/2017   Abscess of sigmoid colon due to diverticulitis 05/24/2017   Tobacco dependence 05/11/2017   Essential hypertension 05/11/2017   Reflux esophagitis 05/11/2017   Tinea pedis of left foot 04/11/2015   C. difficile colitis 08/26/2011   Enteritis 08/25/2011    Past Surgical History:  Procedure Laterality Date   ABDOMINAL HYSTERECTOMY  2001   partial hysterectomy - emergency during last C-section   CESAREAN SECTION  1991; 1997; 2001   CESAREAN SECTION     FLEXIBLE SIGMOIDOSCOPY N/A 11/04/2021   Procedure: FLEXIBLE SIGMOIDOSCOPY;  Surgeon: Andria Meuse, MD;   Location: [facility];  Service: General;  Laterality: N/A;   XI ROBOTIC ASSISTED LOWER ANTERIOR RESECTION N/A 11/04/2021   Procedure: XI ROBOTIC ASSISTED LOWER ANTERIOR RESECTION, RESECTION OF COLOVAGINAL FISTULA, BILATERAL TAP BLOCK, INJECTION OF FIREFLY;  Surgeon: Andria Meuse, MD;  Location: [facility];  Service: General;  Laterality: N/A;    OB History   No obstetric history on file.      Home Medications    Prior to Admission medications   Medication Sig Start Date End Date Taking? Authorizing Provider  Accu-Chek FastClix Lancets MISC Use as directed to check blood glucose once a day before breakfast. 12/21/21  Yes Newlin, Enobong, MD  albuterol (VENTOLIN HFA) 108 (90 Base) MCG/ACT inhaler Inhale 1-2 puffs into the lungs every 6 (six) hours as needed for wheezing or shortness of breath. 10/04/23  Yes Myelle Poteat E, PA-C  amLODipine (NORVASC) 10 MG tablet Take 1 tablet (10 mg total) by mouth daily. 05/26/23 05/25/24 Yes Hoy Register, MD  azithromycin (ZITHROMAX) 250 MG tablet Take 500mg  PO daily x1d and then 250mg  daily x4 days 10/04/23  Yes Avriana Joo E, PA-C  Blood Glucose Monitoring Suppl (BLOOD GLUCOSE MONITOR SYSTEM) w/Device KIT Use as directed to check blood glucose once a day before breakfast. 11/06/21  Yes Narda Bonds, MD  fluticasone-salmeterol (WIXELA INHUB) 250-50 MCG/ACT AEPB Inhale 1 puff into the lungs in the morning and at bedtime. 10/04/23  Yes Caris Cerveny E, PA-C  gabapentin (NEURONTIN) 300 MG capsule Take 1 capsule (300 mg total)  by mouth at bedtime. 09/21/23  Yes Magnant, Charles L, PA-C  glucose blood test strip Use to test blood glucose daily before breakfast. 11/06/21  Yes Narda Bonds, MD  naproxen (NAPROSYN) 500 MG tablet TAKE 1 TABLET(500 MG) BY MOUTH TWICE DAILY WITH A MEAL 06/24/22  Yes Newlin, Enobong, MD  nicotine (NICODERM CQ) 7 mg/24hr patch Place 1 patch (7 mg total) onto the skin daily. 05/26/23  Yes Hoy Register, MD  ondansetron (ZOFRAN-ODT) 4 MG  disintegrating tablet Take 1 tablet (4 mg total) by mouth every 8 (eight) hours as needed for nausea or vomiting. 10/04/23  Yes Mandy Fitzwater E, PA-C  tirzepatide (MOUNJARO) 15 MG/0.5ML Pen Inject 15 mg into the skin once a week. 05/26/23  Yes Hoy Register, MD  traMADol (ULTRAM) 50 MG tablet Take 1 tablet (50 mg total) by mouth at bedtime as needed. 08/25/23 08/24/24 Yes Magnant, Joycie Peek, PA-C    Family History Family History  Problem Relation Age of Onset   Hypertension Mother    Diabetes Mother    Diabetes type II Mother    Diabetes type II Father    Stroke Father    Lung cancer Father 41   Esophageal cancer Maternal Grandfather    Colon cancer Neg Hx    Pancreatic cancer Neg Hx    Stomach cancer Neg Hx     Social History Social History   Tobacco Use   Smoking status: Some Days    Current packs/day: 0.50    Average packs/day: 0.5 packs/day for 31.0 years (15.5 ttl pk-yrs)    Types: Cigarettes   Smokeless tobacco: Never  Vaping Use   Vaping status: Never Used  Substance Use Topics   Alcohol use: Yes    Alcohol/week: 1.0 standard drink of alcohol    Types: 1 Standard drinks or equivalent per week    Comment: occassionally   Drug use: Yes    Frequency: 7.0 times per week    Types: Marijuana     Allergies   Patient has no known allergies.   Review of Systems Review of Systems  Constitutional:  Positive for appetite change, chills and fatigue. Negative for fever.  HENT:  Negative for congestion, postnasal drip and sore throat.   Respiratory:  Positive for cough, shortness of breath and wheezing.   Gastrointestinal:  Positive for diarrhea and nausea. Negative for vomiting.  Musculoskeletal:  Positive for myalgias.     Physical Exam Triage Vital Signs ED Triage Vitals  Encounter Vitals Group     BP 10/04/23 1625 (!) 161/99     Systolic BP Percentile --      Diastolic BP Percentile --      Pulse Rate 10/04/23 1625 (!) 104     Resp 10/04/23 1625 18     Temp  10/04/23 1625 98.1 F (36.7 C)     Temp Source 10/04/23 1625 Oral     SpO2 10/04/23 1625 95 %     Weight 10/04/23 1625 190 lb (86.2 kg)     Height 10/04/23 1625 5\' 6"  (1.676 m)     Head Circumference --      Peak Flow --      Pain Score 10/04/23 1623 9     Pain Loc --      Pain Education --      Exclude from Growth Chart --    No data found.  Updated Vital Signs BP (!) 161/99 (BP Location: Right Wrist)   Pulse (!) 104  Temp 98.1 F (36.7 C) (Oral)   Resp 18   Ht 5\' 6"  (1.676 m)   Wt 190 lb (86.2 kg)   LMP 08/20/2011   SpO2 95%   BMI 30.67 kg/m   Visual Acuity Right Eye Distance:   Left Eye Distance:   Bilateral Distance:    Right Eye Near:   Left Eye Near:    Bilateral Near:     Physical Exam Vitals reviewed.  Constitutional:      General: She is awake.     Appearance: Normal appearance. She is well-developed and well-groomed.  HENT:     Head: Normocephalic and atraumatic.     Right Ear: Hearing, tympanic membrane and ear canal normal.     Left Ear: Hearing, tympanic membrane and ear canal normal.     Mouth/Throat:     Lips: Pink.     Mouth: Mucous membranes are moist.     Pharynx: Oropharynx is clear. Uvula midline. No pharyngeal swelling, oropharyngeal exudate, posterior oropharyngeal erythema, uvula swelling or postnasal drip.  Cardiovascular:     Rate and Rhythm: Regular rhythm. Tachycardia present.     Heart sounds: Normal heart sounds. No murmur heard.    No friction rub. No gallop.  Pulmonary:     Effort: Pulmonary effort is normal.     Breath sounds: Normal breath sounds. Decreased air movement present. No decreased breath sounds, wheezing, rhonchi or rales.     Comments: Chest sounds tight   Musculoskeletal:     Cervical back: Normal range of motion and neck supple.  Lymphadenopathy:     Head:     Right side of head: No submental, submandibular or preauricular adenopathy.     Left side of head: No submental, submandibular or preauricular  adenopathy.     Cervical:     Right cervical: No superficial cervical adenopathy.    Left cervical: No superficial cervical adenopathy.  Neurological:     Mental Status: She is alert.  Psychiatric:        Behavior: Behavior is cooperative.      UC Treatments / Results  Labs (all labs ordered are listed, but only abnormal results are displayed) Labs Reviewed - No data to display  EKG   Radiology No results found.  Procedures Procedures (including critical care time)  Medications Ordered in UC Medications - No data to display  Initial Impression / Assessment and Plan / UC Course  I have reviewed the triage vital signs and the nursing notes.  Pertinent labs & imaging results that were available during my care of the patient were reviewed by me and considered in my medical decision making (see chart for details).      Final Clinical Impressions(s) / UC Diagnoses   Final diagnoses:  Bronchitis  Viral illness   Acute, ongoing concern Patient reports that she was seen here about a week ago and is still having bodyaches, dry cough, back and chest pain from the coughing.  She has been taking over-the-counter medications and was prescribed a prednisone burst.  She states the prednisone seemed to help with the fever but she is still having coughing and bodyaches.  At this time I suspect she likely has bronchitis and will treat with a Z-Pak, Advair twice daily, refill albuterol inhaler.  She can continue to use over-the-counter Tylenol and ibuprofen as needed for body aches and pains.  Will also send in Zofran to assist with nausea.  Recommend that she stays hydrated and rest as able.  ED and return precautions reviewed and provided in after visit summary.  Follow-up as needed for progressing or persistent symptoms    Discharge Instructions      At this time I suspect that you likely have bronchitis.  This is an inflammation of the airways in your lungs.  I have sent in an  inhaler for you to take twice per day every day for at least 2 weeks.  This should help calm the inflammation and decrease your coughing.  I have also sent in a refill of your albuterol inhaler.  You can continue to use this as needed for coughing fits or shortness of breath.  In addition to these I have sent in a medication called azithromycin or a Z-Pak.  This is an antibiotic that also helps reduce inflammation in the lungs.  Please take as directed for 5 days.  To help with your nausea I have sent in a medication called Zofran.  You can use this up to every 8 hours as needed for nausea or vomiting.  Please make sure you are staying well-hydrated and try to eat small portions of bland meals until you are feeling better.  You can continue to use Tylenol and ibuprofen as needed for body aches and fever management.  If you start to develop trouble breathing, shortness of breath, chest pain, fevers that are not responding to over-the-counter medications, lethargy, loss of consciousness please go to the emergency room immediately.     ED Prescriptions     Medication Sig Dispense Auth. Provider   azithromycin (ZITHROMAX) 250 MG tablet Take 500mg  PO daily x1d and then 250mg  daily x4 days 6 each Jazmeen Axtell E, PA-C   ondansetron (ZOFRAN-ODT) 4 MG disintegrating tablet Take 1 tablet (4 mg total) by mouth every 8 (eight) hours as needed for nausea or vomiting. 20 tablet Lametria Klunk E, PA-C   fluticasone-salmeterol (WIXELA INHUB) 250-50 MCG/ACT AEPB Inhale 1 puff into the lungs in the morning and at bedtime. 60 each Ilsa Bonello E, PA-C   albuterol (VENTOLIN HFA) 108 (90 Base) MCG/ACT inhaler Inhale 1-2 puffs into the lungs every 6 (six) hours as needed for wheezing or shortness of breath. 8 g Hazelene Doten E, PA-C      PDMP not reviewed this encounter.   Tyeler Goedken, Oswaldo Conroy, PA-C 10/04/23 1708

## 2023-10-04 NOTE — Discharge Instructions (Addendum)
At this time I suspect that you likely have bronchitis.  This is an inflammation of the airways in your lungs.  I have sent in an inhaler for you to take twice per day every day for at least 2 weeks.  This should help calm the inflammation and decrease your coughing.  I have also sent in a refill of your albuterol inhaler.  You can continue to use this as needed for coughing fits or shortness of breath.  In addition to these I have sent in a medication called azithromycin or a Z-Pak.  This is an antibiotic that also helps reduce inflammation in the lungs.  Please take as directed for 5 days.  To help with your nausea I have sent in a medication called Zofran.  You can use this up to every 8 hours as needed for nausea or vomiting.  Please make sure you are staying well-hydrated and try to eat small portions of bland meals until you are feeling better.  You can continue to use Tylenol and ibuprofen as needed for body aches and fever management.  If you start to develop trouble breathing, shortness of breath, chest pain, fevers that are not responding to over-the-counter medications, lethargy, loss of consciousness please go to the emergency room immediately.

## 2023-10-04 NOTE — ED Triage Notes (Signed)
Chief Complaint: Patient was seen a week ago for the same symptoms. Still having body aches, dry cough, mid back to chest pain.   Sick exposure: Yes- Patient was sick first but now the entire house is sick.  Onset: 3 weeks  Prescriptions or OTC medications tried: Yes- Steroids, Alka-seltzer cold, Mucinex, Ibuprofen, Tylenol, and an inhaler    with mild relief  New foods, medications, or products: No  Recent Travel: No

## 2023-10-17 ENCOUNTER — Ambulatory Visit: Payer: 59

## 2023-10-17 ENCOUNTER — Other Ambulatory Visit: Payer: Self-pay

## 2023-10-24 ENCOUNTER — Ambulatory Visit: Payer: 59

## 2023-10-25 ENCOUNTER — Ambulatory Visit: Payer: 59 | Attending: Family Medicine

## 2023-10-25 ENCOUNTER — Telehealth: Payer: Self-pay | Admitting: Family Medicine

## 2023-10-25 DIAGNOSIS — Z0000 Encounter for general adult medical examination without abnormal findings: Secondary | ICD-10-CM

## 2023-10-25 NOTE — Telephone Encounter (Signed)
Medical clearance letter written 

## 2023-10-25 NOTE — Progress Notes (Signed)
 Patient in today for EKG for medical clearance.

## 2023-10-26 ENCOUNTER — Other Ambulatory Visit: Payer: Self-pay

## 2023-10-31 ENCOUNTER — Other Ambulatory Visit: Payer: Self-pay

## 2023-11-04 ENCOUNTER — Other Ambulatory Visit: Payer: Self-pay

## 2023-11-06 ENCOUNTER — Emergency Department (HOSPITAL_COMMUNITY)

## 2023-11-06 ENCOUNTER — Encounter (HOSPITAL_COMMUNITY): Payer: Self-pay | Admitting: Emergency Medicine

## 2023-11-06 ENCOUNTER — Emergency Department (HOSPITAL_COMMUNITY)
Admission: EM | Admit: 2023-11-06 | Discharge: 2023-11-06 | Disposition: A | Discharge status: Home / Self Care | Attending: Emergency Medicine | Admitting: Emergency Medicine

## 2023-11-06 ENCOUNTER — Other Ambulatory Visit: Payer: Self-pay

## 2023-11-06 DIAGNOSIS — J449 Chronic obstructive pulmonary disease, unspecified: Secondary | ICD-10-CM | POA: Diagnosis not present

## 2023-11-06 DIAGNOSIS — J40 Bronchitis, not specified as acute or chronic: Secondary | ICD-10-CM | POA: Diagnosis not present

## 2023-11-06 DIAGNOSIS — R0602 Shortness of breath: Secondary | ICD-10-CM | POA: Diagnosis present

## 2023-11-06 DIAGNOSIS — I10 Essential (primary) hypertension: Secondary | ICD-10-CM | POA: Insufficient documentation

## 2023-11-06 DIAGNOSIS — Z79899 Other long term (current) drug therapy: Secondary | ICD-10-CM | POA: Insufficient documentation

## 2023-11-06 LAB — COMPREHENSIVE METABOLIC PANEL
ALT: 12 U/L (ref 0–44)
AST: 17 U/L (ref 15–41)
Albumin: 3.4 g/dL — ABNORMAL LOW (ref 3.5–5.0)
Alkaline Phosphatase: 65 U/L (ref 38–126)
Anion gap: 10 (ref 5–15)
BUN: 9 mg/dL (ref 6–20)
CO2: 22 mmol/L (ref 22–32)
Calcium: 9 mg/dL (ref 8.9–10.3)
Chloride: 109 mmol/L (ref 98–111)
Creatinine, Ser: 0.67 mg/dL (ref 0.44–1.00)
GFR, Estimated: >60 mL/min (ref >60)
Glucose, Bld: 142 mg/dL — ABNORMAL HIGH (ref 70–99)
Potassium: 3.5 mmol/L (ref 3.5–5.1)
Sodium: 141 mmol/L (ref 135–145)
Total Bilirubin: 0.8 mg/dL (ref 0.0–1.2)
Total Protein: 6.6 g/dL (ref 6.5–8.1)

## 2023-11-06 LAB — CBC
HCT: 39.4 % (ref 36.0–46.0)
Hemoglobin: 12.7 g/dL (ref 12.0–15.0)
MCH: 22.2 pg — ABNORMAL LOW (ref 26.0–34.0)
MCHC: 32.2 g/dL (ref 30.0–36.0)
MCV: 69 fL — ABNORMAL LOW (ref 80.0–100.0)
Platelets: 229 10*3/uL (ref 150–400)
RBC: 5.71 MIL/uL — ABNORMAL HIGH (ref 3.87–5.11)
RDW: 15.6 % — ABNORMAL HIGH (ref 11.5–15.5)
WBC: 7.3 10*3/uL (ref 4.0–10.5)
nRBC: 0 % (ref 0.0–0.2)

## 2023-11-06 LAB — RESP PANEL BY RT-PCR (RSV, FLU A&B, COVID)  RVPGX2
Influenza A by PCR: NEGATIVE
Influenza B by PCR: NEGATIVE
Resp Syncytial Virus by PCR: NEGATIVE
SARS Coronavirus 2 by RT PCR: NEGATIVE

## 2023-11-06 LAB — D-DIMER, QUANTITATIVE: D-Dimer, Quant: 0.28 ug{FEU}/mL (ref 0.00–0.50)

## 2023-11-06 LAB — TROPONIN I (HIGH SENSITIVITY): Troponin I (High Sensitivity): 5 ng/L (ref <18)

## 2023-11-06 LAB — BRAIN NATRIURETIC PEPTIDE: B Natriuretic Peptide: 24.6 pg/mL (ref 0.0–100.0)

## 2023-11-06 MED ORDER — AZITHROMYCIN 250 MG PO TABS: 500.0000 mg | ORAL_TABLET | Freq: Every day | ORAL | 0 refills | Status: DC

## 2023-11-06 MED ORDER — IPRATROPIUM-ALBUTEROL 0.5-2.5 (3) MG/3ML IN SOLN
3.0000 mL | Freq: Once | RESPIRATORY_TRACT | Status: AC
  Administered 2023-11-06: 3 mL via RESPIRATORY_TRACT
  Filled 2023-11-06: qty 3

## 2023-11-06 MED ORDER — BENZONATATE 100 MG PO CAPS
100.0000 mg | ORAL_CAPSULE | Freq: Once | ORAL | Status: AC
  Administered 2023-11-06: 100 mg via ORAL
  Filled 2023-11-06: qty 1

## 2023-11-06 MED ORDER — METHYLPREDNISOLONE SODIUM SUCC 125 MG IJ SOLR
125.0000 mg | Freq: Once | INTRAMUSCULAR | Status: AC
  Administered 2023-11-06: 125 mg via INTRAVENOUS
  Filled 2023-11-06: qty 2

## 2023-11-06 MED ORDER — PREDNISONE 20 MG PO TABS: 40.0000 mg | ORAL_TABLET | Freq: Every day | ORAL | 0 refills | Status: DC

## 2023-11-06 MED ORDER — ALBUTEROL SULFATE (2.5 MG/3ML) 0.083% IN NEBU
2.5000 mg | INHALATION_SOLUTION | Freq: Once | RESPIRATORY_TRACT | Status: AC
  Administered 2023-11-06: 2.5 mg via RESPIRATORY_TRACT
  Filled 2023-11-06: qty 3

## 2023-11-06 NOTE — ED Notes (Signed)
 Pt ambulated independently on room air. O2 stayed between 95 and 100.

## 2023-11-06 NOTE — ED Triage Notes (Signed)
 Pt here from home with c/o sob over the last 2 days , pt does smoke and has asthma

## 2023-11-06 NOTE — Discharge Instructions (Addendum)
 Please follow-up with your primary care doctor for further evaluation.  I suspect shortness of breath secondary to COPD.  I sent steroids to your pharmacy please take as prescribed.  Vital signs optimizing which is an antibiotic please take as prescribed.  Use was albuterol inhaler at home for wheezing or shortness of breath.  Return to emergency room if you have any new or worsening symptoms.  1000 mg Tylenol every 6 hours as needed for muscle aches or fever.

## 2023-11-06 NOTE — ED Provider Notes (Signed)
 [place] EMERGENCY DEPARTMENT AT [facility] Provider Note   CSN: [id number] Arrival date & time: 11/06/23  0865     History  Chief Complaint  Patient presents with   Shortness of Breath    Amanda Garner is a 53 y.o. female with past medical history of tobacco dependence, GERD, hypertension, self-reported history of COPD presenting to emergency room with complaint of shortness of breath over the last several days. She reports several months of SOB on exertion, worse when walking up stairs.  Patient reports that she has had dry cough, muscle aches, fever, shortness of breath.  Reports fever yesterday was 100.2 although she is unsure.  She has some chest tightness which is worse with taking a deep breath in.  She has tried albuterol inhaler at home without any improvement in shortness of breath.  She has not noted any swelling in her feet and ankles.  Denies any abdominal pain nausea vomiting diarrhea or urinary symptoms.  Patient reports she smokes 5 to 8 cigarettes/day.  She has been smoking since she was 20.   Shortness of Breath Associated symptoms: wheezing        Home Medications Prior to Admission medications   Medication Sig Start Date End Date Taking? Authorizing Provider  Accu-Chek FastClix Lancets MISC Use as directed to check blood glucose once a day before breakfast. 12/21/21   Hoy Register, MD  albuterol (VENTOLIN HFA) 108 (90 Base) MCG/ACT inhaler Inhale 1-2 puffs into the lungs every 6 (six) hours as needed for wheezing or shortness of breath. 10/04/23   Mecum, Erin E, PA-C  amLODipine (NORVASC) 10 MG tablet Take 1 tablet (10 mg total) by mouth daily. 05/26/23 05/25/24  Hoy Register, MD  azithromycin (ZITHROMAX) 250 MG tablet Take 500mg  PO daily x1d and then 250mg  daily x4 days 10/04/23   Mecum, Erin E, PA-C  Blood Glucose Monitoring Suppl (BLOOD GLUCOSE MONITOR SYSTEM) w/Device KIT Use as directed to check blood glucose once a day before breakfast.  11/06/21   Narda Bonds, MD  fluticasone-salmeterol (WIXELA INHUB) 250-50 MCG/ACT AEPB Inhale 1 puff into the lungs in the morning and at bedtime. 10/04/23   Mecum, Erin E, PA-C  gabapentin (NEURONTIN) 300 MG capsule Take 1 capsule (300 mg total) by mouth at bedtime. 09/21/23   Magnant, Charles L, PA-C  glucose blood test strip Use to test blood glucose daily before breakfast. 11/06/21   Narda Bonds, MD  naproxen (NAPROSYN) 500 MG tablet TAKE 1 TABLET(500 MG) BY MOUTH TWICE DAILY WITH A MEAL 06/24/22   Hoy Register, MD  nicotine (NICODERM CQ) 7 mg/24hr patch Place 1 patch (7 mg total) onto the skin daily. 05/26/23   Hoy Register, MD  ondansetron (ZOFRAN-ODT) 4 MG disintegrating tablet Take 1 tablet (4 mg total) by mouth every 8 (eight) hours as needed for nausea or vomiting. 10/04/23   Mecum, Erin E, PA-C  tirzepatide [facility]) 15 MG/0.5ML Pen Inject 15 mg into the skin once a week. 05/26/23   Hoy Register, MD  traMADol (ULTRAM) 50 MG tablet Take 1 tablet (50 mg total) by mouth at bedtime as needed. 08/25/23 08/24/24  Magnant, Joycie Peek, PA-C      Allergies    Patient has no known allergies.    Review of Systems   Review of Systems  Respiratory:  Positive for shortness of breath and wheezing.     Physical Exam Updated Vital Signs BP (!) 165/118 (BP Location: Left Arm)   Pulse (!) 105  Temp 97.8 F (36.6 C) (Oral)   Resp (!) 22   LMP 08/20/2011   SpO2 97%  Physical Exam Vitals and nursing note reviewed.  Constitutional:      General: She is not in acute distress.    Appearance: She is not toxic-appearing.  HENT:     Head: Normocephalic and atraumatic.  Eyes:     General: No scleral icterus.    Conjunctiva/sclera: Conjunctivae normal.  Cardiovascular:     Rate and Rhythm: Normal rate and regular rhythm.     Pulses: Normal pulses.     Heart sounds: Normal heart sounds.  Pulmonary:     Effort: Pulmonary effort is normal. No respiratory distress.     Breath sounds:  Rhonchi present.  Chest:     Chest wall: No tenderness.  Abdominal:     General: Abdomen is flat. Bowel sounds are normal.     Palpations: Abdomen is soft.     Tenderness: There is no abdominal tenderness.  Skin:    General: Skin is warm and dry.     Findings: No lesion.  Neurological:     General: No focal deficit present.     Mental Status: She is alert and oriented to person, place, and time. Mental status is at baseline.     ED Results / Procedures / Treatments   Labs (all labs ordered are listed, but only abnormal results are displayed) Labs Reviewed  COMPREHENSIVE METABOLIC PANEL - Abnormal; Notable for the following components:      Result Value   Glucose, Bld 142 (*)    Albumin 3.4 (*)    All other components within normal limits  CBC - Abnormal; Notable for the following components:   RBC 5.71 (*)    MCV 69.0 (*)    MCH 22.2 (*)    RDW 15.6 (*)    All other components within normal limits  RESP PANEL BY RT-PCR (RSV, FLU A&B, COVID)  RVPGX2  D-DIMER, QUANTITATIVE  BRAIN NATRIURETIC PEPTIDE  TROPONIN I (HIGH SENSITIVITY)    EKG EKG Interpretation Date/Time:  Sunday November 06 2023 08:25:53 EDT Ventricular Rate:  95 PR Interval:  144 QRS Duration:  80 QT Interval:  367 QTC Calculation: 459 R Axis:   56  Text Interpretation: Sinus rhythm LVH with secondary repolarization abnormality Confirmed by Vonita Moss [phone]) on 11/06/2023 8:30:58 AM  Radiology DG Chest 2 View Result Date: 11/06/2023 CLINICAL DATA:  Short of breath over the last 2 days. EXAM: CHEST - 2 VIEW COMPARISON:  08/30/2022 and older studies. FINDINGS: Normal heart, mediastinum and hila. Lungs are clear.  No pleural effusion or pneumothorax. Skeletal structures are intact. IMPRESSION: No active cardiopulmonary disease. Electronically Signed   By: Amie Portland M.D.   On: 11/06/2023 10:11    Procedures Procedures    Medications Ordered in ED Medications  methylPREDNISolone sodium  succinate (SOLU-MEDROL) 125 mg/2 mL injection 125 mg (125 mg Intravenous Given 11/06/23 0827)  ipratropium-albuterol (DUONEB) 0.5-2.5 (3) MG/3ML nebulizer solution 3 mL (3 mLs Nebulization Given 11/06/23 0827)    ED Course/ Medical Decision Making/ A&P Clinical Course as of 11/06/23 1129  Sun Nov 06, 2023  1038 Ambulated without hypoxia, ambulates with steady gait but gets quite short of breath and increased RR.  [JB]    Clinical Course User Index [JB] Maliik Karner, Horald Chestnut, PA-C  Medical Decision Making Amount and/or Complexity of Data Reviewed Labs: ordered. Radiology: ordered.  Risk Prescription drug management.   Rolena Knutson 53 y.o. presented today for shortness of breath.  Working DDx that I considered at this time includes, but not limited to, asthma/COPD exacerbation, URI, viral illness, anemia, ACS, PE, pneumonia, pleural effusion, lung mass.  R/o DDx: These are considered less likely due to history of present illness, physical exam, labs/imaging findings  Pmhx: tobacco dependence, GERD, hypertension, self-reported history of COPD  Review of prior external notes: 10/04/23 visit   Unique Tests and My Interpretation:  CBC without leukocytosis and no anemia.  CMP with no electrolyte abnormality.  She has normal kidney function.  No elevation in liver enzymes.  Troponin is 5 will not repeat due to duration of symptoms.  Respiratory panel negative Dimer negative BNP negative   EKG shows sinus rhythm with LVH.  Chest x-ray--- negative    Problem List / ED Course / Critical interventions / Medication management  Presenting to emergency room with worsening shortness of breath.  Patient reports over the last several days she has had dry cough, congestion and worsening shortness of breath with exertion.  She denies any specific chest pain however has had some chest tightness.  I will will check troponin and EKG to evaluate for ASC.  Will also obtain  D-dimer to rule out pulmonary embolism.  Obtaining chest x-ray to rule out any acute abnormality like pneumonia.  She does have end wheezing on exam. No sign of fluid overload on exam, but will obtain BNP due to DOE.  She is hemodynamically stable and well-appearing.  She has not tachypneic and not hypoxic.  Given physical exam we will try DuoNeb and steroids as I suspect that this could be COPD. Patient with negative troponin, negative dimer and normal BNP. No findings on chest x-ray. She is feeling better after steroid and breathing treatment. Will ambulate with pulse ox. Ambulated with pulse ox, normal RR, normal O2 sat, feeling better. Has inhaler with her. Stable for d/c I ordered medication including Duo Neb, Albuterol, Steroids  Reevaluation of the patient after these medicines showed that the patient improved Patients vitals assessed. Upon arrival patient is hemodynamically stable.  I have reviewed the patients home medicines and have made adjustments as needed   Plan:  F/u w/ PCP in 2-3d to ensure resolution of sx.  Patient was given return precautions. Patient stable for discharge at this time.  Patient educated on sx/dx and verbalized understanding of plan. Will return to ER for new or worsening sx.          Final Clinical Impression(s) / ED Diagnoses Final diagnoses:  Bronchitis    Rx / DC Orders ED Discharge Orders          Ordered    predniSONE (DELTASONE) 20 MG tablet  Daily        11/06/23 1137    azithromycin (ZITHROMAX) 250 MG tablet  Daily        11/06/23 1137              Taytem Ghattas, Horald Chestnut, PA-C 11/06/23 1233    Rondel Baton, MD 11/06/23 [phone]

## 2023-11-18 ENCOUNTER — Other Ambulatory Visit: Payer: Self-pay | Admitting: Physician Assistant

## 2023-11-18 NOTE — Telephone Encounter (Signed)
 Requested medications are due for refill today.  unsure  Requested medications are on the active medications list.  yes  Last refill. 09/27/2023 8 1 rf  Future visit scheduled.   yes  Notes to clinic.  Rx signed by Jacquelin Hawking    Requested Prescriptions  Pending Prescriptions Disp Refills   albuterol (VENTOLIN HFA) 108 (90 Base) MCG/ACT inhaler [Pharmacy Med Name: ALBUTEROL HFA (PROAIR) INHALER] 8.5 each 1    Sig: INHALE 1-2 PUFFS BY MOUTH EVERY 6 HOURS AS NEEDED FOR WHEEZE OR SHORTNESS OF BREATH     Pulmonology:  Beta Agonists 2 Failed - 11/18/2023  5:38 PM      Failed - Last BP in normal range    BP Readings from Last 1 Encounters:  11/06/23 (!) 143/81         Passed - Last Heart Rate in normal range    Pulse Readings from Last 1 Encounters:  11/06/23 99         Passed - Valid encounter within last 12 months    Recent Outpatient Visits           5 months ago Type 2 diabetes mellitus with other specified complication, without long-term current use of insulin (HCC)   [facility] - [facility] Dept Of [place]. [facility] Hoy Register, MD   1 year ago Type 2 diabetes mellitus with other specified complication, without long-term current use of insulin (HCC)   [facility] - A Dept Of [place]. [facility] [place], [place], [place]   1 year ago Type 2 diabetes mellitus with other specified complication, without long-term current use of insulin (HCC)   [facility] - [facility] Dept Of [place]. [facility] [place], [place] W, NP   1 year ago Type 2 diabetes mellitus with other specified complication, without long-term current use of insulin (HCC)   [facility] - A Dept Of [place]. [facility] Hoy Register, MD   1 year ago Encounter for well woman exam with routine gynecological exam   [facility] - A Dept Of [place]. [facility] Hoy Register, MD        Future Appointments             In 1 week Hoy Register, MD [facility] - A Dept Of [place]. [facility]

## 2023-11-21 ENCOUNTER — Other Ambulatory Visit: Payer: Self-pay

## 2023-11-21 ENCOUNTER — Emergency Department (HOSPITAL_COMMUNITY)

## 2023-11-21 ENCOUNTER — Ambulatory Visit: Payer: Self-pay | Admitting: *Deleted

## 2023-11-21 ENCOUNTER — Observation Stay (HOSPITAL_COMMUNITY)
Admission: EM | Admit: 2023-11-21 | Discharge: 2023-11-23 | Disposition: A | Discharge status: Home / Self Care | Attending: Internal Medicine | Admitting: Internal Medicine

## 2023-11-21 ENCOUNTER — Inpatient Hospital Stay (HOSPITAL_COMMUNITY)

## 2023-11-21 DIAGNOSIS — E872 Acidosis, unspecified: Secondary | ICD-10-CM | POA: Insufficient documentation

## 2023-11-21 DIAGNOSIS — I4581 Long QT syndrome: Secondary | ICD-10-CM | POA: Diagnosis not present

## 2023-11-21 DIAGNOSIS — G629 Polyneuropathy, unspecified: Secondary | ICD-10-CM | POA: Insufficient documentation

## 2023-11-21 DIAGNOSIS — J209 Acute bronchitis, unspecified: Secondary | ICD-10-CM | POA: Diagnosis not present

## 2023-11-21 DIAGNOSIS — F17200 Nicotine dependence, unspecified, uncomplicated: Secondary | ICD-10-CM | POA: Diagnosis not present

## 2023-11-21 DIAGNOSIS — R9431 Abnormal electrocardiogram [ECG] [EKG]: Secondary | ICD-10-CM | POA: Diagnosis present

## 2023-11-21 DIAGNOSIS — E1169 Type 2 diabetes mellitus with other specified complication: Secondary | ICD-10-CM

## 2023-11-21 DIAGNOSIS — E1165 Type 2 diabetes mellitus with hyperglycemia: Secondary | ICD-10-CM | POA: Diagnosis not present

## 2023-11-21 DIAGNOSIS — I10 Essential (primary) hypertension: Secondary | ICD-10-CM | POA: Insufficient documentation

## 2023-11-21 DIAGNOSIS — F17210 Nicotine dependence, cigarettes, uncomplicated: Secondary | ICD-10-CM | POA: Insufficient documentation

## 2023-11-21 DIAGNOSIS — R0602 Shortness of breath: Secondary | ICD-10-CM | POA: Diagnosis present

## 2023-11-21 DIAGNOSIS — K219 Gastro-esophageal reflux disease without esophagitis: Secondary | ICD-10-CM | POA: Diagnosis not present

## 2023-11-21 DIAGNOSIS — E041 Nontoxic single thyroid nodule: Secondary | ICD-10-CM | POA: Diagnosis not present

## 2023-11-21 DIAGNOSIS — E119 Type 2 diabetes mellitus without complications: Secondary | ICD-10-CM

## 2023-11-21 DIAGNOSIS — J441 Chronic obstructive pulmonary disease with (acute) exacerbation: Secondary | ICD-10-CM | POA: Diagnosis not present

## 2023-11-21 LAB — HEPATIC FUNCTION PANEL
ALT: 24 U/L (ref 0–44)
AST: 23 U/L (ref 15–41)
Albumin: 3.9 g/dL (ref 3.5–5.0)
Alkaline Phosphatase: 80 U/L (ref 38–126)
Bilirubin, Direct: 0.2 mg/dL (ref 0.0–0.2)
Indirect Bilirubin: 0.3 mg/dL (ref 0.3–0.9)
Total Bilirubin: 0.5 mg/dL (ref 0.0–1.2)
Total Protein: 7 g/dL (ref 6.5–8.1)

## 2023-11-21 LAB — PHOSPHORUS: Phosphorus: 2.8 mg/dL (ref 2.5–4.6)

## 2023-11-21 LAB — BASIC METABOLIC PANEL WITH GFR
Anion gap: 10 (ref 5–15)
BUN: 12 mg/dL (ref 6–20)
CO2: 20 mmol/L — ABNORMAL LOW (ref 22–32)
Calcium: 9.6 mg/dL (ref 8.9–10.3)
Chloride: 114 mmol/L — ABNORMAL HIGH (ref 98–111)
Creatinine, Ser: 0.81 mg/dL (ref 0.44–1.00)
GFR, Estimated: >60 mL/min (ref >60)
Glucose, Bld: 126 mg/dL — ABNORMAL HIGH (ref 70–99)
Potassium: 3.6 mmol/L (ref 3.5–5.1)
Sodium: 144 mmol/L (ref 135–145)

## 2023-11-21 LAB — BLOOD GAS, VENOUS
Acid-base deficit: 4.4 mmol/L — ABNORMAL HIGH (ref 0.0–2.0)
Bicarbonate: 20.2 mmol/L (ref 20.0–28.0)
O2 Saturation: 85.8 %
Patient temperature: 37
pCO2, Ven: 35 mmHg — ABNORMAL LOW (ref 44–60)
pH, Ven: 7.37 (ref 7.25–7.43)
pO2, Ven: 53 mmHg — ABNORMAL HIGH (ref 32–45)

## 2023-11-21 LAB — CBC WITH DIFFERENTIAL/PLATELET
Abs Immature Granulocytes: 0.04 K/uL (ref 0.00–0.07)
Basophils Absolute: 0.1 K/uL (ref 0.0–0.1)
Basophils Relative: 1 %
Eosinophils Absolute: 0.8 K/uL — ABNORMAL HIGH (ref 0.0–0.5)
Eosinophils Relative: 9 %
HCT: 42.8 % (ref 36.0–46.0)
Hemoglobin: 13.5 g/dL (ref 12.0–15.0)
Immature Granulocytes: 0 %
Lymphocytes Relative: 40 %
Lymphs Abs: 3.7 K/uL (ref 0.7–4.0)
MCH: 22.1 pg — ABNORMAL LOW (ref 26.0–34.0)
MCHC: 31.5 g/dL (ref 30.0–36.0)
MCV: 70 fL — ABNORMAL LOW (ref 80.0–100.0)
Monocytes Absolute: 0.6 K/uL (ref 0.1–1.0)
Monocytes Relative: 6 %
Neutro Abs: 4.1 K/uL (ref 1.7–7.7)
Neutrophils Relative %: 44 %
Platelets: 282 K/uL (ref 150–400)
RBC: 6.11 MIL/uL — ABNORMAL HIGH (ref 3.87–5.11)
RDW: 15.8 % — ABNORMAL HIGH (ref 11.5–15.5)
WBC: 9.3 K/uL (ref 4.0–10.5)
nRBC: 0 % (ref 0.0–0.2)

## 2023-11-21 LAB — RESP PANEL BY RT-PCR (RSV, FLU A&B, COVID)  RVPGX2
Influenza A by PCR: NEGATIVE
Influenza B by PCR: NEGATIVE
Resp Syncytial Virus by PCR: NEGATIVE
SARS Coronavirus 2 by RT PCR: NEGATIVE

## 2023-11-21 LAB — LACTIC ACID, PLASMA: Lactic Acid, Venous: 4.1 mmol/L (ref 0.5–1.9)

## 2023-11-21 LAB — CK: Total CK: 173 U/L (ref 38–234)

## 2023-11-21 LAB — HEMOGLOBIN A1C
Hgb A1c MFr Bld: 5.5 % (ref 4.8–5.6)
Mean Plasma Glucose: 111.15 mg/dL

## 2023-11-21 LAB — PROCALCITONIN: Procalcitonin: <0.1 ng/mL

## 2023-11-21 LAB — MAGNESIUM: Magnesium: 2 mg/dL (ref 1.7–2.4)

## 2023-11-21 LAB — TSH: TSH: 0.547 u[IU]/mL (ref 0.350–4.500)

## 2023-11-21 LAB — CBG MONITORING, ED: Glucose-Capillary: 210 mg/dL — ABNORMAL HIGH (ref 70–99)

## 2023-11-21 MED ORDER — ACETAMINOPHEN 500 MG PO TABS
1000.0000 mg | ORAL_TABLET | Freq: Once | ORAL | Status: AC
  Administered 2023-11-21: 1000 mg via ORAL
  Filled 2023-11-21: qty 2

## 2023-11-21 MED ORDER — KETOROLAC TROMETHAMINE 15 MG/ML IJ SOLN
15.0000 mg | Freq: Once | INTRAMUSCULAR | Status: AC
  Administered 2023-11-21: 15 mg via INTRAVENOUS
  Filled 2023-11-21: qty 1

## 2023-11-21 MED ORDER — SODIUM CHLORIDE 0.9 % IV BOLUS
1000.0000 mL | Freq: Once | INTRAVENOUS | Status: AC
  Administered 2023-11-21: 1000 mL via INTRAVENOUS

## 2023-11-21 MED ORDER — PANTOPRAZOLE SODIUM 40 MG PO TBEC
40.0000 mg | DELAYED_RELEASE_TABLET | Freq: Every day | ORAL | Status: DC
  Administered 2023-11-23: 40 mg via ORAL
  Filled 2023-11-21: qty 1

## 2023-11-21 MED ORDER — IOHEXOL 350 MG/ML SOLN
75.0000 mL | Freq: Once | INTRAVENOUS | Status: AC | PRN
  Administered 2023-11-21: 75 mL via INTRAVENOUS

## 2023-11-21 MED ORDER — INSULIN ASPART 100 UNIT/ML IJ SOLN
0.0000 [IU] | Freq: Every day | INTRAMUSCULAR | Status: DC
Start: 2023-11-21 — End: 2023-11-23
  Administered 2023-11-21: 2 [IU] via SUBCUTANEOUS

## 2023-11-21 MED ORDER — ALBUTEROL SULFATE (2.5 MG/3ML) 0.083% IN NEBU
20.0000 mg | INHALATION_SOLUTION | Freq: Once | RESPIRATORY_TRACT | Status: AC
  Administered 2023-11-21: 20 mg via RESPIRATORY_TRACT
  Filled 2023-11-21 (×2): qty 24

## 2023-11-21 MED ORDER — MAGNESIUM SULFATE 2 GM/50ML IV SOLN
2.0000 g | Freq: Once | INTRAVENOUS | Status: AC
  Administered 2023-11-21: 2 g via INTRAVENOUS
  Filled 2023-11-21: qty 50

## 2023-11-21 MED ORDER — INSULIN ASPART 100 UNIT/ML IJ SOLN
0.0000 [IU] | Freq: Three times a day (TID) | INTRAMUSCULAR | Status: DC
  Administered 2023-11-22: 5 [IU] via SUBCUTANEOUS
  Administered 2023-11-23: 2 [IU] via SUBCUTANEOUS
  Administered 2023-11-23: 3 [IU] via SUBCUTANEOUS

## 2023-11-21 MED ORDER — METHYLPREDNISOLONE SODIUM SUCC 125 MG IJ SOLR
125.0000 mg | Freq: Once | INTRAMUSCULAR | Status: AC
  Administered 2023-11-21: 125 mg via INTRAVENOUS
  Filled 2023-11-21: qty 2

## 2023-11-21 MED ORDER — IPRATROPIUM-ALBUTEROL 0.5-2.5 (3) MG/3ML IN SOLN
3.0000 mL | Freq: Once | RESPIRATORY_TRACT | Status: AC
  Administered 2023-11-21: 3 mL via RESPIRATORY_TRACT
  Filled 2023-11-21: qty 3

## 2023-11-21 NOTE — H&P (Signed)
 Amanda Garner JOA:[id number] DOB: 03/02/1971 DOA: 11/21/2023     PCP: Hoy Register, MD    Patient arrived to ER on 11/21/23 at 1423 Referred by Attending Glyn Ade, MD   Patient coming from:    home Lives  With family    Chief Complaint:   Chief Complaint  Patient presents with   Shortness of Breath    HPI: Amanda Garner is a 53 y.o. female with medical history significant of  hypertension, GERD, Crohn's disease, diverticulitis tobacco abuse    Presented with  shortness of breath Patient has been having bronchitis for the past week States that now her cough diastolic blood and she has been more short of breath no blood in sputum no fever States cough is nonproductive Patient was seen on 16th in ER was given steroids and albuterol inhaler but her cough persisted she has been using albuterol every time she coughs states she used it about 50 times in the past 24 hours Very short of breath on arrival no nausea no vomiting She finished her steroids COVID-negative influenza negative   Rep[orts steroids were helping for about a week and then she got sick again   No sick contacts  Symptoms of OSA at home    Denies significant ETOH intake  not on every day basis but heavy on the weekend drinks a bout a 5th Does  smoke  but interested in quitting smokes 5-10 cis a day Smokes marijuana   Lab Results  Component Value Date   SARSCOV2NAA NEGATIVE 11/21/2023   SARSCOV2NAA NEGATIVE 11/06/2023   SARSCOV2NAA NEGATIVE 09/23/2023   SARSCOV2NAA NEGATIVE 11/02/2021    Regarding pertinent Chronic problems:       HTN on NOrvasc  obesity-   BMI Readings from Last 1 Encounters:  11/21/23 30.60 kg/m     COPD - not  followed by pulmonology   not  on baseline oxygen       DM2 - on [place] While in ER:   Continuous nebulizer treatment but still has significant shortness of breath    Lab Orders         Resp panel by RT-PCR (RSV, Flu A&B, Covid) Anterior Nasal  Swab         Basic metabolic panel         CBC with Differential       CXR -  NON acute    CTA chest - Bronchial wall thickening and air trapping compatible with bronchitis/reactive airways.  thyroid nodule, parathyroid adenoma, or enlarged lymph node. Recommend further evaluation with nonemergent thyroid ultrasound.  Following Medications were ordered in ER: Medications  ipratropium-albuterol (DUONEB) 0.5-2.5 (3) MG/3ML nebulizer solution 3 mL (3 mLs Nebulization Given 11/21/23 1503)  magnesium sulfate IVPB 2 g 50 mL (0 g Intravenous Stopped 11/21/23 1915)  albuterol (PROVENTIL) (2.5 MG/3ML) 0.083% nebulizer solution 20 mg (20 mg Nebulization Given 11/21/23 1800)  methylPREDNISolone sodium succinate (SOLU-MEDROL) 125 mg/2 mL injection 125 mg (125 mg Intravenous Given 11/21/23 1821)  acetaminophen (TYLENOL) tablet 1,000 mg (1,000 mg Oral Given 11/21/23 2005)  ketorolac (TORADOL) 15 MG/ML injection 15 mg (15 mg Intravenous Given 11/21/23 2006)       ED Triage Vitals  Encounter Vitals Group     BP 11/21/23 1455 130/84     Systolic BP Percentile --      Diastolic BP Percentile --      Pulse Rate 11/21/23 1455 (!) 115     Resp 11/21/23 1455 (!) 24  Temp 11/21/23 1455 97.8 F (36.6 C)     Temp Source 11/21/23 1951 Oral     SpO2 11/21/23 1455 95 %     Weight 11/21/23 1458 189 lb 9.5 oz (86 kg)     Height 11/21/23 1458 5\' 6"  (1.676 m)     Head Circumference --      Peak Flow --      Pain Score 11/21/23 1457 9     Pain Loc --      Pain Education --      Exclude from Growth Chart --   NWGN(56)@     _________________________________________ Significant initial  Findings: Abnormal Labs Reviewed  BASIC METABOLIC PANEL WITH GFR - Abnormal; Notable for the following components:      Result Value   Chloride 114 (*)    CO2 20 (*)    Glucose, Bld 126 (*)    All other components within normal limits  CBC WITH DIFFERENTIAL/PLATELET - Abnormal; Notable for the following components:    RBC 6.11 (*)    MCV 70.0 (*)    MCH 22.1 (*)    RDW 15.8 (*)    Eosinophils Absolute 0.8 (*)    All other components within normal limits      _________________________ Troponin  ordered Cardiac Panel (last 3 results) No results for input(s): "CKTOTAL", "CKMB", "TROPONINIHS", "RELINDX" in the last 72 hours.   ECG: Ordered Personally reviewed and interpreted by me showing: HR : 101 Rhythm: Sinus tachycardia Probable left atrial enlargement Nonspecific T abnormalities, lateral leads Borderline prolonged QT interval QTC 506  BNP (last 3 results) Recent Labs    11/06/23 0830  BNP 24.6     COVID-19 Labs  No results for input(s): "DDIMER", "FERRITIN", "LDH", "CRP" in the last 72 hours.  Lab Results  Component Value Date   SARSCOV2NAA NEGATIVE 11/21/2023   SARSCOV2NAA NEGATIVE 11/06/2023   SARSCOV2NAA NEGATIVE 09/23/2023   SARSCOV2NAA NEGATIVE 11/02/2021    The recent clinical data is shown below. Vitals:   11/21/23 1458 11/21/23 1900 11/21/23 1951 11/21/23 2100  BP:  129/87  121/72  Pulse:  (!) 102  (!) 115  Resp:  (!) 24  (!) 21  Temp:   98.4 F (36.9 C)   TempSrc:   Oral   SpO2:  99%  91%  Weight: 86 kg     Height: 5\' 6"  (1.676 m)       WBC     Component Value Date/Time   WBC 9.3 11/21/2023 1530   LYMPHSABS 3.7 11/21/2023 1530   LYMPHSABS 2.8 01/31/2020 0953   MONOABS 0.6 11/21/2023 1530   EOSABS 0.8 (H) 11/21/2023 1530   EOSABS 0.3 01/31/2020 0953   BASOSABS 0.1 11/21/2023 1530   BASOSABS 0.0 01/31/2020 0953    Lactic Acid, Venous      Component Value Date/Time   LATICACIDVEN 4.1 (HH) 11/21/2023 2212    Procalcitonin   Ordered      Results for orders placed or performed during the hospital encounter of 11/21/23  Resp panel by RT-PCR (RSV, Flu A&B, Covid) Anterior Nasal Swab     Status: None   Collection Time: 11/21/23  6:27 PM   Specimen: Anterior Nasal Swab  Result Value Ref Range Status   SARS Coronavirus 2 by RT PCR NEGATIVE NEGATIVE  Final   Influenza A by PCR NEGATIVE NEGATIVE Final   Influenza B by PCR NEGATIVE NEGATIVE Final         Resp Syncytial Virus by PCR NEGATIVE  NEGATIVE Final           __ Venous  Blood Gas result:  pH   7.37 Acid-base deficit 4.4 High  mmol/L   pCO2, Ven 35 Low  mmHg O2 Saturation 85.8 %  pO2, Ven 53 High  mmHg      __________________________________________________ Recent Labs  Lab 11/21/23 1530 11/21/23 1532  NA 144  --   K 3.6  --   CO2 20*  --   GLUCOSE 126*  --   BUN 12  --   CREATININE 0.81  --   CALCIUM 9.6  --   MG  --  2.0  PHOS  --  2.8    Cr  stable,   Lab Results  Component Value Date   CREATININE 0.81 11/21/2023   CREATININE 0.67 11/06/2023   CREATININE 0.78 07/19/2023     Recent Labs  Lab 11/21/23 1532  AST 23  ALT 24  ALKPHOS 80  BILITOT 0.5  PROT 7.0  ALBUMIN 3.9   Lab Results  Component Value Date   CALCIUM 9.6 11/21/2023   PHOS 3.3 07/04/2021    Plt: Lab Results  Component Value Date   PLT 282 11/21/2023       Recent Labs  Lab 11/21/23 1530  WBC 9.3  NEUTROABS 4.1  HGB 13.5  HCT 42.8  MCV 70.0*  PLT 282    HG/HCT  stable,     Component Value Date/Time   HGB 13.5 11/21/2023 1530   HGB 13.0 01/31/2020 0953   HCT 42.8 11/21/2023 1530   HCT 43.0 01/31/2020 0953   MCV 70.0 (L) 11/21/2023 1530   MCV 71 (L) 01/31/2020 0953    _______________________________________________ Hospitalist was called for admission for COPD exacerbation   The following Work up has been ordered so far:  Orders Placed This Encounter  Procedures   Resp panel by RT-PCR (RSV, Flu A&B, Covid) Anterior Nasal Swab   DG Chest 2 View   Basic metabolic panel   CBC with Differential   ED Cardiac monitoring   Utilize spacer/aerochamber with mdi inhaler for COVID-19 positive patients or PUI for COVID-19   If O2 Sat <94% administer O2 at 2 liters/minute via nasal cannula   Consult for [facility] Admission   ED EKG   EKG 12-Lead     OTHER  Significant initial  Findings:  labs showing:     DM  labs:  HbA1C: Recent Labs    05/26/23 1138  HGBA1C 5.7    CBG (last 3)  No results for input(s): "GLUCAP" in the last 72 hours.        Cultures:    Component Value Date/Time   SDES IN/OUT CATH URINE 01/09/2020 1655   SPECREQUEST  01/09/2020 1655    NONE Performed at [facility], [address], [place], [place] [address]    CULT MULTIPLE SPECIES PRESENT, SUGGEST RECOLLECTION (A) 01/09/2020 1655   REPTSTATUS 01/10/2020 FINAL 01/09/2020 1655     Radiological Exams on Admission: CT Angio Chest Pulmonary Embolism (PE) W or WO Contrast Result Date: 11/21/2023 CLINICAL DATA:  Shortness of breath and cough.  PE suspected EXAM: CT ANGIOGRAPHY CHEST WITH CONTRAST TECHNIQUE: Multidetector CT imaging of the chest was performed using the standard protocol during bolus administration of intravenous contrast. Multiplanar CT image reconstructions and MIPs were obtained to evaluate the vascular anatomy. RADIATION DOSE REDUCTION: This exam was performed according to the departmental dose-optimization program which includes automated exposure control, adjustment of the mA  and/or kV according to patient size and/or use of iterative reconstruction technique. CONTRAST:  75mL OMNIPAQUE IOHEXOL 350 MG/ML SOLN COMPARISON:  Same day chest radiograph FINDINGS: Cardiovascular: Negative for acute pulmonary embolism. Normal caliber thoracic aorta. No pericardial effusion. Mediastinum/Nodes: Trachea and esophagus are unremarkable. No thoracic adenopathy. 2.7 by 1.9 cm nodule inferior to the left thyroid in the superior mediastinum (series 7/image 70). Lungs/Pleura: No pleural effusion or pneumothorax. Bibasilar atelectasis/scarring. Mild diffuse bronchial wall thickening. Mosaic attenuation of the lungs compatible with air trapping. Upper Abdomen: No acute abnormality. Musculoskeletal: No acute fracture. Review of the MIP images confirms the above findings.  IMPRESSION: 1. Negative for acute pulmonary embolism. 2. Bronchial wall thickening and air trapping compatible with bronchitis/reactive airways. 3. 2.7 cm nodule inferior to the left thyroid in the superior mediastinum. This may represent a thyroid nodule, parathyroid adenoma, or enlarged lymph node. Recommend further evaluation with nonemergent thyroid ultrasound. Electronically Signed   By: Minerva Fester M.D.   On: 11/21/2023 23:31   DG Chest 2 View Result Date: 11/21/2023 CLINICAL DATA:  Shortness of breath and cough. EXAM: CHEST - 2 VIEW COMPARISON:  11/06/2023. FINDINGS: Trachea is midline. Heart size normal. Streaky atelectasis or scarring in lung bases. No airspace consolidation or pleural fluid. IMPRESSION: Bibasilar streaky atelectasis or scarring. Electronically Signed   By: Leanna Battles M.D.   On: 11/21/2023 16:36   _______________________________________________________________________________________________________ Latest  Blood pressure 121/72, pulse (!) 115, temperature 98.4 F (36.9 C), temperature source Oral, resp. rate (!) 21, height 5\' 6"  (1.676 m), weight 86 kg, last menstrual period 08/20/2011, SpO2 91%.   Vitals  labs and radiology finding personally reviewed  Review of Systems:    Pertinent positives include:  fatigue, dyspnea on exertion shortness of breath at rest.  Constitutional:  No weight loss, night sweats, Fevers, chills,  weight loss  HEENT:  No headaches, Difficulty swallowing,Tooth/dental problems,Sore throat,  No sneezing, itching, ear ache, nasal congestion, post nasal drip,  Cardio-vascular:  No chest pain, Orthopnea, PND, anasarca, dizziness, palpitations.no Bilateral lower extremity swelling  GI:  No heartburn, indigestion, abdominal pain, nausea, vomiting, diarrhea, change in bowel habits, loss of appetite, melena, blood in stool, hematemesis Resp:  no No , No excess mucus, no productive cough, No non-productive cough, No coughing up of blood.No  change in color of mucus.No wheezing. Skin:  no rash or lesions. No jaundice GU:  no dysuria, change in color of urine, no urgency or frequency. No straining to urinate.  No flank pain.  Musculoskeletal:  No joint pain or no joint swelling. No decreased range of motion. No back pain.  Psych:  No change in mood or affect. No depression or anxiety. No memory loss.  Neuro: no localizing neurological complaints, no tingling, no weakness, no double vision, no gait abnormality, no slurred speech, no confusion  All systems reviewed and apart from HOPI all are negative _______________________________________________________________________________________________ Past Medical History:   Past Medical History:  Diagnosis Date   Acid reflux    Blood transfusion    Diverticulitis    Hypertension    Sickle cell trait (HCC)     Past Surgical History:  Procedure Laterality Date   ABDOMINAL HYSTERECTOMY  2001   partial hysterectomy - emergency during last C-section   CESAREAN SECTION  1991; 1997; 2001   CESAREAN SECTION     FLEXIBLE SIGMOIDOSCOPY N/A 11/04/2021   Procedure: FLEXIBLE SIGMOIDOSCOPY;  Surgeon: Andria Meuse, MD;  Location: [facility];  Service: General;  Laterality: N/A;  XI ROBOTIC ASSISTED LOWER ANTERIOR RESECTION N/A 11/04/2021   Procedure: XI ROBOTIC ASSISTED LOWER ANTERIOR RESECTION, RESECTION OF COLOVAGINAL FISTULA, BILATERAL TAP BLOCK, INJECTION OF FIREFLY;  Surgeon: Andria Meuse, MD;  Location: [facility];  Service: General;  Laterality: N/A;    Social History:  Ambulatory   independently     reports that she has been smoking cigarettes. She has a 15.5 pack-year smoking history. She has never used smokeless tobacco. She reports current alcohol use of about 1.0 standard drink of alcohol per week. She reports current drug use. Frequency: 7.00 times per week. Drug: Marijuana.   Family History:   Family History  Problem Relation Age of Onset   Hypertension Mother     Diabetes Mother    Diabetes type II Mother    Diabetes type II Father    Stroke Father    Lung cancer Father 29   Esophageal cancer Maternal Grandfather    Colon cancer Neg Hx    Pancreatic cancer Neg Hx    Stomach cancer Neg Hx    ______________________________________________________________________________________________ Allergies: No Known Allergies   Prior to Admission medications   Medication Sig Start Date End Date Taking? Authorizing Provider  Accu-Chek FastClix Lancets MISC Use as directed to check blood glucose once a day before breakfast. 12/21/21   Newlin, Odette Horns, MD  albuterol (VENTOLIN HFA) 108 (90 Base) MCG/ACT inhaler INHALE 1-2 PUFFS BY MOUTH EVERY 6 HOURS AS NEEDED FOR WHEEZE OR SHORTNESS OF BREATH 11/21/23   Hoy Register, MD  amLODipine (NORVASC) 10 MG tablet Take 1 tablet (10 mg total) by mouth daily. 05/26/23 05/25/24  Hoy Register, MD  azithromycin (ZITHROMAX) 250 MG tablet Take 2 tablets (500 mg total) by mouth daily. Take first 2 tablets together, then 1 every day until finished. 11/06/23   Barrett, Horald Chestnut, PA-C  Blood Glucose Monitoring Suppl (BLOOD GLUCOSE MONITOR SYSTEM) w/Device KIT Use as directed to check blood glucose once a day before breakfast. 11/06/21   Narda Bonds, MD  fluticasone-salmeterol [facility] INHUB) 250-50 MCG/ACT AEPB Inhale 1 puff into the lungs in the morning and at bedtime. 10/04/23   Mecum, Erin E, PA-C  gabapentin (NEURONTIN) 300 MG capsule Take 1 capsule (300 mg total) by mouth at bedtime. 09/21/23   Magnant, Charles L, PA-C  glucose blood test strip Use to test blood glucose daily before breakfast. 11/06/21   Narda Bonds, MD  naproxen (NAPROSYN) 500 MG tablet TAKE 1 TABLET(500 MG) BY MOUTH TWICE DAILY WITH A MEAL 06/24/22   Hoy Register, MD  nicotine (NICODERM CQ) 7 mg/24hr patch Place 1 patch (7 mg total) onto the skin daily. 05/26/23   Hoy Register, MD  ondansetron (ZOFRAN-ODT) 4 MG disintegrating tablet Take 1 tablet (4 mg  total) by mouth every 8 (eight) hours as needed for nausea or vomiting. 10/04/23   Mecum, Erin E, PA-C  predniSONE (DELTASONE) 20 MG tablet Take 2 tablets (40 mg total) by mouth daily. 11/06/23   Barrett, Horald Chestnut, PA-C  tirzepatide [facility]) 15 MG/0.5ML Pen Inject 15 mg into the skin once a week. 05/26/23   Hoy Register, MD  traMADol (ULTRAM) 50 MG tablet Take 1 tablet (50 mg total) by mouth at bedtime as needed. 08/25/23 08/24/24  Julieanne Cotton, PA-C    ___________________________________________________________________________________________________ Physical Exam:    11/21/2023    9:00 PM 11/21/2023    7:00 PM 11/21/2023    2:58 PM  Vitals with BMI  Height   5\' 6"   Weight  189 lbs 10 oz  BMI   30.62  Systolic 121 129   Diastolic 72 87   Pulse 115 102     1. General:  in No  Acute distress   Chronically ill  -appearing 2. Psychological: Alert and   Oriented 3. Head/ENT: Dry Mucous Membranes                          Head Non traumatic, neck supple                           Poor Dentition 4. SKIN:  decreased Skin turgor,  Skin clean Dry and intact no rash    5. Heart: Regular rate and rhythm no  Murmur, no Rub or gallop 6. Lungs: no wheezes or crackles   7. Abdomen: Soft, , Non distended   obese  bowel sounds present 8. Lower extremities: no clubbing, cyanosis, no  edema 9. Neurologically Grossly intact, moving all 4 extremities equally   10. MSK: Normal range of motion    Chart has been reviewed  ______________________________________________________________________________________________  Assessment/Plan 53 y.o. female with medical history significant of  hypertension, GERD, Crohn's disease, diverticulitis tobacco abuse, DM2  Admitted for COPD exacerbation   Present on Admission:  COPD exacerbation (HCC)  Essential hypertension  GERD (gastroesophageal reflux disease)  Tobacco dependence  Prolonged QT interval  Lactic acidosis     Type 2 diabetes mellitus  (HCC)  - Order  moderate SSI    -  check TSH and HgA1C  - Hold by mouth medications    Essential hypertension Resume Norvasc 10 mg a day if able to tolerate  GERD (gastroesophageal reflux disease) Make sure on Protonix 40 mg a day  Tobacco dependence  - Spoke about importance of quitting spent 5 minutes discussing options for treatment, prior attempts at quitting, and dangers of smoking  -At this point patient is    interested in quitting  - order nicotine patch   - nursing tobacco cessation protocol   COPD exacerbation (HCC)  -  - Will initiate: Steroid taper  -  Antibiotics Rocephin - Albuterol   PRN, - scheduled duoneb,  -  Breo or Dulera at discharge   -  Mucinex.  Titrate O2 to saturation >90%. Follow patients respiratory status.  influenza PCR negative  VBG pending    Currently mentating well no evidence of symptomatic hypercarbia   Prolonged QT interval - will monitor on tele avoid QT prolonging medications, rehydrate correct electrolytes   Lactic acidosis In the setting of significant work of breathing. Continue to follow serial lactic acid Rehydrate check liver function    Other plan as per orders.  DVT prophylaxis:  SCD    Code Status:    Code Status: Prior FULL CODE   as per patient   I had personally discussed CODE STATUS with patient   ACP   none    Family Communication:   Family not at  Bedside    Diet diabetic diet   Disposition Plan:   To home once workup is complete and patient is stable   Following barriers for discharge:  Electrolytes corrected                           Consults called:  none Would benefit from pulmonology outpatient follow-up  Admission status:  ED Disposition     ED Disposition  Admit   Condition  --   Comment  Hospital [facility]: [facility] [[id number]]  Level of Care: Progressive [102]  Admit to Progressive based on following criteria:  RESPIRATORY PROBLEMS hypoxemic/hypercapnic respiratory failure that is responsive to NIPPV (BiPAP) or High Flow Nasal Cannula (6-80 lpm). Frequent assessment/intervention, no > Q2 hrs < Q4 hrs, to maintain oxygenation and pulmonary hygiene.  May admit patient to [facility] or [facility] if equivalent level of care is available:: No  Covid Evaluation: Asymptomatic - no recent exposure (last 10 days) testing not required  Diagnosis: COPD exacerbation [facility]) [[id number]]  Admitting Physician: Therisa Doyne [[id number]]  Attending Physician: Therisa Doyne [[id number]]  Certification:: I certify this patient will need inpatient services for at least 2 midnights  Expected Medical Readiness: 11/23/2023            inpatient     I Expect 2 midnight stay secondary to severity of patient's current illness need for inpatient interventions justified by the following:  hemodynamic instability despite optimal treatment (tachycardia ) * Severe lab/radiological/exam abnormalities including:     and extensive comorbidities including:  DM2    COPD/asthma  Obesity   That are currently affecting medical management.   I expect  patient to be hospitalized for 2 midnights requiring inpatient medical care.  Patient is at high risk for adverse outcome (such as loss of life or disability) if not treated.    Need for  IV fluids frequent respiratory assessments and frequent albuterol use    Level of care          progressive        Lab Results  Component Value Date   SARSCOV2NAA NEGATIVE 11/21/2023     Precautions: admitted as   Covid Negative     Amahia Madonia 11/21/2023, 11:44 PM    [facility]     after 2 AM please page floor coverage PA If 7AM-7PM, please contact the day team taking care of the patient using [email or web address]

## 2023-11-21 NOTE — Assessment & Plan Note (Signed)
-  Spoke about importance of quitting spent 5 minutes discussing options for treatment, prior attempts at quitting, and dangers of smoking ? -At this point patient is    interested in quitting ? - order nicotine patch  ? - nursing tobacco cessation protocol ? ?

## 2023-11-21 NOTE — Assessment & Plan Note (Signed)
 Resume Norvasc 10 mg a day if able to tolerate

## 2023-11-21 NOTE — Telephone Encounter (Signed)
 Noted.

## 2023-11-21 NOTE — ED Provider Triage Note (Signed)
 Emergency Medicine Provider Triage Evaluation Note  Megumi Treaster , a 53 y.o. female  was evaluated in triage.  Pt complains of SHOB x 1 mo, non-productive cough. Hx: bronchitis. Has been using albuterol "every time I [she] coughs".  Review of Systems  Positive: Cough, SHOB Negative: Fever, chills, N/V/D, abd pain, CP  Physical Exam  BP 130/84 (BP Location: Left Arm)   Pulse (!) 115   Temp 97.8 F (36.6 C)   Resp (!) 24   Ht 5\' 6"  (1.676 m)   Wt 86 kg   LMP 08/20/2011   SpO2 95%   BMI 30.60 kg/m  Gen:   Awake, no distress, tachycardic on exam  Resp:  Increased effort, wheezing in right lobes and LLL MSK:   Moves extremities without difficulty  Other:  Pt smells of THC  Medical Decision Making  Medically screening exam initiated at 3:26 PM.  Appropriate orders placed.  Tori Dattilio was informed that the remainder of the evaluation will be completed by another provider, this initial triage assessment does not replace that evaluation, and the importance of remaining in the ED until their evaluation is complete.  Labs and imaging ordered   Gretta Began 11/21/23 1531

## 2023-11-21 NOTE — Assessment & Plan Note (Signed)
-  -   Will initiate: Steroid taper  -  Antibiotics Rocephin - Albuterol   PRN, - scheduled duoneb,  -  Breo or Dulera at discharge   -  Mucinex.  Titrate O2 to saturation >90%. Follow patients respiratory status.  influenza PCR negative  VBG pending    Currently mentating well no evidence of symptomatic hypercarbia

## 2023-11-21 NOTE — Assessment & Plan Note (Signed)
-  will monitor on tele avoid QT prolonging medications, rehydrate correct electrolytes ? ?

## 2023-11-21 NOTE — Assessment & Plan Note (Signed)
-   Order   moderate SSI     -  check TSH and HgA1C  - Hold by mouth medications

## 2023-11-21 NOTE — Assessment & Plan Note (Signed)
 Make sure on Protonix 40 mg a day

## 2023-11-21 NOTE — Telephone Encounter (Signed)
  Chief Complaint: chest tightness, SOB at rest cough nonproductive Symptoms: see above. S/p bronchitis 11/06/23. Continued sx now wheezing SOB at rest. Cough dry tastes like blood after coughing but denies blood in sputum. Husband reports pt stops breathing at night. Frequency: 2 weeks and today worsening  Pertinent Negatives: Patient denies fever Disposition: [x] ED /[] Urgent Care (no appt availability in office) / [] Appointment(In office/virtual)/ []  [place] [facility]/ [] Home Care/ [] Refused Recommended Disposition /[] [place] [facility]/ []  Follow-up with PCP Additional Notes:   Recommended ED now and call 911 for worsening sx .     Copied from CRM [id number]. Topic: Clinical - Red Word Triage >> Nov 21, 2023 11:59 AM Shelah Lewandowsky wrote: Red Word that prompted transfer to Nurse Triage: wheezing, coughing, stops breathing in sleep Reason for Disposition  [1] MODERATE difficulty breathing (e.g., speaks in phrases, SOB even at rest, pulse 100-120) AND [2] still present when not coughing  Answer Assessment - Initial Assessment Questions 1. ONSET: "When did the cough begin?"      Prior to 11/06/23 2. SEVERITY: "How bad is the cough today?"      Continued cough unable to cough up mucus but does taste like blood 3. SPUTUM: "Describe the color of your sputum" (none, dry cough; clear, white, yellow, green)     none 4. HEMOPTYSIS: "Are you coughing up any blood?" If so ask: "How much?" (flecks, streaks, tablespoons, etc.)     No but does taste like blood after coughing  5. DIFFICULTY BREATHING: "Are you having difficulty breathing?" If Yes, ask: "How bad is it?" (e.g., mild, moderate, severe)    - MILD: No SOB at rest, mild SOB with walking, speaks normally in sentences, can lie down, no retractions, pulse < 100.    - MODERATE: SOB at rest, SOB with minimal exertion and prefers to sit, cannot lie down flat, speaks in phrases, mild retractions, audible wheezing, pulse 100-120.    - SEVERE:  Very SOB at rest, speaks in single words, struggling to breathe, sitting hunched forward, retractions, pulse > 120      SOB at rest 6. FEVER: "Do you have a fever?" If Yes, ask: "What is your temperature, how was it measured, and when did it start?"     na 7. CARDIAC HISTORY: "Do you have any history of heart disease?" (e.g., heart attack, congestive heart failure)      na 8. LUNG HISTORY: "Do you have any history of lung disease?"  (e.g., pulmonary embolus, asthma, emphysema)     Hx COPD 9. PE RISK FACTORS: "Do you have a history of blood clots?" (or: recent major surgery, recent prolonged travel, bedridden)     na 10. OTHER SYMPTOMS: "Do you have any other symptoms?" (e.g., runny nose, wheezing, chest pain)       Chest tightness, SOB at rest wheezing  11. PREGNANCY: "Is there any chance you are pregnant?" "When was your last menstrual period?"       na 12. TRAVEL: "Have you traveled out of the country in the last month?" (e.g., travel history, exposures)       na  Protocols used: Cough - Acute Productive-A-AH

## 2023-11-21 NOTE — Assessment & Plan Note (Signed)
Follow up as an outpt ?

## 2023-11-21 NOTE — ED Provider Notes (Signed)
 [place] EMERGENCY DEPARTMENT AT [facility] Provider Note   CSN: [id number] Arrival date & time: 11/21/23  1423     History Chief Complaint  Patient presents with   Shortness of Breath    HPI Amanda Garner is a 53 y.o. female presenting for worsening shortness of breath.  She is a 53 year old female with approximate 30-pack-year smoking history.  Was diagnosed with bronchitis possible COPD exacerbation 2 weeks ago.  Improved with therapy however has already finished her albuterol inhaler since that visit. States she is using it 20-40 times a day.  Very short of breath on arrival.  Denies fevers chills nausea vomiting syncope or chest pain.  States she is relatively profoundly coughing.  Finished her steroid taper.   Patient's recorded medical, surgical, social, medication list and allergies were reviewed in the Snapshot window as part of the initial history.   Review of Systems   Review of Systems  Constitutional:  Negative for chills and fever.  HENT:  Positive for congestion. Negative for ear pain and sore throat.   Eyes:  Negative for pain and visual disturbance.  Respiratory:  Positive for cough and shortness of breath. Negative for choking.   Cardiovascular:  Negative for chest pain and palpitations.  Gastrointestinal:  Negative for abdominal pain and vomiting.  Genitourinary:  Negative for dysuria and hematuria.  Musculoskeletal:  Negative for arthralgias and back pain.  Skin:  Negative for color change and rash.  Neurological:  Negative for seizures and syncope.  All other systems reviewed and are negative.   Physical Exam Updated Vital Signs BP 118/82   Pulse (!) 117   Temp 98.4 F (36.9 C) (Oral)   Resp (!) 25   Ht 5\' 6"  (1.676 m)   Wt 86 kg   LMP 08/20/2011   SpO2 (!) 89%   BMI 30.60 kg/m  Physical Exam Vitals and nursing note reviewed.  Constitutional:      General: She is not in acute distress.    Appearance: She is well-developed.   HENT:     Head: Normocephalic and atraumatic.  Eyes:     Conjunctiva/sclera: Conjunctivae normal.  Cardiovascular:     Rate and Rhythm: Normal rate and regular rhythm.     Heart sounds: No murmur heard. Pulmonary:     Effort: Pulmonary effort is normal. Tachypnea present. No respiratory distress.     Breath sounds: Decreased breath sounds and wheezing present.  Abdominal:     General: There is no distension.     Palpations: Abdomen is soft.     Tenderness: There is no abdominal tenderness. There is no right CVA tenderness or left CVA tenderness.  Musculoskeletal:        General: No swelling or tenderness. Normal range of motion.     Cervical back: Neck supple.  Skin:    General: Skin is warm and dry.  Neurological:     General: No focal deficit present.     Mental Status: She is alert and oriented to person, place, and time. Mental status is at baseline.     Cranial Nerves: No cranial nerve deficit.      ED Course/ Medical Decision Making/ A&P    Procedures Procedures   Medications Ordered in ED Medications  insulin aspart (novoLOG) injection 0-5 Units (2 Units Subcutaneous Given 11/21/23 2301)  insulin aspart (novoLOG) injection 0-15 Units (has no administration in time range)  pantoprazole (PROTONIX) EC tablet 40 mg (has no administration in time range)  ipratropium-albuterol (  DUONEB) 0.5-2.5 (3) MG/3ML nebulizer solution 3 mL (3 mLs Nebulization Given 11/21/23 1503)  magnesium sulfate IVPB 2 g 50 mL (0 g Intravenous Stopped 11/21/23 1915)  albuterol (PROVENTIL) (2.5 MG/3ML) 0.083% nebulizer solution 20 mg (20 mg Nebulization Given 11/21/23 1800)  methylPREDNISolone sodium succinate (SOLU-MEDROL) 125 mg/2 mL injection 125 mg (125 mg Intravenous Given 11/21/23 1821)  acetaminophen (TYLENOL) tablet 1,000 mg (1,000 mg Oral Given 11/21/23 2005)  ketorolac (TORADOL) 15 MG/ML injection 15 mg (15 mg Intravenous Given 11/21/23 2006)  sodium chloride 0.9 % bolus 1,000 mL (1,000 mLs  Intravenous New Bag/Given 11/21/23 2302)  iohexol (OMNIPAQUE) 350 MG/ML injection 75 mL (75 mLs Intravenous Contrast Given 11/21/23 2323)   Medical Decision Making:   Amanda Garner is a 53 y.o. female with a history of bronchitis, who presented to the ED today with acute on chronic SOB. They are endorsing worsening of their baseline dyspnea over the past 72 hours. Their baseline is a 0L O2 requirement. At their baseline they are able to get around the neighborhood and they are not able to at this time.   On my initial exam, the pt was SOB and tachypneic. Audible wheezing and grossly decreased breath sounds appreciated.  They are endorsing increased sputum production.    Reviewed and confirmed nursing documentation for past medical history, family history, social history.    Initial Assessment:   With the patient's presentation of SOB in the above setting, most likely diagnosis is COPD Exacerbation. Other diagnoses were considered including (but not limited to) CAP, PE, ACS, viral infection, PTX. These are considered less likely due to history of present illness and physical exam findings.   This is most consistent with an acute life/limb threatening illness complicated by underlying chronic conditions.  Initial Plan:  Empiric treatment of patient's symptoms with immediate initiation of inhaled bronchodilators and IV steroids. Given advanced nature of patient's presentation, will proceed with IV magnesium as a rescue therapy.   Evaluation for ACS with EKG and delta troponin  Evaluation for infectious versus intrathoracic abnormality with chest x-ray  Evaluation for volume overload with BNP  Screening labs including CBC and Metabolic panel to evaluate for infectious or metabolic etiology of disease.  CTAPE study  Objective evaluation as below reviewed   Initial Study Results:   Laboratory  All laboratory results reviewed without evidence of clinically relevant pathology.    EKG EKG was  reviewed independently. Rate, rhythm, axis, intervals all examined and without medically relevant abnormality. ST segments without concerns for elevations.    Radiology:  All images reviewed independently. Agree with radiology report at this time.   DG Chest 2 View Result Date: 11/21/2023 CLINICAL DATA:  Shortness of breath and cough. EXAM: CHEST - 2 VIEW COMPARISON:  11/06/2023. FINDINGS: Trachea is midline. Heart size normal. Streaky atelectasis or scarring in lung bases. No airspace consolidation or pleural fluid. IMPRESSION: Bibasilar streaky atelectasis or scarring. Electronically Signed   By: Leanna Battles M.D.   On: 11/21/2023 16:36   DG Chest 2 View Result Date: 11/06/2023 CLINICAL DATA:  Short of breath over the last 2 days. EXAM: CHEST - 2 VIEW COMPARISON:  08/30/2022 and older studies. FINDINGS: Normal heart, mediastinum and hila. Lungs are clear.  No pleural effusion or pneumothorax. Skeletal structures are intact. IMPRESSION: No active cardiopulmonary disease. Electronically Signed   By: Amie Portland M.D.   On: 11/06/2023 10:11     Final Assessment and Plan:   After initiation of medical therapies, patient is grossly  improved and no longer in acute distress.    Given the advanced nature of the patient's prenetation, patient will require advanced care and admission was arranged.      Clinical Impression:  1. SOB (shortness of breath)      Admit \  Clinical Impression:  1. SOB (shortness of breath)      Admit   Final Clinical Impression(s) / ED Diagnoses Final diagnoses:  SOB (shortness of breath)    Rx / DC Orders ED Discharge Orders     None         Glyn Ade, MD 11/21/23 2326

## 2023-11-21 NOTE — Subjective & Objective (Signed)
 Patient has been having bronchitis for the past week States that now her cough diastolic blood and she has been more short of breath no blood in sputum no fever States cough is nonproductive Patient was seen on 16th in ER was given steroids and albuterol inhaler but her cough persisted she has been using albuterol every time she coughs states she used it about 50 times in the past 24 hours Very short of breath on arrival no nausea no vomiting She finished her steroids COVID-negative influenza negative

## 2023-11-21 NOTE — ED Triage Notes (Signed)
 Pt presents with 1 month hx of ShOB and non-productive cough.  She was seen in the ED on 3/16 and was Rx of steroids and an albuterol inhaler. Pt has continued to have worsening in her cough and ShOB. She has used her inhaler "everytime I cough" about 50 times in last 24 hours. Denies fevers.

## 2023-11-21 NOTE — ED Notes (Signed)
 Patient transported to CT

## 2023-11-21 NOTE — Assessment & Plan Note (Signed)
 In the setting of significant work of breathing. Continue to follow serial lactic acid Rehydrate check liver function

## 2023-11-22 ENCOUNTER — Encounter (HOSPITAL_COMMUNITY): Payer: Self-pay | Admitting: Internal Medicine

## 2023-11-22 ENCOUNTER — Other Ambulatory Visit (HOSPITAL_COMMUNITY)

## 2023-11-22 ENCOUNTER — Inpatient Hospital Stay (HOSPITAL_COMMUNITY)

## 2023-11-22 DIAGNOSIS — J441 Chronic obstructive pulmonary disease with (acute) exacerbation: Secondary | ICD-10-CM | POA: Diagnosis not present

## 2023-11-22 DIAGNOSIS — R9431 Abnormal electrocardiogram [ECG] [EKG]: Secondary | ICD-10-CM | POA: Diagnosis not present

## 2023-11-22 LAB — COMPREHENSIVE METABOLIC PANEL WITH GFR
ALT: 18 U/L (ref 0–44)
AST: 18 U/L (ref 15–41)
Albumin: 3.4 g/dL — ABNORMAL LOW (ref 3.5–5.0)
Alkaline Phosphatase: 67 U/L (ref 38–126)
Anion gap: 8 (ref 5–15)
BUN: 11 mg/dL (ref 6–20)
CO2: 19 mmol/L — ABNORMAL LOW (ref 22–32)
Calcium: 9.1 mg/dL (ref 8.9–10.3)
Chloride: 114 mmol/L — ABNORMAL HIGH (ref 98–111)
Creatinine, Ser: 0.74 mg/dL (ref 0.44–1.00)
GFR, Estimated: >60 mL/min (ref >60)
Glucose, Bld: 177 mg/dL — ABNORMAL HIGH (ref 70–99)
Potassium: 3.7 mmol/L (ref 3.5–5.1)
Sodium: 141 mmol/L (ref 135–145)
Total Bilirubin: 0.3 mg/dL (ref 0.0–1.2)
Total Protein: 6.4 g/dL — ABNORMAL LOW (ref 6.5–8.1)

## 2023-11-22 LAB — RESPIRATORY PANEL BY PCR

## 2023-11-22 LAB — CBC
HCT: 37 % (ref 36.0–46.0)
Hemoglobin: 11.8 g/dL — ABNORMAL LOW (ref 12.0–15.0)
MCH: 22.1 pg — ABNORMAL LOW (ref 26.0–34.0)
MCHC: 31.9 g/dL (ref 30.0–36.0)
MCV: 69.3 fL — ABNORMAL LOW (ref 80.0–100.0)
Platelets: 228 10*3/uL (ref 150–400)
RBC: 5.34 MIL/uL — ABNORMAL HIGH (ref 3.87–5.11)
RDW: 15.4 % (ref 11.5–15.5)
WBC: 9.9 10*3/uL (ref 4.0–10.5)
nRBC: 0 % (ref 0.0–0.2)

## 2023-11-22 LAB — MAGNESIUM: Magnesium: 2.3 mg/dL (ref 1.7–2.4)

## 2023-11-22 LAB — ECHOCARDIOGRAM COMPLETE
AV Peak grad: 16 mmHg
Ao pk vel: 2 m/s
Area-P 1/2: 5.31 cm2
Height: 66 in
S' Lateral: 2.6 cm
Weight: 3033.53 [oz_av]

## 2023-11-22 LAB — CBG MONITORING, ED
Glucose-Capillary: 105 mg/dL — ABNORMAL HIGH (ref 70–99)
Glucose-Capillary: 101 mg/dL — ABNORMAL HIGH (ref 70–99)
Glucose-Capillary: 164 mg/dL — ABNORMAL HIGH (ref 70–99)

## 2023-11-22 LAB — LACTIC ACID, PLASMA
Lactic Acid, Venous: 4 mmol/L (ref 0.5–1.9)
Lactic Acid, Venous: 2.4 mmol/L (ref 0.5–1.9)

## 2023-11-22 LAB — GLUCOSE, CAPILLARY
Glucose-Capillary: 219 mg/dL — ABNORMAL HIGH (ref 70–99)
Glucose-Capillary: 169 mg/dL — ABNORMAL HIGH (ref 70–99)

## 2023-11-22 LAB — HIV ANTIBODY (ROUTINE TESTING W REFLEX): HIV Screen 4th Generation wRfx: NONREACTIVE

## 2023-11-22 LAB — PHOSPHORUS: Phosphorus: 2.4 mg/dL — ABNORMAL LOW (ref 2.5–4.6)

## 2023-11-22 MED ORDER — NICOTINE 14 MG/24HR TD PT24
14.0000 mg | MEDICATED_PATCH | Freq: Every day | TRANSDERMAL | Status: DC
  Administered 2023-11-22 – 2023-11-23 (×2): 14 mg via TRANSDERMAL
  Filled 2023-11-22 (×2): qty 1

## 2023-11-22 MED ORDER — SODIUM CHLORIDE 0.9 % IV SOLN
1.0000 g | INTRAVENOUS | Status: DC
  Administered 2023-11-22 (×2): 1 g via INTRAVENOUS
  Filled 2023-11-22 (×2): qty 10

## 2023-11-22 MED ORDER — BENZONATATE 100 MG PO CAPS
100.0000 mg | ORAL_CAPSULE | Freq: Three times a day (TID) | ORAL | Status: DC | PRN
  Administered 2023-11-22: 100 mg via ORAL
  Filled 2023-11-22: qty 1

## 2023-11-22 MED ORDER — HYDROCODONE-ACETAMINOPHEN 5-325 MG PO TABS
1.0000 | ORAL_TABLET | ORAL | Status: DC | PRN
  Administered 2023-11-22 (×2): 1 via ORAL
  Filled 2023-11-22 (×2): qty 1

## 2023-11-22 MED ORDER — SODIUM CHLORIDE 0.9 % IV SOLN: INTRAVENOUS | Status: DC

## 2023-11-22 MED ORDER — ACETAMINOPHEN 650 MG RE SUPP: 650.0000 mg | Freq: Four times a day (QID) | RECTAL | Status: DC | PRN

## 2023-11-22 MED ORDER — NICOTINE 7 MG/24HR TD PT24: 7.0000 mg | MEDICATED_PATCH | Freq: Every day | TRANSDERMAL | Status: DC

## 2023-11-22 MED ORDER — ACETAMINOPHEN 325 MG PO TABS
650.0000 mg | ORAL_TABLET | Freq: Four times a day (QID) | ORAL | Status: DC | PRN
  Administered 2023-11-22: 650 mg via ORAL
  Filled 2023-11-22: qty 2

## 2023-11-22 MED ORDER — PREDNISONE 20 MG PO TABS
40.0000 mg | ORAL_TABLET | Freq: Every day | ORAL | Status: DC
  Administered 2023-11-23: 40 mg via ORAL
  Filled 2023-11-22: qty 2

## 2023-11-22 MED ORDER — ALBUTEROL SULFATE (2.5 MG/3ML) 0.083% IN NEBU: 2.5000 mg | INHALATION_SOLUTION | RESPIRATORY_TRACT | Status: DC | PRN

## 2023-11-22 MED ORDER — AMLODIPINE BESYLATE 10 MG PO TABS
10.0000 mg | ORAL_TABLET | Freq: Every day | ORAL | Status: DC
  Administered 2023-11-22 – 2023-11-23 (×2): 10 mg via ORAL
  Filled 2023-11-22: qty 2
  Filled 2023-11-22: qty 1

## 2023-11-22 MED ORDER — BENZONATATE 100 MG PO CAPS: 100.0000 mg | ORAL_CAPSULE | Freq: Three times a day (TID) | ORAL | Status: DC

## 2023-11-22 MED ORDER — METHYLPREDNISOLONE SODIUM SUCC 40 MG IJ SOLR
40.0000 mg | Freq: Two times a day (BID) | INTRAMUSCULAR | Status: AC
  Administered 2023-11-22 (×2): 40 mg via INTRAVENOUS
  Filled 2023-11-22 (×3): qty 1

## 2023-11-22 MED ORDER — GABAPENTIN 300 MG PO CAPS
300.0000 mg | ORAL_CAPSULE | Freq: Every day | ORAL | Status: DC
  Administered 2023-11-22 (×2): 300 mg via ORAL
  Filled 2023-11-22 (×2): qty 1

## 2023-11-22 MED ORDER — GUAIFENESIN ER 600 MG PO TB12
600.0000 mg | ORAL_TABLET | Freq: Two times a day (BID) | ORAL | Status: DC
  Administered 2023-11-22 – 2023-11-23 (×4): 600 mg via ORAL
  Filled 2023-11-22 (×4): qty 1

## 2023-11-22 MED ORDER — SODIUM CHLORIDE 0.9 % IV SOLN: INTRAVENOUS | Status: AC

## 2023-11-22 MED ORDER — IPRATROPIUM-ALBUTEROL 0.5-2.5 (3) MG/3ML IN SOLN
3.0000 mL | Freq: Four times a day (QID) | RESPIRATORY_TRACT | Status: DC
  Administered 2023-11-22 – 2023-11-23 (×5): 3 mL via RESPIRATORY_TRACT
  Filled 2023-11-22 (×5): qty 3

## 2023-11-22 NOTE — Progress Notes (Signed)
 Patient transferred from the ED at 1450 pm  via bed. Alert and oriented. On RA. Connected to the cardiac monitor box 16. Connected to the pulseoxymeter. Will continue to monitor

## 2023-11-22 NOTE — ED Notes (Signed)
 RN called floor coverage, she is aware of second lactic.  She just wants the fluid to be started.

## 2023-11-22 NOTE — Plan of Care (Signed)
  Problem: Fluid Volume: Goal: Ability to maintain a balanced intake and output will improve Outcome: Progressing   Problem: Health Behavior/Discharge Planning: Goal: Ability to identify and utilize available resources and services will improve Outcome: Progressing   Problem: Nutritional: Goal: Maintenance of adequate nutrition will improve Outcome: Progressing   Problem: Skin Integrity: Goal: Risk for impaired skin integrity will decrease Outcome: Progressing   Problem: Tissue Perfusion: Goal: Adequacy of tissue perfusion will improve Outcome: Progressing   Problem: Education: Goal: Knowledge of General Education information will improve Description: Including pain rating scale, medication(s)/side effects and non-pharmacologic comfort measures Outcome: Progressing   Problem: Health Behavior/Discharge Planning: Goal: Ability to manage health-related needs will improve Outcome: Progressing   Problem: Clinical Measurements: Goal: Will remain free from infection Outcome: Progressing Goal: Respiratory complications will improve Outcome: Progressing   Problem: Activity: Goal: Risk for activity intolerance will decrease Outcome: Progressing   Problem: Nutrition: Goal: Adequate nutrition will be maintained Outcome: Progressing   Problem: Coping: Goal: Level of anxiety will decrease Outcome: Progressing   Problem: Elimination: Goal: Will not experience complications related to urinary retention Outcome: Progressing   Problem: Safety: Goal: Ability to remain free from injury will improve Outcome: Progressing   Problem: Skin Integrity: Goal: Risk for impaired skin integrity will decrease Outcome: Progressing   Problem: Education: Goal: Knowledge of disease or condition will improve Outcome: Progressing

## 2023-11-22 NOTE — Progress Notes (Signed)
 PROGRESS NOTE  Amanda Garner  DOB: February 05, 1971  PCP: Hoy Register, MD ZOX:[id number]  DOA: 11/21/2023  LOS: 1 day  Hospital Day: 2  Brief narrative: Amanda Garner is a 53 y.o. female with PMH significant for HTN, GERD, Crohn's disease, chronic smoking. 3/31, patient presented to the ED with complaint of progressively worsening shortness of breath for 3 weeks associated with nonproductive cough. She was first seen in the ED on 3/16 for same symptoms and was discharged on steroids and albuterol inhaler.  She completed a course of prednisone disparate continue to have symptoms.  Her symptoms got worse to a point where she had to use the inhaler for more than 50 times in 24 hours and hence decided to come to the ED. In the ED, patient was afebrile, tachycardic to 110s, tachypneic to 20s, breathing on room air at rest, blood pressure 130s. Labs showed WC count 9.3, hemoglobin 13.5, BUN/creatinine normal, procalcitonin not elevated,  lactic acid elevated to 4.1>4>2.4 Respiratory virus panel unremarkable. CT angio chest was negative for acute pulm embolism.  It showed bronchial wall thickening and air trapping compatible with bronchitis/reactive airways.  Admitted to [facility]   Subjective: Patient was seen and examined this morning.  Pleasant middle-aged African-American female.  Propped up in bed.  Not in distress.  Not on supplemental oxygen at rest.  No family at bedside. Complains of dry cough.  Assessment and plan: Acute bronchitis Presented with bronchitis symptoms for 3 weeks despite completion of course of prednisone and use of inhalers. Smokes 1 to 2 cigarettes a day but denies any prior diagnosis of COPD or emphysema. CT angio chest on which did not show any pulm embolism, showed bronchial wall thickening and air trapping compatible with bronchitis. On admission, patient was started on IV antibiotics, IV steroids, bronchodilators, Mucinex. Continue same for next 24  hours. Encourage ambulation Check ambulatory oxygen requirement  Lactic acidosis WBC count normal, procalcitonin level not elevated but lactic acid level is elevated. No evidence of sepsis.  Need to rule out low output heart failure especially given tachycardia and disproportionate degree of dyspnea on exertion.  Obtain echocardiogram. Recent Labs  Lab 11/21/23 1530 11/21/23 1532 11/21/23 2212 11/22/23 0050 11/22/23 0653  WBC 9.3  --   --   --  9.9  LATICACIDVEN  --   --  4.1* 4.0* 2.4*  PROCALCITON  --  <0.10  --   --   --    Hypertension Continue amlodipine 10 mg daily as before  Type 2 diabetes mellitus controlled Hyperglycemia due to steroids Peripheral neuropathy A1c 5.5 on 11/21/2023 PTA meds-Mounjaro Hyperglycemia likely due to steroids.  Was recently on a course of prednisone.  Currently on IV Solu-Medrol Continue SSI/Accu-Cheks Recent Labs  Lab 11/21/23 2218 11/22/23 0728 11/22/23 1137 11/22/23 1243  GLUCAP 210* 105* 101* 164*   GERD Protonix  Chronic daily smoker Says she smokes 1 to 2 cigarettes a day. Counseled to quit.  Nicotine patch offered  patient states she was trying bupropion at home but does not think it works for her.  Prolonged QT interval EKG with QTc 506 ms  Monitor electrolytes.   Repeat EKG    Thyroid nodule CT angio chest also showed 2.7 cm nodule inferior to the left thyroid in the superior mediastinum. This may represent a thyroid nodule, parathyroid adenoma, or enlarged lymph node. Recommend further evaluation with nonemergent thyroid ultrasound. Follow-up with PCP as an outpatient for further workup.     Mobility: Encourage ambulation  Goals  of care   Code Status: Full Code     DVT prophylaxis:  SCDs Start: 11/22/23 0007   Antimicrobials: IV Rocephin Fluid: None Consultants: None Family Communication: None at bedside  Status: Inpatient Level of care:  Telemetry Medical   Patient is from: Home Needs to continue  in-hospital care: Continues to remain short of breath.  Needs echo and 24 more hours of monitoring Anticipated d/c to: Hopefully home in 1 to 2 days      Diet:  Diet Order             Diet Heart Room service appropriate? Yes; Fluid consistency: Thin  Diet effective now                   Scheduled Meds:  amLODipine  10 mg Oral Daily   gabapentin  300 mg Oral QHS   guaiFENesin  600 mg Oral BID   insulin aspart  0-15 Units Subcutaneous TID WC   insulin aspart  0-5 Units Subcutaneous QHS   ipratropium-albuterol  3 mL Nebulization Q6H   methylPREDNISolone (SOLU-MEDROL) injection  40 mg Intravenous Q12H   Followed by   Melene Muller ON 11/23/2023] predniSONE  40 mg Oral Q breakfast   nicotine  14 mg Transdermal Daily   pantoprazole  40 mg Oral Q1200    PRN meds: acetaminophen **OR** acetaminophen, albuterol, HYDROcodone-acetaminophen   Infusions:   sodium chloride 125 mL/hr at 11/22/23 0442   cefTRIAXone (ROCEPHIN)  IV Stopped (11/22/23 0122)    Antimicrobials: Anti-infectives (From admission, onward)    Start     Dose/Rate Route Frequency Ordered Stop   11/22/23 0006  cefTRIAXone (ROCEPHIN) 1 g in sodium chloride 0.9 % 100 mL IVPB        1 g 200 mL/hr over 30 Minutes Intravenous Every 24 hours 11/22/23 0006 11/27/23 0014       Objective: Vitals:   11/22/23 0823 11/22/23 1215  BP: (!) 143/89 (!) 158/95  Pulse: (!) 107 (!) 107  Resp: (!) 21 17  Temp: 97.8 F (36.6 C) 98 F (36.7 C)  SpO2: 93% 95%   No intake or output data in the 24 hours ending 11/22/23 1301 Filed Weights   11/21/23 1458  Weight: 86 kg   Weight change:  Body mass index is 30.6 kg/m.   Physical Exam: General exam: Pleasant, middle-aged African-American female Skin: No rashes, lesions or ulcers. HEENT: Atraumatic, normocephalic, no obvious bleeding Lungs: No crackles or wheezing.  Coughs on deep breathing CVS: S1, S2, no murmur,   GI/Abd: Soft, nontender, nondistended, bowel sound  present,   CNS: Alert, awake, oriented x 3 Psychiatry: Mood appropriate,  Extremities: No pedal edema, no calf tenderness,   Data Review: I have personally reviewed the laboratory data and studies available.  F/u labs  Unresulted Labs (From admission, onward)     Start     Ordered   11/23/23 0500  Basic metabolic panel with GFR  Tomorrow morning,   R        11/22/23 1301   11/23/23 0500  CBC with Differential/Platelet  Tomorrow morning,   R        11/22/23 1301   11/23/23 0500  Lactic acid, plasma  (Lactic Acid)  Tomorrow morning,   R        11/22/23 1301   11/22/23 0226  Urinalysis, Routine w reflex microscopic -Urine, Clean Catch  Once,   R       Question:  Specimen Source  Answer:  Urine, Clean Catch   11/22/23 0225   11/22/23 0007  Expectorated Sputum Assessment w Gram Stain, Rflx to Resp Cult  (COPD / Pneumonia / Cellulitis / Lower Extremity Wound (Diabetic Foot Infection))  Once,   R       Question Answer Comment  Patient immune status Normal   Release to patient Immediate      11/22/23 0006            Total time spent in review of labs and imaging, patient evaluation, formulation of plan, documentation and communication with family: 55 minutes  Signed, Lorin Glass, MD [facility] 11/22/2023

## 2023-11-22 NOTE — Progress Notes (Addendum)
 Type B lactic acidosis-secondary to COPD exacerbation, chronic alcohol use reduced decrease  hepatic clearance and history of DM type II Lab called and RN informed that lactic acid persistently elevated 4.1 and 4. Patient is afebrile.  Respiratory panel negative for COVID, flu and RSV.  Normal procalcitonin level.  CBC no leukocytosis.  BMP no evidence of AKI however low bicarb level 20.  CTA chest no evidence of infection and pneumonia.   Patient has been admitted for COPD exacerbation. Found to have lactic acidosis given history of chronic alcohol use. Normal hepatic function panel.  Lactic acidosis secondary to multifactorial blood causes include in the setting of COPD exacerbation causing hypoxia, chronic alcohol use that which is decreasing hepatic clearance of lactic acid and history of DM type II which will increase the gut bacteria that produce lactic acid.  -Patient has been given 1 L of NS bolus.  Will continue maintenance fluids 125 cc/h.  Continue to monitor serial lactic acid level.  If patient develops fever and CBC showing leukocytosis in that case will obtain blood cultures.   Tereasa Coop, MD [facility] 11/22/2023, 2:24 AM

## 2023-11-22 NOTE — Progress Notes (Signed)
 SCD placed per order.  Incentive spirometry and flutter valve introduced with teach back.

## 2023-11-23 DIAGNOSIS — J441 Chronic obstructive pulmonary disease with (acute) exacerbation: Secondary | ICD-10-CM | POA: Diagnosis not present

## 2023-11-23 LAB — CBC WITH DIFFERENTIAL/PLATELET
Abs Immature Granulocytes: 0.36 10*3/uL — ABNORMAL HIGH (ref 0.00–0.07)
Basophils Absolute: 0 10*3/uL (ref 0.0–0.1)
Basophils Relative: 0 %
Eosinophils Absolute: 0 10*3/uL (ref 0.0–0.5)
Eosinophils Relative: 0 %
HCT: 38 % (ref 36.0–46.0)
Hemoglobin: 12.4 g/dL (ref 12.0–15.0)
Immature Granulocytes: 2 %
Lymphocytes Relative: 9 %
Lymphs Abs: 1.9 10*3/uL (ref 0.7–4.0)
MCH: 22.3 pg — ABNORMAL LOW (ref 26.0–34.0)
MCHC: 32.6 g/dL (ref 30.0–36.0)
MCV: 68.5 fL — ABNORMAL LOW (ref 80.0–100.0)
Monocytes Absolute: 0.7 10*3/uL (ref 0.1–1.0)
Monocytes Relative: 3 %
Neutro Abs: 17.6 10*3/uL — ABNORMAL HIGH (ref 1.7–7.7)
Neutrophils Relative %: 86 %
Platelets: 233 10*3/uL (ref 150–400)
RBC: 5.55 MIL/uL — ABNORMAL HIGH (ref 3.87–5.11)
RDW: 15.3 % (ref 11.5–15.5)
WBC: 20.5 10*3/uL — ABNORMAL HIGH (ref 4.0–10.5)
nRBC: 0.1 % (ref 0.0–0.2)

## 2023-11-23 LAB — URINALYSIS, ROUTINE W REFLEX MICROSCOPIC
Bilirubin Urine: NEGATIVE
Glucose, UA: 50 mg/dL — AB
Hgb urine dipstick: NEGATIVE
Ketones, ur: NEGATIVE mg/dL
Leukocytes,Ua: NEGATIVE
Nitrite: NEGATIVE
Protein, ur: NEGATIVE mg/dL
Specific Gravity, Urine: 1.015 (ref 1.005–1.030)
pH: 6 (ref 5.0–8.0)

## 2023-11-23 LAB — GLUCOSE, CAPILLARY
Glucose-Capillary: 139 mg/dL — ABNORMAL HIGH (ref 70–99)
Glucose-Capillary: 196 mg/dL — ABNORMAL HIGH (ref 70–99)

## 2023-11-23 LAB — BASIC METABOLIC PANEL WITH GFR
Anion gap: 9 (ref 5–15)
BUN: 11 mg/dL (ref 6–20)
CO2: 18 mmol/L — ABNORMAL LOW (ref 22–32)
Calcium: 8.8 mg/dL — ABNORMAL LOW (ref 8.9–10.3)
Chloride: 113 mmol/L — ABNORMAL HIGH (ref 98–111)
Creatinine, Ser: 0.76 mg/dL (ref 0.44–1.00)
GFR, Estimated: >60 mL/min (ref >60)
Glucose, Bld: 143 mg/dL — ABNORMAL HIGH (ref 70–99)
Potassium: 4.2 mmol/L (ref 3.5–5.1)
Sodium: 140 mmol/L (ref 135–145)

## 2023-11-23 LAB — LACTIC ACID, PLASMA: Lactic Acid, Venous: 1.6 mmol/L (ref 0.5–1.9)

## 2023-11-23 MED ORDER — PREDNISONE 10 MG PO TABS: ORAL_TABLET | ORAL | 0 refills | Status: DC

## 2023-11-23 MED ORDER — AMLODIPINE BESYLATE 5 MG PO TABS
5.0000 mg | ORAL_TABLET | Freq: Every day | ORAL | Status: DC
Start: 2023-11-24 — End: 2023-11-23

## 2023-11-23 MED ORDER — GUAIFENESIN ER 600 MG PO TB12: 600.0000 mg | ORAL_TABLET | Freq: Two times a day (BID) | ORAL | 0 refills | Status: DC

## 2023-11-23 MED ORDER — CARVEDILOL 3.125 MG PO TABS
3.1250 mg | ORAL_TABLET | Freq: Two times a day (BID) | ORAL | 0 refills | Status: DC
Start: 2023-11-23 — End: 2023-11-28

## 2023-11-23 MED ORDER — AMOXICILLIN-POT CLAVULANATE 875-125 MG PO TABS: 1.0000 | ORAL_TABLET | Freq: Two times a day (BID) | ORAL | 0 refills | Status: DC

## 2023-11-23 MED ORDER — CARVEDILOL 3.125 MG PO TABS: 3.1250 mg | ORAL_TABLET | Freq: Two times a day (BID) | ORAL | Status: DC

## 2023-11-23 MED ORDER — CARVEDILOL 3.125 MG PO TABS
3.1250 mg | ORAL_TABLET | Freq: Two times a day (BID) | ORAL | Status: DC
  Administered 2023-11-23: 3.125 mg via ORAL
  Filled 2023-11-23: qty 1

## 2023-11-23 NOTE — Progress Notes (Signed)
 On room air, Patient's SPO2 at rest is 98% , HR 107/min.  Ambulatory SPO2 is 98% , HR 119/min.  Will continue to monitor

## 2023-11-23 NOTE — Progress Notes (Signed)
 CM noted that patient was able to ambulate without need for home oxygen. Resources for smoking cessation placed in the AVS.  No TOC needs prior to discharge to home.

## 2023-11-23 NOTE — Plan of Care (Signed)

## 2023-11-23 NOTE — Discharge Summary (Signed)
 Physician Discharge Summary  Amanda Garner ZOX:[id number] DOB: 1971/06/25 DOA: 11/21/2023  PCP: Amanda Register, MD  Admit date: 11/21/2023 Discharge date: 11/23/2023  Admitted From: Home Discharge disposition: Home  Recommendations at discharge:  Complete the course of antibiotics with 5 more days of oral Augmentin.  Continue tapering course of prednisone, Mucinex and bronchodilators. Follow-up with PCP as an outpatient for thyroid ultrasound   Brief narrative: Amanda Garner is a 53 y.o. female with PMH significant for HTN, GERD, Crohn's disease, chronic smoking. 3/31, patient presented to the ED with complaint of progressively worsening shortness of breath for 3 weeks associated with nonproductive cough. She was first seen in the ED on 3/16 for same symptoms and was discharged on steroids and albuterol inhaler.  She completed a course of prednisone disparate continue to have symptoms.  Her symptoms got worse to a point where she had to use the inhaler for more than 50 times in 24 hours and hence decided to come to the ED. In the ED, patient was afebrile, tachycardic to 110s, tachypneic to 20s, breathing on room air at rest, blood pressure 130s. Labs showed WC count 9.3, hemoglobin 13.5, BUN/creatinine normal, procalcitonin not elevated,  lactic acid elevated to 4.1>4>2.4 Respiratory virus panel unremarkable. CT angio chest was negative for acute pulm embolism.  It showed bronchial wall thickening and air trapping compatible with bronchitis/reactive airways.  Admitted to [facility]  Subjective: Patient was seen and examined this morning.   Propped up in bed.  Not in distress.  Not on supplemental oxygen. Intermittent dry cough present. We discussed about the studies so far.  Echocardiogram yesterday showed normal EF. Later this morning, patient walked in the hallway.  Maintained O2 sat over 95% without supplemental oxygen.  Assessment and plan: Acute bronchitis Presented with  bronchitis symptoms for 3 weeks despite completion of course of prednisone and use of inhalers. Smokes 1 to 2 cigarettes a day but denies any prior diagnosis of COPD or emphysema. On admission, patient was started on an empiric course of IV antibiotics, steroids, bronchodilators, Mucinex. CT angio chest on which did not show any pulm embolism, showed bronchial wall thickening and air trapping compatible with bronchitis. 4/1, echocardiogram showed normal EF Still has cough on deep breathing but overall improving respiratory status.  This morning, patient walked in the hallway.  Maintained O2 sat over 95% without supplemental oxygen. Will discharge her on 5 more days of oral Augmentin and a tapering course of prednisone.  Continue Mucinex and bronchodilators.  Lactic acidosis On admission WBC count was normal, procalcitonin level was not elevated but lactic acid level was elevated to 4.1 No evidence of sepsis. Echocardiogram ruled out low output CHF.   Lactic acid level has normalized today. Leukocytosis this morning is likely due to steroids. Recent Labs  Lab 11/21/23 1530 11/21/23 1532 11/21/23 2212 11/22/23 0050 11/22/23 0653 11/23/23 0620  WBC 9.3  --   --   --  9.9 20.5*  LATICACIDVEN  --   --  4.1* 4.0* 2.4* 1.6  PROCALCITON  --  <0.10  --   --   --   --    Hypertension Was on amlodipine 10 mg daily. BP running elevated. Coreg 3.125 mg added. Continue to monitor BP as an outpatient.   Type 2 diabetes mellitus controlled Hyperglycemia due to steroids Peripheral neuropathy A1c 5.5 on 11/21/2023 PTA meds-Mounjaro Hyperglycemia likely due to steroids.    GERD Protonix  Chronic daily smoker Says she smokes 1 to 2 cigarettes a  day. Counseled to quit.  Nicotine patch offered  patient states she was trying bupropion at home but does not think it works for her.  Prolonged QT interval EKG with QTc 506 ms  Monitor electrolytes.   Repeat EKG    Thyroid nodule CT angio chest  also showed 2.7 cm nodule inferior to the left thyroid in the superior mediastinum. This may represent a thyroid nodule, parathyroid adenoma, or enlarged lymph node. Recommend further evaluation with nonemergent thyroid ultrasound. Follow-up with PCP as an outpatient for further workup.   Goals of care   Code Status: Full Code   Diet:  Diet Order             Diet general           Diet Heart Room service appropriate? Yes; Fluid consistency: Thin  Diet effective now                   Nutritional status:  Body mass index is 30.6 kg/m.       Wounds:  - Incision - 5 Ports Abdomen Left;Upper Right;Upper Right Right;Lower Right;Lateral (Active)  Placement Date/Time: 11/04/21 0930   Location of Ports: Abdomen  Location Orientation: Left;Upper  Location Orientation: Right;Upper  Location Orientation: Right  Location Orientation: Right;Lower  Location Orientation: Right;Lateral    Assessments 11/04/2021 10:58 AM 11/06/2021  8:00 AM  Port 1 Site Assessment -- [employer] 1 Margins -- Attached edges (approximated)  Port 1 Drainage Amount -- None  Port 1 Dressing Type Liquid skin adhesive None  Port 2 Site Assessment -- Clean;Dry  Port 2 Margins -- Attached edges (approximated)  Port 2 Drainage Amount -- None  Port 2 Dressing Type Liquid skin adhesive Liquid skin adhesive  Port 2 Dressing Status -- Clean, Dry, Intact  Port 3 Site Assessment -- Clean;Dry  Port 3 Margins -- Attached edges (approximated)  Port 3 Drainage Amount -- None  Port 3 Dressing Type Liquid skin adhesive Liquid skin adhesive  Port 3 Dressing Status -- Clean, Dry, Intact  Port 4 Site Assessment -- Clean;Dry  Port 4 Margins -- Attached edges (approximated)  Port 4 Drainage Amount -- None  Port 4 Dressing Type Liquid skin adhesive Liquid skin adhesive  Port 4 Dressing Status -- Clean, Dry, Intact  Port 5 Site Assessment -- Clean;Dry  Port 5 Margins -- Attached edges (approximated)  Port 5 Drainage Amount  -- None  Port 5 Dressing Type Liquid skin adhesive Liquid skin adhesive  Port 5 Dressing Status -- Clean, Dry, Intact     No associated orders.    Discharge Exam:   Vitals:   11/23/23 0208 11/23/23 0523 11/23/23 0800 11/23/23 0920  BP: (!) 136/91 (!) 129/92 (!) 136/98   Pulse: (!) 103 (!) 105 (!) 104 (!) 102  Resp: 18 18  18   Temp: 98.4 F (36.9 C) 98 F (36.7 C) 98.2 F (36.8 C)   TempSrc: Oral Oral Oral   SpO2: 95% 97% 97% 98%  Weight:      Height:        Body mass index is 30.6 kg/m.  General exam: Pleasant, middle-aged African-American female Skin: No rashes, lesions or ulcers. HEENT: Atraumatic, normocephalic, no obvious bleeding Lungs: No crackles or wheezing. Coughs on deep breathing but less intense than yesterday. CVS: S1, S2, no murmur,   GI/Abd: Soft, nontender, nondistended, bowel sound present,   CNS: Alert, awake, oriented x 3 Psychiatry: Mood appropriate,  Extremities: No pedal edema, no calf tenderness,  Follow ups:    Follow-up Information     Amanda Register, MD Follow up.   Specialty: Family Medicine Contact information: [address] [place] [address] [place] [address] [phone]                 Discharge Instructions:   Discharge Instructions     Call MD for:  difficulty breathing, headache or visual disturbances   Complete by: As directed    Call MD for:  extreme fatigue   Complete by: As directed    Call MD for:  hives   Complete by: As directed    Call MD for:  persistant dizziness or light-headedness   Complete by: As directed    Call MD for:  persistant nausea and vomiting   Complete by: As directed    Call MD for:  severe uncontrolled pain   Complete by: As directed    Call MD for:  temperature >100.4   Complete by: As directed    Diet general   Complete by: As directed    Discharge instructions   Complete by: As directed    Recommendations at discharge:   Complete the course of antibiotics with 5 more  days of oral Augmentin.   Continue tapering course of prednisone, Mucinex and bronchodilators.  Follow-up with PCP as an outpatient for thyroid ultrasound  General discharge instructions: Follow with Primary MD Amanda Register, MD in 7 days  Please request your PCP  to go over your hospital tests, procedures, radiology results at the follow up. Please get your medicines reviewed and adjusted.  Your PCP may decide to repeat certain labs or tests as needed. Do not drive, operate heavy machinery, perform activities at heights, swimming or participation in water activities or provide baby sitting services if your were admitted for syncope or siezures until you have seen by Primary MD or a Neurologist and advised to do so again. [place] Controlled Substance Reporting System database was reviewed. Do not drive, operate heavy machinery, perform activities at heights, swim, participate in water activities or provide baby-sitting services while on medications for pain, sleep and mood until your outpatient physician has reevaluated you and advised to do so again.  You are strongly recommended to comply with the dose, frequency and duration of prescribed medications. Activity: As tolerated with Full fall precautions use walker/cane & assistance as needed Avoid using any recreational substances like cigarette, tobacco, alcohol, or non-prescribed drug. If you experience worsening of your admission symptoms, develop shortness of breath, life threatening emergency, suicidal or homicidal thoughts you must seek medical attention immediately by calling 911 or calling your MD immediately  if symptoms less severe. You must read complete instructions/literature along with all the possible adverse reactions/side effects for all the medicines you take and that have been prescribed to you. Take any new medicine only after you have completely understood and accepted all the possible adverse reactions/side effects.   Wear Seat belts while driving. You were cared for by a hospitalist during your hospital stay. If you have any questions about your discharge medications or the care you received while you were in the hospital after you are discharged, you can call the unit and ask to speak with the hospitalist or the covering physician. Once you are discharged, your primary care physician will handle any further medical issues. Please note that NO REFILLS for any discharge medications will be authorized once you are discharged, as it is imperative that you return to your primary  care physician (or establish a relationship with a primary care physician if you do not have one).   Increase activity slowly   Complete by: As directed        Discharge Medications:   Allergies as of 11/23/2023   No Known Allergies      Medication List     STOP taking these medications    azithromycin 250 MG tablet Commonly known as: ZITHROMAX       TAKE these medications    Accu-Chek FastClix Lancets Misc Use as directed to check blood glucose once a day before breakfast.   Accu-Chek Guide Me w/Device Kit Use as directed to check blood glucose once a day before breakfast.   Accu-Chek Guide test strip Generic drug: glucose blood Use to test blood glucose daily before breakfast.   albuterol 108 (90 Base) MCG/ACT inhaler Commonly known as: VENTOLIN HFA INHALE 1-2 PUFFS BY MOUTH EVERY 6 HOURS AS NEEDED FOR WHEEZE OR SHORTNESS OF BREATH   amLODipine 10 MG tablet Commonly known as: NORVASC Take 1 tablet (10 mg total) by mouth daily.   amoxicillin-clavulanate 875-125 MG tablet Commonly known as: AUGMENTIN Take 1 tablet by mouth 2 (two) times daily for 5 days.   buPROPion 150 MG 24 hr tablet Commonly known as: WELLBUTRIN XL Take 150 mg by mouth daily.   carvedilol 3.125 MG tablet Commonly known as: COREG Take 1 tablet (3.125 mg total) by mouth 2 (two) times daily.   fluticasone-salmeterol 250-50 MCG/ACT  Aepb Commonly known as: Wixela Inhub Inhale 1 puff into the lungs in the morning and at bedtime.   gabapentin 300 MG capsule Commonly known as: NEURONTIN Take 1 capsule (300 mg total) by mouth at bedtime.   guaiFENesin 600 MG 12 hr tablet Commonly known as: MUCINEX Take 1 tablet (600 mg total) by mouth 2 (two) times daily for 7 days.   Mounjaro 15 MG/0.5ML Pen Generic drug: tirzepatide Inject 15 mg into the skin once a week.   naproxen 500 MG tablet Commonly known as: NAPROSYN TAKE 1 TABLET(500 MG) BY MOUTH TWICE DAILY WITH A MEAL What changed: See the new instructions.   nicotine 7 mg/24hr patch Commonly known as: Nicoderm CQ Place 1 patch (7 mg total) onto the skin daily.   ondansetron 4 MG disintegrating tablet Commonly known as: ZOFRAN-ODT Take 1 tablet (4 mg total) by mouth every 8 (eight) hours as needed for nausea or vomiting.   predniSONE 10 MG tablet Commonly known as: DELTASONE Take 4 tablets daily X 2 days, then, Take 3 tablets daily X 2 days, then, Take 2 tablets daily X 2 days, then, Take 1 tablets daily X 1 day. What changed:  medication strength how much to take how to take this when to take this additional instructions   traMADol 50 MG tablet Commonly known as: Ultram Take 1 tablet (50 mg total) by mouth at bedtime as needed.         The results of significant diagnostics from this hospitalization (including imaging, microbiology, ancillary and laboratory) are listed below for reference.    Procedures and Diagnostic Studies:   ECHOCARDIOGRAM COMPLETE Result Date: 11/22/2023    ECHOCARDIOGRAM REPORT   Patient Name:   Amanda Garner Date of Exam: 11/22/2023 Medical Rec #:  [id number]       Height:       66.0 in Accession #:    [id number]      Weight:       189.6 lb Date of Birth:  10/01/1970       BSA:          1.955 m Patient Age:    52 years        BP:           158/95 mmHg Patient Gender: F               HR:           111 bpm. Exam Location:   Inpatient Procedure: 2D Echo, Color Doppler and Cardiac Doppler (Both Spectral and Color            Flow Doppler were utilized during procedure). Indications:    Abnormal ECG  History:        Patient has no prior history of Echocardiogram examinations.                 COPD; Risk Factors:Hypertension, Diabetes and Current Smoker.  Sonographer:    Lamont Snowball Referring Phys: [id number] Diego Delancey IMPRESSIONS  1. Left ventricular ejection fraction, by estimation, is 65 to 70%. The left ventricle has normal function. The left ventricle has no regional wall motion abnormalities. There is mild concentric left ventricular hypertrophy. Left ventricular diastolic parameters are consistent with Grade I diastolic dysfunction (impaired relaxation).  2. Right ventricular systolic function is normal. The right ventricular size is normal.  3. The mitral valve is normal in structure. Trivial mitral valve regurgitation. No evidence of mitral stenosis.  4. The aortic valve is normal in structure. Aortic valve regurgitation is not visualized. No aortic stenosis is present.  5. The inferior vena cava is normal in size with greater than 50% respiratory variability, suggesting right atrial pressure of 3 mmHg. FINDINGS  Left Ventricle: Left ventricular ejection fraction, by estimation, is 65 to 70%. The left ventricle has normal function. The left ventricle has no regional wall motion abnormalities. The left ventricular internal cavity size was normal in size. There is  mild concentric left ventricular hypertrophy. Left ventricular diastolic parameters are consistent with Grade I diastolic dysfunction (impaired relaxation). Right Ventricle: The right ventricular size is normal. No increase in right ventricular wall thickness. Right ventricular systolic function is normal. Left Atrium: Left atrial size was normal in size. Right Atrium: Right atrial size was normal in size. Pericardium: There is no evidence of pericardial effusion. Mitral  Valve: The mitral valve is normal in structure. Trivial mitral valve regurgitation. No evidence of mitral valve stenosis. MV peak gradient, 10.2 mmHg. The mean mitral valve gradient is 5.0 mmHg. Tricuspid Valve: The tricuspid valve is normal in structure. Tricuspid valve regurgitation is trivial. No evidence of tricuspid stenosis. Aortic Valve: The aortic valve is normal in structure. Aortic valve regurgitation is not visualized. No aortic stenosis is present. Aortic valve peak gradient measures 16.0 mmHg. Pulmonic Valve: The pulmonic valve was normal in structure. Pulmonic valve regurgitation is trivial. No evidence of pulmonic stenosis. Aorta: The aortic root is normal in size and structure. Venous: The inferior vena cava is normal in size with greater than 50% respiratory variability, suggesting right atrial pressure of 3 mmHg. IAS/Shunts: No atrial level shunt detected by color flow Doppler.  LEFT VENTRICLE PLAX 2D LVIDd:         4.00 cm   Diastology LVIDs:         2.60 cm   LV e' medial:    4.79 cm/s LV PW:         1.30 cm   LV E/e' medial:  17.9 LV IVS:  1.40 cm   LV e' lateral:   13.30 cm/s LVOT diam:     1.90 cm   LV E/e' lateral: 6.5 LVOT Area:     2.84 cm  RIGHT VENTRICLE             IVC RV Basal diam:  3.10 cm     IVC diam: 1.80 cm RV S prime:     12.60 cm/s TAPSE (M-mode): 2.7 cm LEFT ATRIUM             Index        RIGHT ATRIUM           Index LA Vol (A2C):   41.4 ml 21.18 ml/m  RA Area:     15.40 cm LA Vol (A4C):   48.8 ml 24.96 ml/m  RA Volume:   39.00 ml  19.95 ml/m LA Biplane Vol: 45.2 ml 23.12 ml/m  AORTIC VALVE AV Vmax:      200.00 cm/s AV Peak Grad: 16.0 mmHg  AORTA Ao Root diam: 2.90 cm Ao Asc diam:  3.30 cm MITRAL VALVE MV Area (PHT): 5.31 cm     SHUNTS MV Peak grad:  10.2 mmHg    Systemic Diam: 1.90 cm MV Mean grad:  5.0 mmHg MV Vmax:       1.60 m/s MV Vmean:      104.0 cm/s MV Decel Time: 143 msec MV E velocity: 85.90 cm/s MV A velocity: 132.00 cm/s MV E/A ratio:  0.65 Arvilla Meres MD Electronically signed by Arvilla Meres MD Signature Date/Time: 11/22/2023/2:55:16 PM    Final    CT Angio Chest Pulmonary Embolism (PE) W or WO Contrast Result Date: 11/21/2023 CLINICAL DATA:  Shortness of breath and cough.  PE suspected EXAM: CT ANGIOGRAPHY CHEST WITH CONTRAST TECHNIQUE: Multidetector CT imaging of the chest was performed using the standard protocol during bolus administration of intravenous contrast. Multiplanar CT image reconstructions and MIPs were obtained to evaluate the vascular anatomy. RADIATION DOSE REDUCTION: This exam was performed according to the departmental dose-optimization program which includes automated exposure control, adjustment of the mA and/or kV according to patient size and/or use of iterative reconstruction technique. CONTRAST:  75mL OMNIPAQUE IOHEXOL 350 MG/ML SOLN COMPARISON:  Same day chest radiograph FINDINGS: Cardiovascular: Negative for acute pulmonary embolism. Normal caliber thoracic aorta. No pericardial effusion. Mediastinum/Nodes: Trachea and esophagus are unremarkable. No thoracic adenopathy. 2.7 by 1.9 cm nodule inferior to the left thyroid in the superior mediastinum (series 7/image 70). Lungs/Pleura: No pleural effusion or pneumothorax. Bibasilar atelectasis/scarring. Mild diffuse bronchial wall thickening. Mosaic attenuation of the lungs compatible with air trapping. Upper Abdomen: No acute abnormality. Musculoskeletal: No acute fracture. Review of the MIP images confirms the above findings. IMPRESSION: 1. Negative for acute pulmonary embolism. 2. Bronchial wall thickening and air trapping compatible with bronchitis/reactive airways. 3. 2.7 cm nodule inferior to the left thyroid in the superior mediastinum. This may represent a thyroid nodule, parathyroid adenoma, or enlarged lymph node. Recommend further evaluation with nonemergent thyroid ultrasound. Electronically Signed   By: Minerva Fester M.D.   On: 11/21/2023 23:31   DG Chest 2  View Result Date: 11/21/2023 CLINICAL DATA:  Shortness of breath and cough. EXAM: CHEST - 2 VIEW COMPARISON:  11/06/2023. FINDINGS: Trachea is midline. Heart size normal. Streaky atelectasis or scarring in lung bases. No airspace consolidation or pleural fluid. IMPRESSION: Bibasilar streaky atelectasis or scarring. Electronically Signed   By: Leanna Battles M.D.   On: 11/21/2023 16:36  Labs:   Basic Metabolic Panel: Recent Labs  Lab 11/21/23 1530 11/21/23 1532 11/22/23 0653 11/23/23 0620  NA 144  --  141 140  K 3.6  --  3.7 4.2  CL 114*  --  114* 113*  CO2 20*  --  19* 18*  GLUCOSE 126*  --  177* 143*  BUN 12  --  11 11  CREATININE 0.81  --  0.74 0.76  CALCIUM 9.6  --  9.1 8.8*  MG  --  2.0 2.3  --   PHOS  --  2.8 2.4*  --    GFR Estimated Creatinine Clearance: 89.9 mL/min (by C-G formula based on SCr of 0.76 mg/dL). Liver Function Tests: Recent Labs  Lab 11/21/23 1532 11/22/23 0653  AST 23 18  ALT 24 18  ALKPHOS 80 67  BILITOT 0.5 0.3  PROT 7.0 6.4*  ALBUMIN 3.9 3.4*   No results for input(s): "LIPASE", "AMYLASE" in the last 168 hours. No results for input(s): "AMMONIA" in the last 168 hours. Coagulation profile No results for input(s): "INR", "PROTIME" in the last 168 hours.  CBC: Recent Labs  Lab 11/21/23 1530 11/22/23 0653 11/23/23 0620  WBC 9.3 9.9 20.5*  NEUTROABS 4.1  --  17.6*  HGB 13.5 11.8* 12.4  HCT 42.8 37.0 38.0  MCV 70.0* 69.3* 68.5*  PLT 282 228 233   Cardiac Enzymes: Recent Labs  Lab 11/21/23 1532  CKTOTAL 173   BNP: Invalid input(s): "POCBNP" CBG: Recent Labs  Lab 11/22/23 1243 11/22/23 1535 11/22/23 1922 11/23/23 0715 11/23/23 1139  GLUCAP 164* 219* 169* 139* 196*   D-Dimer No results for input(s): "DDIMER" in the last 72 hours. Hgb A1c Recent Labs    11/21/23 1532  HGBA1C 5.5   Lipid Profile No results for input(s): "CHOL", "HDL", "LDLCALC", "TRIG", "CHOLHDL", "LDLDIRECT" in the last 72 hours. Thyroid function  studies Recent Labs    11/21/23 1530  TSH 0.547   Anemia work up No results for input(s): "VITAMINB12", "FOLATE", "FERRITIN", "TIBC", "IRON", "RETICCTPCT" in the last 72 hours. Microbiology Recent Results (from the past 240 hours)  Resp panel by RT-PCR (RSV, Flu A&B, Covid) Anterior Nasal Swab     Status: None   Collection Time: 11/21/23  6:27 PM   Specimen: Anterior Nasal Swab  Result Value Ref Range Status   SARS Coronavirus 2 by RT PCR NEGATIVE NEGATIVE Final   Influenza A by PCR NEGATIVE NEGATIVE Final   Influenza B by PCR NEGATIVE NEGATIVE Final    Comment: (NOTE) The Xpert Xpress SARS-CoV-2/FLU/RSV plus assay is intended as an aid in the diagnosis of influenza from Nasopharyngeal swab specimens and should not be used as a sole basis for treatment. Nasal washings and aspirates are unacceptable for Xpert Xpress SARS-CoV-2/FLU/RSV testing.  Fact Sheet for Patients: BloggerCourse.com  Fact Sheet for Healthcare Providers: SeriousBroker.it  This test is not yet approved or cleared by the [place] FDA and has been authorized for detection and/or diagnosis of SARS-CoV-2 by FDA under an Emergency Use Authorization (EUA). This EUA will remain in effect (meaning this test can be used) for the duration of the COVID-19 declaration under Section 564(b)(1) of the Act, 21 U.S.C. section 360bbb-3(b)(1), unless the authorization is terminated or revoked.     Resp Syncytial Virus by PCR NEGATIVE NEGATIVE Final    Comment: (NOTE) Fact Sheet for Patients: BloggerCourse.com  Fact Sheet for Healthcare Providers: SeriousBroker.it  This test is not yet approved or cleared by the [place] and  has been authorized for detection and/or diagnosis of SARS-CoV-2 by FDA under an Emergency Use Authorization (EUA). This EUA will remain in effect (meaning this test can be used) for the  duration of the COVID-19 declaration under Section 564(b)(1) of the Act, 21 U.S.C. section 360bbb-3(b)(1), unless the authorization is terminated or revoked.  Performed at [facility], [address], [place], [place] [address]   Respiratory (~20 pathogens) panel by PCR     Status: None   Collection Time: 11/22/23 12:07 AM   Specimen: Nasopharyngeal Swab; Respiratory  Result Value Ref Range Status   Adenovirus NOT DETECTED NOT DETECTED Final   Coronavirus 229E NOT DETECTED NOT DETECTED Final    Comment: (NOTE) The Coronavirus on the Respiratory Panel, DOES NOT test for the novel  Coronavirus (2019 nCoV)    Coronavirus HKU1 NOT DETECTED NOT DETECTED Final   Coronavirus NL63 NOT DETECTED NOT DETECTED Final   Coronavirus OC43 NOT DETECTED NOT DETECTED Final   Metapneumovirus NOT DETECTED NOT DETECTED Final   Rhinovirus / Enterovirus NOT DETECTED NOT DETECTED Final   Influenza A NOT DETECTED NOT DETECTED Final   Influenza B NOT DETECTED NOT DETECTED Final   Parainfluenza Virus 1 NOT DETECTED NOT DETECTED Final   Parainfluenza Virus 2 NOT DETECTED NOT DETECTED Final   Parainfluenza Virus 3 NOT DETECTED NOT DETECTED Final   Parainfluenza Virus 4 NOT DETECTED NOT DETECTED Final   Respiratory Syncytial Virus NOT DETECTED NOT DETECTED Final   Bordetella pertussis NOT DETECTED NOT DETECTED Final   Bordetella Parapertussis NOT DETECTED NOT DETECTED Final   Chlamydophila pneumoniae NOT DETECTED NOT DETECTED Final   Mycoplasma pneumoniae NOT DETECTED NOT DETECTED Final    Comment: Performed at [facility], [address] [place], [place] [address]    Time coordinating discharge: 45 minutes  Signed: Irmgard Rampersaud  [facility] 11/23/2023, 1:27 PM

## 2023-11-24 ENCOUNTER — Telehealth: Payer: Self-pay

## 2023-11-24 DIAGNOSIS — J441 Chronic obstructive pulmonary disease with (acute) exacerbation: Secondary | ICD-10-CM

## 2023-11-24 NOTE — Transitions of Care (Post Inpatient/ED Visit) (Signed)
 11/24/2023  Name: Amanda Garner MRN: [id number] DOB: 07-17-1971  Today's TOC FU Call Status: Today's TOC FU Call Status:: Successful TOC FU Call Completed TOC FU Call Complete Date: 11/24/23 Patient's Name and Date of Birth confirmed.  Transition Care Management Follow-up Telephone Call Date of Discharge: 11/23/23 Discharge Facility: [facility]) Type of Discharge: Inpatient Admission How have you been since you were released from the hospital?: Better Any questions or concerns?: No  Items Reviewed: Did you receive and understand the discharge instructions provided?: No Any new allergies since your discharge?: No Dietary orders reviewed?: Yes Type of Diet Ordered:: Diabetic Do you have support at home?: Yes People in Home: spouse Name of Support/Comfort Primary Source: Skilton,[name]  [phone] (Mobile)  Medications Reviewed Today: Medications Reviewed Today     Reviewed by Bing Quarry, RN (Case Manager) on 11/24/23 at 1102  Med List Status: <None>   Medication Order Taking? Sig Documenting Provider Last Dose Status Informant  Accu-Chek FastClix Lancets MISC [id number] No Use as directed to check blood glucose once a day before breakfast.  Patient not taking: Reported on 11/24/2023   Hoy Register, MD Not Taking Active Self  albuterol (VENTOLIN HFA) 108 (90 Base) MCG/ACT inhaler [id number] Yes INHALE 1-2 PUFFS BY MOUTH EVERY 6 HOURS AS NEEDED FOR WHEEZE OR SHORTNESS OF Clarisa Fling, MD Taking Active Self, Pharmacy Records  amLODipine (NORVASC) 10 MG tablet [id number] Yes Take 1 tablet (10 mg total) by mouth daily. Hoy Register, MD Taking Active Self, Pharmacy Records  amoxicillin-clavulanate (AUGMENTIN) 875-125 MG tablet [id number] Yes Take 1 tablet by mouth 2 (two) times daily for 5 days. Lorin Glass, MD Taking Active   Blood Glucose Monitoring Suppl (BLOOD GLUCOSE MONITOR SYSTEM) w/Device KIT [id number] No Use as directed to check blood glucose  once a day before breakfast.  Patient not taking: Reported on 11/24/2023   Narda Bonds, MD Not Taking Active Self  buPROPion (WELLBUTRIN XL) 150 MG 24 hr tablet [id number] Yes Take 150 mg by mouth daily. [provider] Taking Active Self           Med Note (WHITE, [place] Nov 21, 2023 11:07 PM) Was prescribed over 6 months ago and was never taken. But restarted, hoping it would help her quit smoking.  carvedilol (COREG) 3.125 MG tablet [id number] Yes Take 1 tablet (3.125 mg total) by mouth 2 (two) times daily. Lorin Glass, MD Taking Active   fluticasone-salmeterol [facility] INHUB) 250-50 MCG/ACT AEPB [id number] No Inhale 1 puff into the lungs in the morning and at bedtime.  Patient not taking: Reported on 11/21/2023   Mecum, Oswaldo Conroy, PA-C Not Taking Active Self, Pharmacy Records  gabapentin (NEURONTIN) 300 MG capsule [id number]  Take 1 capsule (300 mg total) by mouth at bedtime. Magnant, Joycie Peek, PA-C  Active Self, Pharmacy Records  glucose blood test strip [id number] No Use to test blood glucose daily before breakfast.  Patient not taking: Reported on 11/24/2023   Narda Bonds, MD Not Taking Active Self, Pharmacy Records  guaiFENesin (MUCINEX) 600 MG 12 hr tablet [id number] Yes Take 1 tablet (600 mg total) by mouth 2 (two) times daily for 7 days. Lorin Glass, MD Taking Active   naproxen (NAPROSYN) 500 MG tablet [id number] Yes TAKE 1 TABLET(500 MG) BY MOUTH TWICE DAILY WITH A MEAL  Patient taking differently: Take 500 mg by mouth daily as needed for moderate pain (pain score 4-6).   Hoy Register, MD Taking Active Self, Pharmacy  Records  nicotine (NICODERM CQ) 7 mg/24hr patch [id number] No Place 1 patch (7 mg total) onto the skin daily.  Patient not taking: Reported on 11/21/2023   Hoy Register, MD Not Taking Active Self, Pharmacy Records  ondansetron (ZOFRAN-ODT) 4 MG disintegrating tablet [id number] Yes Take 1 tablet (4 mg total) by mouth every 8 (eight) hours as needed for  nausea or vomiting. Mecum, Oswaldo Conroy, PA-C Taking Active Self, Pharmacy Records  predniSONE (DELTASONE) 10 MG tablet [id number] Yes Take 4 tablets daily X 2 days, then, Take 3 tablets daily X 2 days, then, Take 2 tablets daily X 2 days, then, Take 1 tablets daily X 1 day. Lorin Glass, MD Taking Active   tirzepatide [facility]) 15 MG/0.5ML Pen [id number] Yes Inject 15 mg into the skin once a week. Hoy Register, MD Taking Active Self, Pharmacy Records  traMADol (ULTRAM) 50 MG tablet [id number] Yes Take 1 tablet (50 mg total) by mouth at bedtime as needed. Magnant, Joycie Peek, PA-C Taking Active Self, Pharmacy Records            Home Care and Equipment/Supplies: Were Home Health Services Ordered?: No Any new equipment or medical supplies ordered?: No (See PCP for CPAP.)  Functional Questionnaire: Do you need assistance with bathing/showering or dressing?: No Do you need assistance with meal preparation?: No Do you need assistance with eating?: No Do you have difficulty maintaining continence: No Do you need assistance with getting out of bed/getting out of a chair/moving?: No Do you have difficulty managing or taking your medications?: No  Follow up appointments reviewed: PCP Follow-up appointment confirmed?: Yes Date of PCP follow-up appointment?: 11/28/23 Specialist Hospital Follow-up appointment confirmed?: NA Do you need transportation to your follow-up appointment?: Yes Do you understand care options if your condition(s) worsen?: Yes-patient verbalized understanding  SDOH Interventions Today    Flowsheet Row Most Recent Value  SDOH Interventions   Food Insecurity Interventions AMB Referral, Other (Comment)  [Gave patient phone number call insurance for andy additional benefits.]  Housing Interventions Intervention Not Indicated  [11/23/23: Post discharge follow up call: Patient just moved into a new location as last rental had mold making her conditon worse. She says she feels stable  for housing for the next year.]  Transportation Interventions AMB Referral, [employer] Provided, Patient Resources (Friends/Family), Payor Benefit, Other (Comment)  [Patient stated she is taking paperwork to PCP visit to [facility] as she is unable to take buses to due COPD/SOB on exertion and the distance between home and bus station. Discussed ambulatory referral to [facility] Care Guides and verbal  permission granted.]  Utilities Interventions Intervention Not Indicated      Interventions Today    Flowsheet Row Most Recent Value  Chronic Disease   Chronic disease during today's visit Diabetes, Chronic Obstructive Pulmonary Disease (COPD)  General Interventions   General Interventions Discussed/Reviewed Annual Eye Exam, Doctor Visits, General Interventions Discussed  Doctor Visits Discussed/Reviewed Doctor Visits Discussed, Doctor Visits Reviewed, PCP  PCP/Specialist Visits Compliance with follow-up visit  Exercise Interventions   Exercise Discussed/Reviewed Physical Activity  Physical Activity Discussed/Reviewed Physical Activity Discussed  Education Interventions   Education Provided Provided Education  Provided Verbal Education On Blood Sugar Monitoring, Eye Care, Medication, When to see the doctor, [employer], Insurance Plans  Nutrition Interventions   Nutrition Discussed/Reviewed Nutrition Discussed, Decreasing sugar intake, Decreasing fats  Pharmacy Interventions   Pharmacy Dicussed/Reviewed Pharmacy Topics Discussed, Medications and their functions  Safety Interventions   Safety Discussed/Reviewed Home Safety  Advanced Directive  Interventions   Advanced Directives Discussed/Reviewed --  [Declined at this time.]        Goals Addressed               This Visit's Progress     Transitions of care (pt-stated)        Current Barriers:  Knowledge Deficits related to plan of care for management of COPD  Care Coordination needs related to Transportation and  Limited access to food Chronic Disease Management support and education needs related to COPD  Transportation barriers  RNCM Clinical Goal(s):  Patient will work with the Care Management team over the next 30 days to address Transition of Care Barriers: Transportation take all medications exactly as prescribed and will call provider for medication related questions as evidenced by adherence and compliance with prescribed medication schedule.   attend all scheduled medical appointments: with PCP as evidenced by not missing any scheduled appointments and no 30 day readmissions to hospital  work with Child psychotherapist to address  related to the Financial planner, Limited access to food, [employer] knowledge of community resource: related to food and transportation, and insurance benefits  related to the management of COPD and DMII as evidenced by review of EMR and patient or Child psychotherapist report through collaboration with Medical illustrator, provider, and care team.   Interventions: Evaluation of current treatment plan related to  self management and patient's adherence to plan as established by provider   COPD Interventions:  (Status:  New goal.) Short Term Goal Provided instruction about proper use of medications used for management of COPD including inhalers Discussed the importance of adequate rest and management of fatigue with COPD Referral made to community resources care guide team for assistance with Transportation to medical appointments, [employer] resources. Assessed social determinant of health barriers   Diabetes Interventions:  (Status:  New goal.) Short Term Goal Assessed patient's understanding of A1c goal:  Patient to request PCP to obtain updated A1c value or check blood sugar as a steroids Last reported was 5.5, see below.  Reviewed medications with patient and discussed importance of medication adherence Counseled on importance of regular laboratory monitoring as  prescribed Reviewed scheduled/upcoming provider appointments including: PCP appointment scheduled 11/28/23 and transportation issues. Can take Lyft as last resort but costly Referral made to social work team for assistance with SDOH needs, community resources, benefit resources.  Referral made to community resources care guide team for assistance with Transportation, community resources, food resources.  Assessed social determinant of health barriers Patient will inquire at PCP appointment on 11/28/23 about continuous blood glucose monitoring system/kit as patient has not been checking blood sugars and is out of equipment. Lab Results  Component Value Date   HGBA1C 5.5 11/21/2023    Patient Goals/Self-Care Activities: Participate in Transition of Care Program/Attend [facility] scheduled calls Notify RN Care Manager of [facility] call rescheduling needs Take all medications as prescribed Attend all scheduled provider appointments Call pharmacy for medication refills 3-7 days in advance of running out of medications Call provider office for new concerns or questions  Work with the social worker to address care coordination needs and will continue to work with the clinical team to address health care and disease management related needs  Follow Up Plan:  Telephone follow up appointment with care management team member scheduled for:  Thursday 12/01/23 at 1030 am.  Follow up with provider re: PCP follow up post hospital discharge, labs, blood glucose monitoring system, paperwork for [facility] signed.  Referral for thyroid nodule noted on hospital admission per patient.  Next PCP appointment scheduled for:  11/28/23 at 1030 am.         11/24/23: [facility] RN CM completed initial post discharge outreach call with patient who verbally agreed to weekly calls in the 30 day program and permission to share information for referral to care guides and BSW for SDOH needs identified. Patient works for [employer] and has [employer] to take [facility] paperwork to PCP appointment on 11/28/23 as she is unable to walk the distance need to get to bus stops. Has been taking Lyft to work and returns to work next Tuesday.  Ambulatory Referrals made to care guides and SW for additional help with SDOH needs, community resources, transportation. Discussed additional benefits potential via commercial [employer] and was given number to contact. Patient to inquire about continuous blood glucose monitoring for Diabetes at PCP visit, referral for CPAP, along with a patient stated need for referral for thyroid nodule noted as inpatient this past admission.    Gabriel Cirri MSN, RN RN Case Sales executive Health  VBCI-Population Health Office Hours Wed/Thur  8:00 am-6:00 pm Direct Dial: [phone] Main Phone [phone]  Fax: [phone] [email or web address]

## 2023-11-24 NOTE — Patient Outreach (Signed)
 Care Management  Transitions of Care Program Transitions of Care Post-discharge Week #1   11/24/2023 Name: Amanda Garner MRN: [id number] DOB: 1971/01/09  Subjective: Amanda Garner is a 53 y.o. year old female who is a primary care patient of Hoy Register, MD. The Care Management team Engaged with patient Engaged with patient by telephone to assess and address transitions of care needs.   Consent to Services:  Patient was given information about care management services, agreed to services, and gave verbal consent to participate.   Assessment:  Date of Discharge: 11/23/23 Discharge Facility: [facility]) Type of Discharge: Inpatient Admission   Interventions Today    Flowsheet Row Most Recent Value  Chronic Disease   Chronic disease during today's visit Diabetes, Chronic Obstructive Pulmonary Disease (COPD)  General Interventions   General Interventions Discussed/Reviewed Annual Eye Exam, Doctor Visits, General Interventions Discussed  Doctor Visits Discussed/Reviewed Doctor Visits Discussed, Doctor Visits Reviewed, PCP  PCP/Specialist Visits Compliance with follow-up visit  Exercise Interventions   Exercise Discussed/Reviewed Physical Activity  Physical Activity Discussed/Reviewed Physical Activity Discussed  Education Interventions   Education Provided Provided Education  Provided Verbal Education On Blood Sugar Monitoring, Eye Care, Medication, When to see the doctor, [employer], Insurance Plans  Nutrition Interventions   Nutrition Discussed/Reviewed Nutrition Discussed, Decreasing sugar intake, Decreasing fats  Pharmacy Interventions   Pharmacy Dicussed/Reviewed Pharmacy Topics Discussed, Medications and their functions  Safety Interventions   Safety Discussed/Reviewed Home Safety  Advanced Directive Interventions   Advanced Directives Discussed/Reviewed --  [Declined at this time.]       SDOH Interventions    Flowsheet Row Telephone from 11/24/2023 in  [place] POPULATION HEALTH DEPARTMENT  SDOH Interventions   Food Insecurity Interventions AMB Referral, Other (Comment)  [Gave patient phone number call insurance for andy additional benefits.]  Housing Interventions Intervention Not Indicated  [11/23/23: Post discharge follow up call: Patient just moved into a new location as last rental had mold making her conditon worse. She says she feels stable for housing for the next year.]  Transportation Interventions AMB Referral, [employer] Provided, Patient Resources (Friends/Family), Payor Benefit, Other (Comment)  [Patient stated she is taking paperwork to PCP visit to [facility] as she is unable to take buses to due COPD/SOB on exertion and the distance between home and bus station. Discussed ambulatory referral to [facility] Care Guides and verbal  permission granted.]  Utilities Interventions Intervention Not Indicated        Goals Addressed               This Visit's Progress     Transitions of care (pt-stated)        Current Barriers:  Knowledge Deficits related to plan of care for management of COPD  Care Coordination needs related to Transportation and Limited access to food Chronic Disease Management support and education needs related to COPD  Transportation barriers  RNCM Clinical Goal(s):  Patient will work with the Care Management team over the next 30 days to address Transition of Care Barriers: Transportation take all medications exactly as prescribed and will call provider for medication related questions as evidenced by adherence and compliance with prescribed medication schedule.   attend all scheduled medical appointments: with PCP as evidenced by not missing any scheduled appointments and no 30 day readmissions to hospital  work with Child psychotherapist to address  related to the Financial planner, Limited access to food, Lacks knowledge of community resource: related to food and transportation, and insurance  benefits  related to the management of COPD and DMII as evidenced by review of EMR and patient or social worker report through collaboration with Medical illustrator, provider, and care team.   Interventions: Evaluation of current treatment plan related to  self management and patient's adherence to plan as established by provider   COPD Interventions:  (Status:  New goal.) Short Term Goal Provided instruction about proper use of medications used for management of COPD including inhalers Discussed the importance of adequate rest and management of fatigue with COPD Referral made to community resources care guide team for assistance with Transportation to medical appointments, [employer] resources. Assessed social determinant of health barriers   Diabetes Interventions:  (Status:  New goal.) Short Term Goal Assessed patient's understanding of A1c goal:  Patient to request PCP to obtain updated A1c value or check blood sugar as a steroids Last reported was 5.5, see below.  Reviewed medications with patient and discussed importance of medication adherence Counseled on importance of regular laboratory monitoring as prescribed Reviewed scheduled/upcoming provider appointments including: PCP appointment scheduled 11/28/23 and transportation issues. Can take Lyft as last resort but costly Referral made to social work team for assistance with SDOH needs, community resources, benefit resources.  Referral made to community resources care guide team for assistance with Transportation, community resources, food resources.  Assessed social determinant of health barriers Patient will inquire at PCP appointment on 11/28/23 about continuous blood glucose monitoring system/kit as patient has not been checking blood sugars and is out of equipment. Lab Results  Component Value Date   HGBA1C 5.5 11/21/2023    Patient Goals/Self-Care Activities: Participate in Transition of Care Program/Attend [facility] scheduled  calls Notify RN Care Manager of [facility] call rescheduling needs Take all medications as prescribed Attend all scheduled provider appointments Call pharmacy for medication refills 3-7 days in advance of running out of medications Call provider office for new concerns or questions  Work with the social worker to address care coordination needs and will continue to work with the clinical team to address health care and disease management related needs  Follow Up Plan:  Telephone follow up appointment with care management team member scheduled for:  Thursday 12/01/23 at 1030 am.  Follow up with provider re: PCP follow up post hospital discharge, labs, blood glucose monitoring system, paperwork for [facility] signed. Referral for thyroid nodule noted on hospital admission per patient.  Next PCP appointment scheduled for:  11/28/23 at 1030 am.         Plan: Telephone follow up appointment with care management team member scheduled for: 12/01/23 at 1030 am.  Follow up with provider re: See above follow up plan.    Gabriel Cirri MSN, RN RN Case Sales executive Health  VBCI-Population Health Office Hours Wed/Thur  8:00 am-6:00 pm Direct Dial: [phone] Main Phone [phone]  Fax: [phone] [email or web address]

## 2023-11-25 ENCOUNTER — Telehealth: Payer: Self-pay

## 2023-11-25 NOTE — Progress Notes (Signed)
 Complex Care Management Note Care Guide Note  11/25/2023 Name: Amanda Garner MRN: [id number] DOB: Oct 27, 1970  Amanda Garner is a 53 y.o. year old female who is a primary care patient of Hoy Register, MD . The community resource team was consulted for assistance with Transportation Needs , Food Insecurity, and utilities.  SDOH screenings and interventions completed:  Yes  Social Drivers of Health From This Encounter   Food Insecurity: Food Insecurity Present (11/25/2023)   Hunger Vital Sign    Worried About Running Out of Food in the Last Year: Sometimes true    Ran Out of Food in the Last Year: Sometimes true  Transportation Needs: Unmet Transportation Needs (11/25/2023)   PRAPARE - Administrator, Civil Service (Medical): Yes    Lack of Transportation (Non-Medical): Yes  Utilities: Not At Risk (11/25/2023)   Utilities    Threatened with loss of utilities: No    SDOH Interventions Today    Flowsheet Row Most Recent Value  SDOH Interventions   Food Insecurity Interventions Other (Comment)  [employer]. Patient was denied food stamps due to income.]  Transportation Interventions Other (Comment)  [Patientcompleted her [facility] application and will take it to her PCP Monday 4/7 to be signed. She has transportation for this appointment.I advised patient that she can contact her PCP office for emergency request if they have funds to cover the ride.]        Care guide performed the following interventions: Spoke with patient. She  has completed her [facility] application and will take it to her PCP Monday 4/7 to be signed. She has transportation for this appointment but may need transportation for upcoming appointments.  Patient is aware it could take up to 21 days for her application to be processed. I advised patient that she can contact her PCP office for emergency request if they have funds to cover the ride. Emailed [employer]. Also included information for the [place] Tax inspector for possible assistance with utilities.  Follow Up Plan:  No further follow up planned at this time. The patient has been provided with needed resources.  Encounter Outcome:  Patient Visit Completed  [name]  [facility] Guide Direct Dial: [phone]  Fax: [phone] Website: [email or web address]

## 2023-11-28 ENCOUNTER — Ambulatory Visit: Payer: 59 | Attending: Family Medicine | Admitting: Family Medicine

## 2023-11-28 ENCOUNTER — Telehealth: Payer: Self-pay | Admitting: Family Medicine

## 2023-11-28 ENCOUNTER — Encounter: Payer: Self-pay | Admitting: Family Medicine

## 2023-11-28 ENCOUNTER — Other Ambulatory Visit: Payer: Self-pay | Admitting: Pharmacist

## 2023-11-28 VITALS — BP 132/87 | HR 82 | Ht 66.0 in | Wt 198.8 lb

## 2023-11-28 DIAGNOSIS — J209 Acute bronchitis, unspecified: Secondary | ICD-10-CM

## 2023-11-28 DIAGNOSIS — R296 Repeated falls: Secondary | ICD-10-CM

## 2023-11-28 DIAGNOSIS — R0683 Snoring: Secondary | ICD-10-CM

## 2023-11-28 DIAGNOSIS — E1169 Type 2 diabetes mellitus with other specified complication: Secondary | ICD-10-CM

## 2023-11-28 DIAGNOSIS — Z7985 Long-term (current) use of injectable non-insulin antidiabetic drugs: Secondary | ICD-10-CM

## 2023-11-28 DIAGNOSIS — E041 Nontoxic single thyroid nodule: Secondary | ICD-10-CM | POA: Diagnosis not present

## 2023-11-28 DIAGNOSIS — F17210 Nicotine dependence, cigarettes, uncomplicated: Secondary | ICD-10-CM | POA: Diagnosis not present

## 2023-11-28 DIAGNOSIS — I10 Essential (primary) hypertension: Secondary | ICD-10-CM

## 2023-11-28 DIAGNOSIS — Z1231 Encounter for screening mammogram for malignant neoplasm of breast: Secondary | ICD-10-CM

## 2023-11-28 DIAGNOSIS — K219 Gastro-esophageal reflux disease without esophagitis: Secondary | ICD-10-CM

## 2023-11-28 DIAGNOSIS — E785 Hyperlipidemia, unspecified: Secondary | ICD-10-CM

## 2023-11-28 DIAGNOSIS — E119 Type 2 diabetes mellitus without complications: Secondary | ICD-10-CM

## 2023-11-28 DIAGNOSIS — J219 Acute bronchiolitis, unspecified: Secondary | ICD-10-CM

## 2023-11-28 LAB — GLUCOSE, POCT (MANUAL RESULT ENTRY): POC Glucose: 139 mg/dL — AB (ref 70–99)

## 2023-11-28 MED ORDER — TIRZEPATIDE 15 MG/0.5ML ~~LOC~~ SOAJ: 15.0000 mg | SUBCUTANEOUS | 6 refills | Status: AC

## 2023-11-28 MED ORDER — CARVEDILOL 3.125 MG PO TABS: 3.1250 mg | ORAL_TABLET | Freq: Two times a day (BID) | ORAL | 1 refills | Status: AC

## 2023-11-28 MED ORDER — ROSUVASTATIN CALCIUM 5 MG PO TABS: 5.0000 mg | ORAL_TABLET | Freq: Every day | ORAL | 1 refills | Status: AC

## 2023-11-28 MED ORDER — FLUTICASONE-SALMETEROL 250-50 MCG/ACT IN AEPB: 1.0000 | INHALATION_SPRAY | Freq: Two times a day (BID) | RESPIRATORY_TRACT | 2 refills | Status: AC

## 2023-11-28 MED ORDER — ALBUTEROL SULFATE (2.5 MG/3ML) 0.083% IN NEBU: 2.5000 mg | INHALATION_SOLUTION | Freq: Four times a day (QID) | RESPIRATORY_TRACT | 1 refills | Status: AC | PRN

## 2023-11-28 MED ORDER — MISC. DEVICES MISC: 0 refills | Status: AC

## 2023-11-28 MED ORDER — FREESTYLE LIBRE 3 READER DEVI: 0 refills | Status: AC

## 2023-11-28 MED ORDER — OMEPRAZOLE 20 MG PO CPDR: 20.0000 mg | DELAYED_RELEASE_CAPSULE | Freq: Every day | ORAL | 1 refills | Status: DC

## 2023-11-28 MED ORDER — FREESTYLE LIBRE 3 PLUS SENSOR MISC: 6 refills | Status: AC

## 2023-11-28 MED ORDER — AMLODIPINE BESYLATE 10 MG PO TABS: 10.0000 mg | ORAL_TABLET | Freq: Every day | ORAL | 1 refills | Status: AC

## 2023-11-28 MED ORDER — NICOTINE 7 MG/24HR TD PT24: 7.0000 mg | MEDICATED_PATCH | Freq: Every day | TRANSDERMAL | 3 refills | Status: AC

## 2023-11-28 NOTE — Progress Notes (Signed)
 Subjective:  Patient ID: Phylis Bougie, female    DOB: 06/07/1971  Age: 53 y.o. MRN: [id number]  CC: Medical Management of Chronic Issues [facility] visit/Discuss weight gain)     Discussed the use of AI scribe software for clinical note transcription with the patient, who gave verbal consent to proceed.  History of Present Illness Miss Samiah, a patient with a history of hypertension, GERD, Crohn's disease, diverticulitis (status post lower resection of colovaginal fistula in 10/2021 status post laparoscopic-assisted sigmoidectomy on 11/2021), type 2 diabetes mellitus, Nicotine dependence (since she was 20) was recently hospitalized for acute bronchitis. She presented with shortness of breath and was treated with nebulizers, antibiotics, and prednisone. A CTA chest performed during her hospital stay did not reveal any pulmonary embolism but identified a thyroid nodule measuring 2.7 cm.  Since her hospitalization, she reports improved breathing still complains of shortness of breath.  She was prescribed an ICS/LABA which she never picked up from the pharmacy due to high co-pay but has been using her Ventolin.  She has completed her course of Augmentin and prednisone.  She has been experiencing falls, which she suspects may be related to her medication. She reports one incident where she fell in her closet .  She was witnessed by her husband who had told her that she looked sleepy prior to falling.  She denies lightheadedness or her knees giving out.  She also has a history of a torn rotator cuff from a previous fall.  She also reports gaining weight even though she has been on Mounjaro for her diabetes.  Her hypertension is controlled on her current regimen.  She has been experiencing acid reflux symptoms.    Past Medical History:  Diagnosis Date   Acid reflux    Blood transfusion    Diverticulitis    Hypertension    Sickle cell trait [facility])     Past Surgical History:   Procedure Laterality Date   ABDOMINAL HYSTERECTOMY  2001   partial hysterectomy - emergency during last C-section   CESAREAN SECTION  1991; 1997; 2001   CESAREAN SECTION     FLEXIBLE SIGMOIDOSCOPY N/A 11/04/2021   Procedure: FLEXIBLE SIGMOIDOSCOPY;  Surgeon: Andria Meuse, MD;  Location: [facility];  Service: General;  Laterality: N/A;   XI ROBOTIC ASSISTED LOWER ANTERIOR RESECTION N/A 11/04/2021   Procedure: XI ROBOTIC ASSISTED LOWER ANTERIOR RESECTION, RESECTION OF COLOVAGINAL FISTULA, BILATERAL TAP BLOCK, INJECTION OF FIREFLY;  Surgeon: Andria Meuse, MD;  Location: [facility];  Service: General;  Laterality: N/A;    Family History  Problem Relation Age of Onset   Hypertension Mother    Diabetes Mother    Diabetes type II Mother    Diabetes type II Father    Stroke Father    Lung cancer Father 70   Esophageal cancer Maternal Grandfather    Colon cancer Neg Hx    Pancreatic cancer Neg Hx    Stomach cancer Neg Hx     Social History   Socioeconomic History   Marital status: Married    Spouse name: Not on file   Number of children: 3   Years of education: Not on file   Highest education level: Not on file  Occupational History   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, nature: [employer]    Comment: Company secretary  Tobacco Use   Smoking status: Some Days    Current packs/day: 0.50    Average packs/day: 0.5 packs/day for 31.0  years (15.5 ttl pk-yrs)    Types: Cigarettes   Smokeless tobacco: Never  Vaping Use   Vaping status: Never Used  Substance and Sexual Activity   Alcohol use: Yes    Alcohol/week: 1.0 standard drink of alcohol    Types: 1 Standard drinks or equivalent per week    Comment: occassionally   Drug use: Yes    Frequency: 7.0 times per week    Types: Marijuana   Sexual activity: Yes    Partners: Male    Birth control/protection: Surgical  Other Topics Concern   Not on file  Social History Narrative   ** Merged  History Encounter **       Social Drivers of Health   Financial Resource Strain: High Risk (11/28/2023)   Overall Financial Resource Strain (CARDIA)    Difficulty of Paying Living Expenses: Hard  Food Insecurity: Food Insecurity Present (11/25/2023)   Hunger Vital Sign    Worried About Running Out of Food in the Last Year: Sometimes true    Ran Out of Food in the Last Year: Sometimes true  Transportation Needs: Unmet Transportation Needs (11/25/2023)   PRAPARE - Transportation    Lack of Transportation (Medical): Yes    Lack of Transportation (Non-Medical): Yes  Physical Activity: Not on file  Stress: Stress Concern Present (11/28/2023)   [employer] of Occupational Health - Occupational Stress Questionnaire    Feeling of Stress : To some extent  Social Connections: Socially Integrated (11/28/2023)   Social Connection and Isolation Panel [NHANES]    Frequency of Communication with Friends and Family: More than three times a week    Frequency of Social Gatherings with Friends and Family: More than three times a week    Attends Religious Services: 1 to 4 times per year    Active Member of [employer] or Organizations: Yes    Attends Banker Meetings: 1 to 4 times per year    Marital Status: Married    No Known Allergies  Outpatient Medications Prior to Visit  Medication Sig Dispense Refill   Accu-Chek FastClix Lancets MISC Use as directed to check blood glucose once a day before breakfast. 100 each 1   albuterol (VENTOLIN HFA) 108 (90 Base) MCG/ACT inhaler INHALE 1-2 PUFFS BY MOUTH EVERY 6 HOURS AS NEEDED FOR WHEEZE OR SHORTNESS OF BREATH 8.5 each 1   amLODipine (NORVASC) 10 MG tablet Take 1 tablet (10 mg total) by mouth daily. 90 tablet 1   Blood Glucose Monitoring Suppl (BLOOD GLUCOSE MONITOR SYSTEM) w/Device KIT Use as directed to check blood glucose once a day before breakfast. 1 kit 0   buPROPion (WELLBUTRIN XL) 150 MG 24 hr tablet Take 150 mg by mouth daily.      carvedilol (COREG) 3.125 MG tablet Take 1 tablet (3.125 mg total) by mouth 2 (two) times daily. 60 tablet 0   fluticasone-salmeterol (WIXELA INHUB) 250-50 MCG/ACT AEPB Inhale 1 puff into the lungs in the morning and at bedtime. 60 each 0   gabapentin (NEURONTIN) 300 MG capsule Take 1 capsule (300 mg total) by mouth at bedtime. 30 capsule 3   glucose blood test strip Use to test blood glucose daily before breakfast. 100 each 0   naproxen (NAPROSYN) 500 MG tablet TAKE 1 TABLET(500 MG) BY MOUTH TWICE DAILY WITH A MEAL (Patient taking differently: Take 500 mg by mouth daily as needed for moderate pain (pain score 4-6).) 60 tablet 0   nicotine (NICODERM CQ) 7 mg/24hr patch Place 1 patch (  7 mg total) onto the skin daily. 28 patch 3   ondansetron (ZOFRAN-ODT) 4 MG disintegrating tablet Take 1 tablet (4 mg total) by mouth every 8 (eight) hours as needed for nausea or vomiting. 20 tablet 0   tirzepatide (MOUNJARO) 15 MG/0.5ML Pen Inject 15 mg into the skin once a week. 6 mL 6   traMADol (ULTRAM) 50 MG tablet Take 1 tablet (50 mg total) by mouth at bedtime as needed. 20 tablet 0   amoxicillin-clavulanate (AUGMENTIN) 875-125 MG tablet Take 1 tablet by mouth 2 (two) times daily for 5 days. (Patient not taking: Reported on 11/28/2023) 10 tablet 0   guaiFENesin (MUCINEX) 600 MG 12 hr tablet Take 1 tablet (600 mg total) by mouth 2 (two) times daily for 7 days. (Patient not taking: Reported on 11/28/2023) 14 tablet 0   predniSONE (DELTASONE) 10 MG tablet Take 4 tablets daily X 2 days, then, Take 3 tablets daily X 2 days, then, Take 2 tablets daily X 2 days, then, Take 1 tablets daily X 1 day. (Patient not taking: Reported on 11/28/2023) 19 tablet 0   No facility-administered medications prior to visit.     ROS Review of Systems  Constitutional:  Positive for unexpected weight change. Negative for activity change and appetite change.  HENT:  Negative for sinus pressure and sore throat.   Respiratory:  Negative for  chest tightness, shortness of breath and wheezing.   Cardiovascular:  Negative for chest pain and palpitations.  Gastrointestinal:  Negative for abdominal distention, abdominal pain and constipation.  Genitourinary: Negative.   Musculoskeletal: Negative.   Psychiatric/Behavioral:  Negative for behavioral problems and dysphoric mood.     Objective:  BP 132/87   Pulse 82   Ht 5\' 6"  (1.676 m)   Wt 198 lb 12.8 oz (90.2 kg)   LMP 08/20/2011   SpO2 98%   BMI 32.09 kg/m      11/28/2023   10:58 AM 11/23/2023    8:00 AM 11/23/2023    5:23 AM  BP/Weight  Systolic BP 132 136 129  Diastolic BP 87 98 92  Wt. (Lbs) 198.8    BMI 32.09 kg/m2      Wt Readings from Last 3 Encounters:  11/28/23 198 lb 12.8 oz (90.2 kg)  11/21/23 189 lb 9.5 oz (86 kg)  10/04/23 190 lb (86.2 kg)      Physical Exam Constitutional:      Appearance: She is well-developed.  Cardiovascular:     Rate and Rhythm: Normal rate.     Heart sounds: Normal heart sounds. No murmur heard. Pulmonary:     Effort: Pulmonary effort is normal.     Breath sounds: Normal breath sounds. No wheezing or rales.  Chest:     Chest wall: No tenderness.  Abdominal:     General: Bowel sounds are normal. There is no distension.     Palpations: Abdomen is soft. There is no mass.     Tenderness: There is no abdominal tenderness.  Musculoskeletal:        General: Normal range of motion.     Right lower leg: No edema.     Left lower leg: No edema.  Neurological:     Mental Status: She is alert and oriented to person, place, and time.  Psychiatric:        Mood and Affect: Mood normal.        Latest Ref Rng & Units 11/23/2023    6:20 AM 11/22/2023    6:53 AM  11/21/2023    3:32 PM  CMP  Glucose 70 - 99 mg/dL 161  096    BUN 6 - 20 mg/dL 11  11    Creatinine 0.45 - 1.00 mg/dL 4.09  8.11    Sodium 914 - 145 mmol/L 140  141    Potassium 3.5 - 5.1 mmol/L 4.2  3.7    Chloride 98 - 111 mmol/L 113  114    CO2 22 - 32 mmol/L 18  19     Calcium 8.9 - 10.3 mg/dL 8.8  9.1    Total Protein 6.5 - 8.1 g/dL  6.4  7.0   Total Bilirubin 0.0 - 1.2 mg/dL  0.3  0.5   Alkaline Phos 38 - 126 U/L  67  80   AST 15 - 41 U/L  18  23   ALT 0 - 44 U/L  18  24     Lipid Panel     Component Value Date/Time   CHOL 136 11/05/2021 0424   CHOL 144 02/11/2020 0915   TRIG 168 (H) 11/05/2021 0424   HDL 31 (L) 11/05/2021 0424   HDL 40 02/11/2020 0915   CHOLHDL 4.4 11/05/2021 0424   VLDL 34 11/05/2021 0424   LDLCALC 71 11/05/2021 0424   LDLCALC 67 02/11/2020 0915    CBC    Component Value Date/Time   WBC 20.5 (H) 11/23/2023 0620   RBC 5.55 (H) 11/23/2023 0620   HGB 12.4 11/23/2023 0620   HGB 13.0 01/31/2020 0953   HCT 38.0 11/23/2023 0620   HCT 43.0 01/31/2020 0953   PLT 233 11/23/2023 0620   PLT 318 01/31/2020 0953   MCV 68.5 (L) 11/23/2023 0620   MCV 71 (L) 01/31/2020 0953   MCH 22.3 (L) 11/23/2023 0620   MCHC 32.6 11/23/2023 0620   RDW 15.3 11/23/2023 0620   RDW 17.0 (H) 01/31/2020 0953   LYMPHSABS 1.9 11/23/2023 0620   LYMPHSABS 2.8 01/31/2020 0953   MONOABS 0.7 11/23/2023 0620   EOSABS 0.0 11/23/2023 0620   EOSABS 0.3 01/31/2020 0953   BASOSABS 0.0 11/23/2023 0620   BASOSABS 0.0 01/31/2020 0953    Lab Results  Component Value Date   HGBA1C 5.5 11/21/2023       Assessment & Plan Acute Bronchitis Condition improved post-hospitalization with current medication regimen. - Order nebulizer device. - Prescribe albuterol solution for nebulizer. -COPD spectated given smoking history Smoking and shortness of breath suggestive of COPD. Lung function test needed for confirmation and management. - Order lung function test to evaluate for COPD at next visit.  Tobacco Use Disorder Bupropion ineffective for smoking cessation. Discussed nicotine patches as an alternative. - Prescribe nicotine patches.  Thyroid Nodule 2.7 cm thyroid nodule requires further evaluation. Ultrasound needed to guide management. - Order  thyroid ultrasound.  Snoring Reported apnea episodes and symptoms suggest sleep apnea. Sleep study required for diagnosis and CPAP therapy assessment. - Order sleep study (split night).  Type II Diabetes Mellitus Elevated blood sugar likely due to prednisone. Discussed continuous glucose monitoring options and emphasized monitoring importance. - Send message to pharmacist to check insurance coverage for [place] or [employer]. - Provide printout with instructions for managing low and high blood sugar per request  Hypertension Blood pressure management ongoing with amlodipine and carvedilol. -Counseled on blood pressure goal of less than 130/80, low-sodium, DASH diet, medication compliance, 150 minutes of moderate intensity exercise per week. Discussed medication compliance, adverse effects.   Hyperlipidemia Not on statins due to muscle cramps. Plan  to trial low dose Crestor with adjusted dosing if needed. - Prescribe low dose Crestor.  Acid Reflux Advised dietary changes. Omeprazole prescribed for symptom management. - Prescribe low dose omeprazole daily.  General Health Maintenance Routine screenings needed. Recent eye exam showed diabetes-related changes. - Order mammogram. - Order cholesterol test.      No orders of the defined types were placed in this encounter.   Follow-up: No follow-ups on file.       Hoy Register, MD, FAAFP. [facility] [place], [place] [phone]   11/28/2023, 11:06 AM

## 2023-11-28 NOTE — Telephone Encounter (Signed)
 I am not sure if a CGM is covered since she is just on Mounjaro.  Can you please look into this and send the prescription if it is?  Thank you.

## 2023-11-28 NOTE — Patient Instructions (Addendum)
 Blood sugar is elevated at 139  but could be due to recent prednisone use. No changes today and we will send a meter for her to the pharmacy and if blood sugars remain above goal after 1 week she needs to notify me.      Blood Glucose Monitoring, Adult To manage your diabetes, you'll need to keep track of your blood sugar. This is called blood glucose monitoring. Check your blood glucose as often as told. Keep a journal of your results over time. This can help you: Know when to adjust your diabetes management plan with your health care provider. See how food, exercise, illness, and medicines affect your blood glucose. Know what your blood glucose is at any time. Your provider will set specific goals for your blood glucose levels. In many cases, these goals may be: Before meals: 80-130 mg/dL (1.6-1.0 mmol/L). After meals: below 180 mg/dL (10 mmol/L). A1C level: less than 7%. Supplies needed: Blood glucose meter. Test strips for your meter. Each brand of meter has its own strips. You must use the strips that came with your meter. A lancet. This is a sharp device used to poke your finger. Do not use a lancet more than once. A journal or logbook to write down your results. How to check your blood glucose Checking your blood glucose  Wash your hands with soap and water for at least 20 seconds. Use the lancet to poke the side of your finger. Do not poke the tip of your finger. Also, try not to use the same finger each time. Gently squeeze the finger until a small drop of blood appears. Follow the meter instructions on how to insert the test strip, apply blood to the strip, and use the meter. Write down your result and any notes. Using different sites Some blood glucose meters allow testing on other parts of your body to test your blood. The most common places are the forearm, thigh, upper arm, and palm of the hand. Check your meter's instructions. Using different sites may not be as accurate  as your fingers. If you think you have low blood glucose, only use your finger. General tips Blood glucose log  Write down the result each time you check your blood glucose. Note anything that may be affecting your blood glucose. This can help you and your provider: Look for patterns over time. Adjust your management plan as needed. Check if your meter has an app or lets you download your records to a computer. Most meters keep a record of glucose readings in the meter. If you have type 1 diabetes: Check your blood glucose as often as told. This may be: Before each meal and snack. Two hours after a meal. Before bedtime. If you have symptoms of hypoglycemia. After treating your hypoglycemia. Before doing things that have a risk of injury, such as driving or using machinery. Before and after exercise. Between 2:00 a.m. and 3:00 a.m. You may need to check your blood glucose more often, such as up to 6-10 times a day, if: You have diabetes that is not well controlled. You are ill. You have a history of severe hypoglycemia. You have hypoglycemia unawareness. If you have type 2 diabetes: You may need to check your blood glucose 2 or more times a day. Check your blood glucose as often as told by your provider. This may include: Before and after exercise. Before doing things that have a risk of injury, such as driving or using machinery. You may  need to check your blood glucose more often if: Your medicine is being adjusted. Your diabetes is not well controlled. You are ill. General tips Always have your blood glucose meter and supplies with you. After you use a few boxes of test strips, adjust your blood glucose meter as needed. Follow the meter instructions. If you have questions or need help, all blood glucose meters have a 24-hour hotline phone number that you can call. Also, contact your provider with any questions or concerns. Where to find more information The American Diabetes  Association: diabetes.org The Association of Diabetes Care & Education Specialists: diabeteseducator.org Contact a health care provider if: Your blood glucose is at or above 240 mg/dL (96.0 mmol/L) for 2 days in a row. You have been sick or have had a fever for 2 days or longer and are not getting better. You have any of these problems for more than 6 hours: You cannot eat or drink. You have nausea or vomiting. You have diarrhea. Get help right away if: Your blood glucose is lower than 54 mg/dL (3 mmol/L). You become confused, or you have trouble thinking clearly. You have trouble breathing. You have moderate to high ketone levels in your pee. These symptoms may be an emergency. Get help right away. Call 911. Do not wait to see if the symptoms will go away. Do not drive yourself to the hospital. This information is not intended to replace advice given to you by your health care provider. Make sure you discuss any questions you have with your health care provider. Document Revised: 03/22/2023 Document Reviewed: 06/25/2022 Elsevier Patient Education  2024 [employer].

## 2023-11-29 ENCOUNTER — Encounter: Payer: Self-pay | Admitting: Family Medicine

## 2023-11-29 LAB — LP+NON-HDL CHOLESTEROL
Cholesterol, Total: 214 mg/dL — ABNORMAL HIGH (ref 100–199)
HDL: 58 mg/dL (ref >39)
LDL Chol Calc (NIH): 124 mg/dL — ABNORMAL HIGH (ref 0–99)
Total Non-HDL-Chol (LDL+VLDL): 156 mg/dL — ABNORMAL HIGH (ref 0–129)
Triglycerides: 183 mg/dL — ABNORMAL HIGH (ref 0–149)
VLDL Cholesterol Cal: 32 mg/dL (ref 5–40)

## 2023-12-01 ENCOUNTER — Other Ambulatory Visit: Payer: Self-pay

## 2023-12-01 NOTE — Transitions of Care (Post Inpatient/ED Visit) (Signed)
 Care Management  Transitions of Care Program Transitions of Care Post-discharge week 2  12/01/2023 Name: Amanda Garner MRN: [id number] DOB: 1971/07/10  Subjective: Amanda Garner is a 52 y.o. year old female who is a primary care patient of Hoy Register, MD. The Care Management team  attempted to reach patient but was at work and unable to speak  to assess and address transitions of care needs.   Note:  Completed PCP follow up on 11/28/23.  Ambulatory BSW referral for SDOH needs was completed on 11/25/23.   Plan: Additional outreach attempts will be made to reach the patient enrolled in the [facility] Program (Post Inpatient/ED Visit).   Gabriel Cirri MSN, RN RN Case Sales executive Health  VBCI-Population Health Office Hours Wed/Thur  8:00 am-6:00 pm Direct Dial: [phone] Main Phone [phone]  Fax: [phone] [email or web address]

## 2023-12-01 NOTE — Transitions of Care (Post Inpatient/ED Visit) (Deleted)
   12/01/2023  Name: Amanda Garner MRN: [id number] DOB: February 15, 1971  {[id number]}

## 2023-12-05 ENCOUNTER — Other Ambulatory Visit: Payer: Self-pay

## 2023-12-05 ENCOUNTER — Ambulatory Visit
Admission: RE | Admit: 2023-12-05 | Discharge: 2023-12-05 | Disposition: A | Discharge status: Home / Self Care | Source: Ambulatory Visit | Attending: Family Medicine | Admitting: Family Medicine

## 2023-12-05 DIAGNOSIS — E041 Nontoxic single thyroid nodule: Secondary | ICD-10-CM

## 2023-12-05 DIAGNOSIS — Z1231 Encounter for screening mammogram for malignant neoplasm of breast: Secondary | ICD-10-CM

## 2023-12-07 ENCOUNTER — Other Ambulatory Visit: Payer: Self-pay

## 2023-12-07 NOTE — Transitions of Care (Post Inpatient/ED Visit) (Signed)
 Care Management  Transitions of Care Program Transitions of Care Post-discharge week 3  12/07/2023 Name: Amanda Garner MRN: [id number] DOB: Jan 12, 1971  Subjective: Amanda Garner is a 53 y.o. year old female who is a primary care patient of Newlin, Enobong, MD. The Care Management team was unable to reach the patient by phone to assess and address transitions of care needs.   Plan: Additional outreach attempts will be made to reach the patient enrolled in the [facility] Program (Post Inpatient/ED Visit).  Note: Patient works till 4-5 pm now.    Katheryn Pandy MSN, RN RN Case Sales executive Health  VBCI-Population Health Office Hours Wed/Thur  8:00 am-6:00 pm Direct Dial: [phone] Main Phone [phone]  Fax: [phone] [email or web address]

## 2023-12-08 ENCOUNTER — Encounter: Payer: Self-pay | Admitting: Family Medicine

## 2023-12-08 ENCOUNTER — Other Ambulatory Visit: Payer: Self-pay

## 2023-12-08 NOTE — Transitions of Care (Post Inpatient/ED Visit) (Signed)
 Care Management  Transitions of Care Program Transitions of Care Post-discharge week 3  12/08/2023 Name: Amanda Garner MRN: [id number] DOB: 10/14/1970  Subjective: Amanda Garner is a 53 y.o. year old female who is a primary care patient of Newlin, Enobong, MD. The Care Management team  to assess and address transitions of care needs.   Plan: No further outreach attempts will be made at this time.  We have been unable to reach the patient.   Patient is at work during the day till 6 pm and unable to maintain contact. Spoke with patient very briefly  at her workplace, and patient stated she had been to her appointments and had heard from [facility] referral for SDOH needs and did not feel she needed further follow up due to working hours.   Closing TOC program.    Katheryn Pandy MSN, RN RN Case Manager [place]  VBCI-Population Health Office Hours Wed/Thur  8:00 am-6:00 pm Direct Dial: [phone] Main Phone [phone]  Fax: [phone] [email or web address]

## 2023-12-09 ENCOUNTER — Encounter: Payer: Self-pay | Admitting: Family Medicine

## 2023-12-12 ENCOUNTER — Other Ambulatory Visit: Payer: Self-pay | Admitting: Family Medicine

## 2023-12-12 DIAGNOSIS — E041 Nontoxic single thyroid nodule: Secondary | ICD-10-CM

## 2023-12-28 ENCOUNTER — Other Ambulatory Visit: Payer: Self-pay | Admitting: Otolaryngology

## 2023-12-28 DIAGNOSIS — E041 Nontoxic single thyroid nodule: Secondary | ICD-10-CM

## 2024-01-10 ENCOUNTER — Other Ambulatory Visit: Payer: Self-pay | Admitting: Family Medicine

## 2024-01-13 ENCOUNTER — Other Ambulatory Visit: Payer: Self-pay | Admitting: Orthopaedic Surgery

## 2024-01-13 ENCOUNTER — Telehealth: Payer: Self-pay | Admitting: Orthopedic Surgery

## 2024-01-13 DIAGNOSIS — M791 Myalgia, unspecified site: Secondary | ICD-10-CM

## 2024-01-13 MED ORDER — GABAPENTIN 300 MG PO CAPS: 300.0000 mg | ORAL_CAPSULE | Freq: Every day | ORAL | 3 refills | Status: AC

## 2024-01-13 NOTE — Telephone Encounter (Signed)
 Patient is scheduled for right shoulder arthroscopy, debridement, mini open biceps & rotator cuff tear repair, manipulation under anesthesia on 02-06-24 at [facility] with Dr. Rozelle Corning.  She is requesting Gabapentin  300mg  be called in to her pharmacy at [facility] [address]  She is out completely and needs something ASAP to get her through until her surgery date.

## 2024-01-30 ENCOUNTER — Inpatient Hospital Stay: Admission: RE | Admit: 2024-01-30 | Source: Ambulatory Visit

## 2024-01-30 ENCOUNTER — Encounter (HOSPITAL_BASED_OUTPATIENT_CLINIC_OR_DEPARTMENT_OTHER): Payer: Self-pay | Admitting: Orthopedic Surgery

## 2024-01-31 ENCOUNTER — Telehealth: Payer: Self-pay | Admitting: Orthopedic Surgery

## 2024-01-31 NOTE — Telephone Encounter (Signed)
 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  Sometimes people stay out for 3 months, but that will be based on how she is progressing.  She will likely be doing PT in some from from 2 weeks postop to about 3-4 months postop.    Also since she is putting it off, would probably be good to get her appointment with Dr Rozelle Corning prior to surgery like first week of July so he can meet her and we can see what her ROM is

## 2024-01-31 NOTE — Telephone Encounter (Signed)
 Called and advised, scheduled preop appointment as requested by [facility]

## 2024-01-31 NOTE — Telephone Encounter (Signed)
 Patient rescheduled her right shoulder surgery from 02-06-24 at [facility] to 03-31-24 due to a death in the family.  Patient would like to get an idea of the down time and how long she will be out of work. Please call her at [phone].

## 2024-02-01 ENCOUNTER — Other Ambulatory Visit: Payer: Self-pay | Admitting: Family Medicine

## 2024-02-06 DIAGNOSIS — Z01818 Encounter for other preprocedural examination: Secondary | ICD-10-CM

## 2024-02-08 ENCOUNTER — Telehealth: Payer: Self-pay

## 2024-02-08 DIAGNOSIS — R0683 Snoring: Secondary | ICD-10-CM

## 2024-02-08 NOTE — Telephone Encounter (Signed)
 Copied from CRM [id number]. Topic: General - Other >> Feb 08, 2024 10:35 AM DeAngela L wrote: Reason for CRM: Blaise Bumps calling from the Sleep study department called to inform Dr Adan Holms the patients insurance denied the request  Phone number [phone]

## 2024-02-09 NOTE — Telephone Encounter (Signed)
 FYI

## 2024-02-10 NOTE — Telephone Encounter (Signed)
 I have placed a new order for home sleep test.  Hopefully insurance will cover this.

## 2024-02-10 NOTE — Telephone Encounter (Signed)
 Noted

## 2024-02-10 NOTE — Addendum Note (Signed)
 Addended by: Winnie Umali on: 02/10/2024 10:15 AM   Modules accepted: Orders

## 2024-02-13 ENCOUNTER — Encounter: Admitting: Surgical

## 2024-02-29 ENCOUNTER — Encounter: Payer: Self-pay | Admitting: Orthopedic Surgery

## 2024-02-29 ENCOUNTER — Ambulatory Visit: Admitting: Orthopedic Surgery

## 2024-02-29 DIAGNOSIS — M75101 Unspecified rotator cuff tear or rupture of right shoulder, not specified as traumatic: Secondary | ICD-10-CM

## 2024-02-29 DIAGNOSIS — M7502 Adhesive capsulitis of left shoulder: Secondary | ICD-10-CM

## 2024-02-29 NOTE — Progress Notes (Signed)
 Office Visit Note   Patient: Amanda Garner           Date of Birth: 05/15/1971           MRN: [id number] Visit Date: 02/29/2024 Requested by: Delbert Clam, MD [address] [place],  [place] [address] PCP: Delbert Clam, MD  Subjective: Chief Complaint  Patient presents with   Right Shoulder - Pain    HPI: Mylasia Vorhees is a 53 y.o. female who presents to the office reporting right shoulder pain.  Patient scheduled for right shoulder arthroscopy biceps tenodesis rotator cuff tear repair 03/19/2024.  Little bit of pain radiating up into the neck at this time.  Taking gabapentin  and ibuprofen .  MRI of the cervical spine shows no right-sided pathology.  Patient does do a lot of typing..                ROS: All systems reviewed are negative as they relate to the chief complaint within the history of present illness.  Patient denies fevers or chills.  Assessment & Plan: Visit Diagnoses:  1. Tear of right supraspinatus tendon   2. Adhesive capsulitis of left shoulder     Plan: Impression is right shoulder pain.  A lot of questions about rotator cuff surgery are answered today.  Will be a difficult first 2 weeks after surgery.  Importance of passive range of motion discussed.  Risk and benefits discussed as well including not limited to infection nerve vessel damage incomplete pain weak as well as incomplete functional restoration.  Rehab goals discussed.  All questions answered.  We will try her on trazodone  to help her sleep.  Follow-Up Instructions: No follow-ups on file.   Orders:  No orders of the defined types were placed in this encounter.  No orders of the defined types were placed in this encounter.     Procedures: No procedures performed   Clinical Data: No additional findings.  Objective: Vital Signs: LMP 08/20/2011   Physical Exam:  Constitutional: Patient appears well-developed HEENT:  Head: Normocephalic Eyes:EOM are normal Neck: Normal  range of motion Cardiovascular: Normal rate Pulmonary/chest: Effort normal Neurologic: Patient is alert Skin: Skin is warm Psychiatric: Patient has normal mood and affect  Ortho Exam: Ortho exam demonstrates good cervical spine range of motion.  MRI of this right shoulder does demonstrate complete full-thickness full width tear of the supraspinatus tendon with about a centimeter half of retraction.  Biceps also shows severe tendinosis of the intra-articular portion of the biceps.  Patient has slight weakness to external rotation on the right compared to the left subscap strength is intact.  Positive bicipital groove tenderness on the right versus left.  No discrete AC joint tenderness right versus left.  Positive O'Brien's testing on the right negative on the left.  Shoulder range of motion otherwise symmetric passively.  Does have a fair amount of pain and apprehension with passive shoulder range of motion above 90 degrees of abduction.  Specialty Comments:  CLINICAL DATA:  Neck pain radiating into the right shoulder and right arm for 3 months. History of a fall.   EXAM: MRI CERVICAL SPINE WITHOUT CONTRAST   TECHNIQUE: Multiplanar, multisequence MR imaging of the cervical spine was performed. No intravenous contrast was administered.   COMPARISON:  None Available.   FINDINGS: Alignment: There is reversal of the normal cervical lordosis centered about the C5-6 level. No listhesis.   Vertebrae: No fracture, evidence of discitis, or bone lesion.   Cord: Normal  signal throughout.   Posterior Fossa, vertebral arteries, paraspinal tissues: Negative.   Disc levels:   C2-3: Negative.   C3-4: Shallow disc bulge.  No stenosis.   C4-5: A left paracentral disc protrusion contacts the ventral cord. Mild right foraminal narrowing is present. The left foramen is open.   C5-6: There is a disc osteophyte complex and some uncovertebral spurring. Mild flattening of the ventral cord due to  bursal lordosis and spondylosis is present. Mild left foraminal narrowing. The right foramen is open.   C6-7: Shallow disc bulge to the right without stenosis.   C7-T1: Negative.   IMPRESSION: 1. Left paracentral disc protrusion at C4-5 contacts the ventral cord. Mild right foraminal narrowing is present at this level. 2. Reversal of the normal cervical lordosis centered about C5-6 where there is a disc osteophyte complex and uncovertebral spurring. Mild flattening of the ventral cord and mild left foraminal narrowing are present at this level.     Electronically Signed   By: Debby Prader M.D.   On: 08/20/2023 11:39  Imaging: No results found.   PMFS History: Patient Active Problem List   Diagnosis Date Noted   COPD exacerbation (HCC) 11/21/2023   Prolonged QT interval 11/21/2023   Lactic acidosis 11/21/2023   Thyroid  nodule 11/21/2023   Type 2 diabetes mellitus (HCC) 11/05/2021   GERD (gastroesophageal reflux disease) 11/05/2021   S/P laparoscopic-assisted sigmoidectomy 11/04/2021   Abnormal CT scan 08/05/2021   Hx of diverticulitis of colon 08/05/2021   Colovaginal fistula 07/03/2021   Acquired palmar and plantar hyperkeratosis 01/25/2021   Hyperglycemia 02/01/2020   Crohn's disease of large intestine with abscess (HCC) 01/31/2020   Acute diverticulitis 01/09/2020   Diverticulitis of large intestine with abscess without bleeding 06/07/2017   Abscess of sigmoid colon due to diverticulitis 05/24/2017   Tobacco dependence 05/11/2017   Essential hypertension 05/11/2017   Reflux esophagitis 05/11/2017   Tinea pedis of left foot 04/11/2015   C. difficile colitis 08/26/2011   Enteritis 08/25/2011   Past Medical History:  Diagnosis Date   Acid reflux    Blood transfusion    Diabetes mellitus without complication (HCC)    Diverticulitis    Hypertension    Sickle cell trait (HCC)     Family History  Problem Relation Age of Onset   Hypertension Mother     Diabetes Mother    Diabetes type II Mother    Diabetes type II Father    Stroke Father    Lung cancer Father 87   Esophageal cancer Maternal Grandfather    Colon cancer Neg Hx    Pancreatic cancer Neg Hx    Stomach cancer Neg Hx     Past Surgical History:  Procedure Laterality Date   ABDOMINAL HYSTERECTOMY  2001   partial hysterectomy - emergency during last C-section   CESAREAN SECTION  1991; 1997; 2001   CESAREAN SECTION     FLEXIBLE SIGMOIDOSCOPY N/A 11/04/2021   Procedure: FLEXIBLE SIGMOIDOSCOPY;  Surgeon: Teresa Lonni HERO, MD;  Location: [facility];  Service: General;  Laterality: N/A;   XI ROBOTIC ASSISTED LOWER ANTERIOR RESECTION N/A 11/04/2021   Procedure: XI ROBOTIC ASSISTED LOWER ANTERIOR RESECTION, RESECTION OF COLOVAGINAL FISTULA, BILATERAL TAP BLOCK, INJECTION OF FIREFLY;  Surgeon: Teresa Lonni HERO, MD;  Location: [facility];  Service: General;  Laterality: N/A;   Social History   Occupational History   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, [employer]: [employer]    Comment: Company secretary  Tobacco Use  Smoking status: Some Days    Current packs/day: 0.50    Average packs/day: 0.5 packs/day for 31.0 years (15.5 ttl pk-yrs)    Types: Cigarettes   Smokeless tobacco: Never  Vaping Use   Vaping status: Never Used  Substance and Sexual Activity   Alcohol use: Yes    Alcohol/week: 1.0 standard drink of alcohol    Types: 1 Standard drinks or equivalent per week    Comment: occassionally   Drug use: Yes    Frequency: 7.0 times per week    Types: Marijuana   Sexual activity: Yes    Partners: Male    Birth control/protection: Surgical

## 2024-03-03 MED ORDER — TRAZODONE HCL 50 MG PO TABS: 50.0000 mg | ORAL_TABLET | Freq: Every day | ORAL | 0 refills | Status: AC

## 2024-03-12 ENCOUNTER — Other Ambulatory Visit: Payer: Self-pay

## 2024-03-12 ENCOUNTER — Encounter (HOSPITAL_BASED_OUTPATIENT_CLINIC_OR_DEPARTMENT_OTHER): Payer: Self-pay | Admitting: Orthopedic Surgery

## 2024-03-13 ENCOUNTER — Telehealth: Payer: Self-pay | Admitting: Orthopedic Surgery

## 2024-03-13 NOTE — Telephone Encounter (Signed)
 Patient called and said she is having surgery Monday and her job wants a letter stating when she is able to come back. CB#[phone] She wants it in her Mychart

## 2024-03-13 NOTE — Telephone Encounter (Signed)
 3 mos thx

## 2024-03-14 ENCOUNTER — Encounter (HOSPITAL_BASED_OUTPATIENT_CLINIC_OR_DEPARTMENT_OTHER)
Admission: RE | Admit: 2024-03-14 | Discharge: 2024-03-14 | Disposition: A | Discharge status: Home / Self Care | Source: Ambulatory Visit | Attending: Orthopedic Surgery | Admitting: Orthopedic Surgery

## 2024-03-14 ENCOUNTER — Encounter: Payer: Self-pay | Admitting: Radiology

## 2024-03-14 DIAGNOSIS — Z01812 Encounter for preprocedural laboratory examination: Secondary | ICD-10-CM | POA: Insufficient documentation

## 2024-03-14 DIAGNOSIS — Z01818 Encounter for other preprocedural examination: Secondary | ICD-10-CM | POA: Diagnosis present

## 2024-03-14 LAB — BASIC METABOLIC PANEL WITH GFR
Anion gap: 10 (ref 5–15)
BUN: 11 mg/dL (ref 6–20)
CO2: 24 mmol/L (ref 22–32)
Calcium: 9.4 mg/dL (ref 8.9–10.3)
Chloride: 107 mmol/L (ref 98–111)
Creatinine, Ser: 0.7 mg/dL (ref 0.44–1.00)
GFR, Estimated: >60 mL/min (ref >60)
Glucose, Bld: 97 mg/dL (ref 70–99)
Potassium: 4.4 mmol/L (ref 3.5–5.1)
Sodium: 141 mmol/L (ref 135–145)

## 2024-03-14 LAB — CBC
HCT: 41.6 % (ref 36.0–46.0)
Hemoglobin: 13.5 g/dL (ref 12.0–15.0)
MCH: 22 pg — ABNORMAL LOW (ref 26.0–34.0)
MCHC: 32.5 g/dL (ref 30.0–36.0)
MCV: 67.9 fL — ABNORMAL LOW (ref 80.0–100.0)
Platelets: 230 K/uL (ref 150–400)
RBC: 6.13 MIL/uL — ABNORMAL HIGH (ref 3.87–5.11)
RDW: 14.7 % (ref 11.5–15.5)
WBC: 6.8 K/uL (ref 4.0–10.5)
nRBC: 0 % (ref 0.0–0.2)

## 2024-03-14 NOTE — Progress Notes (Signed)

## 2024-03-14 NOTE — Telephone Encounter (Signed)
 I wrote the note for her to be out of work for up to 3 months following surgery. Note was hand delivered to patient in the lobby.

## 2024-03-19 ENCOUNTER — Ambulatory Visit (HOSPITAL_BASED_OUTPATIENT_CLINIC_OR_DEPARTMENT_OTHER): Admitting: Anesthesiology

## 2024-03-19 ENCOUNTER — Ambulatory Visit (HOSPITAL_BASED_OUTPATIENT_CLINIC_OR_DEPARTMENT_OTHER)
Admission: RE | Admit: 2024-03-19 | Discharge: 2024-03-19 | Disposition: A | Discharge status: Home / Self Care | Attending: Orthopedic Surgery | Admitting: Orthopedic Surgery

## 2024-03-19 ENCOUNTER — Encounter (HOSPITAL_BASED_OUTPATIENT_CLINIC_OR_DEPARTMENT_OTHER): Payer: Self-pay | Admitting: Orthopedic Surgery

## 2024-03-19 ENCOUNTER — Other Ambulatory Visit: Payer: Self-pay

## 2024-03-19 ENCOUNTER — Encounter (HOSPITAL_BASED_OUTPATIENT_CLINIC_OR_DEPARTMENT_OTHER)
Admission: RE | Disposition: A | Discharge status: Home / Self Care | Payer: Self-pay | Source: Home / Self Care | Attending: Orthopedic Surgery

## 2024-03-19 DIAGNOSIS — M65911 Unspecified synovitis and tenosynovitis, right shoulder: Secondary | ICD-10-CM

## 2024-03-19 DIAGNOSIS — M7521 Bicipital tendinitis, right shoulder: Secondary | ICD-10-CM

## 2024-03-19 DIAGNOSIS — Z79899 Other long term (current) drug therapy: Secondary | ICD-10-CM | POA: Insufficient documentation

## 2024-03-19 DIAGNOSIS — K219 Gastro-esophageal reflux disease without esophagitis: Secondary | ICD-10-CM | POA: Diagnosis not present

## 2024-03-19 DIAGNOSIS — M75101 Unspecified rotator cuff tear or rupture of right shoulder, not specified as traumatic: Secondary | ICD-10-CM

## 2024-03-19 DIAGNOSIS — I10 Essential (primary) hypertension: Secondary | ICD-10-CM | POA: Diagnosis not present

## 2024-03-19 DIAGNOSIS — Z6831 Body mass index (BMI) 31.0-31.9, adult: Secondary | ICD-10-CM | POA: Insufficient documentation

## 2024-03-19 DIAGNOSIS — M7501 Adhesive capsulitis of right shoulder: Secondary | ICD-10-CM | POA: Diagnosis not present

## 2024-03-19 DIAGNOSIS — S46011A Strain of muscle(s) and tendon(s) of the rotator cuff of right shoulder, initial encounter: Secondary | ICD-10-CM | POA: Insufficient documentation

## 2024-03-19 DIAGNOSIS — F17210 Nicotine dependence, cigarettes, uncomplicated: Secondary | ICD-10-CM | POA: Diagnosis not present

## 2024-03-19 DIAGNOSIS — M75121 Complete rotator cuff tear or rupture of right shoulder, not specified as traumatic: Secondary | ICD-10-CM | POA: Diagnosis not present

## 2024-03-19 DIAGNOSIS — D573 Sickle-cell trait: Secondary | ICD-10-CM | POA: Insufficient documentation

## 2024-03-19 DIAGNOSIS — E669 Obesity, unspecified: Secondary | ICD-10-CM | POA: Insufficient documentation

## 2024-03-19 DIAGNOSIS — J449 Chronic obstructive pulmonary disease, unspecified: Secondary | ICD-10-CM | POA: Insufficient documentation

## 2024-03-19 DIAGNOSIS — E119 Type 2 diabetes mellitus without complications: Secondary | ICD-10-CM | POA: Diagnosis not present

## 2024-03-19 DIAGNOSIS — S43431A Superior glenoid labrum lesion of right shoulder, initial encounter: Secondary | ICD-10-CM | POA: Diagnosis not present

## 2024-03-19 DIAGNOSIS — W19XXXA Unspecified fall, initial encounter: Secondary | ICD-10-CM | POA: Diagnosis not present

## 2024-03-19 DIAGNOSIS — Z01818 Encounter for other preprocedural examination: Secondary | ICD-10-CM

## 2024-03-19 HISTORY — PX: POSTERIOR LUMBAR FUSION 2 WITH HARDWARE REMOVAL: SHX7297

## 2024-03-19 HISTORY — PX: SHOULDER OPEN ROTATOR CUFF REPAIR: SHX2407

## 2024-03-19 HISTORY — DX: Type 2 diabetes mellitus without complications: E11.9

## 2024-03-19 HISTORY — DX: Encounter for other specified aftercare: Z51.89

## 2024-03-19 HISTORY — PX: BICEPT TENODESIS: SHX5116

## 2024-03-19 HISTORY — DX: Chronic obstructive pulmonary disease, unspecified: J44.9

## 2024-03-19 LAB — GLUCOSE, CAPILLARY
Glucose-Capillary: 118 mg/dL — ABNORMAL HIGH (ref 70–99)
Glucose-Capillary: 127 mg/dL — ABNORMAL HIGH (ref 70–99)

## 2024-03-19 SURGERY — ARTHROSCOPY, SHOULDER WITH DEBRIDEMENT
Anesthesia: General | Site: Shoulder | Laterality: Right

## 2024-03-19 MED ORDER — SODIUM CHLORIDE 0.9 % IR SOLN
Status: DC | PRN
  Administered 2024-03-19: 1

## 2024-03-19 MED ORDER — FENTANYL CITRATE (PF) 100 MCG/2ML IJ SOLN
INTRAMUSCULAR | Status: AC
Start: 2024-03-19 — End: 2024-03-19
  Filled 2024-03-19: qty 2

## 2024-03-19 MED ORDER — METHOCARBAMOL 500 MG PO TABS: 500.0000 mg | ORAL_TABLET | Freq: Three times a day (TID) | ORAL | 1 refills | Status: DC | PRN

## 2024-03-19 MED ORDER — CEFAZOLIN SODIUM-DEXTROSE 2-4 GM/100ML-% IV SOLN
INTRAVENOUS | Status: AC
Start: 2024-03-19 — End: 2024-03-19
  Filled 2024-03-19: qty 100

## 2024-03-19 MED ORDER — GABAPENTIN 300 MG PO CAPS
300.0000 mg | ORAL_CAPSULE | Freq: Once | ORAL | Status: AC
  Administered 2024-03-19: 300 mg via ORAL

## 2024-03-19 MED ORDER — MIDAZOLAM HCL 2 MG/2ML IJ SOLN
INTRAMUSCULAR | Status: AC
  Filled 2024-03-19: qty 2

## 2024-03-19 MED ORDER — LIDOCAINE 2% (20 MG/ML) 5 ML SYRINGE
INTRAMUSCULAR | Status: AC
  Filled 2024-03-19: qty 5

## 2024-03-19 MED ORDER — FENTANYL CITRATE (PF) 100 MCG/2ML IJ SOLN: 25.0000 ug | INTRAMUSCULAR | Status: DC | PRN

## 2024-03-19 MED ORDER — BUPIVACAINE LIPOSOME 1.3 % IJ SUSP
INTRAMUSCULAR | Status: DC | PRN
Start: 2024-03-19 — End: 2024-03-19
  Administered 2024-03-19: 10 mL via PERINEURAL

## 2024-03-19 MED ORDER — TRANEXAMIC ACID-NACL 1000-0.7 MG/100ML-% IV SOLN
1000.0000 mg | INTRAVENOUS | Status: AC
  Administered 2024-03-19: 1000 mg via INTRAVENOUS

## 2024-03-19 MED ORDER — MIDAZOLAM HCL 2 MG/2ML IJ SOLN
2.0000 mg | Freq: Once | INTRAMUSCULAR | Status: AC
  Administered 2024-03-19: 2 mg via INTRAVENOUS

## 2024-03-19 MED ORDER — LACTATED RINGERS IV SOLN: INTRAVENOUS | Status: DC

## 2024-03-19 MED ORDER — ONDANSETRON HCL 4 MG/2ML IJ SOLN
INTRAMUSCULAR | Status: DC | PRN
  Administered 2024-03-19: 4 mg via INTRAVENOUS

## 2024-03-19 MED ORDER — CEFAZOLIN SODIUM-DEXTROSE 2-4 GM/100ML-% IV SOLN
2.0000 g | INTRAVENOUS | Status: AC
Start: 2024-03-19 — End: 2024-03-19
  Administered 2024-03-19: 2 g via INTRAVENOUS

## 2024-03-19 MED ORDER — OXYCODONE HCL 5 MG PO TABS: 5.0000 mg | ORAL_TABLET | ORAL | 0 refills | Status: DC | PRN

## 2024-03-19 MED ORDER — ROCURONIUM BROMIDE 100 MG/10ML IV SOLN
INTRAVENOUS | Status: DC | PRN
  Administered 2024-03-19: 10 mg via INTRAVENOUS
  Administered 2024-03-19: 50 mg via INTRAVENOUS

## 2024-03-19 MED ORDER — ACETAMINOPHEN 500 MG PO TABS
ORAL_TABLET | ORAL | Status: AC
  Filled 2024-03-19: qty 2

## 2024-03-19 MED ORDER — PHENYLEPHRINE HCL (PRESSORS) 10 MG/ML IV SOLN
INTRAVENOUS | Status: DC | PRN
  Administered 2024-03-19: 80 ug via INTRAVENOUS

## 2024-03-19 MED ORDER — METOPROLOL TARTRATE 5 MG/5ML IV SOLN
INTRAVENOUS | Status: AC
  Filled 2024-03-19: qty 5

## 2024-03-19 MED ORDER — GABAPENTIN 300 MG PO CAPS
ORAL_CAPSULE | ORAL | Status: AC
  Filled 2024-03-19: qty 1

## 2024-03-19 MED ORDER — TRANEXAMIC ACID-NACL 1000-0.7 MG/100ML-% IV SOLN
INTRAVENOUS | Status: AC
  Filled 2024-03-19: qty 100

## 2024-03-19 MED ORDER — BUPIVACAINE HCL (PF) 0.5 % IJ SOLN
INTRAMUSCULAR | Status: DC | PRN
  Administered 2024-03-19: 10 mL via PERINEURAL

## 2024-03-19 MED ORDER — AMISULPRIDE (ANTIEMETIC) 5 MG/2ML IV SOLN: 10.0000 mg | Freq: Once | INTRAVENOUS | Status: DC | PRN

## 2024-03-19 MED ORDER — FENTANYL CITRATE (PF) 100 MCG/2ML IJ SOLN
INTRAMUSCULAR | Status: DC | PRN
  Administered 2024-03-19: 50 ug via INTRAVENOUS

## 2024-03-19 MED ORDER — ACETAMINOPHEN 500 MG PO TABS
1000.0000 mg | ORAL_TABLET | Freq: Once | ORAL | Status: AC
  Administered 2024-03-19: 1000 mg via ORAL

## 2024-03-19 MED ORDER — PROPOFOL 10 MG/ML IV BOLUS
INTRAVENOUS | Status: DC | PRN
  Administered 2024-03-19: 150 mg via INTRAVENOUS

## 2024-03-19 MED ORDER — PROPOFOL 10 MG/ML IV BOLUS
INTRAVENOUS | Status: AC
  Filled 2024-03-19: qty 20

## 2024-03-19 MED ORDER — DEXAMETHASONE SODIUM PHOSPHATE 10 MG/ML IJ SOLN
INTRAMUSCULAR | Status: AC
  Filled 2024-03-19: qty 1

## 2024-03-19 MED ORDER — SUGAMMADEX SODIUM 200 MG/2ML IV SOLN
INTRAVENOUS | Status: DC | PRN
Start: 2024-03-19 — End: 2024-03-19
  Administered 2024-03-19: 200 mg via INTRAVENOUS

## 2024-03-19 MED ORDER — LIDOCAINE HCL (CARDIAC) PF 100 MG/5ML IV SOSY
PREFILLED_SYRINGE | INTRAVENOUS | Status: DC | PRN
  Administered 2024-03-19: 60 mg via INTRAVENOUS

## 2024-03-19 MED ORDER — ONDANSETRON HCL 4 MG/2ML IJ SOLN
INTRAMUSCULAR | Status: AC
  Filled 2024-03-19: qty 2

## 2024-03-19 MED ORDER — ROCURONIUM BROMIDE 10 MG/ML (PF) SYRINGE
PREFILLED_SYRINGE | INTRAVENOUS | Status: AC
  Filled 2024-03-19: qty 10

## 2024-03-19 MED ORDER — PHENYLEPHRINE HCL-NACL 20-0.9 MG/250ML-% IV SOLN
INTRAVENOUS | Status: DC | PRN
Start: 2024-03-19 — End: 2024-03-19
  Administered 2024-03-19: 50 ug/min via INTRAVENOUS

## 2024-03-19 MED ORDER — CELECOXIB 200 MG PO CAPS
ORAL_CAPSULE | ORAL | Status: AC
Start: 2024-03-19 — End: 2024-03-19
  Filled 2024-03-19: qty 1

## 2024-03-19 MED ORDER — FENTANYL CITRATE (PF) 100 MCG/2ML IJ SOLN
100.0000 ug | Freq: Once | INTRAMUSCULAR | Status: AC
  Administered 2024-03-19: 50 ug via INTRAVENOUS

## 2024-03-19 MED ORDER — PHENYLEPHRINE 80 MCG/ML (10ML) SYRINGE FOR IV PUSH (FOR BLOOD PRESSURE SUPPORT)
PREFILLED_SYRINGE | INTRAVENOUS | Status: AC
  Filled 2024-03-19: qty 10

## 2024-03-19 MED ORDER — SUGAMMADEX SODIUM 200 MG/2ML IV SOLN
INTRAVENOUS | Status: AC
  Filled 2024-03-19: qty 2

## 2024-03-19 MED ORDER — CELECOXIB 100 MG PO CAPS: 100.0000 mg | ORAL_CAPSULE | Freq: Two times a day (BID) | ORAL | 0 refills | Status: DC

## 2024-03-19 MED ORDER — DEXAMETHASONE SODIUM PHOSPHATE 10 MG/ML IJ SOLN
INTRAMUSCULAR | Status: DC | PRN
  Administered 2024-03-19: 10 mg via INTRAVENOUS

## 2024-03-19 MED ORDER — METOPROLOL TARTRATE 5 MG/5ML IV SOLN
INTRAVENOUS | Status: DC | PRN
  Administered 2024-03-19 (×2): 1.5 mg via INTRAVENOUS

## 2024-03-19 MED ORDER — CELECOXIB 200 MG PO CAPS
200.0000 mg | ORAL_CAPSULE | Freq: Once | ORAL | Status: AC
  Administered 2024-03-19: 200 mg via ORAL

## 2024-03-19 SURGICAL SUPPLY — 52 items
ANCHOR FBRTK 2.6 SUTURETAP 1.3 (Anchor) IMPLANT
ANCHOR SUT 1.8 FIBERTAK SB KL (Anchor) IMPLANT
ANCHOR SWIVELOCK BIO 4.75X19.1 (Anchor) IMPLANT
BLADE SURG 15 STRL LF DISP TIS (BLADE) IMPLANT
COOLER ICEMAN CLASSIC (MISCELLANEOUS) ×2 IMPLANT
DISSECTOR 4.0MM X 13CM (MISCELLANEOUS) ×2 IMPLANT
DRAPE IMP U-DRAPE 54X76 (DRAPES) ×2 IMPLANT
DRAPE INCISE IOBAN 66X45 STRL (DRAPES) ×4 IMPLANT
DRAPE STERI 35X30 U-POUCH (DRAPES) ×2 IMPLANT
DRAPE U-SHAPE 47X51 STRL (DRAPES) ×4 IMPLANT
DRAPE U-SHAPE 76X120 STRL (DRAPES) ×4 IMPLANT
DRSG TEGADERM 4X4.75 (GAUZE/BANDAGES/DRESSINGS) IMPLANT
DURAPREP 26ML APPLICATOR (WOUND CARE) ×2 IMPLANT
DW OUTFLOW CASSETTE/TUBE SET (MISCELLANEOUS) ×2 IMPLANT
ELECTRODE REM PT RTRN 9FT ADLT (ELECTROSURGICAL) ×2 IMPLANT
GAUZE SPONGE 4X4 12PLY STRL (GAUZE/BANDAGES/DRESSINGS) ×2 IMPLANT
GAUZE XEROFORM 1X8 LF (GAUZE/BANDAGES/DRESSINGS) ×2 IMPLANT
GLOVE BIO SURGEON STRL SZ7 (GLOVE) ×2 IMPLANT
GLOVE BIO SURGEON STRL SZ8 (GLOVE) ×2 IMPLANT
GLOVE BIOGEL PI IND STRL 7.0 (GLOVE) ×2 IMPLANT
GLOVE BIOGEL PI IND STRL 8 (GLOVE) ×2 IMPLANT
GOWN STRL REUS W/ TWL LRG LVL3 (GOWN DISPOSABLE) ×4 IMPLANT
GOWN STRL REUS W/ TWL XL LVL3 (GOWN DISPOSABLE) ×2 IMPLANT
KIT STR SPEAR 1.8 FBRTK DISP (KITS) IMPLANT
MANIFOLD NEPTUNE II (INSTRUMENTS) ×2 IMPLANT
NDL HD SCORPION MEGA LOADER (NEEDLE) IMPLANT
NDL SAFETY ECLIPSE 18X1.5 (NEEDLE) ×2 IMPLANT
PACK ARTHROSCOPY DSU (CUSTOM PROCEDURE TRAY) ×2 IMPLANT
PACK BASIN DAY SURGERY FS (CUSTOM PROCEDURE TRAY) ×2 IMPLANT
PAD COLD SHLDR WRAP-ON (PAD) ×2 IMPLANT
PENCIL SMOKE EVACUATOR (MISCELLANEOUS) ×2 IMPLANT
RESTRAINT HEAD UNIVERSAL NS (MISCELLANEOUS) ×2 IMPLANT
SLEEVE SCD COMPRESS KNEE MED (STOCKING) ×2 IMPLANT
SLING ARM FOAM STRAP LRG (SOFTGOODS) IMPLANT
SLING SHLDR PAD UNIV <17 (SOFTGOODS) IMPLANT
SPONGE T-LAP 4X18 ~~LOC~~+RFID (SPONGE) IMPLANT
STRIP CLOSURE SKIN 1/2X4 (GAUZE/BANDAGES/DRESSINGS) IMPLANT
SUCTION TUBE FRAZIER 10FR DISP (SUCTIONS) ×2 IMPLANT
SUT ETHILON 3 0 PS 1 (SUTURE) ×2 IMPLANT
SUT MNCRL AB 3-0 PS2 18 (SUTURE) ×2 IMPLANT
SUT VIC AB 1 CT1 27XBRD ANBCTR (SUTURE) IMPLANT
SUT VIC AB 2-0 CT1 TAPERPNT 27 (SUTURE) IMPLANT
SUT VIC AB 4-0 PS2 18 (SUTURE) ×2 IMPLANT
SUT VICRYL 0 UR6 27IN ABS (SUTURE) IMPLANT
SUTURE 0 FIBERLP 38 BLU TPR ND (SUTURE) IMPLANT
SYR BULB IRRIG 60ML STRL (SYRINGE) IMPLANT
SYSTEM FBRTK BUTTON 2.6 (Anchor) IMPLANT
TOWEL GREEN STERILE FF (TOWEL DISPOSABLE) ×2 IMPLANT
TUBE CONNECTING 20X1/4 (TUBING) ×2 IMPLANT
TUBING ARTHROSCOPY IRRIG 16FT (MISCELLANEOUS) ×2 IMPLANT
WAND ABLATOR APOLLO I90 (BUR) ×2 IMPLANT
YANKAUER SUCT BULB TIP NO VENT (SUCTIONS) IMPLANT

## 2024-03-19 NOTE — H&P (Signed)
 Amanda Garner is an 53 y.o. female.   Chief Complaint: right shoulder pain HPI: Amanda Garner is a 53 y.o. female who presents with right shoulder pain Patient denies any changes in symptoms.  Continues to complain mainly of right shoulder pain.  Localizes pain to the lateral and superior aspects of the shoulder.  Pain is worse with sleeping and with any range of motion of the right shoulder.  She had prior right glenohumeral injection at the end of 2024 with 60 to 70% relief for about 2 days before pain recurred.  She also has symptoms that seem related to her cervical spine with numbness and tingling in her hand that is new and radiation of pain down to the right wrist as well as neck pain and trapezius pain.  She does have history of diabetes but last A1c was 5.8.  She has no significant medical history aside from diabetes but she does smoke about 4 to 5 cigarettes/day.  She works doing Health and safety inspector work.   MRI results revealed:  MRI Results  MR Cervical Spine w/o contrast Result Date: 08/20/2023 CLINICAL DATA:  Neck pain radiating into the right shoulder and right arm for 3 months. History of a fall. EXAM: MRI CERVICAL SPINE WITHOUT CONTRAST TECHNIQUE: Multiplanar, multisequence MR imaging of the cervical spine was performed. No intravenous contrast was administered. COMPARISON:  None Available. FINDINGS: Alignment: There is reversal of the normal cervical lordosis centered about the C5-6 level. No listhesis. Vertebrae: No fracture, evidence of discitis, or bone lesion. Cord: Normal signal throughout. Posterior Fossa, vertebral arteries, paraspinal tissues: Negative. Disc levels: C2-3: Negative. C3-4: Shallow disc bulge.  No stenosis. C4-5: A left paracentral disc protrusion contacts the ventral cord. Mild right foraminal narrowing is present. The left foramen is open. C5-6: There is a disc osteophyte complex and some uncovertebral spurring. Mild flattening of the ventral cord due to bursal lordosis and  spondylosis is present. Mild left foraminal narrowing. The right foramen is open. C6-7: Shallow disc bulge to the right without stenosis. C7-T1: Negative. IMPRESSION: 1. Left paracentral disc protrusion at C4-5 contacts the ventral cord. Mild right foraminal narrowing is present at this level. 2. Reversal of the normal cervical lordosis centered about C5-6 where there is a disc osteophyte complex and uncovertebral spurring. Mild flattening of the ventral cord and mild left foraminal narrowing are present at this level. Electronically Signed   By: Debby Prader M.D.   On: 08/20/2023 11:39    MR SHOULDER RIGHT W CONTRAST Result Date: 08/09/2023 CLINICAL DATA:  Neck pain radiating to the right shoulder. Ongoing for 3 months status post fall EXAM: MRI OF THE RIGHT SHOULDER WITH CONTRAST TECHNIQUE: Multiplanar, multisequence MR imaging of the right shoulder was performed following the administration of intra-articular contrast. CONTRAST:  See Injection Documentation. COMPARISON:  None Available. FINDINGS: Rotator cuff: Complete full-thickness, full width tear of the supraspinatus tendon with 18 mm of retraction. Severe infraspinatus tendinosis. Teres minor tendon is intact. Mild-moderate subscapularis tendinosis. Muscles: No muscle atrophy or edema. No intramuscular fluid collection or hematoma. Biceps Long Head: Moderate-severe tendinosis of the intra-articular portion of the long head of the biceps tendon. Acromioclavicular Joint: Mild arthropathy of the acromioclavicular joint. Contrast in the subacromial/subdeltoid bursa. Glenohumeral Joint: Intraarticular contrast distending the joint capsule. Normal glenohumeral ligaments. No chondral defect. Labrum: Intact. Bones: No fracture or dislocation. No aggressive osseous lesion. Other: No fluid collection or hematoma. IMPRESSION: 1. Complete full-thickness, full width tear of the supraspinatus tendon with 18 mm of retraction. 2.  Severe infraspinatus tendinosis. 3.  Mild-moderate subscapularis tendinosis. 4. Moderate-severe tendinosis of the intra-articular portion of the long head of the biceps tendon. Electronically Signed   By: Julaine Blanch M.D.   On: 08/09/2023 05:58     Past Medical History:  Diagnosis Date   Acid reflux    Blood transfusion    Blood transfusion without reported diagnosis    COPD (chronic obstructive pulmonary disease) (HCC)    Diabetes mellitus without complication (HCC)    Diverticulitis    Hypertension    Sickle cell trait (HCC)     Past Surgical History:  Procedure Laterality Date   ABDOMINAL HYSTERECTOMY  2001   partial hysterectomy - emergency during last C-section   CESAREAN SECTION  1991; 1997; 2001   CESAREAN SECTION     COLON SURGERY  11/04/2021   FLEXIBLE SIGMOIDOSCOPY N/A 11/04/2021   Procedure: FLEXIBLE SIGMOIDOSCOPY;  Surgeon: Teresa Lonni HERO, MD;  Location: [facility];  Service: General;  Laterality: N/A;   SMALL INTESTINE SURGERY  11/04/2021   XI ROBOTIC ASSISTED LOWER ANTERIOR RESECTION N/A 11/04/2021   Procedure: XI ROBOTIC ASSISTED LOWER ANTERIOR RESECTION, RESECTION OF COLOVAGINAL FISTULA, BILATERAL TAP BLOCK, INJECTION OF FIREFLY;  Surgeon: Teresa Lonni HERO, MD;  Location: [facility];  Service: General;  Laterality: N/A;    Family History  Problem Relation Age of Onset   Hypertension Mother    Diabetes Mother    Diabetes type II Mother    Diabetes type II Father    Stroke Father    Lung cancer Father 3   Esophageal cancer Maternal Grandfather    Colon cancer Neg Hx    Pancreatic cancer Neg Hx    Stomach cancer Neg Hx    Social History:  reports that she has been smoking cigarettes. She has a 15.5 pack-year smoking history. She has never used smokeless tobacco. She reports current alcohol use of about 1.0 standard drink of alcohol per week. She reports current drug use. Frequency: 7.00 times per week. Drug: Marijuana.  Allergies: No Known Allergies  Medications Prior to Admission   Medication Sig Dispense Refill   albuterol  (PROVENTIL ) (2.5 MG/3ML) 0.083% nebulizer solution Take 3 mLs (2.5 mg total) by nebulization every 6 (six) hours as needed for wheezing or shortness of breath. 150 mL 1   albuterol  (VENTOLIN  HFA) 108 (90 Base) MCG/ACT inhaler INHALE 1-2 PUFFS BY MOUTH EVERY 6 HOURS AS NEEDED FOR WHEEZE OR SHORTNESS OF BREATH 8.5 each 1   amLODipine  (NORVASC ) 10 MG tablet Take 1 tablet (10 mg total) by mouth daily. 90 tablet 1   carvedilol  (COREG ) 3.125 MG tablet Take 1 tablet (3.125 mg total) by mouth 2 (two) times daily. 180 tablet 1   fluticasone -salmeterol (WIXELA INHUB) 250-50 MCG/ACT AEPB Inhale 1 puff into the lungs in the morning and at bedtime. 60 each 2   gabapentin  (NEURONTIN ) 300 MG capsule Take 1 capsule (300 mg total) by mouth at bedtime. Can increase to twice daily if needed. 30 capsule 3   naproxen  (NAPROSYN ) 500 MG tablet TAKE 1 TABLET(500 MG) BY MOUTH TWICE DAILY WITH A MEAL (Patient taking differently: Take 500 mg by mouth daily as needed for moderate pain (pain score 4-6).) 60 tablet 0   omeprazole  (PRILOSEC) 20 MG capsule TAKE 1 CAPSULE BY MOUTH EVERY DAY 90 capsule 1   rosuvastatin  (CRESTOR ) 5 MG tablet Take 1 tablet (5 mg total) by mouth daily. 90 tablet 1   traZODone  (DESYREL ) 50 MG tablet Take 1 tablet (50 mg  total) by mouth at bedtime. 20 tablet 0   Accu-Chek FastClix Lancets MISC Use as directed to check blood glucose once a day before breakfast. 100 each 1   Blood Glucose Monitoring Suppl (BLOOD GLUCOSE MONITOR SYSTEM) w/Device KIT Use as directed to check blood glucose once a day before breakfast. 1 kit 0   buPROPion  (WELLBUTRIN  XL) 150 MG 24 hr tablet Take 150 mg by mouth daily.     Continuous Glucose Receiver (FREESTYLE LIBRE 3 READER) DEVI Use to check blood sugar continuously. 1 each 0   Continuous Glucose Sensor (FREESTYLE LIBRE 3 PLUS SENSOR) MISC Use to check blood sugar continuously. Change sensor every 15 days. 2 each 6   glucose blood  test strip Use to test blood glucose daily before breakfast. 100 each 0   Misc. Devices MISC Nebulizer device . Diagnosis -bronchitis 1 each 0   nicotine  (NICODERM CQ ) 7 mg/24hr patch Place 1 patch (7 mg total) onto the skin daily. 28 patch 3   ondansetron  (ZOFRAN -ODT) 4 MG disintegrating tablet Take 1 tablet (4 mg total) by mouth every 8 (eight) hours as needed for nausea or vomiting. 20 tablet 0   tirzepatide  (MOUNJARO ) 15 MG/0.5ML Pen Inject 15 mg into the skin once a week. 6 mL 6   traMADol  (ULTRAM ) 50 MG tablet Take 1 tablet (50 mg total) by mouth at bedtime as needed. 20 tablet 0    Results for orders placed or performed during the hospital encounter of 03/19/24 (from the past 48 hours)  Glucose, capillary     Status: Abnormal   Collection Time: 03/19/24  6:28 AM  Result Value Ref Range   Glucose-Capillary 118 (H) 70 - 99 mg/dL    Comment: Glucose reference range applies only to samples taken after fasting for at least 8 hours.   No results found.  Review of Systems  Musculoskeletal:  Positive for arthralgias.  All other systems reviewed and are negative.   Blood pressure (!) 143/87, pulse 89, temperature (!) 97 F (36.1 C), temperature source Temporal, resp. rate 16, height 5' 6 (1.676 m), weight 88.1 kg, last menstrual period 08/20/2011, SpO2 99%. Physical Exam Vitals reviewed.  HENT:     Head: Normocephalic.     Nose: Nose normal.     Mouth/Throat:     Mouth: Mucous membranes are moist.  Eyes:     Pupils: Pupils are equal, round, and reactive to light.  Cardiovascular:     Rate and Rhythm: Normal rate.     Pulses: Normal pulses.  Pulmonary:     Effort: Pulmonary effort is normal.  Abdominal:     General: Abdomen is flat.  Musculoskeletal:     Cervical back: Normal range of motion.  Skin:    General: Skin is warm.     Capillary Refill: Capillary refill takes less than 2 seconds.  Neurological:     General: No focal deficit present.     Mental Status: She is  alert.  Psychiatric:        Mood and Affect: Mood normal.    Ortho exam demonstrates good cervical spine range of motion.  MRI of this right shoulder does demonstrate complete full-thickness full width tear of the supraspinatus tendon with about a centimeter half of retraction.  Biceps also shows severe tendinosis of the intra-articular portion of the biceps.   Patient has slight weakness to external rotation on the right compared to the left subscap strength is intact.  Positive bicipital groove tenderness on the right  versus left.  No discrete AC joint tenderness right versus left.  Positive O'Brien's testing on the right negative on the left.  Shoulder range of motion otherwise symmetric passively.  Does have a fair amount of pain and apprehension with passive shoulder range of motion above 90 degrees of abduction.  Assessment/Plan  Impression is right shoulder pain. And rct with some lingering adhesive capsulitis.  Will be a difficult first 2 weeks after surgery.  Importance of passive range of motion discussed.  Risk and benefits discussed as well including not limited to infection nerve vessel damage incomplete pain weak as well as incomplete functional restoration.  Rehab goals discussed.  All questions answered.  We will try her on trazodone  to help her sleep.   KANDICE Glendia Hutchinson, MD 03/19/2024, 6:45 AM

## 2024-03-19 NOTE — Transfer of Care (Signed)
 Immediate Anesthesia Transfer of Care Note  Patient: Amanda Garner  Procedure(s) Performed: ARTHROSCOPY, SHOULDER WITH DEBRIDEMENT (Right) TENODESIS, BICEPS OPEN (Right) REPAIR, ROTATOR CUFF, OPEN (Right: Shoulder)  Patient Location: PACU  Anesthesia Type:GA combined with regional for post-op pain  Level of Consciousness: drowsy, patient cooperative, and responds to stimulation  Airway & Oxygen Therapy: Patient Spontanous Breathing and Patient connected to face mask oxygen  Post-op Assessment: Report given to RN and Post -op Vital signs reviewed and stable  Post vital signs: Reviewed and stable  Last Vitals:  Vitals Value Taken Time  BP    Temp    Pulse 92 03/19/24 10:22  Resp 17 03/19/24 10:22  SpO2 98 % 03/19/24 10:22  Vitals shown include unfiled device data.  Last Pain:  Vitals:   03/19/24 9367  TempSrc: Temporal  PainSc: 8       Patients Stated Pain Goal: 5 (03/19/24 9367)  Complications: No notable events documented.

## 2024-03-19 NOTE — Brief Op Note (Signed)
    03/19/2024  10:27 AM  PATIENT:  Amanda Garner  53 y.o. female  PRE-OPERATIVE DIAGNOSIS:  right shoulder rotator cuff tear, biceps tendinitis  POST-OPERATIVE DIAGNOSIS:  right shoulder rotator cuff tear, biceps tendinitis  PROCEDURE:  Procedure(s): ARTHROSCOPY, SHOULDER WITH DEBRIDEMENT TENODESIS, BICEPS OPEN REPAIR, ROTATOR CUFF, OPEN  SURGEON:  Surgeon(s): Addie, Cordella Hamilton, MD  ASSISTANT: magnant pa  ANESTHESIA:   general  EBL: 25 ml    Total I/O In: 1000 [I.V.:800; IV Piggyback:200] Out: 50 [Blood:50]  BLOOD ADMINISTERED: none  DRAINS: none   LOCAL MEDICATIONS USED:  none  SPECIMEN:  No Specimen  COUNTS:  YES  TOURNIQUET:  * No tourniquets in log *  DICTATION: [id number]. PLAN OF CARE: Discharge to home after PACU  PATIENT DISPOSITION:  PACU - hemodynamically stable

## 2024-03-19 NOTE — Discharge Instructions (Addendum)
 Post Anesthesia Home Care Instructions  Activity: Get plenty of rest for the remainder of the day. A responsible individual must stay with you for 24 hours following the procedure.  For the next 24 hours, DO NOT: -Drive a car -Advertising copywriter -Drink alcoholic beverages -Take any medication unless instructed by your physician -Make any legal decisions or sign important papers.  Meals: Start with liquid foods such as gelatin or soup. Progress to regular foods as tolerated. Avoid greasy, spicy, heavy foods. If nausea and/or vomiting occur, drink only clear liquids until the nausea and/or vomiting subsides. Call your physician if vomiting continues.  Special Instructions/Symptoms: Your throat may feel dry or sore from the anesthesia or the breathing tube placed in your throat during surgery. If this causes discomfort, gargle with warm salt water . The discomfort should disappear within 24 hours.  If you had a scopolamine  patch placed behind your ear for the management of post- operative nausea and/or vomiting:  1. The medication in the patch is effective for 72 hours, after which it should be removed.  Wrap patch in a tissue and discard in the trash. Wash hands thoroughly with soap and water . 2. You may remove the patch earlier than 72 hours if you experience unpleasant side effects which may include dry mouth, dizziness or visual disturbances. 3. Avoid touching the patch. Wash your hands with soap and water  after contact with the patch.     Regional Anesthesia Blocks  1. You may not be able to move or feel the blocked extremity after a regional anesthetic block. This may last may last from 3-48 hours after placement, but it will go away. The length of time depends on the medication injected and your individual response to the medication. As the nerves start to wake up, you may experience tingling as the movement and feeling returns to your extremity. If the numbness and inability to move  your extremity has not gone away after 48 hours, please call your surgeon.   2. The extremity that is blocked will need to be protected until the numbness is gone and the strength has returned. Because you cannot feel it, you will need to take extra care to avoid injury. Because it may be weak, you may have difficulty moving it or using it. You may not know what position it is in without looking at it while the block is in effect.  3. For blocks in the legs and feet, returning to weight bearing and walking needs to be done carefully. You will need to wait until the numbness is entirely gone and the strength has returned. You should be able to move your leg and foot normally before you try and bear weight or walk. You will need someone to be with you when you first try to ensure you do not fall and possibly risk injury.  4. Bruising and tenderness at the needle site are common side effects and will resolve in a few days.  5. Persistent numbness or new problems with movement should be communicated to the surgeon or the [facility] [phone] [facility] [phone]).  Information for Discharge Teaching: EXPAREL  (bupivacaine  liposome injectable suspension)   Pain relief is important to your recovery. The goal is to control your pain so you can move easier and return to your normal activities as soon as possible after your procedure. Your physician may use several types of medicines to manage pain, swelling, and more.  Your surgeon or anesthesiologist gave you  EXPAREL (bupivacaine ) to help control your pain after surgery.  EXPAREL  is a local anesthetic designed to release slowly over an extended period of time to provide pain relief by numbing the tissue around the surgical site. EXPAREL  is designed to release pain medication over time and can control pain for up to 72 hours. Depending on how you respond to EXPAREL , you may require less pain medication during your  recovery. EXPAREL  can help reduce or eliminate the need for opioids during the first few days after surgery when pain relief is needed the most. EXPAREL  is not an opioid and is not addictive. It does not cause sleepiness or sedation.   Important! A teal colored band has been placed on your arm with the date, time and amount of EXPAREL  you have received. Please leave this armband in place for the full 96 hours following administration, and then you may remove the band. If you return to the hospital for any reason within 96 hours following the administration of EXPAREL , the armband provides important information that your health care providers to know, and alerts them that you have received this anesthetic.    Possible side effects of EXPAREL : Temporary loss of sensation or ability to move in the area where medication was injected. Nausea, vomiting, constipation Rarely, numbness and tingling in your mouth or lips, lightheadedness, or anxiety may occur. Call your doctor right away if you think you may be experiencing any of these sensations, or if you have other questions regarding possible side effects.  Follow all other discharge instructions given to you by your surgeon or nurse. Eat a healthy diet and drink plenty of water  or other fluids.   Tylenol  and ibuprofen  can be taken after 1:30 pm

## 2024-03-19 NOTE — Anesthesia Procedure Notes (Signed)
 Anesthesia Regional Block: Interscalene brachial plexus block   Pre-Anesthetic Checklist: , timeout performed,  Correct Patient, Correct Site, Correct Laterality,  Correct Procedure, Correct Position, site marked,  Risks and benefits discussed,  Surgical consent,  Pre-op evaluation,  At surgeon's request and post-op pain management  Laterality: Right  Prep: chloraprep       Needles:  Injection technique: Single-shot  Needle Type: Echogenic Needle     Needle Length: 9cm  Needle Gauge: 21     Additional Needles:   Procedures:,,,, ultrasound used (permanent image in chart),,    Narrative:  Start time: 03/19/2024 6:58 AM End time: 03/19/2024 7:04 AM Injection made incrementally with aspirations every 5 mL.  Performed by: Personally  Anesthesiologist: Corinne Garnette BRAVO, MD  Additional Notes: No pain on injection. No increased resistance to injection. Injection made in 5cc increments.  Good needle visualization.  Patient tolerated procedure well.

## 2024-03-19 NOTE — Anesthesia Preprocedure Evaluation (Addendum)
 Anesthesia Evaluation  Patient identified by MRN, date of birth, ID band Patient awake    Reviewed: Allergy & Precautions, NPO status , Patient's Chart, lab work & pertinent test results, reviewed documented beta blocker date and time   Airway Mallampati: II  TM Distance: >3 FB Neck ROM: Full    Dental  (+) Dental Advisory Given, Missing   Pulmonary COPD,  COPD inhaler, Current Smoker and Patient abstained from smoking.   Pulmonary exam normal breath sounds clear to auscultation       Cardiovascular hypertension, Pt. on home beta blockers and Pt. on medications (-) angina (-) Past MI Normal cardiovascular exam Rhythm:Regular Rate:Normal     Neuro/Psych negative neurological ROS     GI/Hepatic ,GERD  Medicated,,(+)     substance abuse  marijuana use  Endo/Other  diabetes, Type 2  Obesity   Renal/GU negative Renal ROS     Musculoskeletal negative musculoskeletal ROS (+)  right shoulder rotator cuff tear, biceps tendinitis   Abdominal   Peds  Hematology  (+) Blood dyscrasia, Sickle cell trait   Anesthesia Other Findings Day of surgery medications reviewed with the patient.  Reproductive/Obstetrics                              Anesthesia Physical Anesthesia Plan  ASA: 2  Anesthesia Plan: General   Post-op Pain Management: Tylenol  PO (pre-op)* and Regional block*   Induction: Intravenous  PONV Risk Score and Plan: 2 and Midazolam , Dexamethasone  and Ondansetron   Airway Management Planned: Oral ETT  Additional Equipment:   Intra-op Plan:   Post-operative Plan: Extubation in OR  Informed Consent: I have reviewed the patients History and Physical, chart, labs and discussed the procedure including the risks, benefits and alternatives for the proposed anesthesia with the patient or authorized representative who has indicated his/her understanding and acceptance.     Dental  advisory given  Plan Discussed with: CRNA  Anesthesia Plan Comments:          Anesthesia Quick Evaluation

## 2024-03-19 NOTE — Anesthesia Procedure Notes (Signed)
 Procedure Name: Intubation Date/Time: 03/19/2024 7:49 AM  Performed by: Frost Kayla MATSU, CRNAPre-anesthesia Checklist: Patient identified, Emergency Drugs available, Suction available and Patient being monitored Patient Re-evaluated:Patient Re-evaluated prior to induction Oxygen Delivery Method: Circle system utilized Preoxygenation: Pre-oxygenation with 100% oxygen Induction Type: IV induction Ventilation: Mask ventilation without difficulty Laryngoscope Size: Miller and 3 Grade View: Grade II Tube type: Oral Tube size: 7.0 mm Number of attempts: 1 Airway Equipment and Method: Oral airway and Bougie stylet Placement Confirmation: ETT inserted through vocal cords under direct vision, positive ETCO2 and breath sounds checked- equal and bilateral Secured at: 21 cm Tube secured with: Tape Dental Injury: Teeth and Oropharynx as per pre-operative assessment

## 2024-03-19 NOTE — Anesthesia Postprocedure Evaluation (Signed)
 Anesthesia Post Note  Patient: Amanda Garner  Procedure(s) Performed: ARTHROSCOPY, SHOULDER WITH DEBRIDEMENT (Right) TENODESIS, BICEPS OPEN (Right) REPAIR, ROTATOR CUFF, OPEN (Right: Shoulder)     Patient location during evaluation: PACU Anesthesia Type: General Level of consciousness: awake and alert Pain management: pain level controlled Vital Signs Assessment: post-procedure vital signs reviewed and stable Respiratory status: spontaneous breathing, nonlabored ventilation and respiratory function stable Cardiovascular status: blood pressure returned to baseline and stable Postop Assessment: no apparent nausea or vomiting Anesthetic complications: no   No notable events documented.  Last Vitals:  Vitals:   03/19/24 1045 03/19/24 1055  BP: 123/66 122/82  Pulse: 100 96  Resp: 19 18  Temp:  (!) 36.1 C  SpO2: 95% 98%    Last Pain:  Vitals:   03/19/24 1055  TempSrc:   PainSc: 0-No pain                 Garnette FORBES Skillern

## 2024-03-19 NOTE — Op Note (Signed)
 NAME: Amanda Garner, Amanda Garner MEDICAL RECORD NO: [id number] ACCOUNT NO: [id number] DATE OF BIRTH: [birth year] FACILITY: [facility] LOCATION: [facility] PHYSICIAN: Cordella RAMAN. Addie, MD  Operative Report   DATE OF PROCEDURE: 03/19/2024  PREOPERATIVE DIAGNOSIS:  Right shoulder rotator cuff tear and biceps tendinitis.  POSTOPERATIVE DIAGNOSIS: Right shoulder rotator cuff tear and biceps tendinitis.  PROCEDURE:  Right shoulder arthroscopy with debridement of the rotator cuff, superior labrum, biceps tendon, and synovitis with subsequent biceps tendon release and mini-open biceps tenodesis and rotator cuff repair using a 2 x 3 construct and 4 margin  convergence sutures of 0 FiberWire.  SURGEON:  Cordella RAMAN. Addie, MD.  ASSISTANT:  Herlene Calix, PA.  INDICATIONS:  The patient is a 53 year old with a long history of right shoulder pain.  MRI scan showed a relatively small rotator cuff tear 7 months ago.  That has gotten larger, and she remains symptomatic.  She presents now for operative management  after explanation of the risks and benefits.  DESCRIPTION OF PROCEDURE:  The patient was brought to the operating room, where general anesthetic was induced.  Preoperative antibiotics were administered.  Timeout was called.  The patient was placed in the beach chair position with the head in neutral  position.  Right shoulder passive range of motion prior to surgery was 75/90/150.  She had about 20 more degrees of forward flexion and abduction on the left-hand side.  The area was prescrubbed with alcohol and Betadine allowed to air dry prepped with  DuraPrep solution and draped in a sterile manner.  Ioban was used to seal the operative field and cover the axilla.  After calling timeout, a posterior portal was created.  An anterior portal was created under direct visualization.  Diagnostic  arthroscopy was performed.  The patient did have some synovitis within the rotator interval.  This was debrided with the  ArthroCare wand on coag function.  Significant biceps tendinosis and tendinitis was present.  A rotator cuff tear was also present,  which was an A-shaped type tear.  There was also some fraying of the upper border of the subscapularis.  This was debrided using a shaver.  The ArthroCare wand was then used to debride the superior labrum and release the biceps tendon.  Glenohumeral  articular surfaces were intact.  Anterior inferior and posterior inferior glenohumeral ligaments were intact.  Following debridement within the shoulder joint, instruments were removed.  Portals were closed using 3-0 nylon.  Ioban was used to cover the  operative field.  About a 4 cm incision was made off the anterolateral margin of the acromion.  Skin and subcutaneous tissue were sharply divided.  The deltoid was split and measured distance of 4 cm from the anterolateral margin of the acromion and  marked with a #1 Vicryl suture.  Bursectomy was performed.  The transverse humeral ligament was opened, and the biceps tendon was grasped with a grasping FiberLink suture.  We then used 2 anchors to secure and tenodese the biceps tendon in the bicipital  groove.  The first anchor was an Arthrex knotless suture anchor, which established the tension.  We then placed the larger Arthrex knotless SutureTak anchor up more proximally and placed the 2 ends of the FiberLink suture into that tendon and then  oversewed the construct with 0 Vicryl suture.  Secure fixation was achieved.  Attention was then directed towards the rotator cuff tear.  This was an A-shaped tear measuring about 2.5 x 3 cm.  Subacromial decompression with acromioplasty was performed.  The margin convergence was then performed using 4-0 FiberWire sutures.  The sutures were placed but not tied.  The landing zone was prepared.  Bone quality was excellent.  At this time, 2 knotless suture anchors were placed at the junction of the  articular surface in the greater tuberosity.   These 8 SutureTapes were then passed with a Scorpion suture passer posterior to anterior with manual tension on the margin convergence sutures.  Next, the margin convergence sutures were tied.  We then  reduced the rotator cuff tear back to its footprint, and the suture tapes were tied x4, and the limbs were crossed in order to maximize footprint compression.  With the arm in adduction, the initial margin convergence sutures were placed into a  SwiveLock.  The other suture tape limbs had been crossed, and we incorporated also a 0 Vicryl suture in the leading edge of the middle portion of that tear in order to obtain a watertight repair.  These sutures were placed in anterior and posterior  SwiveLocks with watertight repair achieved.  The arm was taken through range of motion.  There was no gapping at the repair site.  The extra knotless suture mechanisms were used for accessory fixation of that rotator cuff tear.  Thorough irrigation was  then performed.  The deltoid split was then closed using #1 Vicryl suture followed by interrupted inverted 0 Vicryl suture, 2-0 Vicryl suture, and 3-0 Monocryl with Steri-Strips and impervious Tegaderm dressings applied.  A shoulder immobilizer was  placed.  Luke's assistance was required at all times for retraction, opening and closing, and mobilization of tissue.  His assistance was a medical necessity.   PUS D: 03/19/2024 10:34:51 am T: 03/19/2024 11:02:00 am  JOB: [id number]/ [id number]

## 2024-03-19 NOTE — Progress Notes (Signed)
Assisted Dr. Turk with right, interscalene , ultrasound guided block. Side rails up, monitors on throughout procedure. See vital signs in flow sheet. Tolerated Procedure well. 

## 2024-03-20 ENCOUNTER — Encounter (HOSPITAL_BASED_OUTPATIENT_CLINIC_OR_DEPARTMENT_OTHER): Payer: Self-pay | Admitting: Orthopedic Surgery

## 2024-03-21 ENCOUNTER — Telehealth: Payer: Self-pay | Admitting: Orthopedic Surgery

## 2024-03-21 NOTE — Telephone Encounter (Signed)
 Patient called. Says the pain medication isn't working would like something else called in for her.

## 2024-03-22 ENCOUNTER — Other Ambulatory Visit: Payer: Self-pay | Admitting: Surgical

## 2024-03-22 MED ORDER — HYDROMORPHONE HCL 2 MG PO TABS: 2.0000 mg | ORAL_TABLET | ORAL | 0 refills | Status: DC | PRN

## 2024-03-22 NOTE — Telephone Encounter (Signed)
 Called and spoke with patient. Going to try dilaudid  instead of oxycodone .  This was Rxed to pharmacy

## 2024-03-23 ENCOUNTER — Other Ambulatory Visit: Payer: Self-pay | Admitting: Surgical

## 2024-03-23 ENCOUNTER — Telehealth: Payer: Self-pay

## 2024-03-23 DIAGNOSIS — Z9889 Other specified postprocedural states: Secondary | ICD-10-CM

## 2024-03-23 MED ORDER — HYDROMORPHONE HCL 2 MG PO TABS: 2.0000 mg | ORAL_TABLET | ORAL | 0 refills | Status: AC | PRN

## 2024-03-23 NOTE — Telephone Encounter (Signed)
 noted

## 2024-03-23 NOTE — Telephone Encounter (Signed)
Sent prescription with diagnosis code

## 2024-03-23 NOTE — Telephone Encounter (Signed)
 Patient called stating that her pharmacy told her to let Herlene know that her prescription that was sent in to them is missing some information.  PATIENT USES [facility] ON [address]

## 2024-03-24 DIAGNOSIS — M65911 Unspecified synovitis and tenosynovitis, right shoulder: Secondary | ICD-10-CM

## 2024-03-24 DIAGNOSIS — M7521 Bicipital tendinitis, right shoulder: Secondary | ICD-10-CM

## 2024-03-24 DIAGNOSIS — S43431A Superior glenoid labrum lesion of right shoulder, initial encounter: Secondary | ICD-10-CM

## 2024-03-24 DIAGNOSIS — M75121 Complete rotator cuff tear or rupture of right shoulder, not specified as traumatic: Secondary | ICD-10-CM

## 2024-03-26 ENCOUNTER — Ambulatory Visit (INDEPENDENT_AMBULATORY_CARE_PROVIDER_SITE_OTHER): Admitting: Surgical

## 2024-03-26 DIAGNOSIS — M791 Myalgia, unspecified site: Secondary | ICD-10-CM

## 2024-03-26 DIAGNOSIS — Z9889 Other specified postprocedural states: Secondary | ICD-10-CM

## 2024-03-28 ENCOUNTER — Encounter: Payer: Self-pay | Admitting: Surgical

## 2024-03-28 MED ORDER — METHOCARBAMOL 500 MG PO TABS: 500.0000 mg | ORAL_TABLET | Freq: Three times a day (TID) | ORAL | 1 refills | Status: AC | PRN

## 2024-03-28 NOTE — Progress Notes (Signed)
 Post-Op Visit Note   Patient: Amanda Garner           Date of Birth: 09/21/1970           MRN: [id number] Visit Date: 03/26/2024 PCP: Delbert Clam, MD   Assessment & Plan:  Chief Complaint:  Chief Complaint  Patient presents with   Right Shoulder - Routine Post Op    03/19/2024 right shoulder arthroscopy, deb, MOBT, MORCR   Visit Diagnoses:  1. S/P right rotator cuff repair     Plan: Karyssa Amaral is a 53 y.o. female who presents s/p right shoulder rotator cuff repair and biceps tenodesis on 03/19/2024.  Patient is doing okay and pain is moderate to severe at times but overall controlled .  Performing pendulum exercises.  Denies any chest pain, SOB, fevers, chills. Taking pain medication as prescribed.  On exam, patient has range of motion deferred until next visit due to pain.  Intact EPL, FPL, finger abduction, finger adduction, pronation/supination, bicep, tricep, deltoid of operative extremity.  Axillary nerve intact with deltoid firing.  Incisions are healing well without evidence of infection or dehiscence.  Sutures removed and replaced with Steri-Strips today.  2+ radial pulse of the operative extremity  Plan is continue with pendulum exercises and elbow range of motion exercises.  Continue to avoid any active motion of the right shoulder and do not lift with the right shoulder.  We will plan to see her back in 2 weeks.  She may discontinue the sling and home 1 week from today.  She is having a lot of difficulty sleeping and we discussed trying medication to help with sleep but I did counsel her on the risks of taking sleep medicine and opioid medication at the same time so she will try different positioning at night and see if this can help her sleep.  If no improvement in the next few days, we could consider a medication..   Follow-Up Instructions: No follow-ups on file.   Orders:  No orders of the defined types were placed in this encounter.  No orders of the defined  types were placed in this encounter.   Imaging: No results found.  PMFS History: Patient Active Problem List   Diagnosis Date Noted   Complete tear of right rotator cuff 03/24/2024   Biceps tendonitis on right 03/24/2024   Degenerative superior labral anterior-to-posterior (SLAP) tear of right shoulder 03/24/2024   Synovitis of right shoulder 03/24/2024   COPD exacerbation (HCC) 11/21/2023   Prolonged QT interval 11/21/2023   Lactic acidosis 11/21/2023   Thyroid  nodule 11/21/2023   Type 2 diabetes mellitus (HCC) 11/05/2021   GERD (gastroesophageal reflux disease) 11/05/2021   S/P laparoscopic-assisted sigmoidectomy 11/04/2021   Abnormal CT scan 08/05/2021   Hx of diverticulitis of colon 08/05/2021   Colovaginal fistula 07/03/2021   Acquired palmar and plantar hyperkeratosis 01/25/2021   Hyperglycemia 02/01/2020   Crohn's disease of large intestine with abscess (HCC) 01/31/2020   Acute diverticulitis 01/09/2020   Diverticulitis of large intestine with abscess without bleeding 06/07/2017   Abscess of sigmoid colon due to diverticulitis 05/24/2017   Tobacco dependence 05/11/2017   Essential hypertension 05/11/2017   Reflux esophagitis 05/11/2017   Tinea pedis of left foot 04/11/2015   C. difficile colitis 08/26/2011   Enteritis 08/25/2011   Past Medical History:  Diagnosis Date   Acid reflux    Blood transfusion    Blood transfusion without reported diagnosis    COPD (chronic obstructive pulmonary disease) (HCC)  Diabetes mellitus without complication (HCC)    Diverticulitis    Hypertension    Sickle cell trait (HCC)     Family History  Problem Relation Age of Onset   Hypertension Mother    Diabetes Mother    Diabetes type II Mother    Diabetes type II Father    Stroke Father    Lung cancer Father 90   Esophageal cancer Maternal Grandfather    Colon cancer Neg Hx    Pancreatic cancer Neg Hx    Stomach cancer Neg Hx     Past Surgical History:  Procedure  Laterality Date   ABDOMINAL HYSTERECTOMY  2001   partial hysterectomy - emergency during last C-section   BICEPT TENODESIS Right 03/19/2024   Procedure: TENODESIS, BICEPS OPEN;  Surgeon: Addie Cordella Hamilton, MD;  Location: [facility];  Service: Orthopedics;  Laterality: Right;   CESAREAN SECTION  1991; 1997; 2001   CESAREAN SECTION     COLON SURGERY  11/04/2021   FLEXIBLE SIGMOIDOSCOPY N/A 11/04/2021   Procedure: FLEXIBLE SIGMOIDOSCOPY;  Surgeon: Teresa Lonni HERO, MD;  Location: [facility];  Service: General;  Laterality: N/A;   POSTERIOR LUMBAR FUSION 2 WITH HARDWARE REMOVAL Right 03/19/2024   Procedure: ARTHROSCOPY, SHOULDER WITH DEBRIDEMENT;  Surgeon: Addie Cordella Hamilton, MD;  Location: [facility];  Service: Orthopedics;  Laterality: Right;  right shoulder arthroscopy, debridement, mini open biceps tenodesis and rotator cuff tear repair, mua   SHOULDER OPEN ROTATOR CUFF REPAIR Right 03/19/2024   Procedure: REPAIR, ROTATOR CUFF, OPEN;  Surgeon: Addie Cordella Hamilton, MD;  Location: [facility];  Service: Orthopedics;  Laterality: Right;   SMALL INTESTINE SURGERY  11/04/2021   XI ROBOTIC ASSISTED LOWER ANTERIOR RESECTION N/A 11/04/2021   Procedure: XI ROBOTIC ASSISTED LOWER ANTERIOR RESECTION, RESECTION OF COLOVAGINAL FISTULA, BILATERAL TAP BLOCK, INJECTION OF FIREFLY;  Surgeon: Teresa Lonni HERO, MD;  Location: [facility];  Service: General;  Laterality: N/A;   Social History   Occupational History   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, nature: [employer]    Comment: [employer]  Tobacco Use   Smoking status: Some Days    Current packs/day: 0.50    Average packs/day: 0.5 packs/day for 31.0 years (15.5 ttl pk-yrs)    Types: Cigarettes   Smokeless tobacco: Never  Vaping Use   Vaping status: Never Used  Substance and Sexual Activity   Alcohol use: Yes    Alcohol/week: 1.0 standard drink of alcohol    Types: 1  Standard drinks or equivalent per week    Comment: occassionally   Drug use: Yes    Frequency: 7.0 times per week    Types: Marijuana   Sexual activity: Yes    Partners: Male    Birth control/protection: Surgical

## 2024-04-12 ENCOUNTER — Ambulatory Visit (INDEPENDENT_AMBULATORY_CARE_PROVIDER_SITE_OTHER): Admitting: Surgical

## 2024-04-12 ENCOUNTER — Encounter: Payer: Self-pay | Admitting: Surgical

## 2024-04-12 DIAGNOSIS — Z9889 Other specified postprocedural states: Secondary | ICD-10-CM

## 2024-04-12 MED ORDER — HYDROMORPHONE HCL 2 MG PO TABS: 1.0000 mg | ORAL_TABLET | Freq: Three times a day (TID) | ORAL | 0 refills | Status: DC | PRN

## 2024-04-12 NOTE — Addendum Note (Signed)
 Addended by: TRINDA DEANE HERO on: 04/12/2024 03:14 PM   Modules accepted: Orders

## 2024-04-12 NOTE — Progress Notes (Signed)
 Post-Op Visit Note   Patient: Sarajean Dessert           Date of Birth: 20-Oct-1970           MRN: [id number] Visit Date: 04/12/2024 PCP: Delbert Clam, MD   Assessment & Plan:  Chief Complaint:  Chief Complaint  Patient presents with   Right Shoulder - Routine Post Op     03/19/2024 right shoulder arthroscopy, deb, MOBT, MORCR     Visit Diagnoses:  1. S/P right rotator cuff repair     Plan: Patient is a 53 year old female who presents s/p right shoulder rotator cuff tear repair and bicep tenodesis on 03/19/2024.  Doing well overall.  Pain is a lot better than at her last visit.  She has been compliant with no lifting but has done a little bit of active range of motion at times trying to test her shoulder out.  Has been doing pendulum exercises.  On exam, patient has 15 degrees X rotation, 45 degrees abduction, 100 degrees forward elevation passively.  Axillary nerve intact with deltoid firing today.  Incisions are healing well without evidence of infection or dehiscence.  2+ radial pulse of the operative extremity.  No Popeye deformity noted.  Good bicep flexion and supination strength.  Plan at this time is refill pain medicine.  Start physical therapy upstairs.  Home exercise program provided for her today to bridge to gap between now and PT.  In physical therapy, she should be okay for passive and active assisted range of motion.  No rotator cuff strengthening exercises or any full active range of motion until 6 weeks out from surgery.  Need to be somewhat aggressive with achieving as much passive motion as possible due to her adhesive capsulitis present before surgery.   Strongly cautioned patient against any active motion of the shoulder or any lifting with the operative arm until her follow-up visit in 4 weeks with Dr. Addie.  Follow-Up Instructions: No follow-ups on file.   Orders:  No orders of the defined types were placed in this encounter.  No orders of the defined  types were placed in this encounter.   Imaging: No results found.  PMFS History: Patient Active Problem List   Diagnosis Date Noted   Complete tear of right rotator cuff 03/24/2024   Biceps tendonitis on right 03/24/2024   Degenerative superior labral anterior-to-posterior (SLAP) tear of right shoulder 03/24/2024   Synovitis of right shoulder 03/24/2024   COPD exacerbation (HCC) 11/21/2023   Prolonged QT interval 11/21/2023   Lactic acidosis 11/21/2023   Thyroid  nodule 11/21/2023   Type 2 diabetes mellitus (HCC) 11/05/2021   GERD (gastroesophageal reflux disease) 11/05/2021   S/P laparoscopic-assisted sigmoidectomy 11/04/2021   Abnormal CT scan 08/05/2021   Hx of diverticulitis of colon 08/05/2021   Colovaginal fistula 07/03/2021   Acquired palmar and plantar hyperkeratosis 01/25/2021   Hyperglycemia 02/01/2020   Crohn's disease of large intestine with abscess (HCC) 01/31/2020   Acute diverticulitis 01/09/2020   Diverticulitis of large intestine with abscess without bleeding 06/07/2017   Abscess of sigmoid colon due to diverticulitis 05/24/2017   Tobacco dependence 05/11/2017   Essential hypertension 05/11/2017   Reflux esophagitis 05/11/2017   Tinea pedis of left foot 04/11/2015   C. difficile colitis 08/26/2011   Enteritis 08/25/2011   Past Medical History:  Diagnosis Date   Acid reflux    Blood transfusion    Blood transfusion without reported diagnosis    COPD (chronic obstructive pulmonary disease) (HCC)  Diabetes mellitus without complication (HCC)    Diverticulitis    Hypertension    Sickle cell trait (HCC)     Family History  Problem Relation Age of Onset   Hypertension Mother    Diabetes Mother    Diabetes type II Mother    Diabetes type II Father    Stroke Father    Lung cancer Father 74   Esophageal cancer Maternal Grandfather    Colon cancer Neg Hx    Pancreatic cancer Neg Hx    Stomach cancer Neg Hx     Past Surgical History:  Procedure  Laterality Date   ABDOMINAL HYSTERECTOMY  2001   partial hysterectomy - emergency during last C-section   BICEPT TENODESIS Right 03/19/2024   Procedure: TENODESIS, BICEPS OPEN;  Surgeon: Addie Cordella Hamilton, MD;  Location: [facility];  Service: Orthopedics;  Laterality: Right;   CESAREAN SECTION  1991; 1997; 2001   CESAREAN SECTION     COLON SURGERY  11/04/2021   FLEXIBLE SIGMOIDOSCOPY N/A 11/04/2021   Procedure: FLEXIBLE SIGMOIDOSCOPY;  Surgeon: Teresa Lonni HERO, MD;  Location: [facility];  Service: General;  Laterality: N/A;   POSTERIOR LUMBAR FUSION 2 WITH HARDWARE REMOVAL Right 03/19/2024   Procedure: ARTHROSCOPY, SHOULDER WITH DEBRIDEMENT;  Surgeon: Addie Cordella Hamilton, MD;  Location: [facility];  Service: Orthopedics;  Laterality: Right;  right shoulder arthroscopy, debridement, mini open biceps tenodesis and rotator cuff tear repair, mua   SHOULDER OPEN ROTATOR CUFF REPAIR Right 03/19/2024   Procedure: REPAIR, ROTATOR CUFF, OPEN;  Surgeon: Addie Cordella Hamilton, MD;  Location: [facility];  Service: Orthopedics;  Laterality: Right;   SMALL INTESTINE SURGERY  11/04/2021   XI ROBOTIC ASSISTED LOWER ANTERIOR RESECTION N/A 11/04/2021   Procedure: XI ROBOTIC ASSISTED LOWER ANTERIOR RESECTION, RESECTION OF COLOVAGINAL FISTULA, BILATERAL TAP BLOCK, INJECTION OF FIREFLY;  Surgeon: Teresa Lonni HERO, MD;  Location: [facility];  Service: General;  Laterality: N/A;   Social History   Occupational History   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, nature: [employer]    Comment: [employer]  Tobacco Use   Smoking status: Some Days    Current packs/day: 0.50    Average packs/day: 0.5 packs/day for 31.0 years (15.5 ttl pk-yrs)    Types: Cigarettes   Smokeless tobacco: Never  Vaping Use   Vaping status: Never Used  Substance and Sexual Activity   Alcohol use: Yes    Alcohol/week: 1.0 standard drink of alcohol    Types: 1  Standard drinks or equivalent per week    Comment: occassionally   Drug use: Yes    Frequency: 7.0 times per week    Types: Marijuana   Sexual activity: Yes    Partners: Male    Birth control/protection: Surgical

## 2024-04-15 ENCOUNTER — Other Ambulatory Visit: Payer: Self-pay | Admitting: Surgical

## 2024-04-17 NOTE — Therapy (Signed)
 OUTPATIENT PHYSICAL THERAPY SHOULDER EVALUATION   Patient Name: Amanda Garner MRN: [id number] DOB:06-Dec-1970, 53 y.o., female Today's Date: 04/18/2024  END OF SESSION:  PT End of Session - 04/18/24 1515     Visit Number 1    Number of Visits 17    Date for PT Re-Evaluation 06/15/24    Authorization Type [employer] OTHER    PT Start Time 1100    PT Stop Time 1145    PT Time Calculation (min) 45 min    Activity Tolerance Patient tolerated treatment well    Behavior During Therapy WFL for tasks assessed/performed          Past Medical History:  Diagnosis Date   Acid reflux    Blood transfusion    Blood transfusion without reported diagnosis    COPD (chronic obstructive pulmonary disease) (HCC)    Diabetes mellitus without complication (HCC)    Diverticulitis    Hypertension    Sickle cell trait (HCC)    Past Surgical History:  Procedure Laterality Date   ABDOMINAL HYSTERECTOMY  2001   partial hysterectomy - emergency during last C-section   BICEPT TENODESIS Right 03/19/2024   Procedure: TENODESIS, BICEPS OPEN;  Surgeon: Addie Cordella Hamilton, MD;  Location: [facility];  Service: Orthopedics;  Laterality: Right;   CESAREAN SECTION  1991; 1997; 2001   CESAREAN SECTION     COLON SURGERY  11/04/2021   FLEXIBLE SIGMOIDOSCOPY N/A 11/04/2021   Procedure: FLEXIBLE SIGMOIDOSCOPY;  Surgeon: Teresa Lonni HERO, MD;  Location: [facility];  Service: General;  Laterality: N/A;   POSTERIOR LUMBAR FUSION 2 WITH HARDWARE REMOVAL Right 03/19/2024   Procedure: ARTHROSCOPY, SHOULDER WITH DEBRIDEMENT;  Surgeon: Addie Cordella Hamilton, MD;  Location: [facility];  Service: Orthopedics;  Laterality: Right;  right shoulder arthroscopy, debridement, mini open biceps tenodesis and rotator cuff tear repair, mua   SHOULDER OPEN ROTATOR CUFF REPAIR Right 03/19/2024   Procedure: REPAIR, ROTATOR CUFF, OPEN;  Surgeon: Addie Cordella Hamilton, MD;  Location: [facility];  Service: Orthopedics;  Laterality: Right;   SMALL INTESTINE SURGERY  11/04/2021   XI ROBOTIC ASSISTED LOWER ANTERIOR RESECTION N/A 11/04/2021   Procedure: XI ROBOTIC ASSISTED LOWER ANTERIOR RESECTION, RESECTION OF COLOVAGINAL FISTULA, BILATERAL TAP BLOCK, INJECTION OF FIREFLY;  Surgeon: Teresa Lonni HERO, MD;  Location: [facility];  Service: General;  Laterality: N/A;   Patient Active Problem List   Diagnosis Date Noted   Complete tear of right rotator cuff 03/24/2024   Biceps tendonitis on right 03/24/2024   Degenerative superior labral anterior-to-posterior (SLAP) tear of right shoulder 03/24/2024   Synovitis of right shoulder 03/24/2024   COPD exacerbation (HCC) 11/21/2023   Prolonged QT interval 11/21/2023   Lactic acidosis 11/21/2023   Thyroid  nodule 11/21/2023   Type 2 diabetes mellitus (HCC) 11/05/2021   GERD (gastroesophageal reflux disease) 11/05/2021   S/P laparoscopic-assisted sigmoidectomy 11/04/2021   Abnormal CT scan 08/05/2021   Hx of diverticulitis of colon 08/05/2021   Colovaginal fistula 07/03/2021   Acquired palmar and plantar hyperkeratosis 01/25/2021   Hyperglycemia 02/01/2020   Crohn's disease of large intestine with abscess (HCC) 01/31/2020   Acute diverticulitis 01/09/2020   Diverticulitis of large intestine with abscess without bleeding 06/07/2017   Abscess of sigmoid colon due to diverticulitis 05/24/2017   Tobacco dependence 05/11/2017   Essential hypertension 05/11/2017   Reflux esophagitis 05/11/2017   Tinea pedis of left foot 04/11/2015   C. difficile colitis 08/26/2011   Enteritis 08/25/2011  PCP: Delbert Clam, MD   REFERRING PROVIDER: Shirly Carlin CROME, PA-C   REFERRING DIAG: [phone] (ICD-10-CM) - S/P right rotator cuff repair   THERAPY DIAG:  Acute pain of right shoulder  Muscle weakness (generalized)  Stiffness of right shoulder, not elsewhere classified  Rationale for Evaluation and Treatment: Rehabilitation  ONSET  DATE: 03/19/24  SUBJECTIVE:                                                                                                                                                                                      SUBJECTIVE STATEMENT: Pt reports her R shoulder is sore, but is improving since surgery. Pt notes the sling has been Dced, but to can wear in public as needed. Initially injured fer R shoulder during a fall backwards.  Hand dominance: Right  PERTINENT HISTORY: See above, COPD, DM2  PAIN:  Are you having pain? Yes: NPRS scale: 8/10 Pain location: anterior R shoulder Pain description: throb, ache, sharp Aggravating factors: When pain meds wear off Relieving factors: Rest  PRECAUTIONS: Shoulder no lifting  RED FLAGS: None   WEIGHT BEARING RESTRICTIONS: Yes no pulling or pushing  FALLS:  Has patient fallen in last 6 months? 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  PATIENT SURVEYS:  Quick Dash: 84%  Minimally Clinically Important Difference (MCID): 15-20 points  COGNITION: Overall cognitive status: Within functional limits for tasks assessed     SENSATION: WFL  POSTURE: Forward head and rounded shoulders  UPPER EXTREMITY ROM:   Passive ROM Right eval Left eval  Shoulder flexion 90   Shoulder extension    Shoulder abduction 75   Shoulder adduction    Shoulder internal rotation    Shoulder external rotation 40   Elbow flexion    Elbow extension    Wrist flexion    Wrist extension    Wrist ulnar deviation    Wrist radial deviation    Wrist pronation    Wrist supination    (Blank rows = not tested)  UPPER EXTREMITY MMT:  NT MMT  Right eval Left eval  Shoulder flexion    Shoulder extension    Shoulder abduction    Shoulder adduction    Shoulder internal rotation    Shoulder external rotation    Middle trapezius    Lower trapezius    Elbow flexion  Elbow extension    Wrist flexion    Wrist extension    Wrist ulnar deviation    Wrist radial deviation    Wrist pronation    Wrist supination    Grip strength (lbs)    (Blank rows = not tested)  SHOULDER SPECIAL TESTS: NT  JOINT MOBILITY TESTING:  WNLs  PALPATION:  TTP to the L peri-GH area                                                                                                                       TREATMENT DATE:  [facility] Adult PT Treatment:                                                DATE: 04/18/24 Therapeutic Exercise: Developed, instructed in, and pt completed therex as noted in HEP  Self Care: Use of Iceman 20 to 30 mins after exs and as needed for swelling and pain management  Sleep hygiene for positioning and support  PATIENT EDUCATION: Education details: Eval findings, POC, HEP, self care  Person educated: Patient Education method: Explanation, Demonstration, Tactile cues, Verbal cues, and Handouts Education comprehension: verbalized understanding, returned demonstration, verbal cues required, and tactile cues required  HOME EXERCISE PROGRAM: Access Code: [email or web address] URL: [email or web address] Date: 04/18/2024 Prepared by: Dasie Daft  Exercises - Seated Scapular Retraction  - 3 x daily - 7 x weekly - 1 sets - 10 reps - 5 hold - Seated Shoulder Flexion Towel Slide at Table Top  - 3 x daily - 7 x weekly - 1 sets - 10 reps - 5 hold - Seated Shoulder Scaption Slide at Table Top with Forearm in Neutral (Mirrored)  - 3 x daily - 7 x weekly - 1 sets - 10 reps - 5 hold - Seated Shoulder External Rotation PROM on Table (Mirrored)  - 3 x daily - 7 x weekly - 1 sets - 10 reps - 5 hold  ASSESSMENT:  CLINICAL  IMPRESSION: Patient is a 53 y.o. female who was seen today for physical therapy evaluation and treatment for Z98.890 (ICD-10-CM) - S/P right rotator cuff repair .   OBJECTIVE IMPAIRMENTS: decreased activity tolerance, decreased ROM, decreased strength, impaired UE functional use, and pain.   ACTIVITY LIMITATIONS: carrying, lifting, bathing, toileting, dressing, reach over head, and caring for others  PARTICIPATION LIMITATIONS: meal prep, cleaning, laundry, driving, shopping, community activity, and occupation  PERSONAL FACTORS: Past/current experiences, Time since onset of injury/illness/exacerbation, and 1 comorbidity: DM2 are also affecting patient's functional outcome.   REHAB POTENTIAL: Good  CLINICAL DECISION MAKING: Evolving/moderate complexity  EVALUATION COMPLEXITY: Moderate   GOALS:  SHORT TERM GOALS: Target date: 05/13/24  Pt will be Ind in an initial HEP  Baseline: started Goal status: INITIAL  2.  Increase R shoulder PROM for flexion to 120d and ER to 65d for appropriate progress  with her R shoulder RC rehab  Baseline: 90 and 40 respectively Goal status: INITIAL  LONG TERM GOALS: Target date: 06/15/24  Pt will be Ind in a final HEP to maintain achieved LOF Baseline:  Goal status: INITIAL  2.  Pt will demonstrate AROM for the R shoulder to flex=130, abd=120, ER to T1 and IR to L4 for needed functional ROM for daily activities Baseline:  Goal status: INITIAL  3.  Pt will report 50% or greater improvement in her r shoulder pain for improved function and QOL Baseline: 8/10 Goal status: INITIAL  5.  Pt's Quick Dash score will improve to 69% or better as indication of improved function  Baseline: 84% Goal status: INITIAL  6.  PT will developed advanced R shoulder LTGs as appropriate for her recovery Baseline:  Goal status: INITIAL  PLAN:  PT FREQUENCY: 2x/week  PT DURATION: 8 weeks  PLANNED INTERVENTIONS: 97164- PT Re-evaluation, 97110-Therapeutic  exercises, 97530- Therapeutic activity, [id number]- Neuromuscular re-education, 97535- Self Care, [id number]- Manual therapy, [id number]- Electrical stimulation (unattended), 97016- Vasopneumatic device, 20560 (1-2 muscles), 20561 (3+ muscles)- Dry Needling, Patient/Family education, Taping, Joint mobilization, Cryotherapy, and Moist heat  PLAN FOR NEXT SESSION: Assess response to HEP; progress therex as indicated; use of modalities, manual therapy; and TPDN as indicated.  Avel Ogawa MS, PT 04/18/24 6:15 PM

## 2024-04-18 ENCOUNTER — Other Ambulatory Visit: Payer: Self-pay

## 2024-04-18 ENCOUNTER — Ambulatory Visit: Attending: Surgical

## 2024-04-18 DIAGNOSIS — M25611 Stiffness of right shoulder, not elsewhere classified: Secondary | ICD-10-CM | POA: Diagnosis present

## 2024-04-18 DIAGNOSIS — M25511 Pain in right shoulder: Secondary | ICD-10-CM | POA: Diagnosis present

## 2024-04-18 DIAGNOSIS — M6281 Muscle weakness (generalized): Secondary | ICD-10-CM | POA: Diagnosis present

## 2024-04-18 DIAGNOSIS — Z9889 Other specified postprocedural states: Secondary | ICD-10-CM | POA: Insufficient documentation

## 2024-04-24 NOTE — Therapy (Signed)
 OUTPATIENT PHYSICAL THERAPY SHOULDER EVALUATION   Patient Name: Amanda Garner MRN: [id number] DOB:09-16-1970, 53 y.o., female Today's Date: 04/26/2024  END OF SESSION:  PT End of Session - 04/26/24 0553     Visit Number 2    Number of Visits 17    Date for PT Re-Evaluation 06/15/24    Authorization Type [employer] OTHER    Authorization - Visit Number 1    Authorization - Number of Visits 60    PT Start Time 1105    PT Stop Time 1148    PT Time Calculation (min) 43 min    Activity Tolerance Patient tolerated treatment well    Behavior During Therapy WFL for tasks assessed/performed           Past Medical History:  Diagnosis Date   Acid reflux    Blood transfusion    Blood transfusion without reported diagnosis    COPD (chronic obstructive pulmonary disease) (HCC)    Diabetes mellitus without complication (HCC)    Diverticulitis    Hypertension    Sickle cell trait (HCC)    Past Surgical History:  Procedure Laterality Date   ABDOMINAL HYSTERECTOMY  2001   partial hysterectomy - emergency during last C-section   BICEPT TENODESIS Right 03/19/2024   Procedure: TENODESIS, BICEPS OPEN;  Surgeon: Addie Cordella Hamilton, MD;  Location: [facility];  Service: Orthopedics;  Laterality: Right;   CESAREAN SECTION  1991; 1997; 2001   CESAREAN SECTION     COLON SURGERY  11/04/2021   FLEXIBLE SIGMOIDOSCOPY N/A 11/04/2021   Procedure: FLEXIBLE SIGMOIDOSCOPY;  Surgeon: Teresa Lonni HERO, MD;  Location: [facility];  Service: General;  Laterality: N/A;   POSTERIOR LUMBAR FUSION 2 WITH HARDWARE REMOVAL Right 03/19/2024   Procedure: ARTHROSCOPY, SHOULDER WITH DEBRIDEMENT;  Surgeon: Addie Cordella Hamilton, MD;  Location: [facility];  Service: Orthopedics;  Laterality: Right;  right shoulder arthroscopy, debridement, mini open biceps tenodesis and rotator cuff tear repair, mua   SHOULDER OPEN ROTATOR CUFF REPAIR Right 03/19/2024   Procedure: REPAIR, ROTATOR  CUFF, OPEN;  Surgeon: Addie Cordella Hamilton, MD;  Location: [facility];  Service: Orthopedics;  Laterality: Right;   SMALL INTESTINE SURGERY  11/04/2021   XI ROBOTIC ASSISTED LOWER ANTERIOR RESECTION N/A 11/04/2021   Procedure: XI ROBOTIC ASSISTED LOWER ANTERIOR RESECTION, RESECTION OF COLOVAGINAL FISTULA, BILATERAL TAP BLOCK, INJECTION OF FIREFLY;  Surgeon: Teresa Lonni HERO, MD;  Location: [facility];  Service: General;  Laterality: N/A;   Patient Active Problem List   Diagnosis Date Noted   Complete tear of right rotator cuff 03/24/2024   Biceps tendonitis on right 03/24/2024   Degenerative superior labral anterior-to-posterior (SLAP) tear of right shoulder 03/24/2024   Synovitis of right shoulder 03/24/2024   COPD exacerbation (HCC) 11/21/2023   Prolonged QT interval 11/21/2023   Lactic acidosis 11/21/2023   Thyroid  nodule 11/21/2023   Type 2 diabetes mellitus (HCC) 11/05/2021   GERD (gastroesophageal reflux disease) 11/05/2021   S/P laparoscopic-assisted sigmoidectomy 11/04/2021   Abnormal CT scan 08/05/2021   Hx of diverticulitis of colon 08/05/2021   Colovaginal fistula 07/03/2021   Acquired palmar and plantar hyperkeratosis 01/25/2021   Hyperglycemia 02/01/2020   Crohn's disease of large intestine with abscess (HCC) 01/31/2020   Acute diverticulitis 01/09/2020   Diverticulitis of large intestine with abscess without bleeding 06/07/2017   Abscess of sigmoid colon due to diverticulitis 05/24/2017   Tobacco dependence 05/11/2017   Essential hypertension 05/11/2017   Reflux esophagitis 05/11/2017  Tinea pedis of left foot 04/11/2015   C. difficile colitis 08/26/2011   Enteritis 08/25/2011    PCP: Delbert Clam, MD   REFERRING PROVIDER: Shirly Carlin CROME, PA-C   REFERRING DIAG: [phone] (ICD-10-CM) - S/P right rotator cuff repair   THERAPY DIAG:  Acute pain of right shoulder  Muscle weakness (generalized)  Stiffness of right shoulder, not elsewhere  classified  Rationale for Evaluation and Treatment: Rehabilitation  ONSET DATE: 03/19/24  SUBJECTIVE:                                                                                                                                                                                      SUBJECTIVE STATEMENT: Pt reports her R shoulder is feeling better. Pt notes she has decreased the amount of pain medication she is taking. Pt notes that using the ice machine increases her shoulder pain.  EVAL: Pt reports her R shoulder is sore, but is improving since surgery. Pt notes the sling has been Dced, but to can wear in public as needed. Initially injured fer R shoulder during a fall backwards.  Hand dominance: Right  PERTINENT HISTORY: See above, COPD, DM2  PAIN:  Are you having pain? Yes: NPRS scale: 8/10 Pain location: anterior R shoulder Pain description: throb, ache, sharp Aggravating factors: When pain meds wear off Relieving factors: Rest  PRECAUTIONS: Shoulder no lifting  RED FLAGS: None   WEIGHT BEARING RESTRICTIONS: Yes no pulling or pushing  FALLS:  Has patient fallen in last 6 months? 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  PATIENT SURVEYS:  Quick Dash: 84%  Minimally Clinically Important Difference (MCID): 15-20 points  COGNITION: Overall cognitive status: Within functional limits for tasks assessed     SENSATION: WFL  POSTURE: Forward head and rounded shoulders  UPPER EXTREMITY ROM:   Passive ROM Right eval Left eval Rt  Shoulder flexion 90  110d  Shoulder extension     Shoulder abduction 75    Shoulder adduction     Shoulder internal  rotation     Shoulder external rotation 40  45d  Elbow flexion     Elbow extension     Wrist flexion     Wrist extension     Wrist ulnar deviation     Wrist radial deviation     Wrist pronation     Wrist supination     (  Blank rows = not tested)  UPPER EXTREMITY MMT:  NT MMT Right eval Left eval  Shoulder flexion    Shoulder extension    Shoulder abduction    Shoulder adduction    Shoulder internal rotation    Shoulder external rotation    Middle trapezius    Lower trapezius    Elbow flexion    Elbow extension    Wrist flexion    Wrist extension    Wrist ulnar deviation    Wrist radial deviation    Wrist pronation    Wrist supination    Grip strength (lbs)    (Blank rows = not tested)  SHOULDER SPECIAL TESTS: NT  JOINT MOBILITY TESTING:  WNLs  PALPATION:  TTP to the L peri-GH area                                                                                                                       TREATMENT DATE:  [facility] Adult PT Treatment:            5 weeks s/p Sx                  DATE: 04/25/24 Therapeutic Exercise: Seated Scapular Retraction 10 reps 5 hold Seated Shoulder Flexion Towel Slide at Table Top 10 reps - 5 hold Seated Shoulder Scaption Slide at Table Top 10 reps - 5 hold Seated Shoulder External Rotation PROM on Table 10 reps - 5 hold Standing pendulum exs, started but stopped when pt unable to relax arm Supine shoulder flexion c L hand assist x10 5 Supine shoulder ER c dowel x10 5 Isometrics shoulder flexion and extension x5 5  Manual Therapy: PROM for shoulder flexion, ER, and abd  [facility] Adult PT Treatment:                                                DATE: 04/18/24 Therapeutic Exercise: Developed, instructed in, and pt completed therex as noted in HEP  Self Care: Use of Iceman 20 to 30 mins after exs and as needed for swelling and pain management  Sleep hygiene for positioning and support  PATIENT EDUCATION: Education details: Eval  findings, POC, HEP, self care  Person educated: Patient Education method: Explanation, Demonstration, Tactile cues, Verbal cues, and Handouts Education comprehension: verbalized understanding, returned demonstration, verbal cues required, and tactile cues required  HOME EXERCISE PROGRAM: Access Code: [email or web address] URL: [email or web address] Date: 04/25/2024 Prepared by: Dasie Daft  Exercises - Seated Scapular Retraction  - 2 x daily - 7 x weekly - 1 sets - 10 reps - 5 hold - Seated Shoulder Flexion Towel Slide at Table Top  - 2 x daily - 7 x weekly - 1 sets - 10 reps - 5 hold - Seated Shoulder Scaption Slide at Table Top with Forearm in Neutral (Mirrored)  - 2 x daily -  7 x weekly - 1 sets - 10 reps - 5 hold - Seated Shoulder External Rotation PROM on Table (Mirrored)  - 2 x daily - 7 x weekly - 1 sets - 10 reps - 5 hold - Supine Shoulder Flexion AAROM with Hands Clasped  - 2 x daily - 7 x weekly - 1 sets - 10 reps - 5 hold - Supine Shoulder External Rotation in 45 Degrees Abduction AAROM with Dowel  - 2 x daily - 7 x weekly - 1 sets - 10 reps - 5 hold - Isometric Shoulder Flexion at Wall  - 2 x daily - 7 x weekly - 1 sets - 5 reps - 5 hold - Isometric Shoulder Extension at Wall  - 1 x daily - 7 x weekly - 1 sets - 5 reps - 5 hold  ASSESSMENT:  CLINICAL IMPRESSION: PT was completed for passive and AA ROMs exs for the R shoulder as well as active scapular strengthening and isometric strengthening for shoulder flexion and extension. Pt tolerated prescribed exs in PT today without adverse effects. AAROM for flexion and ER have both improved. Pt had difficulty relaxing R arm for pendulum exs so they were stopped. Pt reports good reduction in pain since the initial eval. She finds cold packs increase there R shoulder pain. Pt will continue to benefit from skilled PT to address impairments for improved R shoulder function c minimized pain.   OBJECTIVE IMPAIRMENTS: decreased activity  tolerance, decreased ROM, decreased strength, impaired UE functional use, and pain.   ACTIVITY LIMITATIONS: carrying, lifting, bathing, toileting, dressing, reach over head, and caring for others  PARTICIPATION LIMITATIONS: meal prep, cleaning, laundry, driving, shopping, community activity, and occupation  PERSONAL FACTORS: Past/current experiences, Time since onset of injury/illness/exacerbation, and 1 comorbidity: DM2 are also affecting patient's functional outcome.   REHAB POTENTIAL: Good  CLINICAL DECISION MAKING: Evolving/moderate complexity  EVALUATION COMPLEXITY: Moderate   GOALS:  SHORT TERM GOALS: Target date: 05/13/24  Pt will be Ind in an initial HEP  Baseline: started Goal status: ONGOING  2.  Increase R shoulder PROM for flexion to 120d and ER to 65d for appropriate progress with her R shoulder RC rehab  Baseline: 90 and 40 respectively Goal status: ONGOING  LONG TERM GOALS: Target date: 06/15/24  Pt will be Ind in a final HEP to maintain achieved LOF Baseline:  Goal status: INITIAL  2.  Pt will demonstrate AROM for the R shoulder to flex=130, abd=120, ER to T1 and IR to L4 for needed functional ROM for daily activities Baseline:  Goal status: INITIAL  3.  Pt will report 50% or greater improvement in her r shoulder pain for improved function and QOL Baseline: 8/10 Goal status: INITIAL  5.  Pt's Quick Dash score will improve to 69% or better as indication of improved function  Baseline: 84% Goal status: INITIAL  6.  PT will developed advanced R shoulder LTGs as appropriate for her recovery Baseline:  Goal status: INITIAL  PLAN:  PT FREQUENCY: 2x/week  PT DURATION: 8 weeks  PLANNED INTERVENTIONS: 97164- PT Re-evaluation, 97110-Therapeutic exercises, 97530- Therapeutic activity, [id number]- Neuromuscular re-education, 97535- Self Care, 02859- Manual therapy, G0283- Electrical stimulation (unattended), 97016- Vasopneumatic device, 20560 (1-2 muscles), 20561  (3+ muscles)- Dry Needling, Patient/Family education, Taping, Joint mobilization, Cryotherapy, and Moist heat  PLAN FOR NEXT SESSION: Assess response to HEP; progress therex as indicated; use of modalities, manual therapy; and TPDN as indicated.  Katrell Milhorn MS, PT 04/26/24 5:56 AM

## 2024-04-25 ENCOUNTER — Ambulatory Visit: Attending: Surgical

## 2024-04-25 DIAGNOSIS — M6281 Muscle weakness (generalized): Secondary | ICD-10-CM | POA: Diagnosis present

## 2024-04-25 DIAGNOSIS — M25511 Pain in right shoulder: Secondary | ICD-10-CM | POA: Insufficient documentation

## 2024-04-25 DIAGNOSIS — M25611 Stiffness of right shoulder, not elsewhere classified: Secondary | ICD-10-CM | POA: Insufficient documentation

## 2024-04-27 ENCOUNTER — Encounter: Payer: Self-pay | Admitting: Physical Therapy

## 2024-04-27 ENCOUNTER — Ambulatory Visit: Admitting: Physical Therapy

## 2024-04-27 DIAGNOSIS — M25511 Pain in right shoulder: Secondary | ICD-10-CM

## 2024-04-27 DIAGNOSIS — M6281 Muscle weakness (generalized): Secondary | ICD-10-CM

## 2024-04-27 DIAGNOSIS — M25611 Stiffness of right shoulder, not elsewhere classified: Secondary | ICD-10-CM

## 2024-04-27 NOTE — Therapy (Signed)
 OUTPATIENT PHYSICAL THERAPY SHOULDER TREATMENT   Patient Name: Amanda Garner MRN: [id number] DOB:[birth year], 53 y.o., female Today's Date: 04/27/2024  END OF SESSION:  PT End of Session - 04/27/24 0820     Visit Number 3    Number of Visits 17    Date for PT Re-Evaluation 06/15/24    Authorization Type [employer] OTHER    Authorization - Visit Number 2    Authorization - Number of Visits 60    PT Start Time 0805    PT Stop Time 0845    PT Time Calculation (min) 40 min            Past Medical History:  Diagnosis Date   Acid reflux    Blood transfusion    Blood transfusion without reported diagnosis    COPD (chronic obstructive pulmonary disease) (HCC)    Diabetes mellitus without complication (HCC)    Diverticulitis    Hypertension    Sickle cell trait (HCC)    Past Surgical History:  Procedure Laterality Date   ABDOMINAL HYSTERECTOMY  2001   partial hysterectomy - emergency during last C-section   BICEPT TENODESIS Right 03/19/2024   Procedure: TENODESIS, BICEPS OPEN;  Surgeon: Addie Cordella Hamilton, MD;  Location: [facility];  Service: Orthopedics;  Laterality: Right;   CESAREAN SECTION  1991; 1997; 2001   CESAREAN SECTION     COLON SURGERY  11/04/2021   FLEXIBLE SIGMOIDOSCOPY N/A 11/04/2021   Procedure: FLEXIBLE SIGMOIDOSCOPY;  Surgeon: Teresa Lonni HERO, MD;  Location: [facility];  Service: General;  Laterality: N/A;   POSTERIOR LUMBAR FUSION 2 WITH HARDWARE REMOVAL Right 03/19/2024   Procedure: ARTHROSCOPY, SHOULDER WITH DEBRIDEMENT;  Surgeon: Addie Cordella Hamilton, MD;  Location:  [facility];  Service: Orthopedics;  Laterality: Right;  right shoulder arthroscopy, debridement, mini open biceps tenodesis and rotator cuff tear repair, mua   SHOULDER OPEN ROTATOR CUFF REPAIR Right 03/19/2024   Procedure: REPAIR, ROTATOR CUFF, OPEN;  Surgeon: Addie Cordella Hamilton, MD;  Location:  [facility];  Service: Orthopedics;  Laterality:  Right;   SMALL INTESTINE SURGERY  11/04/2021   XI ROBOTIC ASSISTED LOWER ANTERIOR RESECTION N/A 11/04/2021   Procedure: XI ROBOTIC ASSISTED LOWER ANTERIOR RESECTION, RESECTION OF COLOVAGINAL FISTULA, BILATERAL TAP BLOCK, INJECTION OF FIREFLY;  Surgeon: Teresa Lonni HERO, MD;  Location: [facility];  Service: General;  Laterality: N/A;   Patient Active Problem List   Diagnosis Date Noted   Complete tear of right rotator cuff 03/24/2024   Biceps tendonitis on right 03/24/2024   Degenerative superior labral anterior-to-posterior (SLAP) tear of right shoulder 03/24/2024   Synovitis of right shoulder 03/24/2024   COPD exacerbation (HCC) 11/21/2023   Prolonged QT interval 11/21/2023   Lactic acidosis 11/21/2023   Thyroid  nodule 11/21/2023   Type 2 diabetes mellitus (HCC) 11/05/2021   GERD (gastroesophageal reflux disease) 11/05/2021   S/P laparoscopic-assisted sigmoidectomy 11/04/2021   Abnormal CT scan 08/05/2021   Hx of diverticulitis of colon 08/05/2021   Colovaginal fistula 07/03/2021   Acquired palmar and plantar hyperkeratosis 01/25/2021   Hyperglycemia 02/01/2020   Crohn's disease of large intestine with abscess (HCC) 01/31/2020   Acute diverticulitis 01/09/2020   Diverticulitis of large intestine with abscess without bleeding 06/07/2017   Abscess of sigmoid colon due to diverticulitis 05/24/2017   Tobacco dependence 05/11/2017   Essential hypertension 05/11/2017   Reflux esophagitis 05/11/2017   Tinea pedis of left foot 04/11/2015   C. difficile colitis 08/26/2011   Enteritis 08/25/2011  PCP: Delbert Clam, MD   REFERRING PROVIDER: Shirly Carlin CROME, PA-C   REFERRING DIAG: [phone] (ICD-10-CM) - S/P right rotator cuff repair   THERAPY DIAG:  Acute pain of right shoulder  Muscle weakness (generalized)  Stiffness of right shoulder, not elsewhere classified  Rationale for Evaluation and Treatment: Rehabilitation  ONSET DATE: 03/19/24  SUBJECTIVE:                                                                                                                                                                                       SUBJECTIVE STATEMENT: Pt reports her R shoulder is feeling a little better 7/10.   EVAL: Pt reports her R shoulder is sore, but is improving since surgery. Pt notes the sling has been Dced, but to can wear in public as needed. Initially injured fer R shoulder during a fall backwards.  Hand dominance: Right  PERTINENT HISTORY: See above, COPD, DM2  PAIN:  Are you having pain? Yes: NPRS scale: 7/10 Pain location: anterior R shoulder Pain description: throb, ache, sharp Aggravating factors: When pain meds wear off Relieving factors: Rest  PRECAUTIONS: Shoulder no lifting  RED FLAGS: None   WEIGHT BEARING RESTRICTIONS: Yes no pulling or pushing  FALLS:  Has patient fallen in last 6 months? 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  PATIENT SURVEYS:  Quick Dash: 84%  Minimally Clinically Important Difference (MCID): 15-20 points  COGNITION: Overall cognitive status: Within functional limits for tasks assessed     SENSATION: WFL  POSTURE: Forward head and rounded shoulders  UPPER EXTREMITY ROM:   Passive ROM Right eval Left eval Rt  Shoulder flexion 90  110d  Shoulder extension     Shoulder abduction 75    Shoulder adduction     Shoulder internal rotation     Shoulder external rotation 40  45d  Elbow flexion     Elbow extension     Wrist flexion     Wrist extension     Wrist ulnar deviation     Wrist radial deviation     Wrist pronation     Wrist supination     (Blank rows = not  tested)  UPPER EXTREMITY MMT:  NT MMT Right eval Left eval  Shoulder flexion    Shoulder extension    Shoulder abduction    Shoulder adduction  Shoulder internal rotation    Shoulder external rotation    Middle trapezius    Lower trapezius    Elbow flexion    Elbow extension    Wrist flexion    Wrist extension    Wrist ulnar deviation    Wrist radial deviation    Wrist pronation    Wrist supination    Grip strength (lbs)    (Blank rows = not tested)  SHOULDER SPECIAL TESTS: NT  JOINT MOBILITY TESTING:  WNLs  PALPATION:  TTP to the L peri-[facility] area                                                                                                                       TREATMENT DATE:  [facility] Adult PT Treatment:                                                DATE: 04/27/24 Therapeutic Exercise: Pendulum Standing scap retract  Shoulder table flexion using foam roller  Shoulder flexion table slides  Shoulder scaption table slides Shoulder flex , ext  and ER iso 5 sec x 10 each Manual Therapy: PROM for shoulder flexion, ER, and abd  Self Care: Avoidance of shoulder hike, no lifting    [facility] Adult PT Treatment:            5 weeks s/p Sx                  DATE: 04/25/24 Therapeutic Exercise: Seated Scapular Retraction 10 reps 5 hold Seated Shoulder Flexion Towel Slide at Table Top 10 reps - 5 hold Seated Shoulder Scaption Slide at Table Top 10 reps - 5 hold Seated Shoulder External Rotation PROM on Table 10 reps - 5 hold Standing pendulum exs, started but stopped when pt unable to relax arm Supine shoulder flexion c L hand assist x10 5 Supine shoulder ER c dowel x10 5 Isometrics shoulder flexion and extension x5 5  Manual Therapy: PROM for shoulder flexion, ER, and abd  [facility] Adult PT Treatment:                                                DATE: 04/18/24 Therapeutic Exercise: Developed, instructed in, and pt completed therex as noted in HEP  Self Care: Use of Iceman  20 to 30 mins after exs and as needed for swelling and pain management  Sleep hygiene for positioning and support  PATIENT EDUCATION: Education details: Eval findings, POC, HEP, self care  Person educated: Patient Education method: Explanation, Demonstration, Tactile cues, Verbal cues, and Handouts Education comprehension: verbalized understanding, returned demonstration, verbal cues required, and tactile cues required  HOME EXERCISE PROGRAM: Access Code: [email or web address] URL: [email or web address] Date: 04/25/2024 Prepared  by: Dasie Daft  Exercises - Seated Scapular Retraction  - 2 x daily - 7 x weekly - 1 sets - 10 reps - 5 hold - Seated Shoulder Flexion Towel Slide at Table Top  - 2 x daily - 7 x weekly - 1 sets - 10 reps - 5 hold - Seated Shoulder Scaption Slide at Table Top with Forearm in Neutral (Mirrored)  - 2 x daily - 7 x weekly - 1 sets - 10 reps - 5 hold - Seated Shoulder External Rotation PROM on Table (Mirrored)  - 2 x daily - 7 x weekly - 1 sets - 10 reps - 5 hold - Supine Shoulder Flexion AAROM with Hands Clasped  - 2 x daily - 7 x weekly - 1 sets - 10 reps - 5 hold - Supine Shoulder External Rotation in 45 Degrees Abduction AAROM with Dowel  - 2 x daily - 7 x weekly - 1 sets - 10 reps - 5 hold - Isometric Shoulder Flexion at Wall  - 2 x daily - 7 x weekly - 1 sets - 5 reps - 5 hold - Isometric Shoulder Extension at Wall  - 1 x daily - 7 x weekly - 1 sets - 5 reps - 5 hold  ASSESSMENT:  CLINICAL IMPRESSION: PT was completed for passive and AA ROMs exs for the R shoulder as well as active scapular strengthening and isometric strengthening for shoulder flexion and extension. Pt tolerated prescribed exs in PT today without adverse effects. She requires frequent cues to avoid shoulder hike. Discussed continued precautions due to post op status. Pt reported fatigue and heaviness , not increased pain after therex. Pt will continue to benefit from skilled PT to address  impairments for improved R shoulder function c minimized pain.   OBJECTIVE IMPAIRMENTS: decreased activity tolerance, decreased ROM, decreased strength, impaired UE functional use, and pain.   ACTIVITY LIMITATIONS: carrying, lifting, bathing, toileting, dressing, reach over head, and caring for others  PARTICIPATION LIMITATIONS: meal prep, cleaning, laundry, driving, shopping, community activity, and occupation  PERSONAL FACTORS: Past/current experiences, Time since onset of injury/illness/exacerbation, and 1 comorbidity: DM2 are also affecting patient's functional outcome.   REHAB POTENTIAL: Good  CLINICAL DECISION MAKING: Evolving/moderate complexity  EVALUATION COMPLEXITY: Moderate   GOALS:  SHORT TERM GOALS: Target date: 05/13/24  Pt will be Ind in an initial HEP  Baseline: started Goal status: ONGOING  2.  Increase R shoulder PROM for flexion to 120d and ER to 65d for appropriate progress with her R shoulder RC rehab  Baseline: 90 and 40 respectively Goal status: ONGOING  LONG TERM GOALS: Target date: 06/15/24  Pt will be Ind in a final HEP to maintain achieved LOF Baseline:  Goal status: INITIAL  2.  Pt will demonstrate AROM for the R shoulder to flex=130, abd=120, ER to T1 and IR to L4 for needed functional ROM for daily activities Baseline:  Goal status: INITIAL  3.  Pt will report 50% or greater improvement in her r shoulder pain for improved function and QOL Baseline: 8/10 Goal status: INITIAL  5.  Pt's Quick Dash score will improve to 69% or better as indication of improved function  Baseline: 84% Goal status: INITIAL  6.  PT will developed advanced R shoulder LTGs as appropriate for her recovery Baseline:  Goal status: INITIAL  PLAN:  PT FREQUENCY: 2x/week  PT DURATION: 8 weeks  PLANNED INTERVENTIONS: 97164- PT Re-evaluation, 97110-Therapeutic exercises, 97530- Therapeutic activity, [id number]- Neuromuscular re-education, 97535- Self Care, 02859-  Manual  therapy, G0283- Electrical stimulation (unattended), 97016- Vasopneumatic device, 20560 (1-2 muscles), 20561 (3+ muscles)- Dry Needling, Patient/Family education, Taping, Joint mobilization, Cryotherapy, and Moist heat  PLAN FOR NEXT SESSION: Assess response to HEP; progress therex as indicated; use of modalities, manual therapy; and TPDN as indicated.  Harlene Persons, PTA 04/27/24 9:34 AM Phone: [phone] Fax: [phone]

## 2024-04-30 NOTE — Therapy (Signed)
 OUTPATIENT PHYSICAL THERAPY SHOULDER TREATMENT   Patient Name: Amanda Garner MRN: [id number] DOB:03-31-1971, 53 y.o., female Today's Date: 05/01/2024  END OF SESSION:  PT End of Session - 05/01/24 0936     Visit Number 4    Number of Visits 17    Date for PT Re-Evaluation 06/15/24    Authorization Type [employer] OTHER    Authorization - Visit Number 3    Authorization - Number of Visits 60    PT Start Time 0935    PT Stop Time 1020    PT Time Calculation (min) 45 min    Activity Tolerance Patient tolerated treatment well    Behavior During Therapy WFL for tasks assessed/performed             Past Medical History:  Diagnosis Date   Acid reflux    Blood transfusion    Blood transfusion without reported diagnosis    COPD (chronic obstructive pulmonary disease) (HCC)    Diabetes mellitus without complication (HCC)    Diverticulitis    Hypertension    Sickle cell trait (HCC)    Past Surgical History:  Procedure Laterality Date   ABDOMINAL HYSTERECTOMY  2001   partial hysterectomy - emergency during last C-section   BICEPT TENODESIS Right 03/19/2024   Procedure: TENODESIS, BICEPS OPEN;  Surgeon: Addie Cordella Hamilton, MD;  Location: [facility];  Service: Orthopedics;  Laterality: Right;   CESAREAN SECTION  1991; 1997; 2001   CESAREAN SECTION     COLON SURGERY  11/04/2021   FLEXIBLE SIGMOIDOSCOPY N/A 11/04/2021   Procedure: FLEXIBLE SIGMOIDOSCOPY;  Surgeon: Teresa Lonni HERO, MD;  Location: [facility];  Service: General;  Laterality: N/A;   POSTERIOR LUMBAR FUSION 2 WITH HARDWARE REMOVAL Right 03/19/2024   Procedure: ARTHROSCOPY, SHOULDER WITH DEBRIDEMENT;  Surgeon: Addie Cordella Hamilton, MD;  Location: [facility];  Service: Orthopedics;  Laterality: Right;  right shoulder arthroscopy, debridement, mini open biceps tenodesis and rotator cuff tear repair, mua   SHOULDER OPEN ROTATOR CUFF REPAIR Right 03/19/2024   Procedure: REPAIR, ROTATOR  CUFF, OPEN;  Surgeon: Addie Cordella Hamilton, MD;  Location: [facility];  Service: Orthopedics;  Laterality: Right;   SMALL INTESTINE SURGERY  11/04/2021   XI ROBOTIC ASSISTED LOWER ANTERIOR RESECTION N/A 11/04/2021   Procedure: XI ROBOTIC ASSISTED LOWER ANTERIOR RESECTION, RESECTION OF COLOVAGINAL FISTULA, BILATERAL TAP BLOCK, INJECTION OF FIREFLY;  Surgeon: Teresa Lonni HERO, MD;  Location: [facility];  Service: General;  Laterality: N/A;   Patient Active Problem List   Diagnosis Date Noted   Complete tear of right rotator cuff 03/24/2024   Biceps tendonitis on right 03/24/2024   Degenerative superior labral anterior-to-posterior (SLAP) tear of right shoulder 03/24/2024   Synovitis of right shoulder 03/24/2024   COPD exacerbation (HCC) 11/21/2023   Prolonged QT interval 11/21/2023   Lactic acidosis 11/21/2023   Thyroid  nodule 11/21/2023   Type 2 diabetes mellitus (HCC) 11/05/2021   GERD (gastroesophageal reflux disease) 11/05/2021   S/P laparoscopic-assisted sigmoidectomy 11/04/2021   Abnormal CT scan 08/05/2021   Hx of diverticulitis of colon 08/05/2021   Colovaginal fistula 07/03/2021   Acquired palmar and plantar hyperkeratosis 01/25/2021   Hyperglycemia 02/01/2020   Crohn's disease of large intestine with abscess (HCC) 01/31/2020   Acute diverticulitis 01/09/2020   Diverticulitis of large intestine with abscess without bleeding 06/07/2017   Abscess of sigmoid colon due to diverticulitis 05/24/2017   Tobacco dependence 05/11/2017   Essential hypertension 05/11/2017   Reflux esophagitis 05/11/2017  Tinea pedis of left foot 04/11/2015   C. difficile colitis 08/26/2011   Enteritis 08/25/2011    PCP: Delbert Clam, MD   REFERRING PROVIDER: Shirly Carlin CROME, PA-C   REFERRING DIAG: [id number] (ICD-10-CM) - S/P right rotator cuff repair   THERAPY DIAG:  Acute pain of right shoulder  Muscle weakness (generalized)  Stiffness of right shoulder, not elsewhere  classified  Rationale for Evaluation and Treatment: Rehabilitation  ONSET DATE: 03/19/24  SUBJECTIVE:                                                                                                                                                                                      SUBJECTIVE STATEMENT: Pt reports her R shoulder has been bothering her more the past two days  EVAL: Pt reports her R shoulder is sore, but is improving since surgery. Pt notes the sling has been Dced, but to can wear in public as needed. Initially injured fer R shoulder during a fall backwards.  Hand dominance: Right  PERTINENT HISTORY: See above, COPD, DM2  PAIN:  Are you having pain? Yes: NPRS scale: 8/10 Pain location: anterior R shoulder Pain description: throb, ache, sharp Aggravating factors: When pain meds wear off Relieving factors: Rest  PRECAUTIONS: Shoulder no lifting  RED FLAGS: None   WEIGHT BEARING RESTRICTIONS: Yes no pulling or pushing  FALLS:  Has patient fallen in last 6 months? 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  PATIENT SURVEYS:  Quick Dash: 84%  Minimally Clinically Important Difference (MCID): 15-20 points  COGNITION: Overall cognitive status: Within functional limits for tasks assessed     SENSATION: WFL  POSTURE: Forward head and rounded shoulders  UPPER EXTREMITY ROM:   Passive ROM Right eval Left eval Rt 04/25/24  Shoulder flexion 90  110d  Shoulder extension     Shoulder abduction 75    Shoulder adduction     Shoulder internal rotation     Shoulder external rotation 40  45d  Elbow flexion     Elbow extension     Wrist  flexion     Wrist extension     Wrist ulnar deviation     Wrist radial deviation     Wrist pronation     Wrist supination     (Blank rows = not tested)  UPPER EXTREMITY MMT:  NT MMT Right  eval Left eval  Shoulder flexion    Shoulder extension    Shoulder abduction    Shoulder adduction    Shoulder internal rotation    Shoulder external rotation    Middle trapezius    Lower trapezius    Elbow flexion    Elbow extension    Wrist flexion    Wrist extension    Wrist ulnar deviation    Wrist radial deviation    Wrist pronation    Wrist supination    Grip strength (lbs)    (Blank rows = not tested)  SHOULDER SPECIAL TESTS: NT  JOINT MOBILITY TESTING:  WNLs  PALPATION:  TTP to the L peri-GH area                                                                                                                       TREATMENT DATE:  [facility] Adult PT Treatment:                                                DATE: 05/01/24 Therapeutic Exercise: Seated Scapular Retraction 10 reps 5 hold Seated Shoulder Flexion Towel Slide at Table Top 15 reps 5 hold Seated Shoulder Scaption Slide at Table Top 15 reps 5 hold Seated Shoulder External Rotation PROM on Table 15 reps 5 hold Shoulder flex , ext  and ER iso 5 sec x 10 each Manual Therapy: PROM for shoulder flexion, ER, and abd Modalities: Vaso 15 min 36d, low pressure Self care: Encouraged pt to use cold packs several times a day, 10 to 15 mins to mange pain and swelling  [facility] Adult PT Treatment:              6 weeks s/p Sx                                  DATE: 04/27/24 Therapeutic Exercise: Pendulum Standing scap retract  Shoulder table flexion using foam roller  Shoulder flexion table slides  Shoulder scaption table slides Shoulder flex , ext  and ER iso 5 sec x 10 each Manual Therapy: PROM for shoulder flexion, ER, and abd  Self Care: Avoidance of shoulder hike, no lifting    [facility] Adult PT Treatment:            5 weeks  s/p Sx                  DATE: 04/25/24 Therapeutic Exercise: Seated Scapular Retraction 10 reps 5 hold Seated Shoulder Flexion Towel Slide at Table Top 10 reps - 5 hold Seated Shoulder Scaption Slide at Table Top 10 reps - 5 hold Seated Shoulder External Rotation PROM on Table 10 reps - 5 hold Standing pendulum exs, started but stopped when pt unable to relax arm Supine shoulder flexion  c L hand assist x10 5 Supine shoulder ER c dowel x10 5 Isometrics shoulder flexion and extension x5 5  Manual Therapy: PROM for shoulder flexion, ER, and abd  PATIENT EDUCATION: Education details: Eval findings, POC, HEP, self care  Person educated: Patient Education method: Explanation, Demonstration, Tactile cues, Verbal cues, and Handouts Education comprehension: verbalized understanding, returned demonstration, verbal cues required, and tactile cues required  HOME EXERCISE PROGRAM: Access Code: [email or web address] URL: [email or web address] Date: 04/25/2024 Prepared by: Dasie Daft  Exercises - Seated Scapular Retraction  - 2 x daily - 7 x weekly - 1 sets - 10 reps - 5 hold - Seated Shoulder Flexion Towel Slide at Table Top  - 2 x daily - 7 x weekly - 1 sets - 10 reps - 5 hold - Seated Shoulder Scaption Slide at Table Top with Forearm in Neutral (Mirrored)  - 2 x daily - 7 x weekly - 1 sets - 10 reps - 5 hold - Seated Shoulder External Rotation PROM on Table (Mirrored)  - 2 x daily - 7 x weekly - 1 sets - 10 reps - 5 hold - Supine Shoulder Flexion AAROM with Hands Clasped  - 2 x daily - 7 x weekly - 1 sets - 10 reps - 5 hold - Supine Shoulder External Rotation in 45 Degrees Abduction AAROM with Dowel  - 2 x daily - 7 x weekly - 1 sets - 10 reps - 5 hold - Isometric Shoulder Flexion at Wall  - 2 x daily - 7 x weekly - 1 sets - 5 reps - 5 hold - Isometric Shoulder Extension at Wall  - 1 x daily - 7 x weekly - 1 sets - 5 reps - 5 hold  ASSESSMENT:  CLINICAL IMPRESSION: PT was continued for  passive and AA ROMs exs for the R shoulder and active scapular and isometric strengthening for shoulder flexion and extension. Pt tolerated prescribed exs in PT today without adverse effects. PROMs are similar to last week. Encouraged pt to use cold packs several times a day to mange pain and swelling. Pt voiced understanding.   OBJECTIVE IMPAIRMENTS: decreased activity tolerance, decreased ROM, decreased strength, impaired UE functional use, and pain.   ACTIVITY LIMITATIONS: carrying, lifting, bathing, toileting, dressing, reach over head, and caring for others  PARTICIPATION LIMITATIONS: meal prep, cleaning, laundry, driving, shopping, community activity, and occupation  PERSONAL FACTORS: Past/current experiences, Time since onset of injury/illness/exacerbation, and 1 comorbidity: DM2 are also affecting patient's functional outcome.   REHAB POTENTIAL: Good  CLINICAL DECISION MAKING: Evolving/moderate complexity  EVALUATION COMPLEXITY: Moderate   GOALS:  SHORT TERM GOALS: Target date: 05/13/24  Pt will be Ind in an initial HEP  Baseline: started Goal status: ONGOING  2.  Increase R shoulder PROM for flexion to 120d and ER to 65d for appropriate progress with her R shoulder RC rehab  Baseline: 90 and 40 respectively Goal status: ONGOING  LONG TERM GOALS: Target date: 06/15/24  Pt will be Ind in a final HEP to maintain achieved LOF Baseline:  Goal status: INITIAL  2.  Pt will demonstrate AROM for the R shoulder to flex=130, abd=120, ER to T1 and IR to L4 for needed functional ROM for daily activities Baseline:  Goal status: INITIAL  3.  Pt will report 50% or greater improvement in her r shoulder pain for improved function and QOL Baseline: 8/10 Goal status: INITIAL  5.  Pt's Quick Dash score will improve to 69% or better as indication of improved function  Baseline: 84% Goal status: INITIAL  6.  PT will developed advanced R shoulder LTGs as appropriate for her  recovery Baseline:  Goal status: INITIAL  PLAN:  PT FREQUENCY: 2x/week  PT DURATION: 8 weeks  PLANNED INTERVENTIONS: 97164- PT Re-evaluation, 97110-Therapeutic exercises, 97530- Therapeutic activity, [id number]- Neuromuscular re-education, 97535- Self Care, 02859- Manual therapy, G0283- Electrical stimulation (unattended), 97016- Vasopneumatic device, 20560 (1-2 muscles), 20561 (3+ muscles)- Dry Needling, Patient/Family education, Taping, Joint mobilization, Cryotherapy, and Moist heat  PLAN FOR NEXT SESSION: Assess response to HEP; progress therex as indicated; use of modalities, manual therapy; and TPDN as indicated.  Zamier Eggebrecht MS, PT 05/01/24 10:22 AM

## 2024-05-01 ENCOUNTER — Ambulatory Visit

## 2024-05-01 DIAGNOSIS — M6281 Muscle weakness (generalized): Secondary | ICD-10-CM

## 2024-05-01 DIAGNOSIS — M25511 Pain in right shoulder: Secondary | ICD-10-CM

## 2024-05-01 DIAGNOSIS — M25611 Stiffness of right shoulder, not elsewhere classified: Secondary | ICD-10-CM

## 2024-05-02 ENCOUNTER — Telehealth: Payer: Self-pay | Admitting: Orthopedic Surgery

## 2024-05-02 NOTE — Telephone Encounter (Signed)
 Pt came in and dropped off extension of short term. Pt states she paid and signed medical release form before her surgery back in June. Pt phone number is [phone].

## 2024-05-03 NOTE — Telephone Encounter (Signed)
 Received

## 2024-05-03 NOTE — Therapy (Incomplete)
 OUTPATIENT PHYSICAL THERAPY SHOULDER TREATMENT   Patient Name: Amanda Garner MRN: [id number] DOB:1971/05/14, 53 y.o., female Today's Date: 05/03/2024  END OF SESSION:       Past Medical History:  Diagnosis Date   Acid reflux    Blood transfusion    Blood transfusion without reported diagnosis    COPD (chronic obstructive pulmonary disease) (HCC)    Diabetes mellitus without complication (HCC)    Diverticulitis    Hypertension    Sickle cell trait [facility])    Past Surgical History:  Procedure Laterality Date   ABDOMINAL HYSTERECTOMY  2001   partial hysterectomy - emergency during last C-section   BICEPT TENODESIS Right 03/19/2024   Procedure: TENODESIS, BICEPS OPEN;  Surgeon: Addie Cordella Hamilton, MD;  Location: [facility];  Service: Orthopedics;  Laterality: Right;   CESAREAN SECTION  1991; 1997; 2001   CESAREAN SECTION     COLON SURGERY  11/04/2021   FLEXIBLE SIGMOIDOSCOPY N/A 11/04/2021   Procedure: FLEXIBLE SIGMOIDOSCOPY;  Surgeon: Teresa Lonni HERO, MD;  Location: [facility];  Service: General;  Laterality: N/A;   POSTERIOR LUMBAR FUSION 2 WITH HARDWARE REMOVAL Right 03/19/2024   Procedure: ARTHROSCOPY, SHOULDER WITH DEBRIDEMENT;  Surgeon: Addie Cordella Hamilton, MD;  Location: [facility];  Service: Orthopedics;  Laterality: Right;  right shoulder arthroscopy, debridement, mini open biceps tenodesis and rotator cuff tear repair, mua   SHOULDER OPEN ROTATOR CUFF REPAIR Right 03/19/2024   Procedure: REPAIR, ROTATOR CUFF, OPEN;  Surgeon: Addie Cordella Hamilton, MD;  Location: [facility];  Service: Orthopedics;  Laterality: Right;   SMALL INTESTINE SURGERY  11/04/2021   XI ROBOTIC ASSISTED LOWER ANTERIOR RESECTION N/A 11/04/2021   Procedure: XI ROBOTIC ASSISTED LOWER ANTERIOR RESECTION, RESECTION OF COLOVAGINAL FISTULA, BILATERAL TAP BLOCK, INJECTION OF FIREFLY;  Surgeon: Teresa Lonni HERO, MD;  Location: [facility];  Service: General;   Laterality: N/A;   Patient Active Problem List   Diagnosis Date Noted   Complete tear of right rotator cuff 03/24/2024   Biceps tendonitis on right 03/24/2024   Degenerative superior labral anterior-to-posterior (SLAP) tear of right shoulder 03/24/2024   Synovitis of right shoulder 03/24/2024   COPD exacerbation (HCC) 11/21/2023   Prolonged QT interval 11/21/2023   Lactic acidosis 11/21/2023   Thyroid  nodule 11/21/2023   Type 2 diabetes mellitus (HCC) 11/05/2021   GERD (gastroesophageal reflux disease) 11/05/2021   S/P laparoscopic-assisted sigmoidectomy 11/04/2021   Abnormal CT scan 08/05/2021   Hx of diverticulitis of colon 08/05/2021   Colovaginal fistula 07/03/2021   Acquired palmar and plantar hyperkeratosis 01/25/2021   Hyperglycemia 02/01/2020   Crohn's disease of large intestine with abscess (HCC) 01/31/2020   Acute diverticulitis 01/09/2020   Diverticulitis of large intestine with abscess without bleeding 06/07/2017   Abscess of sigmoid colon due to diverticulitis 05/24/2017   Tobacco dependence 05/11/2017   Essential hypertension 05/11/2017   Reflux esophagitis 05/11/2017   Tinea pedis of left foot 04/11/2015   C. difficile colitis 08/26/2011   Enteritis 08/25/2011    PCP: Delbert Clam, MD   REFERRING PROVIDER: Shirly Carlin CROME, PA-C   REFERRING DIAG: [phone] (ICD-10-CM) - S/P right rotator cuff repair   THERAPY DIAG:  No diagnosis found.  Rationale for Evaluation and Treatment: Rehabilitation  ONSET DATE: 03/19/24  SUBJECTIVE:  SUBJECTIVE STATEMENT: Pt reports her R shoulder has been bothering her more the past two days  EVAL: Pt reports her R shoulder is sore, but is improving since surgery. Pt notes the sling has been Dced, but to can wear in public as needed. Initially  injured fer R shoulder during a fall backwards.  Hand dominance: Right  PERTINENT HISTORY: See above, COPD, DM2  PAIN:  Are you having pain? Yes: NPRS scale: 8/10 Pain location: anterior R shoulder Pain description: throb, ache, sharp Aggravating factors: When pain meds wear off Relieving factors: Rest  PRECAUTIONS: Shoulder no lifting  RED FLAGS: None   WEIGHT BEARING RESTRICTIONS: Yes no pulling or pushing  FALLS:  Has patient fallen in last 6 months? 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  PATIENT SURVEYS:  Quick Dash: 84%  Minimally Clinically Important Difference (MCID): 15-20 points  COGNITION: Overall cognitive status: Within functional limits for tasks assessed     SENSATION: WFL  POSTURE: Forward head and rounded shoulders  UPPER EXTREMITY ROM:   Passive ROM Right eval Left eval Rt 04/25/24  Shoulder flexion 90  110d  Shoulder extension     Shoulder abduction 75    Shoulder adduction     Shoulder internal rotation     Shoulder external rotation 40  45d  Elbow flexion     Elbow extension     Wrist flexion     Wrist extension     Wrist ulnar deviation     Wrist radial deviation     Wrist pronation     Wrist supination     (Blank rows = not tested)  UPPER EXTREMITY MMT:  NT MMT Right eval Left eval  Shoulder flexion    Shoulder extension    Shoulder abduction    Shoulder adduction    Shoulder internal rotation    Shoulder external rotation    Middle trapezius    Lower trapezius    Elbow flexion    Elbow extension    Wrist flexion    Wrist extension    Wrist ulnar deviation    Wrist radial deviation    Wrist  pronation    Wrist supination    Grip strength (lbs)    (Blank rows = not tested)  SHOULDER SPECIAL TESTS: NT  JOINT MOBILITY TESTING:  WNLs  PALPATION:  TTP to the L peri-[facility] area                                                                                                                       TREATMENT DATE:  [facility] Adult PT Treatment:  DATE: 05/04/24 Therapeutic Exercise: Seated Scapular Retraction 10 reps 5 hold Seated Shoulder Flexion Towel Slide at Table Top 15 reps 5 hold Seated Shoulder Scaption Slide at Table Top 15 reps 5 hold Seated Shoulder External Rotation PROM on Table 15 reps 5 hold Shoulder flex , ext  and ER iso 5 sec x 10 each Manual Therapy: PROM for shoulder flexion, ER, and abd Modalities: Vaso 15 min 36d, low pressure Self care: Encouraged pt to use cold packs several times a day, 10 to 15 mins to mange pain and swelling  [facility] Adult PT Treatment:                                                DATE: 05/01/24 Therapeutic Exercise: Seated Scapular Retraction 10 reps 5 hold Seated Shoulder Flexion Towel Slide at Table Top 15 reps 5 hold Seated Shoulder Scaption Slide at Table Top 15 reps 5 hold Seated Shoulder External Rotation PROM on Table 15 reps 5 hold Shoulder flex , ext  and ER iso 5 sec x 10 each Manual Therapy: PROM for shoulder flexion, ER, and abd Modalities: Vaso 15 min 36d, low pressure Self care: Encouraged pt to use cold packs several times a day, 10 to 15 mins to mange pain and swelling  [facility] Adult PT Treatment:              6 weeks s/p Sx                                  DATE: 04/27/24 Therapeutic Exercise: Pendulum Standing scap retract  Shoulder table flexion using foam roller  Shoulder flexion table slides  Shoulder scaption table slides Shoulder flex , ext  and ER iso 5 sec x 10 each Manual Therapy: PROM for shoulder flexion, ER, and abd  Self Care: Avoidance of shoulder hike, no  lifting   PATIENT EDUCATION: Education details: Eval findings, POC, HEP, self care  Person educated: Patient Education method: Explanation, Demonstration, Tactile cues, Verbal cues, and Handouts Education comprehension: verbalized understanding, returned demonstration, verbal cues required, and tactile cues required  HOME EXERCISE PROGRAM: Access Code: [email or web address] URL: [email or web address] Date: 04/25/2024 Prepared by: Dasie Daft  Exercises - Seated Scapular Retraction  - 2 x daily - 7 x weekly - 1 sets - 10 reps - 5 hold - Seated Shoulder Flexion Towel Slide at Table Top  - 2 x daily - 7 x weekly - 1 sets - 10 reps - 5 hold - Seated Shoulder Scaption Slide at Table Top with Forearm in Neutral (Mirrored)  - 2 x daily - 7 x weekly - 1 sets - 10 reps - 5 hold - Seated Shoulder External Rotation PROM on Table (Mirrored)  - 2 x daily - 7 x weekly - 1 sets - 10 reps - 5 hold - Supine Shoulder Flexion AAROM with Hands Clasped  - 2 x daily - 7 x weekly - 1 sets - 10 reps - 5 hold - Supine Shoulder External Rotation in 45 Degrees Abduction AAROM with Dowel  - 2 x daily - 7 x weekly - 1 sets - 10 reps - 5 hold - Isometric Shoulder Flexion at Wall  - 2 x daily - 7 x weekly - 1 sets - 5 reps - 5 hold - Isometric Shoulder  Extension at Wall  - 1 x daily - 7 x weekly - 1 sets - 5 reps - 5 hold  ASSESSMENT:  CLINICAL IMPRESSION: PT was continued for passive and AA ROMs exs for the R shoulder and active scapular and isometric strengthening for shoulder flexion and extension. Pt tolerated prescribed exs in PT today without adverse effects. PROMs are similar to last week. Encouraged pt to use cold packs several times a day to mange pain and swelling. Pt voiced understanding.   OBJECTIVE IMPAIRMENTS: decreased activity tolerance, decreased ROM, decreased strength, impaired UE functional use, and pain.   ACTIVITY LIMITATIONS: carrying, lifting, bathing, toileting, dressing, reach over head,  and caring for others  PARTICIPATION LIMITATIONS: meal prep, cleaning, laundry, driving, shopping, community activity, and occupation  PERSONAL FACTORS: Past/current experiences, Time since onset of injury/illness/exacerbation, and 1 comorbidity: DM2 are also affecting patient's functional outcome.   REHAB POTENTIAL: Good  CLINICAL DECISION MAKING: Evolving/moderate complexity  EVALUATION COMPLEXITY: Moderate   GOALS:  SHORT TERM GOALS: Target date: 05/13/24  Pt will be Ind in an initial HEP  Baseline: started Goal status: ONGOING  2.  Increase R shoulder PROM for flexion to 120d and ER to 65d for appropriate progress with her R shoulder RC rehab  Baseline: 90 and 40 respectively Goal status: ONGOING  LONG TERM GOALS: Target date: 06/15/24  Pt will be Ind in a final HEP to maintain achieved LOF Baseline:  Goal status: INITIAL  2.  Pt will demonstrate AROM for the R shoulder to flex=130, abd=120, ER to T1 and IR to L4 for needed functional ROM for daily activities Baseline:  Goal status: INITIAL  3.  Pt will report 50% or greater improvement in her r shoulder pain for improved function and QOL Baseline: 8/10 Goal status: INITIAL  5.  Pt's Quick Dash score will improve to 69% or better as indication of improved function  Baseline: 84% Goal status: INITIAL  6.  PT will developed advanced R shoulder LTGs as appropriate for her recovery Baseline:  Goal status: INITIAL  PLAN:  PT FREQUENCY: 2x/week  PT DURATION: 8 weeks  PLANNED INTERVENTIONS: 97164- PT Re-evaluation, 97110-Therapeutic exercises, 97530- Therapeutic activity, [id number]- Neuromuscular re-education, 97535- Self Care, 02859- Manual therapy, G0283- Electrical stimulation (unattended), 97016- Vasopneumatic device, 20560 (1-2 muscles), 20561 (3+ muscles)- Dry Needling, Patient/Family education, Taping, Joint mobilization, Cryotherapy, and Moist heat  PLAN FOR NEXT SESSION: Assess response to HEP; progress therex  as indicated; use of modalities, manual therapy; and TPDN as indicated.  Keena Dinse MS, PT 05/03/24 7:35 AM

## 2024-05-04 ENCOUNTER — Ambulatory Visit

## 2024-05-07 NOTE — Therapy (Signed)
 OUTPATIENT PHYSICAL THERAPY SHOULDER TREATMENT   Patient Name: Amanda Garner MRN: [id number] DOB:1971-04-12, 53 y.o., female Today's Date: 05/08/2024  END OF SESSION:  PT End of Session - 05/08/24 0943     Visit Number 5    Number of Visits 17    Date for PT Re-Evaluation 06/15/24    Authorization Type [employer] OTHER    Authorization - Visit Number 4    Authorization - Number of Visits 60    PT Start Time [phone]    PT Stop Time 1027    PT Time Calculation (min) 48 min    Activity Tolerance Patient tolerated treatment well    Behavior During Therapy WFL for tasks assessed/performed              Past Medical History:  Diagnosis Date   Acid reflux    Blood transfusion    Blood transfusion without reported diagnosis    COPD (chronic obstructive pulmonary disease) (HCC)    Diabetes mellitus without complication (HCC)    Diverticulitis    Hypertension    Sickle cell trait (HCC)    Past Surgical History:  Procedure Laterality Date   ABDOMINAL HYSTERECTOMY  2001   partial hysterectomy - emergency during last C-section   BICEPT TENODESIS Right 03/19/2024   Procedure: TENODESIS, BICEPS OPEN;  Surgeon: Addie Cordella Hamilton, MD;  Location: [facility];  Service: Orthopedics;  Laterality: Right;   CESAREAN SECTION  1991; 1997; 2001   CESAREAN SECTION     COLON SURGERY  11/04/2021   FLEXIBLE SIGMOIDOSCOPY N/A 11/04/2021   Procedure: FLEXIBLE SIGMOIDOSCOPY;  Surgeon: Teresa Lonni HERO, MD;  Location: [facility];  Service: General;  Laterality: N/A;   POSTERIOR LUMBAR FUSION 2 WITH HARDWARE REMOVAL Right 03/19/2024   Procedure: ARTHROSCOPY, SHOULDER WITH DEBRIDEMENT;  Surgeon: Addie Cordella Hamilton, MD;  Location: [facility];  Service: Orthopedics;  Laterality: Right;  right shoulder arthroscopy, debridement, mini open biceps tenodesis and rotator cuff tear repair, mua   SHOULDER OPEN ROTATOR CUFF REPAIR Right 03/19/2024   Procedure: REPAIR,  ROTATOR CUFF, OPEN;  Surgeon: Addie Cordella Hamilton, MD;  Location: [facility];  Service: Orthopedics;  Laterality: Right;   SMALL INTESTINE SURGERY  11/04/2021   XI ROBOTIC ASSISTED LOWER ANTERIOR RESECTION N/A 11/04/2021   Procedure: XI ROBOTIC ASSISTED LOWER ANTERIOR RESECTION, RESECTION OF COLOVAGINAL FISTULA, BILATERAL TAP BLOCK, INJECTION OF FIREFLY;  Surgeon: Teresa Lonni HERO, MD;  Location: [facility];  Service: General;  Laterality: N/A;   Patient Active Problem List   Diagnosis Date Noted   Complete tear of right rotator cuff 03/24/2024   Biceps tendonitis on right 03/24/2024   Degenerative superior labral anterior-to-posterior (SLAP) tear of right shoulder 03/24/2024   Synovitis of right shoulder 03/24/2024   COPD exacerbation (HCC) 11/21/2023   Prolonged QT interval 11/21/2023   Lactic acidosis 11/21/2023   Thyroid  nodule 11/21/2023   Type 2 diabetes mellitus (HCC) 11/05/2021   GERD (gastroesophageal reflux disease) 11/05/2021   S/P laparoscopic-assisted sigmoidectomy 11/04/2021   Abnormal CT scan 08/05/2021   Hx of diverticulitis of colon 08/05/2021   Colovaginal fistula 07/03/2021   Acquired palmar and plantar hyperkeratosis 01/25/2021   Hyperglycemia 02/01/2020   Crohn's disease of large intestine with abscess (HCC) 01/31/2020   Acute diverticulitis 01/09/2020   Diverticulitis of large intestine with abscess without bleeding 06/07/2017   Abscess of sigmoid colon due to diverticulitis 05/24/2017   Tobacco dependence 05/11/2017   Essential hypertension 05/11/2017   Reflux esophagitis  05/11/2017   Tinea pedis of left foot 04/11/2015   C. difficile colitis 08/26/2011   Enteritis 08/25/2011    PCP: Delbert Clam, MD   REFERRING PROVIDER: Shirly Carlin CROME, PA-C   REFERRING DIAG: [phone] (ICD-10-CM) - S/P right rotator cuff repair   THERAPY DIAG:  Acute pain of right shoulder  Muscle weakness (generalized)  Stiffness of right shoulder, not  elsewhere classified  Rationale for Evaluation and Treatment: Rehabilitation  ONSET DATE: 03/19/24  SUBJECTIVE:                                                                                                                                                                                      SUBJECTIVE STATEMENT: Pt reports her R shoulder is doing better and has not taken pain medication for over a week.  EVAL: Pt reports her R shoulder is sore, but is improving since surgery. Pt notes the sling has been Dced, but to can wear in public as needed. Initially injured fer R shoulder during a fall backwards.  Hand dominance: Right  PERTINENT HISTORY: See above, COPD, DM2  PAIN:  Are you having pain? Yes: NPRS scale: 5/10 Pain location: anterior R shoulder Pain description: throb, ache, sharp Aggravating factors: When pain meds wear off Relieving factors: Rest  PRECAUTIONS: Shoulder no lifting  RED FLAGS: None   WEIGHT BEARING RESTRICTIONS: Yes no pulling or pushing  FALLS:  Has patient fallen in last 6 months? 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  PATIENT SURVEYS:  Quick Dash: 84%  Minimally Clinically Important Difference (MCID): 15-20 points  COGNITION: Overall cognitive status: Within functional limits for tasks assessed     SENSATION: WFL  POSTURE: Forward head and rounded shoulders  UPPER EXTREMITY ROM:   Passive ROM Right eval Left eval Rt 04/25/24  Shoulder flexion 90  110d  Shoulder extension     Shoulder abduction 75    Shoulder adduction     Shoulder internal rotation     Shoulder external rotation 40  45d  Elbow flexion      Elbow extension     Wrist flexion     Wrist extension     Wrist ulnar deviation     Wrist radial deviation     Wrist pronation     Wrist supination     (Blank rows = not tested)  UPPER EXTREMITY MMT:  NT MMT Right eval Left eval  Shoulder flexion    Shoulder extension    Shoulder abduction    Shoulder adduction    Shoulder internal rotation    Shoulder external rotation    Middle trapezius    Lower trapezius    Elbow flexion    Elbow extension    Wrist flexion    Wrist extension    Wrist ulnar deviation    Wrist radial deviation    Wrist pronation    Wrist supination    Grip strength (lbs)    (Blank rows = not tested)  SHOULDER SPECIAL TESTS: NT  JOINT MOBILITY TESTING:  WNLs  PALPATION:  TTP to the L peri-GH area                                                                                                                       TREATMENT DATE:  [facility] Adult PT Treatment:                7 weeks s/p sx                                DATE: 05/08/24 Therapeutic Exercise: Seated Scapular Retraction x10 5 Supine Shoulder Flexion dowel x10 5 Supine Shoulder Scaption dowel x10 5 Supine Shoulder ER dowel x10 5 Supine Shoulder chest press dowel x10 5 45d incline shoulder flexion c dowel 2x10 Standing, ext  iso 5 sec x 10 each Manual Therapy: PROM for shoulder flexion, ER, and abd Modalities: Cold pack x 10 mins to the R shoulder  [facility] Adult PT Treatment:              6 weeks s/p Sx                                  DATE: 04/27/24 Therapeutic Exercise: Pendulum Standing scap retract  Shoulder table flexion using foam roller  Shoulder flexion table slides  Shoulder scaption table slides Shoulder flex , ext  and ER iso 5 sec x 10 each Manual Therapy: PROM for shoulder flexion, ER, and abd  Self Care: Avoidance of shoulder hike, no lifting   PATIENT EDUCATION: Education details: Eval findings, POC, HEP, self care  Person educated: Patient Education  method: Explanation, Demonstration, Tactile cues, Verbal cues, and Handouts Education comprehension: verbalized understanding, returned demonstration, verbal cues required, and tactile cues required  HOME EXERCISE PROGRAM: Access Code: [email or web address] URL: [email or web address] Date: 04/25/2024 Prepared by: Dasie Daft  Exercises - Seated Scapular Retraction  - 2 x daily - 7 x weekly - 1 sets - 10 reps - 5 hold - Seated Shoulder Flexion Towel Slide at Table Top  - 2 x daily - 7 x weekly - 1 sets - 10 reps - 5 hold - Seated Shoulder Scaption Slide at Table Top with Forearm in Neutral (Mirrored)  -  2 x daily - 7 x weekly - 1 sets - 10 reps - 5 hold - Seated Shoulder External Rotation PROM on Table (Mirrored)  - 2 x daily - 7 x weekly - 1 sets - 10 reps - 5 hold - Supine Shoulder Flexion AAROM with Hands Clasped  - 2 x daily - 7 x weekly - 1 sets - 10 reps - 5 hold - Supine Shoulder External Rotation in 45 Degrees Abduction AAROM with Dowel  - 2 x daily - 7 x weekly - 1 sets - 10 reps - 5 hold - Isometric Shoulder Flexion at Wall  - 2 x daily - 7 x weekly - 1 sets - 5 reps - 5 hold - Isometric Shoulder Extension at Wall  - 1 x daily - 7 x weekly - 1 sets - 5 reps - 5 hold  ASSESSMENT:  CLINICAL IMPRESSION: AAROM for shoulder flexion c dowel was progressed to a 45d incline. Ease of  completing AA movement with the R shoulder/UE is improving. Pt's report of pain is decreasing. Continue to recommend the use of a cold pack to assist with pain and swelling management at home. Pt tolerated PT today without adverse effects. Pt will continue to benefit from skilled PT to address impairments for improved R shoulder/UE function.  OBJECTIVE IMPAIRMENTS: decreased activity tolerance, decreased ROM, decreased strength, impaired UE functional use, and pain.   ACTIVITY LIMITATIONS: carrying, lifting, bathing, toileting, dressing, reach over head, and caring for others  PARTICIPATION LIMITATIONS:  meal prep, cleaning, laundry, driving, shopping, community activity, and occupation  PERSONAL FACTORS: Past/current experiences, Time since onset of injury/illness/exacerbation, and 1 comorbidity: DM2 are also affecting patient's functional outcome.   REHAB POTENTIAL: Good  CLINICAL DECISION MAKING: Evolving/moderate complexity  EVALUATION COMPLEXITY: Moderate   GOALS:  SHORT TERM GOALS: Target date: 05/13/24  Pt will be Ind in an initial HEP  Baseline: started Goal status: ONGOING  2.  Increase R shoulder PROM for flexion to 120d and ER to 65d for appropriate progress with her R shoulder [facility] rehab  Baseline: 90 and 40 respectively Goal status: ONGOING  LONG TERM GOALS: Target date: 06/15/24  Pt will be Ind in a final HEP to maintain achieved LOF Baseline:  Goal status: INITIAL  2.  Pt will demonstrate AROM for the R shoulder to flex=130, abd=120, ER to T1 and IR to L4 for needed functional ROM for daily activities Baseline:  Goal status: INITIAL  3.  Pt will report 50% or greater improvement in her r shoulder pain for improved function and QOL Baseline: 8/10 Goal status: INITIAL  5.  Pt's Quick Dash score will improve to 69% or better as indication of improved function  Baseline: 84% Goal status: INITIAL  6.  PT will developed advanced R shoulder LTGs as appropriate for her recovery Baseline:  Goal status: INITIAL  PLAN:  PT FREQUENCY: 2x/week  PT DURATION: 8 weeks  PLANNED INTERVENTIONS: 97164- PT Re-evaluation, 97110-Therapeutic exercises, 97530- Therapeutic activity, [id number]- Neuromuscular re-education, 97535- Self Care, 02859- Manual therapy, [id number]- Electrical stimulation (unattended), 97016- Vasopneumatic device, 20560 (1-2 muscles), 20561 (3+ muscles)- Dry Needling, Patient/Family education, Taping, Joint mobilization, Cryotherapy, and Moist heat  PLAN FOR NEXT SESSION: Assess response to HEP; progress therex as indicated; use of modalities, manual therapy; and  TPDN as indicated.  Analina Filla MS, PT 05/08/24 1:01 PM

## 2024-05-08 ENCOUNTER — Ambulatory Visit

## 2024-05-08 DIAGNOSIS — M25511 Pain in right shoulder: Secondary | ICD-10-CM | POA: Diagnosis not present

## 2024-05-08 DIAGNOSIS — M6281 Muscle weakness (generalized): Secondary | ICD-10-CM

## 2024-05-08 DIAGNOSIS — M25611 Stiffness of right shoulder, not elsewhere classified: Secondary | ICD-10-CM

## 2024-05-10 ENCOUNTER — Encounter: Admitting: Orthopedic Surgery

## 2024-05-11 ENCOUNTER — Other Ambulatory Visit (INDEPENDENT_AMBULATORY_CARE_PROVIDER_SITE_OTHER): Payer: Self-pay | Admitting: Orthopedic Surgery

## 2024-05-11 ENCOUNTER — Encounter: Payer: Self-pay | Admitting: Orthopedic Surgery

## 2024-05-11 ENCOUNTER — Ambulatory Visit: Admitting: Physical Therapy

## 2024-05-11 ENCOUNTER — Encounter: Payer: Self-pay | Admitting: Physical Therapy

## 2024-05-11 ENCOUNTER — Ambulatory Visit: Admitting: Orthopedic Surgery

## 2024-05-11 DIAGNOSIS — M6281 Muscle weakness (generalized): Secondary | ICD-10-CM

## 2024-05-11 DIAGNOSIS — M25511 Pain in right shoulder: Secondary | ICD-10-CM

## 2024-05-11 DIAGNOSIS — Z9889 Other specified postprocedural states: Secondary | ICD-10-CM

## 2024-05-11 MED ORDER — HYDROMORPHONE HCL 2 MG PO TABS: 1.0000 mg | ORAL_TABLET | Freq: Three times a day (TID) | ORAL | 0 refills | Status: AC | PRN

## 2024-05-11 NOTE — Progress Notes (Signed)
 Post-Op Visit Note   Patient: Amanda Garner           Date of Birth: 04/24/1971           MRN: [id number] Visit Date: 05/11/2024 PCP: Delbert Clam, MD   Assessment & Plan:  Chief Complaint:  Chief Complaint  Patient presents with   Right Shoulder - Routine Post Op       03/19/2024 right shoulder arthroscopy, deb, MOBT, [facility]     Visit Diagnoses:  1. S/P right rotator cuff repair     Plan:.  The patient underwent right shoulder arthroscopy in rotator cuff repair.  She fell the day before yesterday tripping over a tree stump.  Having some pain.  Takes ibuprofen  without much relief.  On exam she has well-healed incision.  Her shoulder is little bit stiff but her rotator cuff strength feels intact and there is no coarse grinding or crepitus.  She is okay to start strengthening now.  3 times a week for 6 weeks.  Follow-up in 6 weeks for clinical recheck.  Pain medicine refill x 1 today  Follow-Up Instructions: No follow-ups on file.   Orders:  Orders Placed This Encounter  Procedures   Ambulatory referral to Physical Therapy   No orders of the defined types were placed in this encounter.   Imaging: No results found.  PMFS History: Patient Active Problem List   Diagnosis Date Noted   Complete tear of right rotator cuff 03/24/2024   Biceps tendonitis on right 03/24/2024   Degenerative superior labral anterior-to-posterior (SLAP) tear of right shoulder 03/24/2024   Synovitis of right shoulder 03/24/2024   COPD exacerbation (HCC) 11/21/2023   Prolonged QT interval 11/21/2023   Lactic acidosis 11/21/2023   Thyroid  nodule 11/21/2023   Type 2 diabetes mellitus (HCC) 11/05/2021   GERD (gastroesophageal reflux disease) 11/05/2021   S/P laparoscopic-assisted sigmoidectomy 11/04/2021   Abnormal CT scan 08/05/2021   Hx of diverticulitis of colon 08/05/2021   Colovaginal fistula 07/03/2021   Acquired palmar and plantar hyperkeratosis 01/25/2021   Hyperglycemia 02/01/2020    Crohn's disease of large intestine with abscess (HCC) 01/31/2020   Acute diverticulitis 01/09/2020   Diverticulitis of large intestine with abscess without bleeding 06/07/2017   Abscess of sigmoid colon due to diverticulitis 05/24/2017   Tobacco dependence 05/11/2017   Essential hypertension 05/11/2017   Reflux esophagitis 05/11/2017   Tinea pedis of left foot 04/11/2015   C. difficile colitis 08/26/2011   Enteritis 08/25/2011   Past Medical History:  Diagnosis Date   Acid reflux    Blood transfusion    Blood transfusion without reported diagnosis    COPD (chronic obstructive pulmonary disease) (HCC)    Diabetes mellitus without complication (HCC)    Diverticulitis    Hypertension    Sickle cell trait (HCC)     Family History  Problem Relation Age of Onset   Hypertension Mother    Diabetes Mother    Diabetes type II Mother    Diabetes type II Father    Stroke Father    Lung cancer Father 25   Esophageal cancer Maternal Grandfather    Colon cancer Neg Hx    Pancreatic cancer Neg Hx    Stomach cancer Neg Hx     Past Surgical History:  Procedure Laterality Date   ABDOMINAL HYSTERECTOMY  2001   partial hysterectomy - emergency during last C-section   BICEPT TENODESIS Right 03/19/2024   Procedure: TENODESIS, BICEPS OPEN;  Surgeon: Addie Cordella Hamilton, MD;  Location: [facility];  Service: Orthopedics;  Laterality: Right;   CESAREAN SECTION  1991; 1997; 2001   CESAREAN SECTION     COLON SURGERY  11/04/2021   FLEXIBLE SIGMOIDOSCOPY N/A 11/04/2021   Procedure: FLEXIBLE SIGMOIDOSCOPY;  Surgeon: Teresa Lonni HERO, MD;  Location: [facility];  Service: General;  Laterality: N/A;   POSTERIOR LUMBAR FUSION 2 WITH HARDWARE REMOVAL Right 03/19/2024   Procedure: ARTHROSCOPY, SHOULDER WITH DEBRIDEMENT;  Surgeon: Addie Cordella Hamilton, MD;  Location: [facility];  Service: Orthopedics;  Laterality: Right;  right shoulder arthroscopy, debridement, mini open biceps  tenodesis and rotator cuff tear repair, mua   SHOULDER OPEN ROTATOR CUFF REPAIR Right 03/19/2024   Procedure: REPAIR, ROTATOR CUFF, OPEN;  Surgeon: Addie Cordella Hamilton, MD;  Location: [facility];  Service: Orthopedics;  Laterality: Right;   SMALL INTESTINE SURGERY  11/04/2021   XI ROBOTIC ASSISTED LOWER ANTERIOR RESECTION N/A 11/04/2021   Procedure: XI ROBOTIC ASSISTED LOWER ANTERIOR RESECTION, RESECTION OF COLOVAGINAL FISTULA, BILATERAL TAP BLOCK, INJECTION OF FIREFLY;  Surgeon: Teresa Lonni HERO, MD;  Location: [facility];  Service: General;  Laterality: N/A;   Social History   Occupational History   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, nature: [employer]    Comment: [employer]  Tobacco Use   Smoking status: Some Days    Current packs/day: 0.50    Average packs/day: 0.5 packs/day for 31.0 years (15.5 ttl pk-yrs)    Types: Cigarettes   Smokeless tobacco: Never  Vaping Use   Vaping status: Never Used  Substance and Sexual Activity   Alcohol use: Yes    Alcohol/week: 1.0 standard drink of alcohol    Types: 1 Standard drinks or equivalent per week    Comment: occassionally   Drug use: Yes    Frequency: 7.0 times per week    Types: Marijuana   Sexual activity: Yes    Partners: Male    Birth control/protection: Surgical

## 2024-05-11 NOTE — Progress Notes (Signed)
 Post-Op Visit Note   Patient: Amanda Garner           Date of Birth: 1970-12-30           MRN: [id number] Visit Date: 05/11/2024 PCP: Delbert Clam, MD   Assessment & Plan:  Chief Complaint: No chief complaint on file.  Visit Diagnoses:  1. S/P right rotator cuff repair     Plan:  The patient is now 6 weeks out right shoulder rotator cuff tear repair performed 728.  Clemens the day before yesterday when she tripped over a tree stump.  Went to therapy this morning.  On exam she has a little bit of a stiff shoulder but no coarseness or grinding with passive range of motion.  Takes ibuprofen  without relief.  Plan a 6-week return.  Okay for strengthening and range of motion with physical therapy at [facility] 3 times a week for 6 weeks pain medicine refill x 1 only.  Come back in 6 weeks for clinical recheck.  Follow-Up Instructions: No follow-ups on file.   Orders:  No orders of the defined types were placed in this encounter.  Meds ordered this encounter  Medications   HYDROmorphone  (DILAUDID ) 2 MG tablet    Sig: Take 0.5-1 tablets (1-2 mg total) by mouth every 8 (eight) hours as needed for severe pain (pain score 7-10).    Dispense:  18 tablet    Refill:  0    Imaging: No results found.  PMFS History: Patient Active Problem List   Diagnosis Date Noted   Complete tear of right rotator cuff 03/24/2024   Biceps tendonitis on right 03/24/2024   Degenerative superior labral anterior-to-posterior (SLAP) tear of right shoulder 03/24/2024   Synovitis of right shoulder 03/24/2024   COPD exacerbation (HCC) 11/21/2023   Prolonged QT interval 11/21/2023   Lactic acidosis 11/21/2023   Thyroid  nodule 11/21/2023   Type 2 diabetes mellitus (HCC) 11/05/2021   GERD (gastroesophageal reflux disease) 11/05/2021   S/P laparoscopic-assisted sigmoidectomy 11/04/2021   Abnormal CT scan 08/05/2021   Hx of diverticulitis of colon 08/05/2021   Colovaginal fistula 07/03/2021   Acquired  palmar and plantar hyperkeratosis 01/25/2021   Hyperglycemia 02/01/2020   Crohn's disease of large intestine with abscess (HCC) 01/31/2020   Acute diverticulitis 01/09/2020   Diverticulitis of large intestine with abscess without bleeding 06/07/2017   Abscess of sigmoid colon due to diverticulitis 05/24/2017   Tobacco dependence 05/11/2017   Essential hypertension 05/11/2017   Reflux esophagitis 05/11/2017   Tinea pedis of left foot 04/11/2015   C. difficile colitis 08/26/2011   Enteritis 08/25/2011   Past Medical History:  Diagnosis Date   Acid reflux    Blood transfusion    Blood transfusion without reported diagnosis    COPD (chronic obstructive pulmonary disease) (HCC)    Diabetes mellitus without complication (HCC)    Diverticulitis    Hypertension    Sickle cell trait (HCC)     Family History  Problem Relation Age of Onset   Hypertension Mother    Diabetes Mother    Diabetes type II Mother    Diabetes type II Father    Stroke Father    Lung cancer Father 78   Esophageal cancer Maternal Grandfather    Colon cancer Neg Hx    Pancreatic cancer Neg Hx    Stomach cancer Neg Hx     Past Surgical History:  Procedure Laterality Date   ABDOMINAL HYSTERECTOMY  2001   partial hysterectomy - emergency during  last C-section   BICEPT TENODESIS Right 03/19/2024   Procedure: TENODESIS, BICEPS OPEN;  Surgeon: Addie Cordella Hamilton, MD;  Location: [facility];  Service: Orthopedics;  Laterality: Right;   CESAREAN SECTION  1991; 1997; 2001   CESAREAN SECTION     COLON SURGERY  11/04/2021   FLEXIBLE SIGMOIDOSCOPY N/A 11/04/2021   Procedure: FLEXIBLE SIGMOIDOSCOPY;  Surgeon: Teresa Lonni HERO, MD;  Location: [facility];  Service: General;  Laterality: N/A;   POSTERIOR LUMBAR FUSION 2 WITH HARDWARE REMOVAL Right 03/19/2024   Procedure: ARTHROSCOPY, SHOULDER WITH DEBRIDEMENT;  Surgeon: Addie Cordella Hamilton, MD;  Location: [facility];  Service: Orthopedics;   Laterality: Right;  right shoulder arthroscopy, debridement, mini open biceps tenodesis and rotator cuff tear repair, mua   SHOULDER OPEN ROTATOR CUFF REPAIR Right 03/19/2024   Procedure: REPAIR, ROTATOR CUFF, OPEN;  Surgeon: Addie Cordella Hamilton, MD;  [facility];  Service: Orthopedics;  Laterality: Right;   SMALL INTESTINE SURGERY  11/04/2021   XI ROBOTIC ASSISTED LOWER ANTERIOR RESECTION N/A 11/04/2021   Procedure: XI ROBOTIC ASSISTED LOWER ANTERIOR RESECTION, RESECTION OF COLOVAGINAL FISTULA, BILATERAL TAP BLOCK, INJECTION OF FIREFLY;  Surgeon: Teresa Lonni HERO, MD;  Location: [facility];  Service: General;  Laterality: N/A;   Social History   Occupational History   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, nature: [employer]    Comment: [employer]  Tobacco Use   Smoking status: Some Days    Current packs/day: 0.50    Average packs/day: 0.5 packs/day for 31.0 years (15.5 ttl pk-yrs)    Types: Cigarettes   Smokeless tobacco: Never  Vaping Use   Vaping status: Never Used  Substance and Sexual Activity   Alcohol use: Yes    Alcohol/week: 1.0 standard drink of alcohol    Types: 1 Standard drinks or equivalent per week    Comment: occassionally   Drug use: Yes    Frequency: 7.0 times per week    Types: Marijuana   Sexual activity: Yes    Partners: Male    Birth control/protection: Surgical

## 2024-05-11 NOTE — Therapy (Signed)
 OUTPATIENT PHYSICAL THERAPY SHOULDER TREATMENT   Patient Name: Amanda Garner MRN: [id number] DOB:Jan 24, 1971, 53 y.o., female Today's Date: 05/11/2024  END OF SESSION:  PT End of Session - 05/11/24 0804     Visit Number 6    Number of Visits 17    Date for Recertification  06/15/24    Authorization Type [employer] OTHER    Authorization - Visit Number 5    Authorization - Number of Visits 60    PT Start Time 0800    PT Stop Time 0850    PT Time Calculation (min) 50 min              Past Medical History:  Diagnosis Date   Acid reflux    Blood transfusion    Blood transfusion without reported diagnosis    COPD (chronic obstructive pulmonary disease) (HCC)    Diabetes mellitus without complication (HCC)    Diverticulitis    Hypertension    Sickle cell trait (HCC)    Past Surgical History:  Procedure Laterality Date   ABDOMINAL HYSTERECTOMY  2001   partial hysterectomy - emergency during last C-section   BICEPT TENODESIS Right 03/19/2024   Procedure: TENODESIS, BICEPS OPEN;  Surgeon: Addie Cordella Hamilton, MD;  Location: [facility];  Service: Orthopedics;  Laterality: Right;   CESAREAN SECTION  1991; 1997; 2001   CESAREAN SECTION     COLON SURGERY  11/04/2021   FLEXIBLE SIGMOIDOSCOPY N/A 11/04/2021   Procedure: FLEXIBLE SIGMOIDOSCOPY;  Surgeon: Teresa Lonni HERO, MD;  Location: [facility];  Service: General;  Laterality: N/A;   POSTERIOR LUMBAR FUSION 2 WITH HARDWARE REMOVAL Right 03/19/2024   Procedure: ARTHROSCOPY, SHOULDER WITH DEBRIDEMENT;  Surgeon: Addie Cordella Hamilton, MD;  Location: [facility];  Service: Orthopedics;  Laterality: Right;  right shoulder arthroscopy, debridement, mini open biceps tenodesis and rotator cuff tear repair, mua   SHOULDER OPEN ROTATOR CUFF REPAIR Right 03/19/2024   Procedure: REPAIR, ROTATOR CUFF, OPEN;  Surgeon: Addie Cordella Hamilton, MD;  Location: [facility];  Service: Orthopedics;   Laterality: Right;   SMALL INTESTINE SURGERY  11/04/2021   XI ROBOTIC ASSISTED LOWER ANTERIOR RESECTION N/A 11/04/2021   Procedure: XI ROBOTIC ASSISTED LOWER ANTERIOR RESECTION, RESECTION OF COLOVAGINAL FISTULA, BILATERAL TAP BLOCK, INJECTION OF FIREFLY;  Surgeon: Teresa Lonni HERO, MD;  Location: [facility];  Service: General;  Laterality: N/A;   Patient Active Problem List   Diagnosis Date Noted   Complete tear of right rotator cuff 03/24/2024   Biceps tendonitis on right 03/24/2024   Degenerative superior labral anterior-to-posterior (SLAP) tear of right shoulder 03/24/2024   Synovitis of right shoulder 03/24/2024   COPD exacerbation (HCC) 11/21/2023   Prolonged QT interval 11/21/2023   Lactic acidosis 11/21/2023   Thyroid  nodule 11/21/2023   Type 2 diabetes mellitus (HCC) 11/05/2021   GERD (gastroesophageal reflux disease) 11/05/2021   S/P laparoscopic-assisted sigmoidectomy 11/04/2021   Abnormal CT scan 08/05/2021   Hx of diverticulitis of colon 08/05/2021   Colovaginal fistula 07/03/2021   Acquired palmar and plantar hyperkeratosis 01/25/2021   Hyperglycemia 02/01/2020   Crohn's disease of large intestine with abscess (HCC) 01/31/2020   Acute diverticulitis 01/09/2020   Diverticulitis of large intestine with abscess without bleeding 06/07/2017   Abscess of sigmoid colon due to diverticulitis 05/24/2017   Tobacco dependence 05/11/2017   Essential hypertension 05/11/2017   Reflux esophagitis 05/11/2017   Tinea pedis of left foot 04/11/2015   C. difficile colitis 08/26/2011   Enteritis 08/25/2011  PCP: Delbert Clam, MD   REFERRING PROVIDER: Shirly Carlin CROME, PA-C   REFERRING DIAG: [phone] (ICD-10-CM) - S/P right rotator cuff repair   THERAPY DIAG:  Acute pain of right shoulder  Muscle weakness (generalized)  Rationale for Evaluation and Treatment: Rehabilitation  ONSET DATE: 03/19/24  SUBJECTIVE:                                                                                                                                                                                       SUBJECTIVE STATEMENT: I fell face first taking the trash out. I tried to land on my hands and I hit my face. My shoulder pain has been elevated. I see MD today for follow Up.   EVAL: Pt reports her R shoulder is sore, but is improving since surgery. Pt notes the sling has been Dced, but to can wear in public as needed. Initially injured fer R shoulder during a fall backwards.  Hand dominance: Right  PERTINENT HISTORY: See above, COPD, DM2  PAIN:  Are you having pain? Yes: NPRS scale: 6-7/10 Pain location: anterior R shoulder Pain description: throb, ache, sharp Aggravating factors: When pain meds wear off Relieving factors: Rest  PRECAUTIONS: Shoulder no lifting  RED FLAGS: None   WEIGHT BEARING RESTRICTIONS: Yes no pulling or pushing  FALLS:  Has patient fallen in last 6 months? 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  PATIENT SURVEYS:  Quick Dash: 84%  Minimally Clinically Important Difference (MCID): 15-20 points  COGNITION: Overall cognitive status: Within functional limits for tasks assessed     SENSATION: WFL  POSTURE: Forward head and rounded shoulders  UPPER EXTREMITY ROM:   Passive ROM Right eval Left eval Rt 04/25/24 Rt 05/11/24  Shoulder flexion 90  110d 120  Shoulder extension      Shoulder abduction 75   80 pain  Shoulder adduction      Shoulder internal rotation      Shoulder external rotation 40  45d 45  Elbow flexion      Elbow extension      Wrist flexion      Wrist extension      Wrist ulnar deviation       Wrist radial deviation      Wrist pronation      Wrist supination      (Blank rows =  not tested)  UPPER EXTREMITY MMT:  NT MMT Right eval Left eval  Shoulder flexion    Shoulder extension    Shoulder abduction    Shoulder adduction    Shoulder internal rotation    Shoulder external rotation    Middle trapezius    Lower trapezius    Elbow flexion    Elbow extension    Wrist flexion    Wrist extension    Wrist ulnar deviation    Wrist radial deviation    Wrist pronation    Wrist supination    Grip strength (lbs)    (Blank rows = not tested)  SHOULDER SPECIAL TESTS: NT  JOINT MOBILITY TESTING:  WNLs  PALPATION:  TTP to the L peri-GH area                                                                                                                       TREATMENT DATE:  [facility] Adult PT Treatment:                                                DATE: 05/11/24 Therapeutic Exercise: Supine Shoulder chest press dowel 2x10  Pullover with dowel x 10 Supine shoulder ER AAROM with dowel  Inclined chest press with dowel 10 x 2  Seated pulley with cues to avoid shoulder hike.  Shoulder flexion walk backs from counter for PROM Manual Therapy: PROM for shoulder flexion, ER, and abd  Modalities: Cold pack x 10 mins to the R shoulder     [facility] Adult PT Treatment:                7 weeks s/p sx                                DATE: 05/08/24 Therapeutic Exercise: Seated Scapular Retraction x10 5 Supine Shoulder Flexion dowel x10 5 Supine Shoulder Scaption dowel x10 5 Supine Shoulder ER dowel x10 5 Supine Shoulder chest press dowel x10 5 45d incline shoulder flexion c dowel 2x10 Standing, ext  iso 5 sec x 10 each Manual Therapy: PROM for shoulder flexion, ER, and abd Modalities: Cold pack x 10 mins to the R shoulder  [facility] Adult PT Treatment:              6 weeks s/p Sx                                  DATE: 04/27/24 Therapeutic Exercise: Pendulum Standing scap  retract  Shoulder table flexion using foam roller  Shoulder flexion table slides  Shoulder scaption table slides Shoulder flex , ext  and ER iso 5 sec x 10 each Manual Therapy: PROM for shoulder flexion, ER, and abd  Self Care: Avoidance of shoulder hike, no lifting   PATIENT EDUCATION: Education details: Eval findings, POC, HEP, self care  Person educated: Patient Education method: Explanation, Demonstration, Tactile cues, Verbal cues, and Handouts Education comprehension: verbalized understanding, returned demonstration, verbal cues required, and tactile cues required  HOME EXERCISE PROGRAM: Access Code: [email or web address] URL: [email or web address] Date: 04/25/2024 Prepared by: Dasie Daft  Exercises - Seated Scapular Retraction  - 2 x daily - 7 x weekly - 1 sets - 10 reps - 5 hold - Seated Shoulder Flexion Towel Slide at Table Top  - 2 x daily - 7 x weekly - 1 sets - 10 reps - 5 hold - Seated Shoulder Scaption Slide at Table Top with Forearm in Neutral (Mirrored)  - 2 x daily - 7 x weekly - 1 sets - 10 reps - 5 hold - Seated Shoulder External Rotation PROM on Table (Mirrored)  - 2 x daily - 7 x weekly - 1 sets - 10 reps - 5 hold - Supine Shoulder Flexion AAROM with Hands Clasped  - 2 x daily - 7 x weekly - 1 sets - 10 reps - 5 hold - Supine Shoulder External Rotation in 45 Degrees Abduction AAROM with Dowel  - 2 x daily - 7 x weekly - 1 sets - 10 reps - 5 hold - Isometric Shoulder Flexion at Wall  - 2 x daily - 7 x weekly - 1 sets - 5 reps - 5 hold - Isometric Shoulder Extension at Wall  - 1 x daily - 7 x weekly - 1 sets - 5 reps - 5 hold  ASSESSMENT:  CLINICAL IMPRESSION: AAROM for shoulder flexion c dowel was continued at 45d incline. Ease of  completing AA movement with the R shoulder/UE is improving. PROM is improved for flexion. Began shoulder pulleys with cues to avoid shoulder hike. Pain is elevated today due to trip and fall forward onto hands and face. Sees MD  today for follow up.  Pt tolerated PT today without adverse effects. Pt will continue to benefit from skilled PT to address impairments for improved R shoulder/UE function.  OBJECTIVE IMPAIRMENTS: decreased activity tolerance, decreased ROM, decreased strength, impaired UE functional use, and pain.   ACTIVITY LIMITATIONS: carrying, lifting, bathing, toileting, dressing, reach over head, and caring for others  PARTICIPATION LIMITATIONS: meal prep, cleaning, laundry, driving, shopping, community activity, and occupation  PERSONAL FACTORS: Past/current experiences, Time since onset of injury/illness/exacerbation, and 1 comorbidity: DM2 are also affecting patient's functional outcome.   REHAB POTENTIAL: Good  CLINICAL DECISION MAKING: Evolving/moderate complexity  EVALUATION COMPLEXITY: Moderate   GOALS:  SHORT TERM GOALS: Target date: 05/13/24  Pt will be Ind in an initial HEP  Baseline: started Goal status: ONGOING  2.  Increase R shoulder PROM for flexion to 120d and ER to 65d for appropriate progress with her R shoulder [facility] rehab  Baseline: 90 and 40 respectively Goal status: ONGOING  LONG TERM GOALS: Target date: 06/15/24  Pt will be Ind in a final HEP to maintain achieved LOF Baseline:  Goal status: INITIAL  2.  Pt will demonstrate AROM for the R shoulder to flex=130, abd=120, ER to T1 and IR to L4 for needed functional ROM for daily activities Baseline:  Goal status: INITIAL  3.  Pt will report 50% or greater improvement in her r shoulder pain for improved function and QOL Baseline: 8/10 Goal status: INITIAL  5.  Pt's Quick Dash score will improve to 69% or better as indication of improved  function  Baseline: 84% Goal status: INITIAL  6.  PT will developed advanced R shoulder LTGs as appropriate for her recovery Baseline:  Goal status: INITIAL  PLAN:  PT FREQUENCY: 2x/week  PT DURATION: 8 weeks  PLANNED INTERVENTIONS: 97164- PT Re-evaluation,  97110-Therapeutic exercises, 97530- Therapeutic activity, [id number]- Neuromuscular re-education, 97535- Self Care, 02859- Manual therapy, [id number]- Electrical stimulation (unattended), 97016- Vasopneumatic device, 20560 (1-2 muscles), 20561 (3+ muscles)- Dry Needling, Patient/Family education, Taping, Joint mobilization, Cryotherapy, and Moist heat  PLAN FOR NEXT SESSION: Assess response to HEP; progress therex as indicated; use of modalities, manual therapy; and TPDN as indicated.  Harlene Persons, PTA 05/11/24 8:44 AM Phone: [phone] Fax: [phone]

## 2024-05-14 NOTE — Therapy (Signed)
 OUTPATIENT PHYSICAL THERAPY SHOULDER TREATMENT   Patient Name: Amanda Garner MRN: [id number] DOB:[birth year], 53 y.o., female Today's Date: 05/15/2024  END OF SESSION:  PT End of Session - 05/15/24 0944     Visit Number 7    Number of Visits 17    Date for Recertification  06/15/24    Authorization Type [employer] OTHER    Authorization - Visit Number 6    Authorization - Number of Visits 60    PT Start Time 0935    PT Stop Time 1025    PT Time Calculation (min) 50 min    Activity Tolerance Patient tolerated treatment well    Behavior During Therapy WFL for tasks assessed/performed               Past Medical History:  Diagnosis Date   Acid reflux    Blood transfusion    Blood transfusion without reported diagnosis    COPD (chronic obstructive pulmonary disease) (HCC)    Diabetes mellitus without complication (HCC)    Diverticulitis    Hypertension    Sickle cell trait    Past Surgical History:  Procedure Laterality Date   ABDOMINAL HYSTERECTOMY  2001   partial hysterectomy - emergency during last C-section   BICEPT TENODESIS Right 03/19/2024   Procedure: TENODESIS, BICEPS OPEN;  Surgeon: Addie Cordella Hamilton, MD;  Location: [facility];  Service: Orthopedics;  Laterality: Right;   CESAREAN SECTION  1991; 1997; 2001   CESAREAN SECTION     COLON SURGERY  11/04/2021   FLEXIBLE SIGMOIDOSCOPY N/A 11/04/2021   Procedure: FLEXIBLE SIGMOIDOSCOPY;  Surgeon: Teresa Lonni HERO, MD;  Location: [facility];  Service: General;  Laterality: N/A;   POSTERIOR LUMBAR FUSION 2 WITH HARDWARE REMOVAL Right 03/19/2024   Procedure: ARTHROSCOPY, SHOULDER WITH DEBRIDEMENT;  Surgeon: Addie Cordella Hamilton, MD;  Location: [facility];  Service: Orthopedics;  Laterality: Right;  right shoulder arthroscopy, debridement, mini open biceps tenodesis and rotator cuff tear repair, mua   SHOULDER OPEN ROTATOR CUFF REPAIR Right 03/19/2024   Procedure: REPAIR, ROTATOR  CUFF, OPEN;  Surgeon: Addie Cordella Hamilton, MD;  Location: [facility];  Service: Orthopedics;  Laterality: Right;   SMALL INTESTINE SURGERY  11/04/2021   XI ROBOTIC ASSISTED LOWER ANTERIOR RESECTION N/A 11/04/2021   Procedure: XI ROBOTIC ASSISTED LOWER ANTERIOR RESECTION, RESECTION OF COLOVAGINAL FISTULA, BILATERAL TAP BLOCK, INJECTION OF FIREFLY;  Surgeon: Teresa Lonni HERO, MD;  Location: [facility];  Service: General;  Laterality: N/A;   Patient Active Problem List   Diagnosis Date Noted   Complete tear of right rotator cuff 03/24/2024   Biceps tendonitis on right 03/24/2024   Degenerative superior labral anterior-to-posterior (SLAP) tear of right shoulder 03/24/2024   Synovitis of right shoulder 03/24/2024   COPD exacerbation (HCC) 11/21/2023   Prolonged QT interval 11/21/2023   Lactic acidosis 11/21/2023   Thyroid  nodule 11/21/2023   Type 2 diabetes mellitus (HCC) 11/05/2021   GERD (gastroesophageal reflux disease) 11/05/2021   S/P laparoscopic-assisted sigmoidectomy 11/04/2021   Abnormal CT scan 08/05/2021   Hx of diverticulitis of colon 08/05/2021   Colovaginal fistula 07/03/2021   Acquired palmar and plantar hyperkeratosis 01/25/2021   Hyperglycemia 02/01/2020   Crohn's disease of large intestine with abscess (HCC) 01/31/2020   Acute diverticulitis 01/09/2020   Diverticulitis of large intestine with abscess without bleeding 06/07/2017   Abscess of sigmoid colon due to diverticulitis 05/24/2017   Tobacco dependence 05/11/2017   Essential hypertension 05/11/2017   Reflux esophagitis  05/11/2017   Tinea pedis of left foot 04/11/2015   C. difficile colitis 08/26/2011   Enteritis 08/25/2011    PCP: Delbert Clam, MD   REFERRING PROVIDER: Shirly Carlin CROME, PA-C   REFERRING DIAG: [phone] (ICD-10-CM) - S/P right rotator cuff repair   THERAPY DIAG:  Acute pain of right shoulder  Muscle weakness (generalized)  Stiffness of right shoulder, not elsewhere  classified  Rationale for Evaluation and Treatment: Rehabilitation  ONSET DATE: 03/19/24  SUBJECTIVE:                                                                                                                                                                                      SUBJECTIVE STATEMENT: Pt reports seeing Dr. Addie on 9/19 and he reports her R shoulder looks ok following her fall.  EVAL: Pt reports her R shoulder is sore, but is improving since surgery. Pt notes the sling has been Dced, but to can wear in public as needed. Initially injured fer R shoulder during a fall backwards.  Hand dominance: Right  PERTINENT HISTORY: See above, COPD, DM2  PAIN:  Are you having pain? Yes: NPRS scale: 4-5/10 Pain location: anterior R shoulder Pain description: throb, ache, sharp Aggravating factors: When pain meds wear off Relieving factors: Rest  PRECAUTIONS: Shoulder no lifting  05/11/24: Per visit with Dr. Addie- OK for strengthening and ROM 3x/wk for 6wks  RED FLAGS: None   WEIGHT BEARING RESTRICTIONS: Yes no pulling or pushing  FALLS:  Has patient fallen in last 6 months? 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  PATIENT SURVEYS:  Quick Dash: 84%  Minimally Clinically Important Difference (MCID): 15-20 points  COGNITION: Overall cognitive status: Within functional limits for tasks assessed     SENSATION: WFL  POSTURE: Forward head and rounded shoulders  UPPER EXTREMITY ROM:   Passive ROM Right eval Left eval Rt 04/25/24 Rt 05/11/24  Shoulder flexion 90  110d 120  Shoulder extension      Shoulder abduction 75   80 pain  Shoulder  adduction      Shoulder internal rotation      Shoulder external rotation 40  45d 45  Elbow flexion      Elbow extension      Wrist flexion      Wrist extension      Wrist ulnar deviation  Wrist radial deviation      Wrist pronation      Wrist supination      (Blank rows = not tested)  UPPER EXTREMITY MMT:  NT MMT Right eval Left eval  Shoulder flexion    Shoulder extension    Shoulder abduction    Shoulder adduction    Shoulder internal rotation    Shoulder external rotation    Middle trapezius    Lower trapezius    Elbow flexion    Elbow extension    Wrist flexion    Wrist extension    Wrist ulnar deviation    Wrist radial deviation    Wrist pronation    Wrist supination    Grip strength (lbs)    (Blank rows = not tested)  SHOULDER SPECIAL TESTS: NT  JOINT MOBILITY TESTING:  WNLs  PALPATION:  TTP to the L peri-GH area                                                                                                                       TREATMENT DATE:  [facility] Adult PT Treatment:                  Week 8 s/p sx                             DATE: 04/2324 Therapeutic Exercise: Seated pulley for flexion and scaption with cues to avoid shoulder hike.  Supine Shoulder chest press dowel 2x10  Pullover with dowel x 10 Supine shoulder ER AAROM with dowel  Inclined chest press with dowel 10 x 2  R shoulder isometrics for flexion, ext, abd, IR, ER x5 10. Verbal cueing for contraction intensity as tolerated Shoulder rows with YTB 2x10 Updated HEP Modalities: Cold pack x 10 mins to the R shoulder  [facility] Adult PT Treatment:                                                DATE: 05/11/24 Therapeutic Exercise: Supine Shoulder chest press dowel 2x10  Pullover with dowel x 10 Supine shoulder ER AAROM with dowel  Inclined chest press with dowel 10 x 2  Seated pulley with cues to avoid shoulder hike.  Shoulder flexion walk backs from counter for PROM Manual Therapy: PROM  for shoulder flexion, ER, and abd  Modalities: Cold pack x 10 mins to the R shoulder  [facility] Adult PT Treatment:                7 weeks s/p sx                                DATE: 05/08/24 Therapeutic Exercise: Seated Scapular Retraction x10 5 Supine Shoulder Flexion dowel x10 5  Supine Shoulder Scaption dowel x10 5 Supine Shoulder ER dowel x10 5 Supine Shoulder chest press dowel x10 5 45d incline shoulder flexion c dowel 2x10 Standing, ext  iso 5 sec x 10 each Manual Therapy: PROM for shoulder flexion, ER, and abd Modalities: Cold pack x 10 mins to the R shoulder  PATIENT EDUCATION: Education details: Eval findings, POC, HEP, self care  Person educated: Patient Education method: Explanation, Demonstration, Tactile cues, Verbal cues, and Handouts Education comprehension: verbalized understanding, returned demonstration, verbal cues required, and tactile cues required  HOME EXERCISE PROGRAM: Access Code: [email or web address] URL: [email or web address] Date: 05/15/2024 Prepared by: Dasie Daft  Exercises - Seated Scapular Retraction  - 2 x daily - 7 x weekly - 1 sets - 10 reps - 5 hold - Seated Shoulder Flexion Towel Slide at Table Top  - 2 x daily - 7 x weekly - 1 sets - 10 reps - 5 hold - Seated Shoulder Scaption Slide at Table Top with Forearm in Neutral (Mirrored)  - 2 x daily - 7 x weekly - 1 sets - 10 reps - 5 hold - Seated Shoulder External Rotation PROM on Table (Mirrored)  - 2 x daily - 7 x weekly - 1 sets - 10 reps - 5 hold - Supine Shoulder Flexion AAROM with Hands Clasped  - 2 x daily - 7 x weekly - 1 sets - 10 reps - 5 hold - Supine Shoulder External Rotation in 45 Degrees Abduction AAROM with Dowel  - 2 x daily - 7 x weekly - 1 sets - 10 reps - 5 hold - Isometric Shoulder Flexion at Wall  - 2 x daily - 7 x weekly - 1 sets - 5 reps - 5 hold - Isometric Shoulder Extension at Wall  - 1 x daily - 7 x weekly - 1 sets - 5 reps - 5 hold - Isometric Shoulder Abduction  at Wall  - 1 x daily - 7 x weekly - 1 sets - 10 reps - 10 hold - Standing Isometric Shoulder Internal Rotation at Doorway (Mirrored)  - 1 x daily - 7 x weekly - 1 sets - 10 reps - 10 hold - Standing Isometric Shoulder External Rotation with Doorway (Mirrored)  - 1 x daily - 7 x weekly - 1 sets - 10 reps - 10 hold - Standing Shoulder Row with Anchored Resistance  - 1 x daily - 7 x weekly - 2 sets - 10 reps - 10 hold  ASSESSMENT:  CLINICAL IMPRESSION: Isometric strengthening exs were progressed today to include the deltiod and RC muscles. Verbal cueing was provided for managing intensity of contraction to as tolerated. Isotonic strengthening was initiated for scapular retraction/posterior chain strengthening. HEP was updated. Pt tolerated prescribed exs in PT today without adverse effects.   OBJECTIVE IMPAIRMENTS: decreased activity tolerance, decreased ROM, decreased strength, impaired UE functional use, and pain.   ACTIVITY LIMITATIONS: carrying, lifting, bathing, toileting, dressing, reach over head, and caring for others  PARTICIPATION LIMITATIONS: meal prep, cleaning, laundry, driving, shopping, community activity, and occupation  PERSONAL FACTORS: Past/current experiences, Time since onset of injury/illness/exacerbation, and 1 comorbidity: DM2 are also affecting patient's functional outcome.   REHAB POTENTIAL: Good  CLINICAL DECISION MAKING: Evolving/moderate complexity  EVALUATION COMPLEXITY: Moderate   GOALS:  SHORT TERM GOALS: Target date: 05/13/24  Pt will be Ind in an initial HEP  Baseline: started Goal status: MET  2.  Increase R shoulder PROM for flexion to 120d and ER to 65d for appropriate progress  with her R shoulder RC rehab  Baseline: 90 and 40 respectively 05/11/24: flexion 120, ER 45  Goal status: Partially met  LONG TERM GOALS: Target date: 06/15/24  Pt will be Ind in a final HEP to maintain achieved LOF Baseline:  Goal status: INITIAL  2.  Pt will  demonstrate AROM for the R shoulder to flex=130, abd=120, ER to T1 and IR to L4 for needed functional ROM for daily activities Baseline:  Goal status: INITIAL  3.  Pt will report 50% or greater improvement in her r shoulder pain for improved function and QOL Baseline: 8/10 Goal status: INITIAL  5.  Pt's Quick Dash score will improve to 69% or better as indication of improved function  Baseline: 84% Goal status: INITIAL  6.  PT will developed advanced R shoulder LTGs as appropriate for her recovery Baseline:  Goal status: INITIAL  PLAN:  PT FREQUENCY: 2x/week  PT DURATION: 8 weeks  PLANNED INTERVENTIONS: 97164- PT Re-evaluation, 97110-Therapeutic exercises, 97530- Therapeutic activity, [id number]- Neuromuscular re-education, 97535- Self Care, [id number]- Manual therapy, [id number]- Electrical stimulation (unattended), 97016- Vasopneumatic device, 20560 (1-2 muscles), 20561 (3+ muscles)- Dry Needling, Patient/Family education, Taping, Joint mobilization, Cryotherapy, and Moist heat  PLAN FOR NEXT SESSION: Assess response to HEP; progress therex as indicated; use of modalities, manual therapy; and TPDN as indicated.  Kayen Grabel MS, PT 05/15/24 1:30 PM

## 2024-05-15 ENCOUNTER — Ambulatory Visit

## 2024-05-15 ENCOUNTER — Telehealth: Payer: Self-pay | Admitting: Orthopedic Surgery

## 2024-05-15 DIAGNOSIS — M25511 Pain in right shoulder: Secondary | ICD-10-CM

## 2024-05-15 DIAGNOSIS — M6281 Muscle weakness (generalized): Secondary | ICD-10-CM

## 2024-05-15 DIAGNOSIS — M25611 Stiffness of right shoulder, not elsewhere classified: Secondary | ICD-10-CM

## 2024-05-15 NOTE — Telephone Encounter (Signed)
 Received call from patient stating that [facility] is requesting MD & PT notes and form to be updated to reflect prescriptions given to pt. Form updated and notes faxed [phone].

## 2024-05-16 NOTE — Therapy (Signed)
 OUTPATIENT PHYSICAL THERAPY SHOULDER TREATMENT   Patient Name: Amanda Garner MRN: [id number] DOB:13-Jun-1971, 53 y.o., female Today's Date: 05/17/2024  END OF SESSION:  PT End of Session - 05/17/24 0940     Visit Number 8    Number of Visits 17    Date for Recertification  06/15/24    Authorization Type [employer] OTHER    Authorization - Visit Number 7    Authorization - Number of Visits 60    PT Start Time [phone]    PT Stop Time 1027    PT Time Calculation (min) 48 min    Activity Tolerance Patient tolerated treatment well    Behavior During Therapy WFL for tasks assessed/performed                Past Medical History:  Diagnosis Date   Acid reflux    Blood transfusion    Blood transfusion without reported diagnosis    COPD (chronic obstructive pulmonary disease) (HCC)    Diabetes mellitus without complication (HCC)    Diverticulitis    Hypertension    Sickle cell trait    Past Surgical History:  Procedure Laterality Date   ABDOMINAL HYSTERECTOMY  2001   partial hysterectomy - emergency during last C-section   BICEPT TENODESIS Right 03/19/2024   Procedure: TENODESIS, BICEPS OPEN;  Surgeon: Addie Cordella Hamilton, MD;  Location: [facility];  Service: Orthopedics;  Laterality: Right;   CESAREAN SECTION  1991; 1997; 2001   CESAREAN SECTION     COLON SURGERY  11/04/2021   FLEXIBLE SIGMOIDOSCOPY N/A 11/04/2021   Procedure: FLEXIBLE SIGMOIDOSCOPY;  Surgeon: Teresa Lonni HERO, MD;  Location: [facility];  Service: General;  Laterality: N/A;   POSTERIOR LUMBAR FUSION 2 WITH HARDWARE REMOVAL Right 03/19/2024   Procedure: ARTHROSCOPY, SHOULDER WITH DEBRIDEMENT;  Surgeon: Addie Cordella Hamilton, MD;  Location: [facility];  Service: Orthopedics;  Laterality: Right;  right shoulder arthroscopy, debridement, mini open biceps tenodesis and rotator cuff tear repair, mua   SHOULDER OPEN ROTATOR CUFF REPAIR Right 03/19/2024   Procedure: REPAIR, ROTATOR  CUFF, OPEN;  Surgeon: Addie Cordella Hamilton, MD;  Location: [facility];  Service: Orthopedics;  Laterality: Right;   SMALL INTESTINE SURGERY  11/04/2021   XI ROBOTIC ASSISTED LOWER ANTERIOR RESECTION N/A 11/04/2021   Procedure: XI ROBOTIC ASSISTED LOWER ANTERIOR RESECTION, RESECTION OF COLOVAGINAL FISTULA, BILATERAL TAP BLOCK, INJECTION OF FIREFLY;  Surgeon: Teresa Lonni HERO, MD;  Location: [facility];  Service: General;  Laterality: N/A;   Patient Active Problem List   Diagnosis Date Noted   Complete tear of right rotator cuff 03/24/2024   Biceps tendonitis on right 03/24/2024   Degenerative superior labral anterior-to-posterior (SLAP) tear of right shoulder 03/24/2024   Synovitis of right shoulder 03/24/2024   COPD exacerbation (HCC) 11/21/2023   Prolonged QT interval 11/21/2023   Lactic acidosis 11/21/2023   Thyroid  nodule 11/21/2023   Type 2 diabetes mellitus (HCC) 11/05/2021   GERD (gastroesophageal reflux disease) 11/05/2021   S/P laparoscopic-assisted sigmoidectomy 11/04/2021   Abnormal CT scan 08/05/2021   Hx of diverticulitis of colon 08/05/2021   Colovaginal fistula 07/03/2021   Acquired palmar and plantar hyperkeratosis 01/25/2021   Hyperglycemia 02/01/2020   Crohn's disease of large intestine with abscess (HCC) 01/31/2020   Acute diverticulitis 01/09/2020   Diverticulitis of large intestine with abscess without bleeding 06/07/2017   Abscess of sigmoid colon due to diverticulitis 05/24/2017   Tobacco dependence 05/11/2017   Essential hypertension 05/11/2017   Reflux  esophagitis 05/11/2017   Tinea pedis of left foot 04/11/2015   C. difficile colitis 08/26/2011   Enteritis 08/25/2011    PCP: Delbert Clam, MD   REFERRING PROVIDER: Shirly Carlin CROME, PA-C   REFERRING DIAG: [phone] (ICD-10-CM) - S/P right rotator cuff repair   THERAPY DIAG:  Acute pain of right shoulder  Muscle weakness (generalized)  Stiffness of right shoulder, not elsewhere  classified  Rationale for Evaluation and Treatment: Rehabilitation  ONSET DATE: 03/19/24  SUBJECTIVE:                                                                                                                                                                                      SUBJECTIVE STATEMENT: Pt reports being able to move her R shoulder a little better. Pt notes she is using cold packs more often which has been helpful with managing the pain.  EVAL: Pt reports her R shoulder is sore, but is improving since surgery. Pt notes the sling has been Dced, but to can wear in public as needed. Initially injured fer R shoulder during a fall backwards.  Hand dominance: Right  PERTINENT HISTORY: See above, COPD, DM2  PAIN:  Are you having pain? Yes: NPRS scale: 3/10 c pain medication Pain location: anterior R shoulder Pain description: throb, ache, sharp Aggravating factors: When pain meds wear off Relieving factors: Rest  PRECAUTIONS: Shoulder no lifting  05/11/24: Per visit with Dr. Addie- OK for strengthening and ROM 3x/wk for 6wks  RED FLAGS: None   WEIGHT BEARING RESTRICTIONS: Yes no pulling or pushing  FALLS:  Has patient fallen in last 6 months? 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  PATIENT SURVEYS:  Quick Dash: 84%  Minimally Clinically Important Difference (MCID): 15-20 points  COGNITION: Overall cognitive status: Within functional limits for tasks assessed     SENSATION: WFL  POSTURE: Forward head and rounded shoulders  UPPER EXTREMITY ROM:   Passive ROM Right eval Left eval Rt 04/25/24 Rt 05/11/24 Rt 05/17/24  Shoulder flexion  90  110d 120 120 p  Shoulder extension       Shoulder abduction 75   80 pain   Shoulder adduction       Shoulder internal rotation       Shoulder external rotation 40  45d 45 45 p  Elbow flexion  Elbow extension       Wrist flexion       Wrist extension       Wrist ulnar deviation       Wrist radial deviation       Wrist pronation       Wrist supination       (Blank rows = not tested)  UPPER EXTREMITY MMT:  NT MMT Right eval Left eval  Shoulder flexion    Shoulder extension    Shoulder abduction    Shoulder adduction    Shoulder internal rotation    Shoulder external rotation    Middle trapezius    Lower trapezius    Elbow flexion    Elbow extension    Wrist flexion    Wrist extension    Wrist ulnar deviation    Wrist radial deviation    Wrist pronation    Wrist supination    Grip strength (lbs)    (Blank rows = not tested)  SHOULDER SPECIAL TESTS: NT  JOINT MOBILITY TESTING:  WNLs  PALPATION:  TTP to the L peri-GH area                                                                                                                       TREATMENT DATE:  [facility] Adult PT Treatment:                  Week 8 s/p sx                              DATE: 05/17/24 Therapeutic Exercise: Seated pulley for flexion and scaption with cues to avoid shoulder hike.  Supine Shoulder chest press dowel 2x10  Pullover with dowel x 10 Supine shoulder ER AAROM with dowel  Supine chest press with dowel x10  Supine shoulder flexion x10 S/L shoulder ER x10 Seated shoulder post rolls Shoulder rows with YTB 2x10 Modalities: Cold pack x 10 mins to the R shoulder Manual Therapy: R GH mobs grade 2  PROM to the R GH and scapula  [facility] Adult PT Treatment:                  Week 8 s/p sx                             DATE: 04/2324 Therapeutic Exercise: Seated pulley for flexion and scaption with cues to avoid shoulder hike.  Supine Shoulder chest press dowel 2x10  Pullover with dowel x  10 Supine shoulder ER AAROM with dowel  Inclined chest press with dowel 10 x 2  R shoulder isometrics for flexion, ext, abd, IR, ER x5 10. Verbal cueing for contraction intensity as tolerated Shoulder rows with YTB 2x10 Updated HEP Modalities: Cold pack x 10 mins to the R shoulder  [facility] Adult PT Treatment:  DATE: 05/11/24 Therapeutic Exercise: Supine Shoulder chest press dowel 2x10  Pullover with dowel x 10 Supine shoulder ER AAROM with dowel  Inclined chest press with dowel 10 x 2  Seated pulley with cues to avoid shoulder hike.  Shoulder flexion walk backs from counter for PROM Manual Therapy: PROM for shoulder flexion, ER, and abd  Modalities: Cold pack x 10 mins to the R shoulder   PATIENT EDUCATION: Education details: Eval findings, POC, HEP, self care  Person educated: Patient Education method: Explanation, Demonstration, Tactile cues, Verbal cues, and Handouts Education comprehension: verbalized understanding, returned demonstration, verbal cues required, and tactile cues required  HOME EXERCISE PROGRAM: Access Code: [email or web address] URL: [email or web address] Date: 05/15/2024 Prepared by: Dasie Daft  Exercises - Seated Scapular Retraction  - 2 x daily - 7 x weekly - 1 sets - 10 reps - 5 hold - Seated Shoulder Flexion Towel Slide at Table Top  - 2 x daily - 7 x weekly - 1 sets - 10 reps - 5 hold - Seated Shoulder Scaption Slide at Table Top with Forearm in Neutral (Mirrored)  - 2 x daily - 7 x weekly - 1 sets - 10 reps - 5 hold - Seated Shoulder External Rotation PROM on Table (Mirrored)  - 2 x daily - 7 x weekly - 1 sets - 10 reps - 5 hold - Supine Shoulder Flexion AAROM with Hands Clasped  - 2 x daily - 7 x weekly - 1 sets - 10 reps - 5 hold - Supine Shoulder External Rotation in 45 Degrees Abduction AAROM with Dowel  - 2 x daily - 7 x weekly - 1 sets - 10 reps - 5 hold - Isometric Shoulder Flexion at Wall  - 2 x  daily - 7 x weekly - 1 sets - 5 reps - 5 hold - Isometric Shoulder Extension at Wall  - 1 x daily - 7 x weekly - 1 sets - 5 reps - 5 hold - Isometric Shoulder Abduction at Wall  - 1 x daily - 7 x weekly - 1 sets - 10 reps - 10 hold - Standing Isometric Shoulder Internal Rotation at Doorway (Mirrored)  - 1 x daily - 7 x weekly - 1 sets - 10 reps - 10 hold - Standing Isometric Shoulder External Rotation with Doorway (Mirrored)  - 1 x daily - 7 x weekly - 1 sets - 10 reps - 10 hold - Standing Shoulder Row with Anchored Resistance  - 1 x daily - 7 x weekly - 2 sets - 10 reps - 10 hold  ASSESSMENT:  CLINICAL IMPRESSION: Pt reports R scapular discomfort with passive and AAROM for shoulder flexion. PT addressed R scapular mobility and R shoulder flexion stayed the same as last week at 120d. Pt tolerated progression of exs for AROM for light strengthening for flexion in supine and ER in S/L. Pt will continue to benefit from skilled PT to address impairments for improved function   OBJECTIVE IMPAIRMENTS: decreased activity tolerance, decreased ROM, decreased strength, impaired UE functional use, and pain.   ACTIVITY LIMITATIONS: carrying, lifting, bathing, toileting, dressing, reach over head, and caring for others  PARTICIPATION LIMITATIONS: meal prep, cleaning, laundry, driving, shopping, community activity, and occupation  PERSONAL FACTORS: Past/current experiences, Time since onset of injury/illness/exacerbation, and 1 comorbidity: DM2 are also affecting patient's functional outcome.   REHAB POTENTIAL: Good  CLINICAL DECISION MAKING: Evolving/moderate complexity  EVALUATION COMPLEXITY: Moderate   GOALS:  SHORT TERM GOALS: Target date: 05/13/24  Pt will  be Ind in an initial HEP  Baseline: started Goal status: MET  2.  Increase R shoulder PROM for flexion to 120d and ER to 65d for appropriate progress with her R shoulder [facility] rehab  Baseline: 90 and 40 respectively 05/11/24: flexion 120, ER  45  Goal status: Partially met  LONG TERM GOALS: Target date: 06/15/24  Pt will be Ind in a final HEP to maintain achieved LOF Baseline:  Goal status: INITIAL  2.  Pt will demonstrate AROM for the R shoulder to flex=130, abd=120, ER to T1 and IR to L4 for needed functional ROM for daily activities Baseline:  Goal status: INITIAL  3.  Pt will report 50% or greater improvement in her r shoulder pain for improved function and QOL Baseline: 8/10 Goal status: INITIAL  5.  Pt's Quick Dash score will improve to 69% or better as indication of improved function  Baseline: 84% Goal status: INITIAL  6.  PT will developed advanced R shoulder LTGs as appropriate for her recovery Baseline:  Goal status: INITIAL  PLAN:  PT FREQUENCY: 2x/week  PT DURATION: 8 weeks  PLANNED INTERVENTIONS: 97164- PT Re-evaluation, 97110-Therapeutic exercises, 97530- Therapeutic activity, [id number]- Neuromuscular re-education, 97535- Self Care, [id number]- Manual therapy, [id number]- Electrical stimulation (unattended), 97016- Vasopneumatic device, 20560 (1-2 muscles), 20561 (3+ muscles)- Dry Needling, Patient/Family education, Taping, Joint mobilization, Cryotherapy, and Moist heat  PLAN FOR NEXT SESSION: Assess response to HEP; progress therex as indicated; use of modalities, manual therapy; and TPDN as indicated.  Aarini Slee MS, PT 05/17/24 9:54 PM

## 2024-05-17 ENCOUNTER — Ambulatory Visit

## 2024-05-17 DIAGNOSIS — M25511 Pain in right shoulder: Secondary | ICD-10-CM

## 2024-05-17 DIAGNOSIS — M25611 Stiffness of right shoulder, not elsewhere classified: Secondary | ICD-10-CM

## 2024-05-17 DIAGNOSIS — M6281 Muscle weakness (generalized): Secondary | ICD-10-CM

## 2024-05-22 NOTE — Therapy (Signed)
 OUTPATIENT PHYSICAL THERAPY SHOULDER TREATMENT   Patient Name: Amanda Garner MRN: [id number] DOB:12-Sep-1970, 53 y.o., female Today's Date: 05/22/2024  END OF SESSION:          Past Medical History:  Diagnosis Date   Acid reflux    Blood transfusion    Blood transfusion without reported diagnosis    COPD (chronic obstructive pulmonary disease) (HCC)    Diabetes mellitus without complication (HCC)    Diverticulitis    Hypertension    Sickle cell trait    Past Surgical History:  Procedure Laterality Date   ABDOMINAL HYSTERECTOMY  2001   partial hysterectomy - emergency during last C-section   BICEPT TENODESIS Right 03/19/2024   Procedure: TENODESIS, BICEPS OPEN;  Surgeon: Addie Cordella Hamilton, MD;  Location: [facility];  Service: Orthopedics;  Laterality: Right;   CESAREAN SECTION  1991; 1997; 2001   CESAREAN SECTION     COLON SURGERY  11/04/2021   FLEXIBLE SIGMOIDOSCOPY N/A 11/04/2021   Procedure: FLEXIBLE SIGMOIDOSCOPY;  Surgeon: Teresa Lonni HERO, MD;  Location: [facility];  Service: General;  Laterality: N/A;   POSTERIOR LUMBAR FUSION 2 WITH HARDWARE REMOVAL Right 03/19/2024   Procedure: ARTHROSCOPY, SHOULDER WITH DEBRIDEMENT;  Surgeon: Addie Cordella Hamilton, MD;  Location: [facility];  Service: Orthopedics;  Laterality: Right;  right shoulder arthroscopy, debridement, mini open biceps tenodesis and rotator cuff tear repair, mua   SHOULDER OPEN ROTATOR CUFF REPAIR Right 03/19/2024   Procedure: REPAIR, ROTATOR CUFF, OPEN;  Surgeon: Addie Cordella Hamilton, MD;  Location: [facility];  Service: Orthopedics;  Laterality: Right;   SMALL INTESTINE SURGERY  11/04/2021   XI ROBOTIC ASSISTED LOWER ANTERIOR RESECTION N/A 11/04/2021   Procedure: XI ROBOTIC ASSISTED LOWER ANTERIOR RESECTION, RESECTION OF COLOVAGINAL FISTULA, BILATERAL TAP BLOCK, INJECTION OF FIREFLY;  Surgeon: Teresa Lonni HERO, MD;  Location: [facility];  Service: General;   Laterality: N/A;   Patient Active Problem List   Diagnosis Date Noted   Complete tear of right rotator cuff 03/24/2024   Biceps tendonitis on right 03/24/2024   Degenerative superior labral anterior-to-posterior (SLAP) tear of right shoulder 03/24/2024   Synovitis of right shoulder 03/24/2024   COPD exacerbation (HCC) 11/21/2023   Prolonged QT interval 11/21/2023   Lactic acidosis 11/21/2023   Thyroid  nodule 11/21/2023   Type 2 diabetes mellitus (HCC) 11/05/2021   GERD (gastroesophageal reflux disease) 11/05/2021   S/P laparoscopic-assisted sigmoidectomy 11/04/2021   Abnormal CT scan 08/05/2021   Hx of diverticulitis of colon 08/05/2021   Colovaginal fistula 07/03/2021   Acquired palmar and plantar hyperkeratosis 01/25/2021   Hyperglycemia 02/01/2020   Crohn's disease of large intestine with abscess (HCC) 01/31/2020   Acute diverticulitis 01/09/2020   Diverticulitis of large intestine with abscess without bleeding 06/07/2017   Abscess of sigmoid colon due to diverticulitis 05/24/2017   Tobacco dependence 05/11/2017   Essential hypertension 05/11/2017   Reflux esophagitis 05/11/2017   Tinea pedis of left foot 04/11/2015   C. difficile colitis 08/26/2011   Enteritis 08/25/2011    PCP: Delbert Clam, MD   REFERRING PROVIDER: Shirly Carlin CROME, PA-C   REFERRING DIAG: [phone] (ICD-10-CM) - S/P right rotator cuff repair   THERAPY DIAG:  No diagnosis found.  Rationale for Evaluation and Treatment: Rehabilitation  ONSET DATE: 03/19/24  SUBJECTIVE:  SUBJECTIVE STATEMENT: Pt reports being able to move her R shoulder a little better. Pt notes she is using cold packs more often which has been helpful with managing the pain.  EVAL: Pt reports her R shoulder is sore, but is improving since surgery.  Pt notes the sling has been Dced, but to can wear in public as needed. Initially injured fer R shoulder during a fall backwards.  Hand dominance: Right  PERTINENT HISTORY: See above, COPD, DM2  PAIN:  Are you having pain? Yes: NPRS scale: 3/10 c pain medication Pain location: anterior R shoulder Pain description: throb, ache, sharp Aggravating factors: When pain meds wear off Relieving factors: Rest  PRECAUTIONS: Shoulder no lifting  05/11/24: Per visit with Dr. Addie- OK for strengthening and ROM 3x/wk for 6wks  RED FLAGS: None   WEIGHT BEARING RESTRICTIONS: Yes no pulling or pushing  FALLS:  Has patient fallen in last 6 months? 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  PATIENT SURVEYS:  Quick Dash: 84%  Minimally Clinically Important Difference (MCID): 15-20 points  COGNITION: Overall cognitive status: Within functional limits for tasks assessed     SENSATION: WFL  POSTURE: Forward head and rounded shoulders  UPPER EXTREMITY ROM:   Passive ROM Right eval Left eval Rt 04/25/24 Rt 05/11/24 Rt 05/17/24  Shoulder flexion 90  110d 120 120 p  Shoulder extension       Shoulder abduction 75   80 pain   Shoulder adduction       Shoulder internal rotation       Shoulder external rotation 40  45d 45 45 p  Elbow flexion       Elbow extension       Wrist flexion       Wrist extension       Wrist ulnar deviation       Wrist radial deviation       Wrist pronation       Wrist supination       (Blank rows = not tested)  UPPER EXTREMITY MMT:  NT MMT Right eval Left eval  Shoulder flexion    Shoulder extension    Shoulder abduction    Shoulder  adduction    Shoulder internal rotation    Shoulder external rotation    Middle trapezius    Lower trapezius    Elbow flexion    Elbow extension    Wrist flexion    Wrist extension    Wrist ulnar deviation    Wrist radial deviation    Wrist pronation    Wrist supination    Grip strength (lbs)    (Blank rows = not tested)  SHOULDER SPECIAL TESTS: NT  JOINT MOBILITY TESTING:  WNLs  PALPATION:  TTP to the L peri-GH area  TREATMENT DATE:  [facility] Adult PT Treatment:                9 wks s/p sx                                DATE: 05/23/24 Therapeutic Exercise: Seated pulley for flexion and scaption with cues to avoid shoulder hike.  Supine Shoulder chest press dowel 2x10  Pullover with dowel x 10 Supine shoulder ER AAROM with dowel  Supine chest press with dowel x10  Supine shoulder flexion x10 S/L shoulder ER x10 Seated shoulder post rolls Shoulder rows with YTB 2x10 Modalities: Cold pack x 10 mins to the R shoulder Manual Therapy: R GH mobs grade 2  PROM to the R GH and scapula Therapeutic Exercise: *** Manual Therapy: *** Neuromuscular re-ed: *** Therapeutic Activity: *** Modalities: *** Self Care: ***  [facility] Adult PT Treatment:                  Week 8 s/p sx                              DATE: 05/17/24 Therapeutic Exercise: Seated pulley for flexion and scaption with cues to avoid shoulder hike.  Supine Shoulder chest press dowel 2x10  Pullover with dowel x 10 Supine shoulder ER AAROM with dowel  Supine chest press with dowel x10  Supine shoulder flexion x10 S/L shoulder ER x10 Seated shoulder post rolls Shoulder rows with YTB 2x10 Modalities: Cold pack x 10 mins to the R shoulder Manual Therapy: R GH mobs grade 2  PROM to the R GH and scapula  [facility] Adult PT Treatment:                  Week 8 s/p sx                             DATE:  04/2324 Therapeutic Exercise: Seated pulley for flexion and scaption with cues to avoid shoulder hike.  Supine Shoulder chest press dowel 2x10  Pullover with dowel x 10 Supine shoulder ER AAROM with dowel  Inclined chest press with dowel 10 x 2  R shoulder isometrics for flexion, ext, abd, IR, ER x5 10. Verbal cueing for contraction intensity as tolerated Shoulder rows with YTB 2x10 Updated HEP Modalities: Cold pack x 10 mins to the R shoulder   PATIENT EDUCATION: Education details: Eval findings, POC, HEP, self care  Person educated: Patient Education method: Explanation, Demonstration, Tactile cues, Verbal cues, and Handouts Education comprehension: verbalized understanding, returned demonstration, verbal cues required, and tactile cues required  HOME EXERCISE PROGRAM: Access Code: [email or web address] URL: [email or web address] Date: 05/15/2024 Prepared by: Dasie Daft  Exercises - Seated Scapular Retraction  - 2 x daily - 7 x weekly - 1 sets - 10 reps - 5 hold - Seated Shoulder Flexion Towel Slide at Table Top  - 2 x daily - 7 x weekly - 1 sets - 10 reps - 5 hold - Seated Shoulder Scaption Slide at Table Top with Forearm in Neutral (Mirrored)  - 2 x daily - 7 x weekly - 1 sets - 10 reps - 5 hold - Seated Shoulder External Rotation PROM on Table (Mirrored)  - 2 x daily - 7 x weekly - 1 sets - 10 reps - 5 hold - Supine Shoulder  Flexion AAROM with Hands Clasped  - 2 x daily - 7 x weekly - 1 sets - 10 reps - 5 hold - Supine Shoulder External Rotation in 45 Degrees Abduction AAROM with Dowel  - 2 x daily - 7 x weekly - 1 sets - 10 reps - 5 hold - Isometric Shoulder Flexion at Wall  - 2 x daily - 7 x weekly - 1 sets - 5 reps - 5 hold - Isometric Shoulder Extension at Wall  - 1 x daily - 7 x weekly - 1 sets - 5 reps - 5 hold - Isometric Shoulder Abduction at Wall  - 1 x daily - 7 x weekly - 1 sets - 10 reps - 10 hold - Standing Isometric Shoulder Internal Rotation at Doorway  (Mirrored)  - 1 x daily - 7 x weekly - 1 sets - 10 reps - 10 hold - Standing Isometric Shoulder External Rotation with Doorway (Mirrored)  - 1 x daily - 7 x weekly - 1 sets - 10 reps - 10 hold - Standing Shoulder Row with Anchored Resistance  - 1 x daily - 7 x weekly - 2 sets - 10 reps - 10 hold  ASSESSMENT:  CLINICAL IMPRESSION: Pt reports R scapular discomfort with passive and AAROM for shoulder flexion. PT addressed R scapular mobility and R shoulder flexion stayed the same as last week at 120d. Pt tolerated progression of exs for AROM for light strengthening for flexion in supine and ER in S/L. Pt will continue to benefit from skilled PT to address impairments for improved function   OBJECTIVE IMPAIRMENTS: decreased activity tolerance, decreased ROM, decreased strength, impaired UE functional use, and pain.   ACTIVITY LIMITATIONS: carrying, lifting, bathing, toileting, dressing, reach over head, and caring for others  PARTICIPATION LIMITATIONS: meal prep, cleaning, laundry, driving, shopping, community activity, and occupation  PERSONAL FACTORS: Past/current experiences, Time since onset of injury/illness/exacerbation, and 1 comorbidity: DM2 are also affecting patient's functional outcome.   REHAB POTENTIAL: Good  CLINICAL DECISION MAKING: Evolving/moderate complexity  EVALUATION COMPLEXITY: Moderate   GOALS:  SHORT TERM GOALS: Target date: 05/13/24  Pt will be Ind in an initial HEP  Baseline: started Goal status: MET  2.  Increase R shoulder PROM for flexion to 120d and ER to 65d for appropriate progress with her R shoulder RC rehab  Baseline: 90 and 40 respectively 05/11/24: flexion 120, ER 45  Goal status: Partially met  LONG TERM GOALS: Target date: 06/15/24  Pt will be Ind in a final HEP to maintain achieved LOF Baseline:  Goal status: INITIAL  2.  Pt will demonstrate AROM for the R shoulder to flex=130, abd=120, ER to T1 and IR to L4 for needed functional ROM for  daily activities Baseline:  Goal status: INITIAL  3.  Pt will report 50% or greater improvement in her r shoulder pain for improved function and QOL Baseline: 8/10 Goal status: INITIAL  5.  Pt's Quick Dash score will improve to 69% or better as indication of improved function  Baseline: 84% Goal status: INITIAL  6.  PT will developed advanced R shoulder LTGs as appropriate for her recovery Baseline:  Goal status: INITIAL  PLAN:  PT FREQUENCY: 2x/week  PT DURATION: 8 weeks  PLANNED INTERVENTIONS: 97164- PT Re-evaluation, 97110-Therapeutic exercises, 97530- Therapeutic activity, 97112- Neuromuscular re-education, 97535- Self Care, [id number]- Manual therapy, [id number]- Electrical stimulation (unattended), 97016- Vasopneumatic device, 20560 (1-2 muscles), 20561 (3+ muscles)- Dry Needling, Patient/Family education, Taping, Joint mobilization, Cryotherapy, and Moist heat  PLAN FOR NEXT SESSION: Assess response to HEP; progress therex as indicated; use of modalities, manual therapy; and TPDN as indicated.  Smiley Birr MS, PT 05/22/24 9:22 PM

## 2024-05-23 ENCOUNTER — Ambulatory Visit: Attending: Family Medicine

## 2024-05-23 ENCOUNTER — Telehealth: Payer: Self-pay | Admitting: Orthopedic Surgery

## 2024-05-23 DIAGNOSIS — Z9889 Other specified postprocedural states: Secondary | ICD-10-CM | POA: Insufficient documentation

## 2024-05-23 DIAGNOSIS — M25611 Stiffness of right shoulder, not elsewhere classified: Secondary | ICD-10-CM | POA: Diagnosis present

## 2024-05-23 DIAGNOSIS — M25511 Pain in right shoulder: Secondary | ICD-10-CM | POA: Diagnosis present

## 2024-05-23 DIAGNOSIS — M6281 Muscle weakness (generalized): Secondary | ICD-10-CM | POA: Diagnosis present

## 2024-05-23 NOTE — Telephone Encounter (Signed)
FMLA forms updated and faxed.

## 2024-05-23 NOTE — Telephone Encounter (Signed)
 Pt filled out FMLA forms and paid $20.00 payment. Pt MRN is [id number].

## 2024-05-24 NOTE — Therapy (Signed)
 OUTPATIENT PHYSICAL THERAPY SHOULDER TREATMENT   Patient Name: Amanda Garner MRN: [id number] DOB:1971/01/16, 53 y.o., female Today's Date: 05/25/2024  END OF SESSION:  PT End of Session - 05/25/24 1022     Visit Number 10    Number of Visits 17    Date for Recertification  06/15/24    Authorization Type [employer] OTHER    Authorization - Visit Number 9    Authorization - Number of Visits 60    PT Start Time 1020    PT Stop Time 1110    PT Time Calculation (min) 50 min    Activity Tolerance Patient tolerated treatment well    Behavior During Therapy WFL for tasks assessed/performed                  Past Medical History:  Diagnosis Date   Acid reflux    Blood transfusion    Blood transfusion without reported diagnosis    COPD (chronic obstructive pulmonary disease) (HCC)    Diabetes mellitus without complication (HCC)    Diverticulitis    Hypertension    Sickle cell trait    Past Surgical History:  Procedure Laterality Date   ABDOMINAL HYSTERECTOMY  2001   partial hysterectomy - emergency during last C-section   BICEPT TENODESIS Right 03/19/2024   Procedure: TENODESIS, BICEPS OPEN;  Surgeon: Addie Cordella Hamilton, MD;  Location: [facility];  Service: Orthopedics;  Laterality: Right;   CESAREAN SECTION  1991; 1997; 2001   CESAREAN SECTION     COLON SURGERY  11/04/2021   FLEXIBLE SIGMOIDOSCOPY N/A 11/04/2021   Procedure: FLEXIBLE SIGMOIDOSCOPY;  Surgeon: Teresa Lonni HERO, MD;  Location: [facility];  Service: General;  Laterality: N/A;   POSTERIOR LUMBAR FUSION 2 WITH HARDWARE REMOVAL Right 03/19/2024   Procedure: ARTHROSCOPY, SHOULDER WITH DEBRIDEMENT;  Surgeon: Addie Cordella Hamilton, MD;  Location: [facility];  Service: Orthopedics;  Laterality: Right;  right shoulder arthroscopy, debridement, mini open biceps tenodesis and rotator cuff tear repair, mua   SHOULDER OPEN ROTATOR CUFF REPAIR Right 03/19/2024   Procedure: REPAIR,  ROTATOR CUFF, OPEN;  Surgeon: Addie Cordella Hamilton, MD;  Location: [facility];  Service: Orthopedics;  Laterality: Right;   SMALL INTESTINE SURGERY  11/04/2021   XI ROBOTIC ASSISTED LOWER ANTERIOR RESECTION N/A 11/04/2021   Procedure: XI ROBOTIC ASSISTED LOWER ANTERIOR RESECTION, RESECTION OF COLOVAGINAL FISTULA, BILATERAL TAP BLOCK, INJECTION OF FIREFLY;  Surgeon: Teresa Lonni HERO, MD;  Location: [facility];  Service: General;  Laterality: N/A;   Patient Active Problem List   Diagnosis Date Noted   Complete tear of right rotator cuff 03/24/2024   Biceps tendonitis on right 03/24/2024   Degenerative superior labral anterior-to-posterior (SLAP) tear of right shoulder 03/24/2024   Synovitis of right shoulder 03/24/2024   COPD exacerbation (HCC) 11/21/2023   Prolonged QT interval 11/21/2023   Lactic acidosis 11/21/2023   Thyroid  nodule 11/21/2023   Type 2 diabetes mellitus (HCC) 11/05/2021   GERD (gastroesophageal reflux disease) 11/05/2021   S/P laparoscopic-assisted sigmoidectomy 11/04/2021   Abnormal CT scan 08/05/2021   Hx of diverticulitis of colon 08/05/2021   Colovaginal fistula 07/03/2021   Acquired palmar and plantar hyperkeratosis 01/25/2021   Hyperglycemia 02/01/2020   Crohn's disease of large intestine with abscess (HCC) 01/31/2020   Acute diverticulitis 01/09/2020   Diverticulitis of large intestine with abscess without bleeding 06/07/2017   Abscess of sigmoid colon due to diverticulitis 05/24/2017   Tobacco dependence 05/11/2017   Essential hypertension 05/11/2017  Reflux esophagitis 05/11/2017   Tinea pedis of left foot 04/11/2015   C. difficile colitis 08/26/2011   Enteritis 08/25/2011    PCP: Delbert Clam, MD   REFERRING PROVIDER: Shirly Carlin CROME, PA-C   REFERRING DIAG: [phone] (ICD-10-CM) - S/P right rotator cuff repair   THERAPY DIAG:  Acute pain of right shoulder  Muscle weakness (generalized)  Stiffness of right shoulder, not  elsewhere classified  Rationale for Evaluation and Treatment: Rehabilitation  ONSET DATE: 03/19/24  SUBJECTIVE:                                                                                                                                                                                      SUBJECTIVE STATEMENT: Pt reports her R shoulder is feeling better than the last PT session. She has been using cold packs and trying not to sleep on her R side.  EVAL: Pt reports her R shoulder is sore, but is improving since surgery. Pt notes the sling has been Dced, but to can wear in public as needed. Initially injured fer R shoulder during a fall backwards.  Hand dominance: Right  PERTINENT HISTORY: See above, COPD, DM2  PAIN:  Are you having pain? Yes: NPRS scale: 3/10 c pain medication Pain location: anterior R shoulder Pain description: throb, ache, sharp Aggravating factors: When pain meds wear off Relieving factors: Rest  PRECAUTIONS: Shoulder no lifting  05/11/24: Per visit with Dr. Addie- OK for strengthening and ROM 3x/wk for 6wks  RED FLAGS: None   WEIGHT BEARING RESTRICTIONS: Yes no pulling or pushing  FALLS:  Has patient fallen in last 6 months? 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  PATIENT SURVEYS:  Quick Dash: 84%  Minimally Clinically Important Difference (MCID): 15-20 points  COGNITION: Overall cognitive status: Within functional limits for tasks assessed     SENSATION: WFL  POSTURE: Forward head and rounded shoulders  UPPER EXTREMITY ROM:   Passive ROM Right eval Left eval Rt 04/25/24 Rt 05/11/24 Rt 05/17/24 Rt 05/25/24  Shoulder  flexion 90  110d 120 120 p 120AA  Shoulder extension        Shoulder abduction 75   80 pain    Shoulder adduction        Shoulder internal rotation        Shoulder external rotation 40  45d 45 45 p 45AA  Elbow flexion        Elbow extension        Wrist flexion        Wrist extension        Wrist ulnar deviation        Wrist radial deviation        Wrist pronation        Wrist supination        (Blank rows = not tested)  UPPER EXTREMITY MMT:  NT MMT Right eval Left eval  Shoulder flexion    Shoulder extension    Shoulder abduction    Shoulder adduction    Shoulder internal rotation    Shoulder external rotation    Middle trapezius    Lower trapezius    Elbow flexion    Elbow extension    Wrist flexion    Wrist extension    Wrist ulnar deviation    Wrist radial deviation    Wrist pronation    Wrist supination    Grip strength (lbs)    (Blank rows = not tested)  SHOULDER SPECIAL TESTS: NT  JOINT MOBILITY TESTING:  WNLs  PALPATION:  TTP to the L peri-GH area                                                                                                                       TREATMENT DATE:  [facility] Adult PT Treatment:               9 wks s/p sx                                   DATE: 05/25/24 Therapeutic Exercise: Therapeutic Exercise: Seated pulley for flexion and scaption with cues to avoid shoulder hike.  Supine Shoulder chest press dowel x10  Shoulder flexion and pullover with dowel x10 Supine shoulder ER AAROM with dowel  Supine shoulder flexion 2x10 S/L shoulder ER 2x10 S/L shoulder adb 2x10 Manual Therapy: R GH mobs grade 2  PROM to the R GH flexio and ER and scapula Modalities: Cold pack x 10 mins to the R shoulder  [facility] Adult PT Treatment:                9 wks s/p sx                                DATE: 05/23/24 Therapeutic Exercise: Seated pulley for flexion and scaption with cues to avoid shoulder hike.  Supine Shoulder chest press dowel 2x10   Pullover with dowel x 10 Supine shoulder ER AAROM with dowel  S/L shoulder ER 2x10 R shoulder isometrics for flexion, ext, abd, IR, ER x5 10. Verbal cueing for contraction intensity as tolerated Manual Therapy: R GH mobs grade 2  PROM to the R GH flexio and ER and scapula Modalities: Cold pack x 10  mins to the R shoulder Self Care: Pt is to apply cold packs to her R shoulder consistently to help further reduce swelling and pain  PATIENT EDUCATION: Education details: Eval findings, POC, HEP, self care  Person educated: Patient Education method: Explanation, Demonstration, Tactile cues, Verbal cues, and Handouts Education comprehension: verbalized understanding, returned demonstration, verbal cues required, and tactile cues required  HOME EXERCISE PROGRAM: Access Code: [email or web address] URL: [email or web address] Date: 05/15/2024 Prepared by: Dasie Daft  Exercises - Seated Scapular Retraction  - 2 x daily - 7 x weekly - 1 sets - 10 reps - 5 hold - Seated Shoulder Flexion Towel Slide at Table Top  - 2 x daily - 7 x weekly - 1 sets - 10 reps - 5 hold - Seated Shoulder Scaption Slide at Table Top with Forearm in Neutral (Mirrored)  - 2 x daily - 7 x weekly - 1 sets - 10 reps - 5 hold - Seated Shoulder External Rotation PROM on Table (Mirrored)  - 2 x daily - 7 x weekly - 1 sets - 10 reps - 5 hold - Supine Shoulder Flexion AAROM with Hands Clasped  - 2 x daily - 7 x weekly - 1 sets - 10 reps - 5 hold - Supine Shoulder External Rotation in 45 Degrees Abduction AAROM with Dowel  - 2 x daily - 7 x weekly - 1 sets - 10 reps - 5 hold - Isometric Shoulder Flexion at Wall  - 2 x daily - 7 x weekly - 1 sets - 5 reps - 5 hold - Isometric Shoulder Extension at Wall  - 1 x daily - 7 x weekly - 1 sets - 5 reps - 5 hold - Isometric Shoulder Abduction at Wall  - 1 x daily - 7 x weekly - 1 sets - 10 reps - 10 hold - Standing Isometric Shoulder Internal Rotation at Doorway (Mirrored)  - 1 x  daily - 7 x weekly - 1 sets - 10 reps - 10 hold - Standing Isometric Shoulder External Rotation with Doorway (Mirrored)  - 1 x daily - 7 x weekly - 1 sets - 10 reps - 10 hold - Standing Shoulder Row with Anchored Resistance  - 1 x daily - 7 x weekly - 2 sets - 10 reps - 10 hold  ASSESSMENT:  CLINICAL IMPRESSION: Pt was able to complete active shoulder flexion in supine today when she was not able to do so 2 days ago. Pt demonstrated good tolerance to active strengthening exs. ROM has not progressed, a hard end feel is present at the end ranges. Pt tolerated prescribed exs in PT today without adverse effects. A cold pack was aplied at the EOS for pain and swelling management. Pt will continue to benefit from skilled PT to address impairments for improved R shoulder function.    OBJECTIVE IMPAIRMENTS: decreased activity tolerance, decreased ROM, decreased strength, impaired UE functional use, and pain.   ACTIVITY LIMITATIONS: carrying, lifting, bathing, toileting, dressing, reach over head, and caring for others  PARTICIPATION LIMITATIONS: meal prep, cleaning, laundry, driving, shopping, community activity, and occupation  PERSONAL FACTORS: Past/current experiences, Time since onset of injury/illness/exacerbation, and 1 comorbidity: DM2 are also affecting patient's functional outcome.   REHAB POTENTIAL: Good  CLINICAL DECISION MAKING: Evolving/moderate complexity  EVALUATION COMPLEXITY: Moderate   GOALS:  SHORT TERM GOALS: Target date: 05/13/24  Pt will be Ind in an initial HEP  Baseline: started Goal status: MET  2.  Increase R shoulder PROM for flexion to  120d and ER to 65d for appropriate progress with her R shoulder RC rehab  Baseline: 90 and 40 respectively 05/11/24: flexion 120, ER 45  Goal status: Partially met  LONG TERM GOALS: Target date: 06/15/24  Pt will be Ind in a final HEP to maintain achieved LOF Baseline:  Goal status: INITIAL  2.  Pt will demonstrate AROM for  the R shoulder to flex=130, abd=120, ER to T1 and IR to L4 for needed functional ROM for daily activities Baseline:  Goal status: INITIAL  3.  Pt will report 50% or greater improvement in her r shoulder pain for improved function and QOL Baseline: 8/10 Goal status: INITIAL  5.  Pt's Quick Dash score will improve to 69% or better as indication of improved function  Baseline: 84% Goal status: INITIAL  6.  PT will developed advanced R shoulder LTGs as appropriate for her recovery Baseline:  Goal status: INITIAL  PLAN:  PT FREQUENCY: 2x/week  PT DURATION: 8 weeks  PLANNED INTERVENTIONS: 97164- PT Re-evaluation, 97110-Therapeutic exercises, 97530- Therapeutic activity, [id number]- Neuromuscular re-education, 97535- Self Care, [id number]- Manual therapy, [id number]- Electrical stimulation (unattended), 97016- Vasopneumatic device, 20560 (1-2 muscles), 20561 (3+ muscles)- Dry Needling, Patient/Family education, Taping, Joint mobilization, Cryotherapy, and Moist heat  PLAN FOR NEXT SESSION: Assess response to HEP; progress therex as indicated; use of modalities, manual therapy; and TPDN as indicated.  Reily Ilic MS, PT 05/25/24 12:19 PM

## 2024-05-25 ENCOUNTER — Ambulatory Visit

## 2024-05-25 DIAGNOSIS — M25511 Pain in right shoulder: Secondary | ICD-10-CM | POA: Diagnosis not present

## 2024-05-25 DIAGNOSIS — M6281 Muscle weakness (generalized): Secondary | ICD-10-CM

## 2024-05-25 DIAGNOSIS — M25611 Stiffness of right shoulder, not elsewhere classified: Secondary | ICD-10-CM

## 2024-05-28 NOTE — Therapy (Signed)
 OUTPATIENT PHYSICAL THERAPY SHOULDER TREATMENT   Patient Name: Amanda Garner MRN: [id number] DOB:May 17, 1971, 53 y.o., female Today's Date: 05/29/2024  END OF SESSION:  PT End of Session - 05/29/24 1637     Visit Number 11    Number of Visits 17    Date for Recertification  06/15/24    Authorization Type [employer] OTHER    Authorization - Visit Number 10    Authorization - Number of Visits 60    PT Start Time 1633    PT Stop Time 1725    PT Time Calculation (min) 52 min    Activity Tolerance Patient tolerated treatment well    Behavior During Therapy WFL for tasks assessed/performed                   Past Medical History:  Diagnosis Date   Acid reflux    Blood transfusion    Blood transfusion without reported diagnosis    COPD (chronic obstructive pulmonary disease) (HCC)    Diabetes mellitus without complication (HCC)    Diverticulitis    Hypertension    Sickle cell trait    Past Surgical History:  Procedure Laterality Date   ABDOMINAL HYSTERECTOMY  2001   partial hysterectomy - emergency during last C-section   BICEPT TENODESIS Right 03/19/2024   Procedure: TENODESIS, BICEPS OPEN;  Surgeon: Addie Cordella Hamilton, MD;  Location: [facility];  Service: Orthopedics;  Laterality: Right;   CESAREAN SECTION  1991; 1997; 2001   CESAREAN SECTION     COLON SURGERY  11/04/2021   FLEXIBLE SIGMOIDOSCOPY N/A 11/04/2021   Procedure: FLEXIBLE SIGMOIDOSCOPY;  Surgeon: Teresa Lonni HERO, MD;  Location: [facility];  Service: General;  Laterality: N/A;   POSTERIOR LUMBAR FUSION 2 WITH HARDWARE REMOVAL Right 03/19/2024   Procedure: ARTHROSCOPY, SHOULDER WITH DEBRIDEMENT;  Surgeon: Addie Cordella Hamilton, MD;  Location: [facility];  Service: Orthopedics;  Laterality: Right;  right shoulder arthroscopy, debridement, mini open biceps tenodesis and rotator cuff tear repair, mua   SHOULDER OPEN ROTATOR CUFF REPAIR Right 03/19/2024   Procedure: REPAIR,  ROTATOR CUFF, OPEN;  Surgeon: Addie Cordella Hamilton, MD;  Location: [facility];  Service: Orthopedics;  Laterality: Right;   SMALL INTESTINE SURGERY  11/04/2021   XI ROBOTIC ASSISTED LOWER ANTERIOR RESECTION N/A 11/04/2021   Procedure: XI ROBOTIC ASSISTED LOWER ANTERIOR RESECTION, RESECTION OF COLOVAGINAL FISTULA, BILATERAL TAP BLOCK, INJECTION OF FIREFLY;  Surgeon: Teresa Lonni HERO, MD;  Location: [facility];  Service: General;  Laterality: N/A;   Patient Active Problem List   Diagnosis Date Noted   Complete tear of right rotator cuff 03/24/2024   Biceps tendonitis on right 03/24/2024   Degenerative superior labral anterior-to-posterior (SLAP) tear of right shoulder 03/24/2024   Synovitis of right shoulder 03/24/2024   COPD exacerbation (HCC) 11/21/2023   Prolonged QT interval 11/21/2023   Lactic acidosis 11/21/2023   Thyroid  nodule 11/21/2023   Type 2 diabetes mellitus (HCC) 11/05/2021   GERD (gastroesophageal reflux disease) 11/05/2021   S/P laparoscopic-assisted sigmoidectomy 11/04/2021   Abnormal CT scan 08/05/2021   Hx of diverticulitis of colon 08/05/2021   Colovaginal fistula 07/03/2021   Acquired palmar and plantar hyperkeratosis 01/25/2021   Hyperglycemia 02/01/2020   Crohn's disease of large intestine with abscess (HCC) 01/31/2020   Acute diverticulitis 01/09/2020   Diverticulitis of large intestine with abscess without bleeding 06/07/2017   Abscess of sigmoid colon due to diverticulitis 05/24/2017   Tobacco dependence 05/11/2017   Essential hypertension 05/11/2017  Reflux esophagitis 05/11/2017   Tinea pedis of left foot 04/11/2015   C. difficile colitis 08/26/2011   Enteritis 08/25/2011    PCP: Delbert Clam, MD   REFERRING PROVIDER: Shirly Carlin CROME, PA-C   REFERRING DIAG: [phone] (ICD-10-CM) - S/P right rotator cuff repair   THERAPY DIAG:  Acute pain of right shoulder  Muscle weakness (generalized)  Stiffness of right shoulder, not  elsewhere classified  Rationale for Evaluation and Treatment: Rehabilitation  ONSET DATE: 03/19/24  SUBJECTIVE:                                                                                                                                                                                      SUBJECTIVE STATEMENT: Pt reports her R shoulder is continuing to do better. Currently, she is not experiencing R shoulder pain.  EVAL: Pt reports her R shoulder is sore, but is improving since surgery. Pt notes the sling has been Dced, but to can wear in public as needed. Initially injured fer R shoulder during a fall backwards.  Hand dominance: Right  PERTINENT HISTORY: See above, COPD, DM2  PAIN:  Are you having pain? Yes: NPRS scale: 0/10 c pain medication Pain location: anterior R shoulder Pain description: throb, ache, sharp Aggravating factors: When pain meds wear off Relieving factors: Rest  PRECAUTIONS: Shoulder no lifting  05/11/24: Per visit with Dr. Addie- OK for strengthening and ROM 3x/wk for 6wks  RED FLAGS: None   WEIGHT BEARING RESTRICTIONS: Yes no pulling or pushing  FALLS:  Has patient fallen in last 6 months? 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  PATIENT SURVEYS:  Quick Dash: 84%  Minimally Clinically Important Difference (MCID): 15-20 points  COGNITION: Overall cognitive status: Within functional limits for tasks assessed     SENSATION: WFL  POSTURE: Forward head and rounded shoulders  UPPER EXTREMITY ROM:   Passive ROM Right eval Left eval Rt 04/25/24 Rt 05/11/24 Rt 05/17/24 Rt 05/25/24 Rt 05/29/24  Shoulder flexion 90  110d 120  120 p 120AA 125AA  Shoulder extension         Shoulder abduction 75   80 pain     Shoulder adduction         Shoulder internal rotation         Shoulder external rotation 40  45d 45 45 p 45AA   Elbow  flexion         Elbow extension         Wrist flexion         Wrist extension         Wrist ulnar deviation         Wrist radial deviation         Wrist pronation         Wrist supination         (Blank rows = not tested)  UPPER EXTREMITY MMT:  NT MMT Right eval Left eval  Shoulder flexion    Shoulder extension    Shoulder abduction    Shoulder adduction    Shoulder internal rotation    Shoulder external rotation    Middle trapezius    Lower trapezius    Elbow flexion    Elbow extension    Wrist flexion    Wrist extension    Wrist ulnar deviation    Wrist radial deviation    Wrist pronation    Wrist supination    Grip strength (lbs)    (Blank rows = not tested)  SHOULDER SPECIAL TESTS: NT  JOINT MOBILITY TESTING:  WNLs  PALPATION:  TTP to the L peri-GH area                                                                                                                       TREATMENT DATE:  [facility] Adult PT Treatment:                10 wks s/p sx                                DATE: 05/29/24 Therapeutic Exercise: Therapeutic Exercise: Seated pulley for flexion and scaption Shoulder ladder x3 20 Supine Shoulder chest press dowel x5 10 Supine shoulder ER AAROM with dowel  Supine shoulder flexion 2x10 30d incline S/L shoulder ER 2x10 1# S/L shoulder adb 2x10 1# Manual Therapy: R GH mobs grade 3  PROM to the R GH flexion, ER, IR Modalities: Cold pack x 10 mins to the R shoulder  [facility] Adult PT Treatment:               9 wks s/p sx                                   DATE: 05/25/24 Therapeutic Exercise: Therapeutic Exercise: Seated pulley for flexion and scaption with cues to avoid shoulder hike.  Supine Shoulder chest press dowel x10  Shoulder flexion and  pullover with dowel x10 Supine shoulder ER AAROM with dowel  Supine shoulder flexion 2x10 S/L shoulder ER 2x10 S/L shoulder adb 2x10 Manual Therapy: R GH mobs grade 2  PROM to the R GH flexio and ER and scapula Modalities: Cold pack x 10 mins to the R shoulder  [facility]  Adult PT Treatment:                9 wks s/p sx                                DATE: 05/23/24 Therapeutic Exercise: Seated pulley for flexion and scaption with cues to avoid shoulder hike.  Supine Shoulder chest press dowel 2x10  Pullover with dowel x 10 Supine shoulder ER AAROM with dowel  S/L shoulder ER 2x10 R shoulder isometrics for flexion, ext, abd, IR, ER x5 10. Verbal cueing for contraction intensity as tolerated Manual Therapy: R GH mobs grade 2  PROM to the R GH flexio and ER and scapula Modalities: Cold pack x 10 mins to the R shoulder Self Care: Pt is to apply cold packs to her R shoulder consistently to help further reduce swelling and pain  PATIENT EDUCATION: Education details: Eval findings, POC, HEP, self care  Person educated: Patient Education method: Explanation, Demonstration, Tactile cues, Verbal cues, and Handouts Education comprehension: verbalized understanding, returned demonstration, verbal cues required, and tactile cues required  HOME EXERCISE PROGRAM: Access Code: [email or web address] URL: [email or web address] Date: 05/15/2024 Prepared by: Dasie Daft  Exercises - Seated Scapular Retraction  - 2 x daily - 7 x weekly - 1 sets - 10 reps - 5 hold - Seated Shoulder Flexion Towel Slide at Table Top  - 2 x daily - 7 x weekly - 1 sets - 10 reps - 5 hold - Seated Shoulder Scaption Slide at Table Top with Forearm in Neutral (Mirrored)  - 2 x daily - 7 x weekly - 1 sets - 10 reps - 5 hold - Seated Shoulder External Rotation PROM on Table (Mirrored)  - 2 x daily - 7 x weekly - 1 sets - 10 reps - 5 hold - Supine Shoulder Flexion AAROM with Hands Clasped  - 2 x daily - 7 x weekly - 1 sets -  10 reps - 5 hold - Supine Shoulder External Rotation in 45 Degrees Abduction AAROM with Dowel  - 2 x daily - 7 x weekly - 1 sets - 10 reps - 5 hold - Isometric Shoulder Flexion at Wall  - 2 x daily - 7 x weekly - 1 sets - 5 reps - 5 hold - Isometric Shoulder Extension at Wall  - 1 x daily - 7 x weekly - 1 sets - 5 reps - 5 hold - Isometric Shoulder Abduction at Wall  - 1 x daily - 7 x weekly - 1 sets - 10 reps - 10 hold - Standing Isometric Shoulder Internal Rotation at Doorway (Mirrored)  - 1 x daily - 7 x weekly - 1 sets - 10 reps - 10 hold - Standing Isometric Shoulder External Rotation with Doorway (Mirrored)  - 1 x daily - 7 x weekly - 1 sets - 10 reps - 10 hold - Standing Shoulder Row with Anchored Resistance  - 1 x daily - 7 x weekly - 2 sets - 10 reps - 10 hold  ASSESSMENT:  CLINICAL IMPRESSION: AAROM for flexion increased to 125d from 120d. Pt tolerated a gradual progression in strengthening with flexion completed in an inclined position and ER in S/L with 1#. Pt tolerated prescribed exs in PT today without adverse effects. A cold pack was aplied at the EOS for pain and swelling management. Pt is making appropriate progress with her R shoulder RC repair.  OBJECTIVE IMPAIRMENTS: decreased  activity tolerance, decreased ROM, decreased strength, impaired UE functional use, and pain.   ACTIVITY LIMITATIONS: carrying, lifting, bathing, toileting, dressing, reach over head, and caring for others  PARTICIPATION LIMITATIONS: meal prep, cleaning, laundry, driving, shopping, community activity, and occupation  PERSONAL FACTORS: Past/current experiences, Time since onset of injury/illness/exacerbation, and 1 comorbidity: DM2 are also affecting patient's functional outcome.   REHAB POTENTIAL: Good  CLINICAL DECISION MAKING: Evolving/moderate complexity  EVALUATION COMPLEXITY: Moderate   GOALS:  SHORT TERM GOALS: Target date: 05/13/24  Pt will be Ind in an initial HEP  Baseline:  started Goal status: MET  2.  Increase R shoulder PROM for flexion to 120d and ER to 65d for appropriate progress with her R shoulder RC rehab  Baseline: 90 and 40 respectively 05/11/24: flexion 120, ER 45  Goal status: Partially met  LONG TERM GOALS: Target date: 06/15/24  Pt will be Ind in a final HEP to maintain achieved LOF Baseline:  Goal status: INITIAL  2.  Pt will demonstrate AROM for the R shoulder to flex=130, abd=120, ER to T1 and IR to L4 for needed functional ROM for daily activities Baseline:  Goal status: INITIAL  3.  Pt will report 50% or greater improvement in her r shoulder pain for improved function and QOL Baseline: 8/10 Goal status: INITIAL  5.  Pt's Quick Dash score will improve to 69% or better as indication of improved function  Baseline: 84% Goal status: INITIAL  6.  PT will developed advanced R shoulder LTGs as appropriate for her recovery Baseline:  Goal status: INITIAL  PLAN:  PT FREQUENCY: 2x/week  PT DURATION: 8 weeks  PLANNED INTERVENTIONS: 97164- PT Re-evaluation, 97110-Therapeutic exercises, 97530- Therapeutic activity, [id number]- Neuromuscular re-education, 97535- Self Care, [id number]- Manual therapy, [id number]- Electrical stimulation (unattended), 97016- Vasopneumatic device, 20560 (1-2 muscles), 20561 (3+ muscles)- Dry Needling, Patient/Family education, Taping, Joint mobilization, Cryotherapy, and Moist heat  PLAN FOR NEXT SESSION: Assess response to HEP; progress therex as indicated; use of modalities, manual therapy; and TPDN as indicated.  Ayaan Ringle MS, PT 05/29/24 5:20 PM

## 2024-05-29 ENCOUNTER — Ambulatory Visit

## 2024-05-29 DIAGNOSIS — M6281 Muscle weakness (generalized): Secondary | ICD-10-CM

## 2024-05-29 DIAGNOSIS — M25511 Pain in right shoulder: Secondary | ICD-10-CM

## 2024-05-29 DIAGNOSIS — M25611 Stiffness of right shoulder, not elsewhere classified: Secondary | ICD-10-CM

## 2024-05-30 NOTE — Therapy (Incomplete)
 OUTPATIENT PHYSICAL THERAPY SHOULDER TREATMENT   Patient Name: Amanda Garner MRN: [id number] DOB:10/11/1970, 53 y.o., female Today's Date: 05/30/2024  END OF SESSION:             Past Medical History:  Diagnosis Date   Acid reflux    Blood transfusion    Blood transfusion without reported diagnosis    COPD (chronic obstructive pulmonary disease) (HCC)    Diabetes mellitus without complication (HCC)    Diverticulitis    Hypertension    Sickle cell trait    Past Surgical History:  Procedure Laterality Date   ABDOMINAL HYSTERECTOMY  2001   partial hysterectomy - emergency during last C-section   BICEPT TENODESIS Right 03/19/2024   Procedure: TENODESIS, BICEPS OPEN;  Surgeon: Addie Cordella Hamilton, MD;  Location: [facility];  Service: Orthopedics;  Laterality: Right;   CESAREAN SECTION  1991; 1997; 2001   CESAREAN SECTION     COLON SURGERY  11/04/2021   FLEXIBLE SIGMOIDOSCOPY N/A 11/04/2021   Procedure: FLEXIBLE SIGMOIDOSCOPY;  Surgeon: Teresa Lonni HERO, MD;  Location: [facility];  Service: General;  Laterality: N/A;   POSTERIOR LUMBAR FUSION 2 WITH HARDWARE REMOVAL Right 03/19/2024   Procedure: ARTHROSCOPY, SHOULDER WITH DEBRIDEMENT;  Surgeon: Addie Cordella Hamilton, MD;  Location: [facility];  Service: Orthopedics;  Laterality: Right;  right shoulder arthroscopy, debridement, mini open biceps tenodesis and rotator cuff tear repair, mua   SHOULDER OPEN ROTATOR CUFF REPAIR Right 03/19/2024   Procedure: REPAIR, ROTATOR CUFF, OPEN;  Surgeon: Addie Cordella Hamilton, MD;  Location: [facility];  Service: Orthopedics;  Laterality: Right;   SMALL INTESTINE SURGERY  11/04/2021   XI ROBOTIC ASSISTED LOWER ANTERIOR RESECTION N/A 11/04/2021   Procedure: XI ROBOTIC ASSISTED LOWER ANTERIOR RESECTION, RESECTION OF COLOVAGINAL FISTULA, BILATERAL TAP BLOCK, INJECTION OF FIREFLY;  Surgeon: Teresa Lonni HERO, MD;  Location: [facility];  Service: General;   Laterality: N/A;   Patient Active Problem List   Diagnosis Date Noted   Complete tear of right rotator cuff 03/24/2024   Biceps tendonitis on right 03/24/2024   Degenerative superior labral anterior-to-posterior (SLAP) tear of right shoulder 03/24/2024   Synovitis of right shoulder 03/24/2024   COPD exacerbation (HCC) 11/21/2023   Prolonged QT interval 11/21/2023   Lactic acidosis 11/21/2023   Thyroid  nodule 11/21/2023   Type 2 diabetes mellitus (HCC) 11/05/2021   GERD (gastroesophageal reflux disease) 11/05/2021   S/P laparoscopic-assisted sigmoidectomy 11/04/2021   Abnormal CT scan 08/05/2021   Hx of diverticulitis of colon 08/05/2021   Colovaginal fistula 07/03/2021   Acquired palmar and plantar hyperkeratosis 01/25/2021   Hyperglycemia 02/01/2020   Crohn's disease of large intestine with abscess (HCC) 01/31/2020   Acute diverticulitis 01/09/2020   Diverticulitis of large intestine with abscess without bleeding 06/07/2017   Abscess of sigmoid colon due to diverticulitis 05/24/2017   Tobacco dependence 05/11/2017   Essential hypertension 05/11/2017   Reflux esophagitis 05/11/2017   Tinea pedis of left foot 04/11/2015   C. difficile colitis 08/26/2011   Enteritis 08/25/2011    PCP: Delbert Clam, MD   REFERRING PROVIDER: Shirly Carlin CROME, PA-C   REFERRING DIAG: [phone] (ICD-10-CM) - S/P right rotator cuff repair   THERAPY DIAG:  No diagnosis found.  Rationale for Evaluation and Treatment: Rehabilitation  ONSET DATE: 03/19/24  SUBJECTIVE:  SUBJECTIVE STATEMENT: Pt reports her R shoulder is continuing to do better. Currently, she is not experiencing R shoulder pain.  EVAL: Pt reports her R shoulder is sore, but is improving since surgery. Pt notes the sling has been Dced, but to can  wear in public as needed. Initially injured fer R shoulder during a fall backwards.  Hand dominance: Right  PERTINENT HISTORY: See above, COPD, DM2  PAIN:  Are you having pain? Yes: NPRS scale: 0/10 c pain medication Pain location: anterior R shoulder Pain description: throb, ache, sharp Aggravating factors: When pain meds wear off Relieving factors: Rest  PRECAUTIONS: Shoulder no lifting  05/11/24: Per visit with Dr. Addie- OK for strengthening and ROM 3x/wk for 6wks  RED FLAGS: None   WEIGHT BEARING RESTRICTIONS: Yes no pulling or pushing  FALLS:  Has patient fallen in last 6 months? 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  PATIENT SURVEYS:  Quick Dash: 84%  Minimally Clinically Important Difference (MCID): 15-20 points  COGNITION: Overall cognitive status: Within functional limits for tasks assessed     SENSATION: WFL  POSTURE: Forward head and rounded shoulders  UPPER EXTREMITY ROM:   Passive ROM Right eval Left eval Rt 04/25/24 Rt 05/11/24 Rt 05/17/24 Rt 05/25/24 Rt 05/29/24  Shoulder flexion 90  110d 120 120 p 120AA 125AA  Shoulder extension         Shoulder abduction 75   80 pain     Shoulder adduction         Shoulder internal rotation         Shoulder external rotation 40  45d 45 45 p 45AA   Elbow flexion         Elbow extension         Wrist flexion         Wrist extension         Wrist ulnar deviation         Wrist radial deviation         Wrist pronation         Wrist supination         (Blank rows = not tested)  UPPER EXTREMITY MMT:  NT MMT Right eval Left eval  Shoulder flexion    Shoulder extension    Shoulder  abduction    Shoulder adduction    Shoulder internal rotation    Shoulder external rotation    Middle trapezius    Lower trapezius    Elbow flexion    Elbow extension    Wrist flexion    Wrist extension    Wrist ulnar deviation    Wrist radial deviation    Wrist pronation    Wrist supination    Grip strength (lbs)    (Blank rows = not tested)  SHOULDER SPECIAL TESTS: NT  JOINT MOBILITY TESTING:  WNLs  PALPATION:  TTP to the L peri-GH area  TREATMENT DATE:  [facility] Adult PT Treatment:             10 weeks s/p sx                                   DATE: 05/31/24 Therapeutic Exercise: Therapeutic Exercise: Seated pulley for flexion and scaption Shoulder ladder x3 20 Supine Shoulder chest press dowel x5 10 Supine shoulder ER AAROM with dowel  Supine shoulder flexion 2x10 30d incline S/L shoulder ER 2x10 1# S/L shoulder adb 2x10 1# Manual Therapy: R GH mobs grade 3  PROM to the R GH flexion, ER, IR Modalities: Cold pack x 10 mins to the R shoulder Therapeutic Exercise: *** Manual Therapy: *** Neuromuscular re-ed: *** Therapeutic Activity: *** Modalities: *** Self Care: ***  [facility] Adult PT Treatment:                10 wks s/p sx                                DATE: 05/29/24 Therapeutic Exercise: Therapeutic Exercise: Seated pulley for flexion and scaption Shoulder ladder x3 20 Supine Shoulder chest press dowel x5 10 Supine shoulder ER AAROM with dowel  Supine shoulder flexion 2x10 30d incline S/L shoulder ER 2x10 1# S/L shoulder adb 2x10 1# Manual Therapy: R GH mobs grade 3  PROM to the R GH flexion, ER, IR Modalities: Cold pack x 10 mins to the R shoulder  [facility] Adult PT Treatment:               9 wks s/p sx                                   DATE: 05/25/24 Therapeutic Exercise: Therapeutic Exercise: Seated pulley for flexion and scaption  with cues to avoid shoulder hike.  Supine Shoulder chest press dowel x10  Shoulder flexion and pullover with dowel x10 Supine shoulder ER AAROM with dowel  Supine shoulder flexion 2x10 S/L shoulder ER 2x10 S/L shoulder adb 2x10 Manual Therapy: R GH mobs grade 2  PROM to the R GH flexio and ER and scapula Modalities: Cold pack x 10 mins to the R shoulder  [facility] Adult PT Treatment:                9 wks s/p sx                                DATE: 05/23/24 Therapeutic Exercise: Seated pulley for flexion and scaption with cues to avoid shoulder hike.  Supine Shoulder chest press dowel 2x10  Pullover with dowel x 10 Supine shoulder ER AAROM with dowel  S/L shoulder ER 2x10 R shoulder isometrics for flexion, ext, abd, IR, ER x5 10. Verbal cueing for contraction intensity as tolerated Manual Therapy: R GH mobs grade 2  PROM to the R GH flexio and ER and scapula Modalities: Cold pack x 10 mins to the R shoulder Self Care: Pt is to apply cold packs to her R shoulder consistently to help further reduce swelling and pain  PATIENT EDUCATION: Education details: Eval findings, POC, HEP, self care  Person educated: Patient Education method: Explanation, Demonstration, Tactile cues, Verbal cues, and Handouts Education comprehension: verbalized  understanding, returned demonstration, verbal cues required, and tactile cues required  HOME EXERCISE PROGRAM: Access Code: [email or web address] URL: [email or web address] Date: 05/15/2024 Prepared by: Dasie Daft  Exercises - Seated Scapular Retraction  - 2 x daily - 7 x weekly - 1 sets - 10 reps - 5 hold - Seated Shoulder Flexion Towel Slide at Table Top  - 2 x daily - 7 x weekly - 1 sets - 10 reps - 5 hold - Seated Shoulder Scaption Slide at Table Top with Forearm in Neutral (Mirrored)  - 2 x daily - 7 x weekly - 1 sets - 10 reps - 5 hold - Seated Shoulder External Rotation PROM on Table (Mirrored)  - 2 x daily - 7 x weekly - 1 sets - 10 reps -  5 hold - Supine Shoulder Flexion AAROM with Hands Clasped  - 2 x daily - 7 x weekly - 1 sets - 10 reps - 5 hold - Supine Shoulder External Rotation in 45 Degrees Abduction AAROM with Dowel  - 2 x daily - 7 x weekly - 1 sets - 10 reps - 5 hold - Isometric Shoulder Flexion at Wall  - 2 x daily - 7 x weekly - 1 sets - 5 reps - 5 hold - Isometric Shoulder Extension at Wall  - 1 x daily - 7 x weekly - 1 sets - 5 reps - 5 hold - Isometric Shoulder Abduction at Wall  - 1 x daily - 7 x weekly - 1 sets - 10 reps - 10 hold - Standing Isometric Shoulder Internal Rotation at Doorway (Mirrored)  - 1 x daily - 7 x weekly - 1 sets - 10 reps - 10 hold - Standing Isometric Shoulder External Rotation with Doorway (Mirrored)  - 1 x daily - 7 x weekly - 1 sets - 10 reps - 10 hold - Standing Shoulder Row with Anchored Resistance  - 1 x daily - 7 x weekly - 2 sets - 10 reps - 10 hold  ASSESSMENT:  CLINICAL IMPRESSION: AAROM for flexion increased to 125d from 120d. Pt tolerated a gradual progression in strengthening with flexion completed in an inclined position and ER in S/L with 1#. Pt tolerated prescribed exs in PT today without adverse effects. A cold pack was aplied at the EOS for pain and swelling management. Pt is making appropriate progress with her R shoulder RC repair.  OBJECTIVE IMPAIRMENTS: decreased activity tolerance, decreased ROM, decreased strength, impaired UE functional use, and pain.   ACTIVITY LIMITATIONS: carrying, lifting, bathing, toileting, dressing, reach over head, and caring for others  PARTICIPATION LIMITATIONS: meal prep, cleaning, laundry, driving, shopping, community activity, and occupation  PERSONAL FACTORS: Past/current experiences, Time since onset of injury/illness/exacerbation, and 1 comorbidity: DM2 are also affecting patient's functional outcome.   REHAB POTENTIAL: Good  CLINICAL DECISION MAKING: Evolving/moderate complexity  EVALUATION COMPLEXITY:  Moderate   GOALS:  SHORT TERM GOALS: Target date: 05/13/24  Pt will be Ind in an initial HEP  Baseline: started Goal status: MET  2.  Increase R shoulder PROM for flexion to 120d and ER to 65d for appropriate progress with her R shoulder RC rehab  Baseline: 90 and 40 respectively 05/11/24: flexion 120, ER 45  Goal status: Partially met  LONG TERM GOALS: Target date: 06/15/24  Pt will be Ind in a final HEP to maintain achieved LOF Baseline:  Goal status: INITIAL  2.  Pt will demonstrate AROM for the R shoulder to flex=130, abd=120, ER to T1 and IR to  L4 for needed functional ROM for daily activities Baseline:  Goal status: INITIAL  3.  Pt will report 50% or greater improvement in her r shoulder pain for improved function and QOL Baseline: 8/10 Goal status: INITIAL  5.  Pt's Quick Dash score will improve to 69% or better as indication of improved function  Baseline: 84% Goal status: INITIAL  6.  PT will developed advanced R shoulder LTGs as appropriate for her recovery Baseline:  Goal status: INITIAL  PLAN:  PT FREQUENCY: 2x/week  PT DURATION: 8 weeks  PLANNED INTERVENTIONS: 97164- PT Re-evaluation, 97110-Therapeutic exercises, 97530- Therapeutic activity, [id number]- Neuromuscular re-education, 97535- Self Care, [id number]- Manual therapy, [id number]- Electrical stimulation (unattended), 97016- Vasopneumatic device, 20560 (1-2 muscles), 20561 (3+ muscles)- Dry Needling, Patient/Family education, Taping, Joint mobilization, Cryotherapy, and Moist heat  PLAN FOR NEXT SESSION: Assess response to HEP; progress therex as indicated; use of modalities, manual therapy; and TPDN as indicated.  Ramanda Paules MS, PT 05/30/24 12:13 PM

## 2024-05-31 ENCOUNTER — Ambulatory Visit

## 2024-06-05 ENCOUNTER — Ambulatory Visit

## 2024-06-05 DIAGNOSIS — M25511 Pain in right shoulder: Secondary | ICD-10-CM | POA: Diagnosis not present

## 2024-06-05 DIAGNOSIS — M6281 Muscle weakness (generalized): Secondary | ICD-10-CM

## 2024-06-05 DIAGNOSIS — M25611 Stiffness of right shoulder, not elsewhere classified: Secondary | ICD-10-CM

## 2024-06-05 NOTE — Therapy (Signed)
 OUTPATIENT PHYSICAL THERAPY SHOULDER TREATMENT   Patient Name: Amanda Garner MRN: [id number] DOB:[birth year], 53 y.o., female Today's Date: 06/05/2024  END OF SESSION:  PT End of Session - 06/05/24 0948     Visit Number 12    Number of Visits 17    Date for Recertification  06/15/24    Authorization Type [employer] OTHER    Authorization - Visit Number 11    Authorization - Number of Visits 60    PT Start Time [phone]    PT Stop Time 1024    PT Time Calculation (min) 40 min    Activity Tolerance Patient tolerated treatment well    Behavior During Therapy WFL for tasks assessed/performed                    Past Medical History:  Diagnosis Date   Acid reflux    Blood transfusion    Blood transfusion without reported diagnosis    COPD (chronic obstructive pulmonary disease) (HCC)    Diabetes mellitus without complication (HCC)    Diverticulitis    Hypertension    Sickle cell trait    Past Surgical History:  Procedure Laterality Date   ABDOMINAL HYSTERECTOMY  2001   partial hysterectomy - emergency during last C-section   BICEPT TENODESIS Right 03/19/2024   Procedure: TENODESIS, BICEPS OPEN;  Surgeon: Addie Cordella Hamilton, MD;  Location: [facility];  Service: Orthopedics;  Laterality: Right;   CESAREAN SECTION  1991; 1997; 2001   CESAREAN SECTION     COLON SURGERY  11/04/2021   FLEXIBLE SIGMOIDOSCOPY N/A 11/04/2021   Procedure: FLEXIBLE SIGMOIDOSCOPY;  Surgeon: Teresa Lonni HERO, MD;  Location: [facility];  Service: General;  Laterality: N/A;   POSTERIOR LUMBAR FUSION 2 WITH HARDWARE REMOVAL Right 03/19/2024   Procedure: ARTHROSCOPY, SHOULDER WITH DEBRIDEMENT;  Surgeon: Addie Cordella Hamilton, MD;  Location: [facility];  Service: Orthopedics;  Laterality: Right;  right shoulder arthroscopy, debridement, mini open biceps tenodesis and rotator cuff tear repair, mua   SHOULDER OPEN ROTATOR CUFF REPAIR Right 03/19/2024   Procedure:  REPAIR, ROTATOR CUFF, OPEN;  Surgeon: Addie Cordella Hamilton, MD;  Location:  SURGERY CENTER;  Service: Orthopedics;  Laterality: Right;   SMALL INTESTINE SURGERY  11/04/2021   XI ROBOTIC ASSISTED LOWER ANTERIOR RESECTION N/A 11/04/2021   Procedure: XI ROBOTIC ASSISTED LOWER ANTERIOR RESECTION, RESECTION OF COLOVAGINAL FISTULA, BILATERAL TAP BLOCK, INJECTION OF FIREFLY;  Surgeon: Teresa Lonni HERO, MD;  Location: [facility];  Service: General;  Laterality: N/A;   Patient Active Problem List   Diagnosis Date Noted   Complete tear of right rotator cuff 03/24/2024   Biceps tendonitis on right 03/24/2024   Degenerative superior labral anterior-to-posterior (SLAP) tear of right shoulder 03/24/2024   Synovitis of right shoulder 03/24/2024   COPD exacerbation (HCC) 11/21/2023   Prolonged QT interval 11/21/2023   Lactic acidosis 11/21/2023   Thyroid  nodule 11/21/2023   Type 2 diabetes mellitus (HCC) 11/05/2021   GERD (gastroesophageal reflux disease) 11/05/2021   S/P laparoscopic-assisted sigmoidectomy 11/04/2021   Abnormal CT scan 08/05/2021   Hx of diverticulitis of colon 08/05/2021   Colovaginal fistula 07/03/2021   Acquired palmar and plantar hyperkeratosis 01/25/2021   Hyperglycemia 02/01/2020   Crohn's disease of large intestine with abscess (HCC) 01/31/2020   Acute diverticulitis 01/09/2020   Diverticulitis of large intestine with abscess without bleeding 06/07/2017   Abscess of sigmoid colon due to diverticulitis 05/24/2017   Tobacco dependence 05/11/2017   Essential hypertension  05/11/2017   Reflux esophagitis 05/11/2017   Tinea pedis of left foot 04/11/2015   C. difficile colitis 08/26/2011   Enteritis 08/25/2011    PCP: Delbert Clam, MD   REFERRING PROVIDER: Shirly Carlin CROME, PA-C   REFERRING DIAG: [phone] (ICD-10-CM) - S/P right rotator cuff repair   THERAPY DIAG:  Acute pain of right shoulder  Muscle weakness (generalized)  Stiffness of right shoulder,  not elsewhere classified  Rationale for Evaluation and Treatment: Rehabilitation  ONSET DATE: 03/19/24  SUBJECTIVE:                                                                                                                                                                                      SUBJECTIVE STATEMENT: Pt reports R upper shoulder tightness/stiffness.  EVAL: Pt reports her R shoulder is sore, but is improving since surgery. Pt notes the sling has been Dced, but to can wear in public as needed. Initially injured fer R shoulder during a fall backwards.  Hand dominance: Right  PERTINENT HISTORY: See above, COPD, DM2  PAIN:  Are you having pain? Yes: NPRS scale: 0/10 c pain medication Pain location: anterior R shoulder Pain description: throb, ache, sharp Aggravating factors: When pain meds wear off Relieving factors: Rest  PRECAUTIONS: Shoulder no lifting  05/11/24: Per visit with Dr. Addie- OK for strengthening and ROM 3x/wk for 6wks  RED FLAGS: None   WEIGHT BEARING RESTRICTIONS: Yes no pulling or pushing  FALLS:  Has patient fallen in last 6 months? 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  PATIENT SURVEYS:  Quick Dash: 84%  Minimally Clinically Important Difference (MCID): 15-20 points  COGNITION: Overall cognitive status: Within functional limits for tasks assessed     SENSATION: WFL  POSTURE: Forward head and rounded shoulders  UPPER EXTREMITY ROM:   Passive ROM Right eval Left eval Rt 04/25/24 Rt 05/11/24 Rt 05/17/24 Rt 05/25/24 Rt 05/29/24  Shoulder flexion 90  110d 120 120 p 120AA 125AA  Shoulder extension          Shoulder abduction 75   80 pain     Shoulder adduction         Shoulder internal rotation         Shoulder external rotation 40  45d 45 45 p 45AA   Elbow flexion  Elbow extension         Wrist flexion         Wrist extension         Wrist ulnar deviation         Wrist radial deviation         Wrist pronation         Wrist supination         (Blank rows = not tested)  UPPER EXTREMITY MMT:  NT MMT Right eval Left eval  Shoulder flexion    Shoulder extension    Shoulder abduction    Shoulder adduction    Shoulder internal rotation    Shoulder external rotation    Middle trapezius    Lower trapezius    Elbow flexion    Elbow extension    Wrist flexion    Wrist extension    Wrist ulnar deviation    Wrist radial deviation    Wrist pronation    Wrist supination    Grip strength (lbs)    (Blank rows = not tested)  SHOULDER SPECIAL TESTS: NT  JOINT MOBILITY TESTING:  WNLs  PALPATION:  TTP to the L peri-GH area                                                                                                                       TREATMENT DATE:  [facility] Adult PT Treatment:             11 weeks s/p sx                                   DATE: 06/05/24 Therapeutic Exercise: Therapeutic Exercise: Seated pulley for flexion and scaption L cervical SB 3x10 UE Ranger 2x10 for both flexion and scaption Shoulder row 3x10 RTB Shoulder ext 3x10 RTB Shoulder IR 3x10 RTB Shoulder ER 3x10 RTB Manual Therapy: STM to the R upper shoulder c TPR Modalities: Cold pack x 10 mins to the R shoulder  [facility] Adult PT Treatment:                10 wks s/p sx                                DATE: 05/29/24 Therapeutic Exercise: Therapeutic Exercise: Seated pulley for flexion and scaption Shoulder ladder x3 20 Supine Shoulder chest press dowel x5 10 Supine shoulder ER AAROM with dowel  Supine shoulder flexion 2x10 30d incline S/L shoulder ER 2x10 1# S/L shoulder adb 2x10 1# Manual  Therapy: R GH mobs grade 3  PROM to the R GH flexion, ER, IR Modalities: Cold pack x 10 mins to the R shoulder  [facility] Adult PT Treatment:               9 wks s/p sx  DATE: 05/25/24 Therapeutic Exercise: Therapeutic Exercise: Seated pulley for flexion and scaption with cues to avoid shoulder hike.  Supine Shoulder chest press dowel x10  Shoulder flexion and pullover with dowel x10 Supine shoulder ER AAROM with dowel  Supine shoulder flexion 2x10 S/L shoulder ER 2x10 S/L shoulder adb 2x10 Manual Therapy: R GH mobs grade 2  PROM to the R GH flexio and ER and scapula Modalities: Cold pack x 10 mins to the R shoulder   PATIENT EDUCATION: Education details: Eval findings, POC, HEP, self care  Person educated: Patient Education method: Explanation, Demonstration, Tactile cues, Verbal cues, and Handouts Education comprehension: verbalized understanding, returned demonstration, verbal cues required, and tactile cues required  HOME EXERCISE PROGRAM: Access Code: [email or web address] URL: [email or web address] Date: 05/15/2024 Prepared by: Dasie Daft  Exercises - Seated Scapular Retraction  - 2 x daily - 7 x weekly - 1 sets - 10 reps - 5 hold - Seated Shoulder Flexion Towel Slide at Table Top  - 2 x daily - 7 x weekly - 1 sets - 10 reps - 5 hold - Seated Shoulder Scaption Slide at Table Top with Forearm in Neutral (Mirrored)  - 2 x daily - 7 x weekly - 1 sets - 10 reps - 5 hold - Seated Shoulder External Rotation PROM on Table (Mirrored)  - 2 x daily - 7 x weekly - 1 sets - 10 reps - 5 hold - Supine Shoulder Flexion AAROM with Hands Clasped  - 2 x daily - 7 x weekly - 1 sets - 10 reps - 5 hold - Supine Shoulder External Rotation in 45 Degrees Abduction AAROM with Dowel  - 2 x daily - 7 x weekly - 1 sets - 10 reps - 5 hold - Isometric Shoulder Flexion at Wall  - 2 x daily - 7 x weekly - 1 sets - 5 reps - 5 hold - Isometric Shoulder Extension at Wall  -  1 x daily - 7 x weekly - 1 sets - 5 reps - 5 hold - Isometric Shoulder Abduction at Wall  - 1 x daily - 7 x weekly - 1 sets - 10 reps - 10 hold - Standing Isometric Shoulder Internal Rotation at Doorway (Mirrored)  - 1 x daily - 7 x weekly - 1 sets - 10 reps - 10 hold - Standing Isometric Shoulder External Rotation with Doorway (Mirrored)  - 1 x daily - 7 x weekly - 1 sets - 10 reps - 10 hold - Standing Shoulder Row with Anchored Resistance  - 1 x daily - 7 x weekly - 2 sets - 10 reps - 10 hold  ASSESSMENT:  CLINICAL IMPRESSION: Pt arrived 13 mins late for her appt. STM c TPR to the R upper trap due to pt reporting stiffness and tightness in this area. AAROM exs were then completed f/b strengthening exs of the RC and peri-scapular muscles. Pt tolerated prescribed exs today without adverse effects.  OBJECTIVE IMPAIRMENTS: decreased activity tolerance, decreased ROM, decreased strength, impaired UE functional use, and pain.   ACTIVITY LIMITATIONS: carrying, lifting, bathing, toileting, dressing, reach over head, and caring for others  PARTICIPATION LIMITATIONS: meal prep, cleaning, laundry, driving, shopping, community activity, and occupation  PERSONAL FACTORS: Past/current experiences, Time since onset of injury/illness/exacerbation, and 1 comorbidity: DM2 are also affecting patient's functional outcome.   REHAB POTENTIAL: Good  CLINICAL DECISION MAKING: Evolving/moderate complexity  EVALUATION COMPLEXITY: Moderate   GOALS:  SHORT TERM GOALS: Target date: 05/13/24  Pt will be Ind in  an initial HEP  Baseline: started Goal status: MET  2.  Increase R shoulder PROM for flexion to 120d and ER to 65d for appropriate progress with her R shoulder RC rehab  Baseline: 90 and 40 respectively 05/11/24: flexion 120, ER 45  Goal status: Partially met  LONG TERM GOALS: Target date: 06/15/24  Pt will be Ind in a final HEP to maintain achieved LOF Baseline:  Goal status: INITIAL  2.  Pt  will demonstrate AROM for the R shoulder to flex=130, abd=120, ER to T1 and IR to L4 for needed functional ROM for daily activities Baseline:  Goal status: INITIAL  3.  Pt will report 50% or greater improvement in her r shoulder pain for improved function and QOL Baseline: 8/10 Goal status: INITIAL  5.  Pt's Quick Dash score will improve to 69% or better as indication of improved function  Baseline: 84% Goal status: INITIAL  6.  PT will developed advanced R shoulder LTGs as appropriate for her recovery Baseline:  Goal status: INITIAL  PLAN:  PT FREQUENCY: 2x/week  PT DURATION: 8 weeks  PLANNED INTERVENTIONS: 97164- PT Re-evaluation, 97110-Therapeutic exercises, 97530- Therapeutic activity, [id number]- Neuromuscular re-education, 97535- Self Care, [id number]- Manual therapy, [id number]- Electrical stimulation (unattended), 97016- Vasopneumatic device, 20560 (1-2 muscles), 20561 (3+ muscles)- Dry Needling, Patient/Family education, Taping, Joint mobilization, Cryotherapy, and Moist heat  PLAN FOR NEXT SESSION: Assess response to HEP; progress therex as indicated; use of modalities, manual therapy; and TPDN as indicated.  Terence Bart MS, PT 06/05/24 1:21 PM

## 2024-06-07 ENCOUNTER — Telehealth: Payer: Self-pay | Admitting: Orthopedic Surgery

## 2024-06-07 NOTE — Telephone Encounter (Signed)
 Received call from patient. Although her 12 weeks postop isn't before 11/1, her employer is requiring her to rtw on 10/22.  Patient is requesting a note stating lifting restrictions per his opinion, will need to take 2 15 breaks to rest op arm, no overhead reaching, patient is currently in PT 2 x week. Please Call when ready. [phone] will pickup.  Also, her next appt is 10/31, she would like to be seen sooner if possible.

## 2024-06-08 ENCOUNTER — Ambulatory Visit

## 2024-06-08 DIAGNOSIS — M25511 Pain in right shoulder: Secondary | ICD-10-CM

## 2024-06-08 DIAGNOSIS — M25611 Stiffness of right shoulder, not elsewhere classified: Secondary | ICD-10-CM

## 2024-06-08 DIAGNOSIS — M6281 Muscle weakness (generalized): Secondary | ICD-10-CM

## 2024-06-08 NOTE — Therapy (Signed)
 OUTPATIENT PHYSICAL THERAPY SHOULDER TREATMENT   Patient Name: Amanda Garner MRN: [id number] DOB:Jul 06, 1971, 53 y.o., female Today's Date: 06/08/2024  END OF SESSION:  PT End of Session - 06/08/24 0941     Visit Number 13    Number of Visits 17    Date for Recertification  06/15/24    Authorization Type [employer] OTHER    Authorization - Visit Number 12    Authorization - Number of Visits 60    PT Start Time 0935    PT Stop Time 1019    PT Time Calculation (min) 44 min    Activity Tolerance Patient tolerated treatment well    Behavior During Therapy WFL for tasks assessed/performed                     Past Medical History:  Diagnosis Date   Acid reflux    Blood transfusion    Blood transfusion without reported diagnosis    COPD (chronic obstructive pulmonary disease) (HCC)    Diabetes mellitus without complication (HCC)    Diverticulitis    Hypertension    Sickle cell trait    Past Surgical History:  Procedure Laterality Date   ABDOMINAL HYSTERECTOMY  2001   partial hysterectomy - emergency during last C-section   BICEPT TENODESIS Right 03/19/2024   Procedure: TENODESIS, BICEPS OPEN;  Surgeon: Addie Cordella Hamilton, MD;  Location: [facility];  Service: Orthopedics;  Laterality: Right;   CESAREAN SECTION  1991; 1997; 2001   CESAREAN SECTION     COLON SURGERY  11/04/2021   FLEXIBLE SIGMOIDOSCOPY N/A 11/04/2021   Procedure: FLEXIBLE SIGMOIDOSCOPY;  Surgeon: Teresa Lonni HERO, MD;  Location: [facility];  Service: General;  Laterality: N/A;   POSTERIOR LUMBAR FUSION 2 WITH HARDWARE REMOVAL Right 03/19/2024   Procedure: ARTHROSCOPY, SHOULDER WITH DEBRIDEMENT;  Surgeon: Addie Cordella Hamilton, MD;  Location: [facility];  Service: Orthopedics;  Laterality: Right;  right shoulder arthroscopy, debridement, mini open biceps tenodesis and rotator cuff tear repair, mua   SHOULDER OPEN ROTATOR CUFF REPAIR Right 03/19/2024   Procedure:  REPAIR, ROTATOR CUFF, OPEN;  Surgeon: Addie Cordella Hamilton, MD;  Location:  SURGERY CENTER;  Service: Orthopedics;  Laterality: Right;   SMALL INTESTINE SURGERY  11/04/2021   XI ROBOTIC ASSISTED LOWER ANTERIOR RESECTION N/A 11/04/2021   Procedure: XI ROBOTIC ASSISTED LOWER ANTERIOR RESECTION, RESECTION OF COLOVAGINAL FISTULA, BILATERAL TAP BLOCK, INJECTION OF FIREFLY;  Surgeon: Teresa Lonni HERO, MD;  Location: [facility];  Service: General;  Laterality: N/A;   Patient Active Problem List   Diagnosis Date Noted   Complete tear of right rotator cuff 03/24/2024   Biceps tendonitis on right 03/24/2024   Degenerative superior labral anterior-to-posterior (SLAP) tear of right shoulder 03/24/2024   Synovitis of right shoulder 03/24/2024   COPD exacerbation (HCC) 11/21/2023   Prolonged QT interval 11/21/2023   Lactic acidosis 11/21/2023   Thyroid  nodule 11/21/2023   Type 2 diabetes mellitus (HCC) 11/05/2021   GERD (gastroesophageal reflux disease) 11/05/2021   S/P laparoscopic-assisted sigmoidectomy 11/04/2021   Abnormal CT scan 08/05/2021   Hx of diverticulitis of colon 08/05/2021   Colovaginal fistula 07/03/2021   Acquired palmar and plantar hyperkeratosis 01/25/2021   Hyperglycemia 02/01/2020   Crohn's disease of large intestine with abscess (HCC) 01/31/2020   Acute diverticulitis 01/09/2020   Diverticulitis of large intestine with abscess without bleeding 06/07/2017   Abscess of sigmoid colon due to diverticulitis 05/24/2017   Tobacco dependence 05/11/2017   Essential  hypertension 05/11/2017   Reflux esophagitis 05/11/2017   Tinea pedis of left foot 04/11/2015   C. difficile colitis 08/26/2011   Enteritis 08/25/2011    PCP: Delbert Clam, MD   REFERRING PROVIDER: Shirly Carlin CROME, PA-C   REFERRING DIAG: [phone] (ICD-10-CM) - S/P right rotator cuff repair   THERAPY DIAG:  Acute pain of right shoulder  Muscle weakness (generalized)  Stiffness of right shoulder,  not elsewhere classified  Rationale for Evaluation and Treatment: Rehabilitation  ONSET DATE: 03/19/24  SUBJECTIVE:                                                                                                                                                                                      SUBJECTIVE STATEMENT: Pt reports the scar is tender, but she is not experiencing R shoulder pain  EVAL: Pt reports her R shoulder is sore, but is improving since surgery. Pt notes the sling has been Dced, but to can wear in public as needed. Initially injured fer R shoulder during a fall backwards.  Hand dominance: Right  PERTINENT HISTORY: See above, COPD, DM2  PAIN:  Are you having pain? Yes: NPRS scale: 0/10 c pain medication Pain location: anterior R shoulder Pain description: throb, ache, sharp Aggravating factors: When pain meds wear off Relieving factors: Rest  PRECAUTIONS: Shoulder no lifting  05/11/24: Per visit with Dr. Addie- OK for strengthening and ROM 3x/wk for 6wks  RED FLAGS: None   WEIGHT BEARING RESTRICTIONS: Yes no pulling or pushing  FALLS:  Has patient fallen in last 6 months? 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  PATIENT SURVEYS:  Quick Dash: 84%  Minimally Clinically Important Difference (MCID): 15-20 points  COGNITION: Overall cognitive status: Within functional limits for tasks assessed     SENSATION: WFL  POSTURE: Forward head and rounded shoulders  UPPER EXTREMITY ROM:   Passive ROM Right eval Left eval Rt 04/25/24 Rt 05/11/24 Rt 05/17/24 Rt 05/25/24 Rt 05/29/24 Rt 06/08/24  Shoulder flexion 90  110d 120 120 p 120AA  125AA 95A  Shoulder extension          Shoulder abduction 75   80 pain      Shoulder adduction          Shoulder internal rotation          Shoulder external rotation 40  45d  45 45 p 45AA    Elbow flexion          Elbow extension          Wrist flexion          Wrist extension          Wrist ulnar deviation          Wrist radial deviation          Wrist pronation          Wrist supination          (Blank rows = not tested)  UPPER EXTREMITY MMT:  NT MMT Right eval Left eval  Shoulder flexion    Shoulder extension    Shoulder abduction    Shoulder adduction    Shoulder internal rotation    Shoulder external rotation    Middle trapezius    Lower trapezius    Elbow flexion    Elbow extension    Wrist flexion    Wrist extension    Wrist ulnar deviation    Wrist radial deviation    Wrist pronation    Wrist supination    Grip strength (lbs)    (Blank rows = not tested)  SHOULDER SPECIAL TESTS: NT  JOINT MOBILITY TESTING:  WNLs  PALPATION:  TTP to the L peri-GH area                                                                                                                       TREATMENT DATE:  [facility] Adult PT Treatment:                                                DATE: 06/08/24 Therapeutic Exercise: Therapeutic Exercise: Seated pulley for flexion and scaption Wall slides x10 Ball on wall circles CW/CCW Standing bilat ER c YTB  3x10 Supine shoulder press-plus serratus 3x10 3# S/L shoulder ER 3x10 1# S/L shoulder adb 3x10 1#  [facility] Adult PT Treatment:             11 weeks s/p sx                                   DATE: 06/05/24 Therapeutic Exercise: Therapeutic Exercise: Seated pulley for flexion and scaption L cervical SB 3x10 UE Ranger 2x10 for both flexion and scaption Shoulder row 3x10 RTB Shoulder ext 3x10 RTB Shoulder IR 3x10 RTB Shoulder ER 3x10 RTB Manual Therapy: STM to the R upper shoulder c TPR Modalities: Cold pack x 10 mins to the R  shoulder  [facility] Adult PT Treatment:                10 wks s/p sx  DATE: 05/29/24 Therapeutic Exercise: Therapeutic Exercise: Seated pulley for flexion and scaption Shoulder ladder x3 20 Supine Shoulder chest press dowel x5 10 Supine shoulder ER AAROM with dowel  Supine shoulder flexion 2x10 30d incline S/L shoulder ER 2x10 1# S/L shoulder adb 2x10 1# Manual Therapy: R GH mobs grade 3  PROM to the R GH flexion, ER, IR Modalities: Cold pack x 10 mins to the R shoulder   PATIENT EDUCATION: Education details: Eval findings, POC, HEP, self care  Person educated: Patient Education method: Explanation, Demonstration, Tactile cues, Verbal cues, and Handouts Education comprehension: verbalized understanding, returned demonstration, verbal cues required, and tactile cues required  HOME EXERCISE PROGRAM: Access Code: [email or web address] URL: [email or web address] Date: 05/15/2024 Prepared by: Dasie Daft  Exercises - Seated Scapular Retraction  - 2 x daily - 7 x weekly - 1 sets - 10 reps - 5 hold - Seated Shoulder Flexion Towel Slide at Table Top  - 2 x daily - 7 x weekly - 1 sets - 10 reps - 5 hold - Seated Shoulder Scaption Slide at Table Top with Forearm in Neutral (Mirrored)  - 2 x daily - 7 x weekly - 1 sets - 10 reps - 5 hold - Seated Shoulder External Rotation PROM on Table (Mirrored)  - 2 x daily - 7 x weekly - 1 sets - 10 reps - 5 hold - Supine Shoulder Flexion AAROM with Hands Clasped  - 2 x daily - 7 x weekly - 1 sets - 10 reps - 5 hold - Supine Shoulder External Rotation in 45 Degrees Abduction AAROM with Dowel  - 2 x daily - 7 x weekly - 1 sets - 10 reps - 5 hold - Isometric Shoulder Flexion at Wall  - 2 x daily - 7 x weekly - 1 sets - 5 reps - 5 hold - Isometric Shoulder Extension at Wall  - 1 x daily - 7 x weekly - 1 sets - 5 reps - 5 hold - Isometric Shoulder Abduction at Wall  - 1 x daily - 7 x weekly - 1 sets - 10 reps - 10 hold -  Standing Isometric Shoulder Internal Rotation at Doorway (Mirrored)  - 1 x daily - 7 x weekly - 1 sets - 10 reps - 10 hold - Standing Isometric Shoulder External Rotation with Doorway (Mirrored)  - 1 x daily - 7 x weekly - 1 sets - 10 reps - 10 hold - Standing Shoulder Row with Anchored Resistance  - 1 x daily - 7 x weekly - 2 sets - 10 reps - 10 hold  ASSESSMENT:  CLINICAL IMPRESSION: Continued R shoulder rehab with more focus on strengthening of the R RC and periscapular muscles. Assessed R shoulder flexion in standing. Shoulder shrugging is present. Pt will benefit from continued PT to address strengthening, although pt is at an appropriate level of strength s/p RCR. Pt tolerated prescribed exs today without adverse effects. Pt will continue to benefit from skilled PT to address impairments for improved R shoulder function.   OBJECTIVE IMPAIRMENTS: decreased activity tolerance, decreased ROM, decreased strength, impaired UE functional use, and pain.   ACTIVITY LIMITATIONS: carrying, lifting, bathing, toileting, dressing, reach over head, and caring for others  PARTICIPATION LIMITATIONS: meal prep, cleaning, laundry, driving, shopping, community activity, and occupation  PERSONAL FACTORS: Past/current experiences, Time since onset of injury/illness/exacerbation, and 1 comorbidity: DM2 are also affecting patient's functional outcome.   REHAB POTENTIAL: Good  CLINICAL DECISION MAKING: Evolving/moderate complexity  EVALUATION COMPLEXITY: Moderate  GOALS:  SHORT TERM GOALS: Target date: 05/13/24  Pt will be Ind in an initial HEP  Baseline: started Goal status: MET  2.  Increase R shoulder PROM for flexion to 120d and ER to 65d for appropriate progress with her R shoulder RC rehab  Baseline: 90 and 40 respectively 05/11/24: flexion 120, ER 45  Goal status: Partially met  LONG TERM GOALS: Target date: 06/15/24  Pt will be Ind in a final HEP to maintain achieved LOF Baseline:  Goal  status: INITIAL  2.  Pt will demonstrate AROM for the R shoulder to flex=130, abd=120, ER to T1 and IR to L4 for needed functional ROM for daily activities Baseline:  Goal status: INITIAL  3.  Pt will report 50% or greater improvement in her r shoulder pain for improved function and QOL Baseline: 8/10 Goal status: INITIAL  5.  Pt's Quick Dash score will improve to 69% or better as indication of improved function  Baseline: 84% Goal status: INITIAL  6.  PT will developed advanced R shoulder LTGs as appropriate for her recovery Baseline:  Goal status: INITIAL  PLAN:  PT FREQUENCY: 2x/week  PT DURATION: 8 weeks  PLANNED INTERVENTIONS: 97164- PT Re-evaluation, 97110-Therapeutic exercises, 97530- Therapeutic activity, [id number]- Neuromuscular re-education, 97535- Self Care, 02859- Manual therapy, [id number]- Electrical stimulation (unattended), 97016- Vasopneumatic device, 20560 (1-2 muscles), 20561 (3+ muscles)- Dry Needling, Patient/Family education, Taping, Joint mobilization, Cryotherapy, and Moist heat  PLAN FOR NEXT SESSION: Assess response to HEP; progress therex as indicated; use of modalities, manual therapy; and TPDN as indicated.  Laurella Tull MS, PT 06/08/24 10:33 AM

## 2024-06-08 NOTE — Telephone Encounter (Signed)
 If she wants to go back to work before we see her on the 1 November then the note will say no lifting with the affected arm until her return office visit on 11 1..  If she can wait until after her appointment on 1031 then we may be able to do something different with the note restrictions.  We could also try to see her on next Monday or Wednesday which is fairly close to her 87-month postop.

## 2024-06-10 NOTE — Therapy (Unsigned)
 OUTPATIENT PHYSICAL THERAPY SHOULDER TREATMENT/Recert   Patient Name: Amanda Garner MRN: [id number] DOB:11-16-1970, 53 y.o., female Today's Date: 06/12/2024  END OF SESSION:  PT End of Session - 06/12/24 1008     Visit Number 14    Number of Visits 17    Date for Recertification  08/03/24    Authorization Type [employer] OTHER    Authorization - Visit Number 13    Authorization - Number of Visits 60    PT Start Time [phone]    PT Stop Time 1018    PT Time Calculation (min) 40 min    Activity Tolerance Patient tolerated treatment well    Behavior During Therapy WFL for tasks assessed/performed                      Past Medical History:  Diagnosis Date   Acid reflux    Blood transfusion    Blood transfusion without reported diagnosis    COPD (chronic obstructive pulmonary disease) (HCC)    Diabetes mellitus without complication (HCC)    Diverticulitis    Hypertension    Sickle cell trait    Past Surgical History:  Procedure Laterality Date   ABDOMINAL HYSTERECTOMY  2001   partial hysterectomy - emergency during last C-section   BICEPT TENODESIS Right 03/19/2024   Procedure: TENODESIS, BICEPS OPEN;  Surgeon: Addie Cordella Hamilton, MD;  Location: [facility];  Service: Orthopedics;  Laterality: Right;   CESAREAN SECTION  1991; 1997; 2001   CESAREAN SECTION     COLON SURGERY  11/04/2021   FLEXIBLE SIGMOIDOSCOPY N/A 11/04/2021   Procedure: FLEXIBLE SIGMOIDOSCOPY;  Surgeon: Teresa Lonni HERO, MD;  Location: [facility];  Service: General;  Laterality: N/A;   POSTERIOR LUMBAR FUSION 2 WITH HARDWARE REMOVAL Right 03/19/2024   Procedure: ARTHROSCOPY, SHOULDER WITH DEBRIDEMENT;  Surgeon: Addie Cordella Hamilton, MD;  Location: [facility];  Service: Orthopedics;  Laterality: Right;  right shoulder arthroscopy, debridement, mini open biceps tenodesis and rotator cuff tear repair, mua   SHOULDER OPEN ROTATOR CUFF REPAIR Right 03/19/2024    Procedure: REPAIR, ROTATOR CUFF, OPEN;  Surgeon: Addie Cordella Hamilton, MD;  Location: [facility];  Service: Orthopedics;  Laterality: Right;   SMALL INTESTINE SURGERY  11/04/2021   XI ROBOTIC ASSISTED LOWER ANTERIOR RESECTION N/A 11/04/2021   Procedure: XI ROBOTIC ASSISTED LOWER ANTERIOR RESECTION, RESECTION OF COLOVAGINAL FISTULA, BILATERAL TAP BLOCK, INJECTION OF FIREFLY;  Surgeon: Teresa Lonni HERO, MD;  Location: [facility];  Service: General;  Laterality: N/A;   Patient Active Problem List   Diagnosis Date Noted   Complete tear of right rotator cuff 03/24/2024   Biceps tendonitis on right 03/24/2024   Degenerative superior labral anterior-to-posterior (SLAP) tear of right shoulder 03/24/2024   Synovitis of right shoulder 03/24/2024   COPD exacerbation (HCC) 11/21/2023   Prolonged QT interval 11/21/2023   Lactic acidosis 11/21/2023   Thyroid  nodule 11/21/2023   Type 2 diabetes mellitus (HCC) 11/05/2021   GERD (gastroesophageal reflux disease) 11/05/2021   S/P laparoscopic-assisted sigmoidectomy 11/04/2021   Abnormal CT scan 08/05/2021   Hx of diverticulitis of colon 08/05/2021   Colovaginal fistula 07/03/2021   Acquired palmar and plantar hyperkeratosis 01/25/2021   Hyperglycemia 02/01/2020   Crohn's disease of large intestine with abscess (HCC) 01/31/2020   Acute diverticulitis 01/09/2020   Diverticulitis of large intestine with abscess without bleeding 06/07/2017   Abscess of sigmoid colon due to diverticulitis 05/24/2017   Tobacco dependence 05/11/2017  Essential hypertension 05/11/2017   Reflux esophagitis 05/11/2017   Tinea pedis of left foot 04/11/2015   C. difficile colitis 08/26/2011   Enteritis 08/25/2011    PCP: Delbert Clam, MD   REFERRING PROVIDER: Shirly Carlin CROME, PA-C   REFERRING DIAG: [phone] (ICD-10-CM) - S/P right rotator cuff repair   THERAPY DIAG:  Acute pain of right shoulder - Plan: PT plan of care cert/re-cert  Muscle weakness  (generalized) - Plan: PT plan of care cert/re-cert  Stiffness of right shoulder, not elsewhere classified - Plan: PT plan of care cert/re-cert  Rationale for Evaluation and Treatment: Rehabilitation  ONSET DATE: 03/19/24  SUBJECTIVE:                                                                                                                                                                                      SUBJECTIVE STATEMENT: Pt states she saw The PA yesterday and he wants her to work on her R shoulder ROM.  EVAL: Pt reports her R shoulder is sore, but is improving since surgery. Pt notes the sling has been Dced, but to can wear in public as needed. Initially injured fer R shoulder during a fall backwards.  Hand dominance: Right  PERTINENT HISTORY: See above, COPD, DM2  PAIN:  Are you having pain? Yes: NPRS scale: 0/10 c pain medication Pain location: anterior R shoulder Pain description: throb, ache, sharp Aggravating factors: When pain meds wear off Relieving factors: Rest  PRECAUTIONS: Shoulder no lifting  05/11/24: Per visit with Dr. Addie- OK for strengthening and ROM 3x/wk for 6wks  RED FLAGS: None   WEIGHT BEARING RESTRICTIONS: Yes no pulling or pushing  FALLS:  Has patient fallen in last 6 months? 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  PATIENT SURVEYS:  Quick Dash: 84%  Minimally Clinically Important Difference (MCID): 15-20 points  COGNITION: Overall cognitive status: Within functional limits for tasks assessed     SENSATION: WFL  POSTURE: Forward head and rounded shoulders  UPPER EXTREMITY ROM:   Passive ROM Right eval  Left eval Rt 04/25/24 Rt 05/11/24 Rt 05/17/24 Rt 05/25/24 Rt 05/29/24 Rt 06/08/24 Rt 06/12/24  Shoulder flexion 90  110d 120 120 p 120AA 125AA 95A 125AA  Shoulder extension           Shoulder abduction 75   80 pain  Shoulder adduction           Shoulder internal rotation           Shoulder external rotation 40  45d 45 45 p 45AA   45AA  Elbow flexion           Elbow extension           Wrist flexion           Wrist extension           Wrist ulnar deviation           Wrist radial deviation           Wrist pronation           Wrist supination           (Blank rows = not tested)  UPPER EXTREMITY MMT:  NT MMT Right eval Left eval  Shoulder flexion    Shoulder extension    Shoulder abduction    Shoulder adduction    Shoulder internal rotation    Shoulder external rotation    Middle trapezius    Lower trapezius    Elbow flexion    Elbow extension    Wrist flexion    Wrist extension    Wrist ulnar deviation    Wrist radial deviation    Wrist pronation    Wrist supination    Grip strength (lbs)    (Blank rows = not tested)  SHOULDER SPECIAL TESTS: NT  JOINT MOBILITY TESTING:  WNLs  PALPATION:  TTP to the L peri-GH area                                                                                                                       TREATMENT DATE:  [facility] Adult PT Treatment:          12 weeks                                      DATE: 06/12/24 Therapeutic Exercise: UBE L1 2 mins each way Shoulder ladder for flexion ROM x3 20 Wall slide for shoulder flexion x10 Door ER stretch x3 20 S/L sleeper IR stretch x20 IR RTB 3x10 Standing bilat ER c YTB  3x10 Shoulder row 3x10 RTB Shoulder ext 3x10 RTB Manual Therapy: R GH grade 3 mobs: distraction, inf glide, PA, AP  [facility] Adult PT Treatment:                                                DATE: 06/08/24 Therapeutic Exercise: Therapeutic Exercise: Seated pulley for flexion and scaption Wall slides x10 Ball  on wall circles CW/CCW Standing bilat ER c YTB  3x10 Supine shoulder press-plus serratus 3x10 3# S/L shoulder ER 3x10 1# S/L shoulder adb 3x10  1#  [facility] Adult PT Treatment:             11 weeks s/p sx                                   DATE: 06/05/24 Therapeutic Exercise: Therapeutic Exercise: Seated pulley for flexion and scaption L cervical SB 3x10 UE Ranger 2x10 for both flexion and scaption  Shoulder IR 3x10 RTB Shoulder ER 3x10 RTB Manual Therapy: STM to the R upper shoulder c TPR Modalities: Cold pack x 10 mins to the R shoulder  PATIENT EDUCATION: Education details: Eval findings, POC, HEP, self care  Person educated: Patient Education method: Explanation, Demonstration, Tactile cues, Verbal cues, and Handouts Education comprehension: verbalized understanding, returned demonstration, verbal cues required, and tactile cues required  HOME EXERCISE PROGRAM: Access Code: [email or web address] URL: [email or web address] Date: 05/15/2024 Prepared by: Dasie Daft  Exercises - Seated Scapular Retraction  - 2 x daily - 7 x weekly - 1 sets - 10 reps - 5 hold - Seated Shoulder Flexion Towel Slide at Table Top  - 2 x daily - 7 x weekly - 1 sets - 10 reps - 5 hold - Seated Shoulder Scaption Slide at Table Top with Forearm in Neutral (Mirrored)  - 2 x daily - 7 x weekly - 1 sets - 10 reps - 5 hold - Seated Shoulder External Rotation PROM on Table (Mirrored)  - 2 x daily - 7 x weekly - 1 sets - 10 reps - 5 hold - Supine Shoulder Flexion AAROM with Hands Clasped  - 2 x daily - 7 x weekly - 1 sets - 10 reps - 5 hold - Supine Shoulder External Rotation in 45 Degrees Abduction AAROM with Dowel  - 2 x daily - 7 x weekly - 1 sets - 10 reps - 5 hold - Isometric Shoulder Flexion at Wall  - 2 x daily - 7 x weekly - 1 sets - 5 reps - 5 hold - Isometric Shoulder Extension at Wall  - 1 x daily - 7 x weekly - 1 sets - 5 reps - 5 hold - Isometric Shoulder Abduction at Wall  - 1 x daily - 7 x  weekly - 1 sets - 10 reps - 10 hold - Standing Isometric Shoulder Internal Rotation at Doorway (Mirrored)  - 1 x daily - 7 x weekly - 1 sets - 10 reps - 10 hold - Standing Isometric Shoulder External Rotation with Doorway (Mirrored)  - 1 x daily - 7 x weekly - 1 sets - 10 reps - 10 hold - Standing Shoulder Row with Anchored Resistance  - 1 x daily - 7 x weekly - 2 sets - 10 reps - 10 hold  ASSESSMENT:  CLINICAL IMPRESSION: PT today focused on R shoulder ROM and for RC and periscapular strengthening. To date, the pt has made appropriate progress c R shoulder pain, ROM, and strength for the time frame following her recovery from R shoulder RCR. Will add wall slides, S/L sleeper stretch and door ER stretch to HEP, if pt reports good tolerance of these exs her next PT visit. R shoulder girdle shrugging is present c active shoulder flexion. Recommend continued PT services 2w6 to address ROM and strength deficits for appropriate functioning of the R shoulder c daily activities.   OBJECTIVE IMPAIRMENTS: decreased activity tolerance, decreased ROM, decreased strength, impaired UE functional use, and pain.   ACTIVITY LIMITATIONS:  carrying, lifting, bathing, toileting, dressing, reach over head, and caring for others  PARTICIPATION LIMITATIONS: meal prep, cleaning, laundry, driving, shopping, community activity, and occupation  PERSONAL FACTORS: Past/current experiences, Time since onset of injury/illness/exacerbation, and 1 comorbidity: DM2 are also affecting patient's functional outcome.   REHAB POTENTIAL: Good  CLINICAL DECISION MAKING: Evolving/moderate complexity  EVALUATION COMPLEXITY: Moderate   GOALS:  SHORT TERM GOALS: Target date: 05/13/24  Pt will be Ind in an initial HEP  Baseline: started Goal status: MET  2.  Increase R shoulder PROM for flexion to 120d and ER to 65d for appropriate progress with her R shoulder RC rehab  Baseline: 90 and 40 respectively 05/11/24: flexion 120, ER  45  Goal status: Partially met  LONG TERM GOALS: Target date: 08/03/24  Pt will be Ind in a final HEP to maintain achieved LOF Baseline:  Goal status: ONGOING  2.  Pt will demonstrate AROM for the R shoulder to flex=130, abd=120, ER to T1 and IR to L4 for needed functional ROM for daily activities Baseline:  06/12/24: See flow sheets Goal status: IMPROVED  3.  Pt will report 50% or greater improvement in her r shoulder pain for improved function and QOL Baseline: 8/10 06/12/24: 85% improvement in R shoulder pain Goal status: MET  5.  Pt's Quick Dash score will improve to 69% or better as indication of improved function  Baseline: 84% Goal status: ONGOING  6.  PT will developed advanced R shoulder LTGs as appropriate for her recovery Baseline:  Goal status: MET  7. Pt will be able to reach overhead c a 2# weight for improved funcitonal use of there R arm with daily activities Baseline: Unable Goal status: INITIAL as of 06/12/24  PLAN:  PT FREQUENCY: 2x/week  PT DURATION: 6 weeks  PLANNED INTERVENTIONS: 97164- PT Re-evaluation, 97110-Therapeutic exercises, 97530- Therapeutic activity, 97112- Neuromuscular re-education, 97535- Self Care, 02859- Manual therapy, G0283- Electrical stimulation (unattended), 97016- Vasopneumatic device, 20560 (1-2 muscles), 20561 (3+ muscles)- Dry Needling, Patient/Family education, Taping, Joint mobilization, Cryotherapy, and Moist heat  PLAN FOR NEXT SESSION: Assess response to HEP; progress therex as indicated; use of modalities, manual therapy; and TPDN as indicated.  Griselle Rufer MS, PT 06/12/24 1:26 PM

## 2024-06-11 ENCOUNTER — Ambulatory Visit (INDEPENDENT_AMBULATORY_CARE_PROVIDER_SITE_OTHER): Admitting: Surgical

## 2024-06-11 DIAGNOSIS — Z9889 Other specified postprocedural states: Secondary | ICD-10-CM

## 2024-06-12 ENCOUNTER — Encounter: Payer: Self-pay | Admitting: Surgical

## 2024-06-12 ENCOUNTER — Ambulatory Visit

## 2024-06-12 DIAGNOSIS — M6281 Muscle weakness (generalized): Secondary | ICD-10-CM

## 2024-06-12 DIAGNOSIS — M25611 Stiffness of right shoulder, not elsewhere classified: Secondary | ICD-10-CM

## 2024-06-12 DIAGNOSIS — M25511 Pain in right shoulder: Secondary | ICD-10-CM | POA: Diagnosis not present

## 2024-06-12 NOTE — Progress Notes (Signed)
 Post-Op Visit Note   Patient: Amanda Garner           Date of Birth: 05-08-1971           MRN: [id number] Visit Date: 06/11/2024 PCP: Delbert Clam, MD   Assessment & Plan:  Chief Complaint:  Chief Complaint  Patient presents with   Right Shoulder - Follow-up, Routine Post Op    03/19/2024 right shoulder arthroscopy, debridement, MOBT, MORCR   Visit Diagnoses: No diagnosis found.  Plan: Patient is a 53 year old female who presents s/p right shoulder arthroscopy with debridement, mini open bicep tenodesis and rotator cuff tear repair.  She is almost 12 weeks out from surgery at this point.  Pain is consistently improving.  She was taking Dilaudid  once every other day but she has run out and discontinue Dilaudid .  She feels that most of her pain is activity related at this point or if she tries to lay on her right shoulder.  She has 6 more weeks of physical therapy.  On exam, patient has 40 degrees X rotation, 90 degrees abduction, 100 degrees forward elevation passively and actively.  She has excellent rotator cuff strength of supra, infra, subscap rated 5/5.  Incisions are well-healed.  Some slight hypertrophic scar formation of the mini open incision in the anterior portal incision.  2+ radial pulse of the operative extremity.  Intact EPL, FPL, finger abduction.  Axillary nerve intact with deltoid firing.  She has no significant crepitus noted with passive motion of the shoulder.  Active motion equivalent to passive motion.  Plan at this time is return to work with some restrictions starting on 06/25/2024 which will be about 3 months out from her surgery.  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  She will occasionally have to do 45 pound lifting in her normal job but she is not ready for this.  She is okay for lifting up to 10 to 15 pounds as long as she keeps any lifting she does close to her body.  Would avoid lifting  anything over her head or out/away from her body.  If she has any significant discomfort once returning to work, we can increase her restrictions.  In the meantime, I would continue with physical therapy at [facility] where she is doing well.  I think at this point, her strength seems excellent so I would focus more on achieving more passive range of motion for her which seems to be the biggest barrier to her shoulder function at this time.  I recommended she spend 75% of her time focusing on range of motion and stretching and 25% of her time focusing on strengthening.  Plan to see her back in 6 weeks for clinical recheck and likely release at that time just to make sure she has returned to work with success.  She did have adhesive capsulitis ongoing for several months prior to her surgery so it makes sense that she would struggle somewhat with passive motion more than anything else.  Follow-Up Instructions: No follow-ups on file.   Orders:  No orders of the defined types were placed in this encounter.  No orders of the defined types were placed in this encounter.   Imaging: No results found.  PMFS History: Patient Active Problem List   Diagnosis Date Noted   Complete tear of right rotator cuff 03/24/2024   Biceps tendonitis on right 03/24/2024   Degenerative superior labral anterior-to-posterior (  SLAP) tear of right shoulder 03/24/2024   Synovitis of right shoulder 03/24/2024   COPD exacerbation (HCC) 11/21/2023   Prolonged QT interval 11/21/2023   Lactic acidosis 11/21/2023   Thyroid  nodule 11/21/2023   Type 2 diabetes mellitus (HCC) 11/05/2021   GERD (gastroesophageal reflux disease) 11/05/2021   S/P laparoscopic-assisted sigmoidectomy 11/04/2021   Abnormal CT scan 08/05/2021   Hx of diverticulitis of colon 08/05/2021   Colovaginal fistula 07/03/2021   Acquired palmar and plantar hyperkeratosis 01/25/2021   Hyperglycemia 02/01/2020   Crohn's disease of large intestine with  abscess (HCC) 01/31/2020   Acute diverticulitis 01/09/2020   Diverticulitis of large intestine with abscess without bleeding 06/07/2017   Abscess of sigmoid colon due to diverticulitis 05/24/2017   Tobacco dependence 05/11/2017   Essential hypertension 05/11/2017   Reflux esophagitis 05/11/2017   Tinea pedis of left foot 04/11/2015   C. difficile colitis 08/26/2011   Enteritis 08/25/2011   Past Medical History:  Diagnosis Date   Acid reflux    Blood transfusion    Blood transfusion without reported diagnosis    COPD (chronic obstructive pulmonary disease) (HCC)    Diabetes mellitus without complication (HCC)    Diverticulitis    Hypertension    Sickle cell trait     Family History  Problem Relation Age of Onset   Hypertension Mother    Diabetes Mother    Diabetes type II Mother    Diabetes type II Father    Stroke Father    Lung cancer Father 86   Esophageal cancer Maternal Grandfather    Colon cancer Neg Hx    Pancreatic cancer Neg Hx    Stomach cancer Neg Hx     Past Surgical History:  Procedure Laterality Date   ABDOMINAL HYSTERECTOMY  2001   partial hysterectomy - emergency during last C-section   BICEPT TENODESIS Right 03/19/2024   Procedure: TENODESIS, BICEPS OPEN;  Surgeon: Addie Cordella Hamilton, MD;  Location: [facility];  Service: Orthopedics;  Laterality: Right;   CESAREAN SECTION  1991; 1997; 2001   CESAREAN SECTION     COLON SURGERY  11/04/2021   FLEXIBLE SIGMOIDOSCOPY N/A 11/04/2021   Procedure: FLEXIBLE SIGMOIDOSCOPY;  Surgeon: Teresa Lonni HERO, MD;  Location: [facility];  Service: General;  Laterality: N/A;   POSTERIOR LUMBAR FUSION 2 WITH HARDWARE REMOVAL Right 03/19/2024   Procedure: ARTHROSCOPY, SHOULDER WITH DEBRIDEMENT;  Surgeon: Addie Cordella Hamilton, MD;  Location: [facility];  Service: Orthopedics;  Laterality: Right;  right shoulder arthroscopy, debridement, mini open biceps tenodesis and rotator cuff tear repair, mua    SHOULDER OPEN ROTATOR CUFF REPAIR Right 03/19/2024   Procedure: REPAIR, ROTATOR CUFF, OPEN;  Surgeon: Addie Cordella Hamilton, MD;  Location: [facility];  Service: Orthopedics;  Laterality: Right;   SMALL INTESTINE SURGERY  11/04/2021   XI ROBOTIC ASSISTED LOWER ANTERIOR RESECTION N/A 11/04/2021   Procedure: XI ROBOTIC ASSISTED LOWER ANTERIOR RESECTION, RESECTION OF COLOVAGINAL FISTULA, BILATERAL TAP BLOCK, INJECTION OF FIREFLY;  Surgeon: Teresa Lonni HERO, MD;  Location: [facility];  Service: General;  Laterality: N/A;   Social History   Occupational History   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, nature: [employer]    Comment: [employer]  Tobacco Use   Smoking status: Some Days    Current packs/day: 0.50    Average packs/day: 0.5 packs/day for 31.0 years (15.5 ttl pk-yrs)    Types: Cigarettes   Smokeless tobacco: Never  Vaping Use  Vaping status: Never Used  Substance and Sexual Activity   Alcohol use: Yes    Alcohol/week: 1.0 standard drink of alcohol    Types: 1 Standard drinks or equivalent per week    Comment: occassionally   Drug use: Yes    Frequency: 7.0 times per week    Types: Marijuana   Sexual activity: Yes    Partners: Male    Birth control/protection: Surgical

## 2024-06-22 ENCOUNTER — Encounter: Payer: Self-pay | Admitting: Physical Therapy

## 2024-06-22 ENCOUNTER — Ambulatory Visit: Admitting: Physical Therapy

## 2024-06-22 ENCOUNTER — Encounter: Admitting: Orthopedic Surgery

## 2024-06-22 DIAGNOSIS — M25611 Stiffness of right shoulder, not elsewhere classified: Secondary | ICD-10-CM

## 2024-06-22 DIAGNOSIS — M6281 Muscle weakness (generalized): Secondary | ICD-10-CM

## 2024-06-22 DIAGNOSIS — M25511 Pain in right shoulder: Secondary | ICD-10-CM | POA: Diagnosis not present

## 2024-06-22 NOTE — Therapy (Signed)
 OUTPATIENT PHYSICAL THERAPY SHOULDER TREATMENT   Patient Name: Amanda Garner MRN: [id number] DOB:[birth year], 53 y.o., female Today's Date: 06/22/2024  END OF SESSION:  PT End of Session - 06/22/24 1021     Visit Number 15    Number of Visits 17    Date for Recertification  08/03/24    Authorization Type [employer] OTHER    Authorization - Visit Number 14    Authorization - Number of Visits 60    PT Start Time 1019    PT Stop Time 1100    PT Time Calculation (min) 41 min                       Past Medical History:  Diagnosis Date   Acid reflux    Blood transfusion    Blood transfusion without reported diagnosis    COPD (chronic obstructive pulmonary disease) (HCC)    Diabetes mellitus without complication (HCC)    Diverticulitis    Hypertension    Sickle cell trait    Past Surgical History:  Procedure Laterality Date   ABDOMINAL HYSTERECTOMY  2001   partial hysterectomy - emergency during last C-section   BICEPT TENODESIS Right 03/19/2024   Procedure: TENODESIS, BICEPS OPEN;  Surgeon: Addie Cordella Hamilton, MD;  Location: [facility];  Service: Orthopedics;  Laterality: Right;   CESAREAN SECTION  1991; 1997; 2001   CESAREAN SECTION     COLON SURGERY  11/04/2021   FLEXIBLE SIGMOIDOSCOPY N/A 11/04/2021   Procedure: FLEXIBLE SIGMOIDOSCOPY;  Surgeon: Teresa Lonni HERO, MD;  Location: [facility];  Service: General;  Laterality: N/A;   POSTERIOR LUMBAR FUSION 2 WITH HARDWARE REMOVAL Right 03/19/2024   Procedure: ARTHROSCOPY, SHOULDER WITH DEBRIDEMENT;  Surgeon: Addie Cordella Hamilton, MD;  Location: [facility];  Service: Orthopedics;  Laterality: Right;  right shoulder arthroscopy, debridement, mini open biceps tenodesis and rotator cuff tear repair, mua   SHOULDER OPEN ROTATOR CUFF REPAIR Right 03/19/2024   Procedure: REPAIR, ROTATOR CUFF, OPEN;  Surgeon: Addie Cordella Hamilton, MD;  Location: [facility];  Service:  Orthopedics;  Laterality: Right;   SMALL INTESTINE SURGERY  11/04/2021   XI ROBOTIC ASSISTED LOWER ANTERIOR RESECTION N/A 11/04/2021   Procedure: XI ROBOTIC ASSISTED LOWER ANTERIOR RESECTION, RESECTION OF COLOVAGINAL FISTULA, BILATERAL TAP BLOCK, INJECTION OF FIREFLY;  Surgeon: Teresa Lonni HERO, MD;  Location: [facility];  Service: General;  Laterality: N/A;   Patient Active Problem List   Diagnosis Date Noted   Complete tear of right rotator cuff 03/24/2024   Biceps tendonitis on right 03/24/2024   Degenerative superior labral anterior-to-posterior (SLAP) tear of right shoulder 03/24/2024   Synovitis of right shoulder 03/24/2024   COPD exacerbation (HCC) 11/21/2023   Prolonged QT interval 11/21/2023   Lactic acidosis 11/21/2023   Thyroid  nodule 11/21/2023   Type 2 diabetes mellitus (HCC) 11/05/2021   GERD (gastroesophageal reflux disease) 11/05/2021   S/P laparoscopic-assisted sigmoidectomy 11/04/2021   Abnormal CT scan 08/05/2021   Hx of diverticulitis of colon 08/05/2021   Colovaginal fistula 07/03/2021   Acquired palmar and plantar hyperkeratosis 01/25/2021   Hyperglycemia 02/01/2020   Crohn's disease of large intestine with abscess (HCC) 01/31/2020   Acute diverticulitis 01/09/2020   Diverticulitis of large intestine with abscess without bleeding 06/07/2017   Abscess of sigmoid colon due to diverticulitis 05/24/2017   Tobacco dependence 05/11/2017   Essential hypertension 05/11/2017   Reflux esophagitis 05/11/2017   Tinea pedis of left foot 04/11/2015  C. difficile colitis 08/26/2011   Enteritis 08/25/2011    PCP: Delbert Clam, MD   REFERRING PROVIDER: Shirly Carlin CROME, PA-C   REFERRING DIAG: [phone] (ICD-10-CM) - S/P right rotator cuff repair   THERAPY DIAG:  Acute pain of right shoulder  Muscle weakness (generalized)  Stiffness of right shoulder, not elsewhere classified  Rationale for Evaluation and Treatment: Rehabilitation  ONSET DATE:  03/19/24  SUBJECTIVE:                                                                                                                                                                                      SUBJECTIVE STATEMENT: Shoulder is about the same. I still cannot reach up. 1/10 pain most of the time. Small scar is still sensitive.   EVAL: Pt reports her R shoulder is sore, but is improving since surgery. Pt notes the sling has been Dced, but to can wear in public as needed. Initially injured fer R shoulder during a fall backwards.  Hand dominance: Right  PERTINENT HISTORY: See above, COPD, DM2  PAIN:  Are you having pain? Yes: NPRS scale: 0/10 c pain medication Pain location: anterior R shoulder Pain description: throb, ache, sharp Aggravating factors: When pain meds wear off Relieving factors: Rest  PRECAUTIONS: Shoulder no lifting  05/11/24: Per visit with Dr. Addie- OK for strengthening and ROM 3x/wk for 6wks  RED FLAGS: None   WEIGHT BEARING RESTRICTIONS: Yes no pulling or pushing  FALLS:  Has patient fallen in last 6 months? 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  PATIENT SURVEYS:  Quick Dash: 84%  Minimally Clinically Important Difference (MCID): 15-20 points  COGNITION: Overall cognitive status: Within functional limits for tasks assessed     SENSATION: WFL  POSTURE: Forward head and rounded shoulders  UPPER EXTREMITY ROM:   Passive ROM Right eval Left eval Rt 04/25/24 Rt 05/11/24 Rt 05/17/24 Rt 05/25/24 Rt 05/29/24 Rt 06/08/24 Rt 06/12/24  Shoulder flexion 90  110d 120 120 p 120AA 125AA 95A 125AA  Shoulder extension            Shoulder abduction 75   80 pain       Shoulder adduction           Shoulder internal rotation           Shoulder external rotation 40  45d 45 45  p 45AA   45AA  Elbow flexion           Elbow extension           Wrist flexion           Wrist extension           Wrist ulnar deviation           Wrist radial deviation           Wrist pronation           Wrist supination           (Blank rows = not tested)  UPPER EXTREMITY MMT:  NT MMT Right eval Left eval  Shoulder flexion    Shoulder extension    Shoulder abduction    Shoulder adduction    Shoulder internal rotation    Shoulder external rotation    Middle trapezius    Lower trapezius    Elbow flexion    Elbow extension    Wrist flexion    Wrist extension    Wrist ulnar deviation    Wrist radial deviation    Wrist pronation    Wrist supination    Grip strength (lbs)    (Blank rows = not tested)  SHOULDER SPECIAL TESTS: NT  JOINT MOBILITY TESTING:  WNLs  PALPATION:  TTP to the L peri-GH area                                                                                                                       TREATMENT DATE:  [facility] Adult PT Treatment:     13 weeks                                 DATE: 06/22/24 Therapeutic Exercise: UBE Level 2, 3 min each way  Supine 2# shoulder flexion, long lever 10 x 2  Inclined shoulder flexion 2 x 10 AROM, 1 x 10 1#  Inclined GTB horiz abdct 10 x 2 Side lying abdct 1# with manual scap guidance.  SL Shoulder depression SL ER 2# 10 x2  Standing ROW GTB 10 x 2  Wall slide flexion Doorway ER Stretch  Sleeper stretch 10 sec x 3  Updated HEP     [facility] Adult PT Treatment:          12 weeks                                      DATE: 06/12/24 Therapeutic Exercise: UBE L1 2 mins each way Shoulder ladder for flexion ROM x3 20 Wall slide for shoulder flexion x10 Door ER stretch x3 20 S/L sleeper IR stretch x20 IR RTB 3x10 Standing bilat ER c YTB  3x10 Shoulder row 3x10  RTB Shoulder ext 3x10 RTB Manual Therapy: R GH grade 3 mobs: distraction, inf glide, PA,  AP  [facility] Adult PT Treatment:                                                DATE: 06/08/24 Therapeutic Exercise: Therapeutic Exercise: Seated pulley for flexion and scaption Wall slides x10 Ball on wall circles CW/CCW Standing bilat ER c YTB  3x10 Supine shoulder press-plus serratus 3x10 3# S/L shoulder ER 3x10 1# S/L shoulder adb 3x10 1#  [facility] Adult PT Treatment:             11 weeks s/p sx                                   DATE: 06/05/24 Therapeutic Exercise: Therapeutic Exercise: Seated pulley for flexion and scaption L cervical SB 3x10 UE Ranger 2x10 for both flexion and scaption  Shoulder IR 3x10 RTB Shoulder ER 3x10 RTB Manual Therapy: STM to the R upper shoulder c TPR Modalities: Cold pack x 10 mins to the R shoulder  PATIENT EDUCATION: Education details: Eval findings, POC, HEP, self care  Person educated: Patient Education method: Explanation, Demonstration, Tactile cues, Verbal cues, and Handouts Education comprehension: verbalized understanding, returned demonstration, verbal cues required, and tactile cues required  HOME EXERCISE PROGRAM: Access Code: [email or web address] URL: [email or web address] Date: 05/15/2024 Prepared by: Dasie Daft  Exercises - Seated Scapular Retraction  - 2 x daily - 7 x weekly - 1 sets - 10 reps - 5 hold - Seated Shoulder Flexion Towel Slide at Table Top  - 2 x daily - 7 x weekly - 1 sets - 10 reps - 5 hold - Seated Shoulder Scaption Slide at Table Top with Forearm in Neutral (Mirrored)  - 2 x daily - 7 x weekly - 1 sets - 10 reps - 5 hold - Seated Shoulder External Rotation PROM on Table (Mirrored)  - 2 x daily - 7 x weekly - 1 sets - 10 reps - 5 hold - Supine Shoulder Flexion AAROM with Hands Clasped  - 2 x daily - 7 x weekly - 1 sets - 10 reps - 5 hold - Supine Shoulder External Rotation in 45 Degrees Abduction AAROM with Dowel  - 2 x  daily - 7 x weekly - 1 sets - 10 reps - 5 hold - Isometric Shoulder Flexion at Wall  - 2 x daily - 7 x weekly - 1 sets - 5 reps - 5 hold - Isometric Shoulder Extension at Wall  - 1 x daily - 7 x weekly - 1 sets - 5 reps - 5 hold - Isometric Shoulder Abduction at Wall  - 1 x daily - 7 x weekly - 1 sets - 10 reps - 10 hold - Standing Isometric Shoulder Internal Rotation at Doorway (Mirrored)  - 1 x daily - 7 x weekly - 1 sets - 10 reps - 10 hold - Standing Isometric Shoulder External Rotation with Doorway (Mirrored)  - 1 x daily - 7 x weekly - 1 sets - 10 reps - 10 hold - Standing Shoulder Row with Anchored Resistance  - 1 x daily - 7 x weekly - 2 sets - 10 reps - 10 hold - Shoulder Flexion Wall Slide with Towel  - 1 x daily - 7 x weekly - 2-3 sets - 10 reps - [employer]  -  1 x daily - 7 x weekly - 1 sets - 3-5 reps - 20-30 hold - Standing Shoulder External Rotation Stretch in Doorway  - 1 x daily - 7 x weekly - 3 sets - 3-5 reps - 20-30 hold   ASSESSMENT:  CLINICAL IMPRESSION: PT today focused on R shoulder ROM and for RC and periscapular strengthening. Added  wall slides, S/L sleeper stretch and door ER stretch to HEP, per PT recommendation. R shoulder girdle shrugging is present c active shoulder flexion. Used manual scapular guidance in sidelying however patient continues to shrug. Will consider sidelying and possible prone posterior shoulder strengthening.  OBJECTIVE IMPAIRMENTS: decreased activity tolerance, decreased ROM, decreased strength, impaired UE functional use, and pain.   ACTIVITY LIMITATIONS: carrying, lifting, bathing, toileting, dressing, reach over head, and caring for others  PARTICIPATION LIMITATIONS: meal prep, cleaning, laundry, driving, shopping, community activity, and occupation  PERSONAL FACTORS: Past/current experiences, Time since onset of injury/illness/exacerbation, and 1 comorbidity: DM2 are also affecting patient's functional outcome.   REHAB POTENTIAL:  Good  CLINICAL DECISION MAKING: Evolving/moderate complexity  EVALUATION COMPLEXITY: Moderate   GOALS:  SHORT TERM GOALS: Target date: 05/13/24  Pt will be Ind in an initial HEP  Baseline: started Goal status: MET  2.  Increase R shoulder PROM for flexion to 120d and ER to 65d for appropriate progress with her R shoulder RC rehab  Baseline: 90 and 40 respectively 05/11/24: flexion 120, ER 45  Goal status: Partially met  LONG TERM GOALS: Target date: 08/03/24  Pt will be Ind in a final HEP to maintain achieved LOF Baseline:  Goal status: ONGOING  2.  Pt will demonstrate AROM for the R shoulder to flex=130, abd=120, ER to T1 and IR to L4 for needed functional ROM for daily activities Baseline:  06/12/24: See flow sheets Goal status: IMPROVED  3.  Pt will report 50% or greater improvement in her r shoulder pain for improved function and QOL Baseline: 8/10 06/12/24: 85% improvement in R shoulder pain Goal status: MET  5.  Pt's Quick Dash score will improve to 69% or better as indication of improved function  Baseline: 84% Goal status: ONGOING  6.  PT will developed advanced R shoulder LTGs as appropriate for her recovery Baseline:  Goal status: MET  7. Pt will be able to reach overhead c a 2# weight for improved funcitonal use of there R arm with daily activities Baseline: Unable Goal status: INITIAL as of 06/12/24  PLAN:  PT FREQUENCY: 2x/week  PT DURATION: 6 weeks  PLANNED INTERVENTIONS: 97164- PT Re-evaluation, 97110-Therapeutic exercises, 97530- Therapeutic activity, 97112- Neuromuscular re-education, 97535- Self Care, 02859- Manual therapy, [id number]- Electrical stimulation (unattended), 97016- Vasopneumatic device, 20560 (1-2 muscles), 20561 (3+ muscles)- Dry Needling, Patient/Family education, Taping, Joint mobilization, Cryotherapy, and Moist heat  PLAN FOR NEXT SESSION: Assess response to HEP; progress therex as indicated; use of modalities, manual therapy; and  TPDN as indicated.  Harlene Persons, PTA 06/22/24 12:29 PM Phone: [phone] Fax: [phone]

## 2024-06-25 ENCOUNTER — Ambulatory Visit: Admitting: Physical Therapy

## 2024-06-25 ENCOUNTER — Encounter: Payer: Self-pay | Admitting: Radiology

## 2024-06-27 ENCOUNTER — Ambulatory Visit: Attending: Family Medicine | Admitting: Physical Therapy

## 2024-06-27 ENCOUNTER — Encounter: Payer: Self-pay | Admitting: Physical Therapy

## 2024-06-27 DIAGNOSIS — M6281 Muscle weakness (generalized): Secondary | ICD-10-CM | POA: Diagnosis present

## 2024-06-27 DIAGNOSIS — M25511 Pain in right shoulder: Secondary | ICD-10-CM | POA: Insufficient documentation

## 2024-06-27 DIAGNOSIS — M25611 Stiffness of right shoulder, not elsewhere classified: Secondary | ICD-10-CM | POA: Insufficient documentation

## 2024-06-27 NOTE — Therapy (Signed)
 OUTPATIENT PHYSICAL THERAPY SHOULDER TREATMENT   Patient Name: Amanda Garner MRN: [id number] DOB:27-Aug-1970, 53 y.o., female Today's Date: 06/27/2024  END OF SESSION:  PT End of Session - 06/27/24 0808     Visit Number 16    Number of Visits 17    Date for Recertification  08/03/24    Authorization Type [employer] OTHER    Authorization - Visit Number 15    Authorization - Number of Visits 60    PT Start Time 0806    PT Stop Time 0845    PT Time Calculation (min) 39 min                       Past Medical History:  Diagnosis Date   Acid reflux    Blood transfusion    Blood transfusion without reported diagnosis    COPD (chronic obstructive pulmonary disease) (HCC)    Diabetes mellitus without complication (HCC)    Diverticulitis    Hypertension    Sickle cell trait    Past Surgical History:  Procedure Laterality Date   ABDOMINAL HYSTERECTOMY  2001   partial hysterectomy - emergency during last C-section   BICEPT TENODESIS Right 03/19/2024   Procedure: TENODESIS, BICEPS OPEN;  Surgeon: Addie Cordella Hamilton, MD;  Location: [facility];  Service: Orthopedics;  Laterality: Right;   CESAREAN SECTION  1991; 1997; 2001   CESAREAN SECTION     COLON SURGERY  11/04/2021   FLEXIBLE SIGMOIDOSCOPY N/A 11/04/2021   Procedure: FLEXIBLE SIGMOIDOSCOPY;  Surgeon: Teresa Lonni HERO, MD;  Location: [facility];  Service: General;  Laterality: N/A;   POSTERIOR LUMBAR FUSION 2 WITH HARDWARE REMOVAL Right 03/19/2024   Procedure: ARTHROSCOPY, SHOULDER WITH DEBRIDEMENT;  Surgeon: Addie Cordella Hamilton, MD;  Location: [facility];  Service: Orthopedics;  Laterality: Right;  right shoulder arthroscopy, debridement, mini open biceps tenodesis and rotator cuff tear repair, mua   SHOULDER OPEN ROTATOR CUFF REPAIR Right 03/19/2024   Procedure: REPAIR, ROTATOR CUFF, OPEN;  Surgeon: Addie Cordella Hamilton, MD;  Location: [facility];  Service:  Orthopedics;  Laterality: Right;   SMALL INTESTINE SURGERY  11/04/2021   XI ROBOTIC ASSISTED LOWER ANTERIOR RESECTION N/A 11/04/2021   Procedure: XI ROBOTIC ASSISTED LOWER ANTERIOR RESECTION, RESECTION OF COLOVAGINAL FISTULA, BILATERAL TAP BLOCK, INJECTION OF FIREFLY;  Surgeon: Teresa Lonni HERO, MD;  Location: [facility];  Service: General;  Laterality: N/A;   Patient Active Problem List   Diagnosis Date Noted   Complete tear of right rotator cuff 03/24/2024   Biceps tendonitis on right 03/24/2024   Degenerative superior labral anterior-to-posterior (SLAP) tear of right shoulder 03/24/2024   Synovitis of right shoulder 03/24/2024   COPD exacerbation (HCC) 11/21/2023   Prolonged QT interval 11/21/2023   Lactic acidosis 11/21/2023   Thyroid  nodule 11/21/2023   Type 2 diabetes mellitus (HCC) 11/05/2021   GERD (gastroesophageal reflux disease) 11/05/2021   S/P laparoscopic-assisted sigmoidectomy 11/04/2021   Abnormal CT scan 08/05/2021   Hx of diverticulitis of colon 08/05/2021   Colovaginal fistula 07/03/2021   Acquired palmar and plantar hyperkeratosis 01/25/2021   Hyperglycemia 02/01/2020   Crohn's disease of large intestine with abscess (HCC) 01/31/2020   Acute diverticulitis 01/09/2020   Diverticulitis of large intestine with abscess without bleeding 06/07/2017   Abscess of sigmoid colon due to diverticulitis 05/24/2017   Tobacco dependence 05/11/2017   Essential hypertension 05/11/2017   Reflux esophagitis 05/11/2017   Tinea pedis of left foot 04/11/2015  C. difficile colitis 08/26/2011   Enteritis 08/25/2011    PCP: Delbert Clam, MD   REFERRING PROVIDER: Shirly Carlin CROME, PA-C   REFERRING DIAG: [phone] (ICD-10-CM) - S/P right rotator cuff repair   THERAPY DIAG:  Acute pain of right shoulder  Muscle weakness (generalized)  Rationale for Evaluation and Treatment: Rehabilitation  ONSET DATE: 03/19/24  SUBJECTIVE:                                                                                                                                                                                       SUBJECTIVE STATEMENT: Shoulder is about the same. I still cannot reach up.   EVAL: Pt reports her R shoulder is sore, but is improving since surgery. Pt notes the sling has been Dced, but to can wear in public as needed. Initially injured fer R shoulder during a fall backwards.  Hand dominance: Right  PERTINENT HISTORY: See above, COPD, DM2  PAIN:  Are you having pain? Yes: NPRS scale: 0/10  Pain location: anterior R shoulder Pain description: throb, ache, sharp Aggravating factors: When pain meds wear off Relieving factors: Rest  PRECAUTIONS: Shoulder no lifting  05/11/24: Per visit with Dr. Addie- OK for strengthening and ROM 3x/wk for 6wks  RED FLAGS: None   WEIGHT BEARING RESTRICTIONS: Yes no pulling or pushing  FALLS:  Has patient fallen in last 6 months? 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  PATIENT SURVEYS:  Quick Dash: 84%  Minimally Clinically Important Difference (MCID): 15-20 points  COGNITION: Overall cognitive status: Within functional limits for tasks assessed     SENSATION: WFL  POSTURE: Forward head and rounded shoulders  UPPER EXTREMITY ROM:   Passive ROM Right eval Left eval Rt 04/25/24 Rt 05/11/24 Rt 05/17/24 Rt 05/25/24 Rt 05/29/24 Rt 06/08/24 Rt 06/12/24 RT 06/27/24  Shoulder flexion 90  110d 120 120 p 120AA 125AA 95A 125AA 130 PROM  Shoulder extension            Shoulder abduction 75   80 pain        Shoulder adduction            Shoulder internal rotation            Shoulder external  rotation 40  45d 45 45 p 45AA   45AA 45 PROM  Elbow flexion  Elbow extension            Wrist flexion            Wrist extension            Wrist ulnar deviation            Wrist radial deviation            Wrist pronation            Wrist supination            (Blank rows = not tested)  UPPER EXTREMITY MMT:  NT MMT Right eval Left eval  Shoulder flexion    Shoulder extension    Shoulder abduction    Shoulder adduction    Shoulder internal rotation    Shoulder external rotation    Middle trapezius    Lower trapezius    Elbow flexion    Elbow extension    Wrist flexion    Wrist extension    Wrist ulnar deviation    Wrist radial deviation    Wrist pronation    Wrist supination    Grip strength (lbs)    (Blank rows = not tested)  SHOULDER SPECIAL TESTS: NT  JOINT MOBILITY TESTING:  WNLs  PALPATION:  TTP to the L peri-GH area                                                                                                                       TREATMENT DATE:  [facility] Adult PT Treatment:                                                DATE: 06/27/24 Therapeutic Exercise: Supine GTB horiz abdct 10 x 2  Supine right PNF/diagonal red band x 10 Shoulder GTB ROWs Standing attempts at W backs and wall angels Manual Therapy: A/p and inferior glides to right shoulder  PROM right shoulder Scapular mobs in sidelying Manual scap assistance with SL shoulder movements STW/TPR along medial border of right scapula     [facility] Adult PT Treatment:     13 weeks                                 DATE: 06/22/24 Therapeutic Exercise: UBE Level 2, 3 min each way  Supine 2# shoulder flexion, long lever 10 x 2  Inclined shoulder flexion 2 x 10 AROM, 1 x 10 1#  Inclined GTB horiz abdct 10 x 2 Side lying abdct 1# with manual scap guidance.  SL Shoulder depression SL ER 2# 10 x2  Standing ROW GTB 10 x 2  Wall slide flexion Doorway ER Stretch  Sleeper stretch 10 sec x 3   Updated HEP     [facility] Adult PT Treatment:  12 weeks                                      DATE: 06/12/24 Therapeutic Exercise: UBE L1 2 mins each way Shoulder ladder for flexion ROM x3 20 Wall slide for shoulder flexion x10 Door ER stretch x3 20 S/L sleeper IR stretch x20 IR RTB 3x10 Standing bilat ER c YTB  3x10 Shoulder row 3x10 RTB Shoulder ext 3x10 RTB Manual Therapy: R GH grade 3 mobs: distraction, inf glide, PA, AP  [facility] Adult PT Treatment:                                                DATE: 06/08/24 Therapeutic Exercise: Therapeutic Exercise: Seated pulley for flexion and scaption Wall slides x10 Ball on wall circles CW/CCW Standing bilat ER c YTB  3x10 Supine shoulder press-plus serratus 3x10 3# S/L shoulder ER 3x10 1# S/L shoulder adb 3x10 1#  [facility] Adult PT Treatment:             11 weeks s/p sx                                   DATE: 06/05/24 Therapeutic Exercise: Therapeutic Exercise: Seated pulley for flexion and scaption L cervical SB 3x10 UE Ranger 2x10 for both flexion and scaption  Shoulder IR 3x10 RTB Shoulder ER 3x10 RTB Manual Therapy: STM to the R upper shoulder c TPR Modalities: Cold pack x 10 mins to the R shoulder  PATIENT EDUCATION: Education details: Eval findings, POC, HEP, self care  Person educated: Patient Education method: Explanation, Demonstration, Tactile cues, Verbal cues, and Handouts Education comprehension: verbalized understanding, returned demonstration, verbal cues required, and tactile cues required  HOME EXERCISE PROGRAM: Access Code: [email or web address] URL: [email or web address] Date: 05/15/2024 Prepared by: Dasie Daft  Exercises - Seated Scapular Retraction  - 2 x daily - 7 x weekly - 1 sets - 10 reps - 5 hold - Seated Shoulder Flexion Towel Slide at Table Top  - 2 x daily - 7 x weekly - 1 sets - 10 reps - 5 hold - Seated Shoulder Scaption Slide at Table Top with Forearm in Neutral (Mirrored)  - 2  x daily - 7 x weekly - 1 sets - 10 reps - 5 hold - Seated Shoulder External Rotation PROM on Table (Mirrored)  - 2 x daily - 7 x weekly - 1 sets - 10 reps - 5 hold - Supine Shoulder Flexion AAROM with Hands Clasped  - 2 x daily - 7 x weekly - 1 sets - 10 reps - 5 hold - Supine Shoulder External Rotation in 45 Degrees Abduction AAROM with Dowel  - 2 x daily - 7 x weekly - 1 sets - 10 reps - 5 hold - Isometric Shoulder Flexion at Wall  - 2 x daily - 7 x weekly - 1 sets - 5 reps - 5 hold - Isometric Shoulder Extension at Wall  - 1 x daily - 7 x weekly - 1 sets - 5 reps - 5 hold - Isometric Shoulder Abduction at Wall  - 1 x daily - 7 x weekly - 1 sets - 10 reps - 10 hold - Standing Isometric Shoulder  Internal Rotation at Doorway (Mirrored)  - 1 x daily - 7 x weekly - 1 sets - 10 reps - 10 hold - Standing Isometric Shoulder External Rotation with Doorway (Mirrored)  - 1 x daily - 7 x weekly - 1 sets - 10 reps - 10 hold - Standing Shoulder Row with Anchored Resistance  - 1 x daily - 7 x weekly - 2 sets - 10 reps - 10 hold Added 06/22/24 - Shoulder Flexion Wall Slide with Towel  - 1 x daily - 7 x weekly - 2-3 sets - 10 reps - Sleeper Stretch  - 1 x daily - 7 x weekly - 1 sets - 3-5 reps - 20-30 hold - Standing Shoulder External Rotation Stretch in Doorway  - 1 x daily - 7 x weekly - 3 sets - 3-5 reps - 20-30 hold Added 06/27/24 - Supine PNF D2 Flexion with Resistance  - 1 x daily - 7 x weekly - 2 sets - 10 reps   ASSESSMENT:  CLINICAL IMPRESSION: PT today focused on R shoulder ROM and for RC and periscapular strengthening. R shoulder girdle shrugging is present c active shoulder flexion and abduction. Used manual scapular guidance in sidelying however patient continues to shrug. Will consider additional  sidelying and possible prone posterior shoulder strengthening. Continue manual and mobs, PROM.    OBJECTIVE IMPAIRMENTS: decreased activity tolerance, decreased ROM, decreased strength, impaired UE  functional use, and pain.   ACTIVITY LIMITATIONS: carrying, lifting, bathing, toileting, dressing, reach over head, and caring for others  PARTICIPATION LIMITATIONS: meal prep, cleaning, laundry, driving, shopping, community activity, and occupation  PERSONAL FACTORS: Past/current experiences, Time since onset of injury/illness/exacerbation, and 1 comorbidity: DM2 are also affecting patient's functional outcome.   REHAB POTENTIAL: Good  CLINICAL DECISION MAKING: Evolving/moderate complexity  EVALUATION COMPLEXITY: Moderate   GOALS:  SHORT TERM GOALS: Target date: 05/13/24  Pt will be Ind in an initial HEP  Baseline: started Goal status: MET  2.  Increase R shoulder PROM for flexion to 120d and ER to 65d for appropriate progress with her R shoulder RC rehab  Baseline: 90 and 40 respectively 05/11/24: flexion 120, ER 45  06/27/24 Goal status: Partially met  LONG TERM GOALS: Target date: 08/03/24  Pt will be Ind in a final HEP to maintain achieved LOF Baseline:  Goal status: ONGOING  2.  Pt will demonstrate AROM for the R shoulder to flex=130, abd=120, ER to T1 and IR to L4 for needed functional ROM for daily activities Baseline:  06/12/24: See flow sheets Goal status: IMPROVED  3.  Pt will report 50% or greater improvement in her r shoulder pain for improved function and QOL Baseline: 8/10 06/12/24: 85% improvement in R shoulder pain Goal status: MET  5.  Pt's Quick Dash score will improve to 69% or better as indication of improved function  Baseline: 84% Goal status: ONGOING  6.  PT will developed advanced R shoulder LTGs as appropriate for her recovery Baseline:  Goal status: MET  7. Pt will be able to reach overhead c a 2# weight for improved funcitonal use of there R arm with daily activities Baseline: Unable Goal status: INITIAL as of 06/12/24  PLAN:  PT FREQUENCY: 2x/week  PT DURATION: 6 weeks  PLANNED INTERVENTIONS: 97164- PT Re-evaluation,  97110-Therapeutic exercises, 97530- Therapeutic activity, 97112- Neuromuscular re-education, 97535- Self Care, 02859- Manual therapy, [id number]- Electrical stimulation (unattended), 97016- Vasopneumatic device, 79439 (1-2 muscles), 20561 (3+ muscles)- Dry Needling, Patient/Family education, Taping, Joint mobilization, Cryotherapy,  and Moist heat  PLAN FOR NEXT SESSION: Assess response to HEP; progress therex as indicated; use of modalities, manual therapy; and TPDN as indicated.  Harlene Persons, PTA 06/27/24 10:43 AM Phone: [phone] Fax: [phone]

## 2024-06-29 ENCOUNTER — Ambulatory Visit: Admitting: Physical Therapy

## 2024-07-02 ENCOUNTER — Ambulatory Visit: Admitting: Physical Therapy

## 2024-07-04 ENCOUNTER — Encounter: Payer: Self-pay | Admitting: Physical Therapy

## 2024-07-04 ENCOUNTER — Ambulatory Visit: Admitting: Physical Therapy

## 2024-07-04 DIAGNOSIS — M25511 Pain in right shoulder: Secondary | ICD-10-CM | POA: Diagnosis not present

## 2024-07-04 DIAGNOSIS — M25611 Stiffness of right shoulder, not elsewhere classified: Secondary | ICD-10-CM

## 2024-07-04 DIAGNOSIS — M6281 Muscle weakness (generalized): Secondary | ICD-10-CM

## 2024-07-04 NOTE — Therapy (Signed)
 OUTPATIENT PHYSICAL THERAPY SHOULDER TREATMENT   Patient Name: Amanda Garner MRN: [id number] DOB:22-Mar-1971, 53 y.o., female Today's Date: 07/04/2024  END OF SESSION:  PT End of Session - 07/04/24 0805     Visit Number 17    Number of Visits 17    Date for Recertification  08/03/24    Authorization Type [employer] OTHER    Authorization - Visit Number 16    PT Start Time 0805    PT Stop Time 0845    PT Time Calculation (min) 40 min                       Past Medical History:  Diagnosis Date   Acid reflux    Blood transfusion    Blood transfusion without reported diagnosis    COPD (chronic obstructive pulmonary disease) (HCC)    Diabetes mellitus without complication (HCC)    Diverticulitis    Hypertension    Sickle cell trait    Past Surgical History:  Procedure Laterality Date   ABDOMINAL HYSTERECTOMY  2001   partial hysterectomy - emergency during last C-section   BICEPT TENODESIS Right 03/19/2024   Procedure: TENODESIS, BICEPS OPEN;  Surgeon: Addie Cordella Hamilton, MD;  Location: [facility];  Service: Orthopedics;  Laterality: Right;   CESAREAN SECTION  1991; 1997; 2001   CESAREAN SECTION     COLON SURGERY  11/04/2021   FLEXIBLE SIGMOIDOSCOPY N/A 11/04/2021   Procedure: FLEXIBLE SIGMOIDOSCOPY;  Surgeon: Teresa Lonni HERO, MD;  Location: [facility];  Service: General;  Laterality: N/A;   POSTERIOR LUMBAR FUSION 2 WITH HARDWARE REMOVAL Right 03/19/2024   Procedure: ARTHROSCOPY, SHOULDER WITH DEBRIDEMENT;  Surgeon: Addie Cordella Hamilton, MD;  Location: [facility];  Service: Orthopedics;  Laterality: Right;  right shoulder arthroscopy, debridement, mini open biceps tenodesis and rotator cuff tear repair, mua   SHOULDER OPEN ROTATOR CUFF REPAIR Right 03/19/2024   Procedure: REPAIR, ROTATOR CUFF, OPEN;  Surgeon: Addie Cordella Hamilton, MD;  Location: [facility];  Service: Orthopedics;  Laterality: Right;   SMALL  INTESTINE SURGERY  11/04/2021   XI ROBOTIC ASSISTED LOWER ANTERIOR RESECTION N/A 11/04/2021   Procedure: XI ROBOTIC ASSISTED LOWER ANTERIOR RESECTION, RESECTION OF COLOVAGINAL FISTULA, BILATERAL TAP BLOCK, INJECTION OF FIREFLY;  Surgeon: Teresa Lonni HERO, MD;  Location: [facility];  Service: General;  Laterality: N/A;   Patient Active Problem List   Diagnosis Date Noted   Complete tear of right rotator cuff 03/24/2024   Biceps tendonitis on right 03/24/2024   Degenerative superior labral anterior-to-posterior (SLAP) tear of right shoulder 03/24/2024   Synovitis of right shoulder 03/24/2024   COPD exacerbation (HCC) 11/21/2023   Prolonged QT interval 11/21/2023   Lactic acidosis 11/21/2023   Thyroid  nodule 11/21/2023   Type 2 diabetes mellitus (HCC) 11/05/2021   GERD (gastroesophageal reflux disease) 11/05/2021   S/P laparoscopic-assisted sigmoidectomy 11/04/2021   Abnormal CT scan 08/05/2021   Hx of diverticulitis of colon 08/05/2021   Colovaginal fistula 07/03/2021   Acquired palmar and plantar hyperkeratosis 01/25/2021   Hyperglycemia 02/01/2020   Crohn's disease of large intestine with abscess (HCC) 01/31/2020   Acute diverticulitis 01/09/2020   Diverticulitis of large intestine with abscess without bleeding 06/07/2017   Abscess of sigmoid colon due to diverticulitis 05/24/2017   Tobacco dependence 05/11/2017   Essential hypertension 05/11/2017   Reflux esophagitis 05/11/2017   Tinea pedis of left foot 04/11/2015   C. difficile colitis 08/26/2011   Enteritis 08/25/2011  PCP: Delbert Clam, MD   REFERRING PROVIDER: Shirly Carlin CROME, PA-C   REFERRING DIAG: [phone] (ICD-10-CM) - S/P right rotator cuff repair   THERAPY DIAG:  Acute pain of right shoulder  Muscle weakness (generalized)  Stiffness of right shoulder, not elsewhere classified  Rationale for Evaluation and Treatment: Rehabilitation  ONSET DATE: 03/19/24  SUBJECTIVE:                                                                                                                                                                                       SUBJECTIVE STATEMENT: Shoulder is reaching up better and I think the shoulder hike is better.    EVAL: Pt reports her R shoulder is sore, but is improving since surgery. Pt notes the sling has been Dced, but to can wear in public as needed. Initially injured fer R shoulder during a fall backwards.  Hand dominance: Right  PERTINENT HISTORY: See above, COPD, DM2  PAIN:  Are you having pain? Yes: NPRS scale: 0/10  Pain location: anterior R shoulder Pain description: throb, ache, sharp Aggravating factors: When pain meds wear off Relieving factors: Rest  PRECAUTIONS: Shoulder no lifting  05/11/24: Per visit with Dr. Addie- OK for strengthening and ROM 3x/wk for 6wks  RED FLAGS: None   WEIGHT BEARING RESTRICTIONS: Yes no pulling or pushing  FALLS:  Has patient fallen in last 6 months? 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  PATIENT SURVEYS:  Quick Dash: 84%  Minimally Clinically Important Difference (MCID): 15-20 points  COGNITION: Overall cognitive status: Within functional limits for tasks assessed     SENSATION: WFL  POSTURE: Forward head and rounded shoulders  UPPER EXTREMITY ROM:   Passive ROM Right eval Left eval Rt 04/25/24 Rt 05/11/24 Rt 05/17/24 Rt 05/25/24 Rt 05/29/24 Rt 06/08/24 Rt 06/12/24 RT 06/27/24  Shoulder flexion 90  110d 120 120 p 120AA 125AA 95A 125AA 130 PROM  Shoulder extension            Shoulder abduction 75   80 pain        Shoulder adduction            Shoulder internal  rotation            Shoulder external rotation 40  45d 45 45 p 45AA   45AA 45 PROM  Elbow flexion  Elbow extension            Wrist flexion            Wrist extension            Wrist ulnar deviation            Wrist radial deviation            Wrist pronation            Wrist supination            (Blank rows = not tested)  UPPER EXTREMITY MMT:  NT MMT Right eval Left eval  Shoulder flexion    Shoulder extension    Shoulder abduction    Shoulder adduction    Shoulder internal rotation    Shoulder external rotation    Middle trapezius    Lower trapezius    Elbow flexion    Elbow extension    Wrist flexion    Wrist extension    Wrist ulnar deviation    Wrist radial deviation    Wrist pronation    Wrist supination    Grip strength (lbs)    (Blank rows = not tested)  SHOULDER SPECIAL TESTS: NT  JOINT MOBILITY TESTING:  WNLs  PALPATION:  TTP to the L peri-GH area                                                                                                                       TREATMENT DATE:  [facility] Adult PT Treatment:                                                DATE: 07/04/24 Therapeutic Exercise: Supine GTB horiz abdct 15 x 2  Supine right PNF/ diagoanl red red band  Side lying Right shoulder abduction  ROW blue band  Standing horiz abdct YTB attached  12 x 2  Standng at wall alternating diagonal AROM 10 x 2  Wall W backs 10 x 2 AROM  Manual Therapy: A/p and inferior glides to right shoulder  PROM right shoulder Manual scap assistance with SL shoulder movements STW/TPR along medial border of right scapula    [facility] Adult PT Treatment:                                                DATE: 06/27/24 Therapeutic Exercise: Supine GTB horiz abdct 10 x 2  Supine right PNF/diagonal red band x 10 Shoulder GTB ROWs Standing attempts at W backs and wall angels Manual Therapy: A/p and inferior glides to right shoulder  PROM right shoulder Scapular  mobs in sidelying Manual scap assistance with SL shoulder movements STW/TPR along medial border of right scapula  [facility] Adult PT Treatment:     13 weeks                                 DATE: 06/22/24 Therapeutic Exercise: UBE Level 2, 3 min each way  Supine 2# shoulder flexion, long lever 10 x 2  Inclined shoulder flexion 2 x 10 AROM, 1 x 10 1#  Inclined GTB horiz abdct 10 x 2 Side lying abdct 1# with manual scap guidance.  SL Shoulder depression SL ER 2# 10 x2  Standing ROW GTB 10 x 2  Wall slide flexion Doorway ER Stretch  Sleeper stretch 10 sec x 3  Updated HEP     [facility] Adult PT Treatment:          12 weeks                                      DATE: 06/12/24 Therapeutic Exercise: UBE L1 2 mins each way Shoulder ladder for flexion ROM x3 20 Wall slide for shoulder flexion x10 Door ER stretch x3 20 S/L sleeper IR stretch x20 IR RTB 3x10 Standing bilat ER c YTB  3x10 Shoulder row 3x10 RTB Shoulder ext 3x10 RTB Manual Therapy: R GH grade 3 mobs: distraction, inf glide, PA, AP  [facility] Adult PT Treatment:                                                DATE: 06/08/24 Therapeutic Exercise: Therapeutic Exercise: Seated pulley for flexion and scaption Wall slides x10 Ball on wall circles CW/CCW Standing bilat ER c YTB  3x10 Supine shoulder press-plus serratus 3x10 3# S/L shoulder ER 3x10 1# S/L shoulder adb 3x10 1#    PATIENT EDUCATION: Education details: Eval findings, POC, HEP, self care  Person educated: Patient Education method: Explanation, Demonstration, Tactile cues, Verbal cues, and Handouts Education comprehension: verbalized understanding, returned demonstration, verbal cues required, and tactile cues required  HOME EXERCISE PROGRAM: Access Code: [email or web address] URL: [email or web address] Date: 05/15/2024 Prepared by: Dasie Daft  Exercises - Seated Scapular Retraction  - 2 x daily - 7 x weekly - 1 sets - 10 reps - 5 hold - Seated  Shoulder Flexion Towel Slide at Table Top  - 2 x daily - 7 x weekly - 1 sets - 10 reps - 5 hold - Seated Shoulder Scaption Slide at Table Top with Forearm in Neutral (Mirrored)  - 2 x daily - 7 x weekly - 1 sets - 10 reps - 5 hold - Seated Shoulder External Rotation PROM on Table (Mirrored)  - 2 x daily - 7 x weekly - 1 sets - 10 reps - 5 hold - Supine Shoulder Flexion AAROM with Hands Clasped  - 2 x daily - 7 x weekly - 1 sets - 10 reps - 5 hold - Supine Shoulder External Rotation in 45 Degrees Abduction AAROM with Dowel  - 2 x daily - 7 x weekly - 1 sets - 10 reps - 5 hold - Isometric Shoulder Flexion at Wall  - 2 x daily - 7 x weekly - 1 sets - 5 reps - 5 hold - Isometric Shoulder Extension at Wall  - 1 x daily - 7 x weekly - 1  sets - 5 reps - 5 hold - Isometric Shoulder Abduction at Wall  - 1 x daily - 7 x weekly - 1 sets - 10 reps - 10 hold - Standing Isometric Shoulder Internal Rotation at Doorway (Mirrored)  - 1 x daily - 7 x weekly - 1 sets - 10 reps - 10 hold - Standing Isometric Shoulder External Rotation with Doorway (Mirrored)  - 1 x daily - 7 x weekly - 1 sets - 10 reps - 10 hold - Standing Shoulder Row with Anchored Resistance  - 1 x daily - 7 x weekly - 2 sets - 10 reps - 10 hold Added 06/22/24 - Shoulder Flexion Wall Slide with Towel  - 1 x daily - 7 x weekly - 2-3 sets - 10 reps - Sleeper Stretch  - 1 x daily - 7 x weekly - 1 sets - 3-5 reps - 20-30 hold - Standing Shoulder External Rotation Stretch in Doorway  - 1 x daily - 7 x weekly - 3 sets - 3-5 reps - 20-30 hold Added 06/27/24 - Supine PNF D2 Flexion with Resistance  - 1 x daily - 7 x weekly - 2 sets - 10 reps   ASSESSMENT:  CLINICAL IMPRESSION: PT today focused on R shoulder ROM and for RC and periscapular strengthening. R shoulder girdle shrugging is present c active shoulder flexion and abduction.  Worked on scapular mobs and scapular strengthening. Continue manual and mobs, PROM.    OBJECTIVE IMPAIRMENTS: decreased  activity tolerance, decreased ROM, decreased strength, impaired UE functional use, and pain.   ACTIVITY LIMITATIONS: carrying, lifting, bathing, toileting, dressing, reach over head, and caring for others  PARTICIPATION LIMITATIONS: meal prep, cleaning, laundry, driving, shopping, community activity, and occupation  PERSONAL FACTORS: Past/current experiences, Time since onset of injury/illness/exacerbation, and 1 comorbidity: DM2 are also affecting patient's functional outcome.   REHAB POTENTIAL: Good  CLINICAL DECISION MAKING: Evolving/moderate complexity  EVALUATION COMPLEXITY: Moderate   GOALS:  SHORT TERM GOALS: Target date: 05/13/24  Pt will be Ind in an initial HEP  Baseline: started Goal status: MET  2.  Increase R shoulder PROM for flexion to 120d and ER to 65d for appropriate progress with her R shoulder RC rehab  Baseline: 90 and 40 respectively 05/11/24: flexion 120, ER 45  06/27/24 Goal status: Partially met  LONG TERM GOALS: Target date: 08/03/24  Pt will be Ind in a final HEP to maintain achieved LOF Baseline:  Goal status: ONGOING  2.  Pt will demonstrate AROM for the R shoulder to flex=130, abd=120, ER to T1 and IR to L4 for needed functional ROM for daily activities Baseline:  06/12/24: See flow sheets Goal status: IMPROVED  3.  Pt will report 50% or greater improvement in her r shoulder pain for improved function and QOL Baseline: 8/10 06/12/24: 85% improvement in R shoulder pain Goal status: MET  5.  Pt's Quick Dash score will improve to 69% or better as indication of improved function  Baseline: 84% Goal status: ONGOING  6.  PT will developed advanced R shoulder LTGs as appropriate for her recovery Baseline:  Goal status: MET  7. Pt will be able to reach overhead c a 2# weight for improved funcitonal use of there R arm with daily activities Baseline: Unable Goal status: INITIAL as of 06/12/24  PLAN:  PT FREQUENCY: 2x/week  PT DURATION: 6  weeks  PLANNED INTERVENTIONS: 97164- PT Re-evaluation, 97110-Therapeutic exercises, 97530- Therapeutic activity, 97112- Neuromuscular re-education, 97535- Self Care, 02859- Manual therapy,  [id number]- Electrical stimulation (unattended), 97016- Vasopneumatic device, 20560 (1-2 muscles), 20561 (3+ muscles)- Dry Needling, Patient/Family education, Taping, Joint mobilization, Cryotherapy, and Moist heat  PLAN FOR NEXT SESSION: Assess response to HEP; progress therex as indicated; use of modalities, manual therapy; and TPDN as indicated.  Harlene Persons, PTA 07/04/24 11:25 AM Phone: [phone] Fax: [phone]

## 2024-07-06 ENCOUNTER — Encounter: Payer: Self-pay | Admitting: Physical Therapy

## 2024-07-06 ENCOUNTER — Ambulatory Visit: Admitting: Physical Therapy

## 2024-07-06 DIAGNOSIS — M25511 Pain in right shoulder: Secondary | ICD-10-CM | POA: Diagnosis not present

## 2024-07-06 DIAGNOSIS — M6281 Muscle weakness (generalized): Secondary | ICD-10-CM

## 2024-07-06 NOTE — Therapy (Signed)
 OUTPATIENT PHYSICAL THERAPY SHOULDER TREATMENT   Patient Name: Amanda Garner MRN: [id number] DOB:02-10-1971, 53 y.o., female Today's Date: 07/06/2024  END OF SESSION:  PT End of Session - 07/06/24 0856     Visit Number 18    Date for Recertification  08/03/24    Authorization Type [employer] OTHER    Authorization - Visit Number 17    Authorization - Number of Visits 60    PT Start Time [phone]   pt late   PT Stop Time 0928    PT Time Calculation (min) 33 min                       Past Medical History:  Diagnosis Date   Acid reflux    Blood transfusion    Blood transfusion without reported diagnosis    COPD (chronic obstructive pulmonary disease) (HCC)    Diabetes mellitus without complication (HCC)    Diverticulitis    Hypertension    Sickle cell trait    Past Surgical History:  Procedure Laterality Date   ABDOMINAL HYSTERECTOMY  2001   partial hysterectomy - emergency during last C-section   BICEPT TENODESIS Right 03/19/2024   Procedure: TENODESIS, BICEPS OPEN;  Surgeon: Addie Cordella Hamilton, MD;  Location: [facility];  Service: Orthopedics;  Laterality: Right;   CESAREAN SECTION  1991; 1997; 2001   CESAREAN SECTION     COLON SURGERY  11/04/2021   FLEXIBLE SIGMOIDOSCOPY N/A 11/04/2021   Procedure: FLEXIBLE SIGMOIDOSCOPY;  Surgeon: Teresa Lonni HERO, MD;  Location: [facility];  Service: General;  Laterality: N/A;   POSTERIOR LUMBAR FUSION 2 WITH HARDWARE REMOVAL Right 03/19/2024   Procedure: ARTHROSCOPY, SHOULDER WITH DEBRIDEMENT;  Surgeon: Addie Cordella Hamilton, MD;  Location: [facility];  Service: Orthopedics;  Laterality: Right;  right shoulder arthroscopy, debridement, mini open biceps tenodesis and rotator cuff tear repair, mua   SHOULDER OPEN ROTATOR CUFF REPAIR Right 03/19/2024   Procedure: REPAIR, ROTATOR CUFF, OPEN;  Surgeon: Addie Cordella Hamilton, MD;  Location: [facility];  Service: Orthopedics;   Laterality: Right;   SMALL INTESTINE SURGERY  11/04/2021   XI ROBOTIC ASSISTED LOWER ANTERIOR RESECTION N/A 11/04/2021   Procedure: XI ROBOTIC ASSISTED LOWER ANTERIOR RESECTION, RESECTION OF COLOVAGINAL FISTULA, BILATERAL TAP BLOCK, INJECTION OF FIREFLY;  Surgeon: Teresa Lonni HERO, MD;  Location: [facility];  Service: General;  Laterality: N/A;   Patient Active Problem List   Diagnosis Date Noted   Complete tear of right rotator cuff 03/24/2024   Biceps tendonitis on right 03/24/2024   Degenerative superior labral anterior-to-posterior (SLAP) tear of right shoulder 03/24/2024   Synovitis of right shoulder 03/24/2024   COPD exacerbation (HCC) 11/21/2023   Prolonged QT interval 11/21/2023   Lactic acidosis 11/21/2023   Thyroid  nodule 11/21/2023   Type 2 diabetes mellitus (HCC) 11/05/2021   GERD (gastroesophageal reflux disease) 11/05/2021   S/P laparoscopic-assisted sigmoidectomy 11/04/2021   Abnormal CT scan 08/05/2021   Hx of diverticulitis of colon 08/05/2021   Colovaginal fistula 07/03/2021   Acquired palmar and plantar hyperkeratosis 01/25/2021   Hyperglycemia 02/01/2020   Crohn's disease of large intestine with abscess (HCC) 01/31/2020   Acute diverticulitis 01/09/2020   Diverticulitis of large intestine with abscess without bleeding 06/07/2017   Abscess of sigmoid colon due to diverticulitis 05/24/2017   Tobacco dependence 05/11/2017   Essential hypertension 05/11/2017   Reflux esophagitis 05/11/2017   Tinea pedis of left foot 04/11/2015   C. difficile colitis 08/26/2011  Enteritis 08/25/2011    PCP: Delbert Clam, MD   REFERRING PROVIDER: Shirly Carlin CROME, PA-C   REFERRING DIAG: [phone] (ICD-10-CM) - S/P right rotator cuff repair   THERAPY DIAG:  Acute pain of right shoulder  Muscle weakness (generalized)  Rationale for Evaluation and Treatment: Rehabilitation  ONSET DATE: 03/19/24  SUBJECTIVE:                                                                                                                                                                                       SUBJECTIVE STATEMENT: Pt reports compliance with HEP. Working on shoulder hike.   EVAL: Pt reports her R shoulder is sore, but is improving since surgery. Pt notes the sling has been Dced, but to can wear in public as needed. Initially injured fer R shoulder during a fall backwards.  Hand dominance: Right  PERTINENT HISTORY: See above, COPD, DM2  PAIN:  Are you having pain? Yes: NPRS scale: 0/10  Pain location: anterior R shoulder Pain description: throb, ache, sharp Aggravating factors: When pain meds wear off Relieving factors: Rest  PRECAUTIONS: Shoulder no lifting  05/11/24: Per visit with Dr. Addie- OK for strengthening and ROM 3x/wk for 6wks  RED FLAGS: None   WEIGHT BEARING RESTRICTIONS: Yes no pulling or pushing  FALLS:  Has patient fallen in last 6 months? 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  PATIENT SURVEYS:  Quick Dash: 84%  Minimally Clinically Important Difference (MCID): 15-20 points  COGNITION: Overall cognitive status: Within functional limits for tasks assessed     SENSATION: WFL  POSTURE: Forward head and rounded shoulders  UPPER EXTREMITY ROM:   Passive ROM Right eval Left eval Rt 04/25/24 Rt 05/11/24 Rt 05/17/24 Rt 05/25/24 Rt 05/29/24 Rt 06/08/24 Rt 06/12/24 RT 06/27/24  Shoulder flexion 90  110d 120 120 p 120AA 125AA 95A 125AA 130 PROM  Shoulder extension            Shoulder abduction 75   80 pain        Shoulder adduction            Shoulder internal rotation            Shoulder external rotation  40  45d 45 45 p 45AA   45AA 45 PROM  Elbow flexion  Elbow extension            Wrist flexion            Wrist extension            Wrist ulnar deviation            Wrist radial deviation            Wrist pronation            Wrist supination            (Blank rows = not tested)  UPPER EXTREMITY MMT:  NT MMT Right eval Left eval  Shoulder flexion    Shoulder extension    Shoulder abduction    Shoulder adduction    Shoulder internal rotation    Shoulder external rotation    Middle trapezius    Lower trapezius    Elbow flexion    Elbow extension    Wrist flexion    Wrist extension    Wrist ulnar deviation    Wrist radial deviation    Wrist pronation    Wrist supination    Grip strength (lbs)    (Blank rows = not tested)  SHOULDER SPECIAL TESTS: NT  JOINT MOBILITY TESTING:  WNLs  PALPATION:  TTP to the L peri-GH area                                                                                                                       TREATMENT DATE:  [facility] Adult PT Treatment:                                                DATE: 07/06/24 Therapeutic Exercise: Supine GTB star pattern 15 x 2  Side lying Right shoulder abduction to horiz addct to extension 2# x 10 with manual scap guidance  Prone 2# row prone 2# x 10 each  Prone ball on floor circles CW / CCW Qped rocking onto UE x 10 ROW and Pull down Blue x 15 each  Wall W backs 10 x 2 AROM  Wall angels x 5  Manual Therapy: A/p and inferior glides to right shoulder  PROM right shoulder Manual scap assistance with SL shoulder movements   [facility] Adult PT Treatment:                                                DATE: 07/04/24 Therapeutic Exercise: Supine GTB horiz abdct 15 x 2  Supine right PNF/ diagoanl red red band  Side lying Right shoulder abduction  ROW blue band  Standing horiz abdct YTB attached  12 x 2  Standng at wall alternating diagonal AROM 10 x 2  Wall W backs 10 x  2 AROM  Manual  Therapy: A/p and inferior glides to right shoulder  PROM right shoulder Manual scap assistance with SL shoulder movements STW/TPR along medial border of right scapula    [facility] Adult PT Treatment:                                                DATE: 06/27/24 Therapeutic Exercise: Supine GTB horiz abdct 10 x 2  Supine right PNF/diagonal red band x 10 Shoulder GTB ROWs Standing attempts at W backs and wall angels Manual Therapy: A/p and inferior glides to right shoulder  PROM right shoulder Scapular mobs in sidelying Manual scap assistance with SL shoulder movements STW/TPR along medial border of right scapula     [facility] Adult PT Treatment:     13 weeks                                 DATE: 06/22/24 Therapeutic Exercise: UBE Level 2, 3 min each way  Supine 2# shoulder flexion, long lever 10 x 2  Inclined shoulder flexion 2 x 10 AROM, 1 x 10 1#  Inclined GTB horiz abdct 10 x 2 Side lying abdct 1# with manual scap guidance.  SL Shoulder depression SL ER 2# 10 x2  Standing ROW GTB 10 x 2  Wall slide flexion Doorway ER Stretch  Sleeper stretch 10 sec x 3  Updated HEP     [facility] Adult PT Treatment:          12 weeks                                      DATE: 06/12/24 Therapeutic Exercise: UBE L1 2 mins each way Shoulder ladder for flexion ROM x3 20 Wall slide for shoulder flexion x10 Door ER stretch x3 20 S/L sleeper IR stretch x20 IR RTB 3x10 Standing bilat ER c YTB  3x10 Shoulder row 3x10 RTB Shoulder ext 3x10 RTB Manual Therapy: R GH grade 3 mobs: distraction, inf glide, PA, AP  [facility] Adult PT Treatment:                                                DATE: 06/08/24 Therapeutic Exercise: Therapeutic Exercise: Seated pulley for flexion and scaption Wall slides x10 Ball on wall circles CW/CCW Standing bilat ER c YTB  3x10 Supine shoulder press-plus serratus 3x10 3# S/L shoulder ER 3x10 1# S/L shoulder adb 3x10 1#    PATIENT EDUCATION: Education details: Eval  findings, POC, HEP, self care  Person educated: Patient Education method: Explanation, Demonstration, Tactile cues, Verbal cues, and Handouts Education comprehension: verbalized understanding, returned demonstration, verbal cues required, and tactile cues required  HOME EXERCISE PROGRAM: Access Code: [email or web address] URL: [email or web address] Date: 05/15/2024 Prepared by: Dasie Daft  Exercises - Seated Scapular Retraction  - 2 x daily - 7 x weekly - 1 sets - 10 reps - 5 hold - Seated Shoulder Flexion Towel Slide at Table Top  - 2 x daily - 7 x weekly - 1 sets - 10 reps - 5 hold - Seated Shoulder Scaption Slide  at Table Top with Forearm in Neutral (Mirrored)  - 2 x daily - 7 x weekly - 1 sets - 10 reps - 5 hold - Seated Shoulder External Rotation PROM on Table (Mirrored)  - 2 x daily - 7 x weekly - 1 sets - 10 reps - 5 hold - Supine Shoulder Flexion AAROM with Hands Clasped  - 2 x daily - 7 x weekly - 1 sets - 10 reps - 5 hold - Supine Shoulder External Rotation in 45 Degrees Abduction AAROM with Dowel  - 2 x daily - 7 x weekly - 1 sets - 10 reps - 5 hold - Isometric Shoulder Flexion at Wall  - 2 x daily - 7 x weekly - 1 sets - 5 reps - 5 hold - Isometric Shoulder Extension at Wall  - 1 x daily - 7 x weekly - 1 sets - 5 reps - 5 hold - Isometric Shoulder Abduction at Wall  - 1 x daily - 7 x weekly - 1 sets - 10 reps - 10 hold - Standing Isometric Shoulder Internal Rotation at Doorway (Mirrored)  - 1 x daily - 7 x weekly - 1 sets - 10 reps - 10 hold - Standing Isometric Shoulder External Rotation with Doorway (Mirrored)  - 1 x daily - 7 x weekly - 1 sets - 10 reps - 10 hold - Standing Shoulder Row with Anchored Resistance  - 1 x daily - 7 x weekly - 2 sets - 10 reps - 10 hold Added 06/22/24 - Shoulder Flexion Wall Slide with Towel  - 1 x daily - 7 x weekly - 2-3 sets - 10 reps - Sleeper Stretch  - 1 x daily - 7 x weekly - 1 sets - 3-5 reps - 20-30 hold - Standing Shoulder External  Rotation Stretch in Doorway  - 1 x daily - 7 x weekly - 3 sets - 3-5 reps - 20-30 hold Added 06/27/24 - Supine PNF D2 Flexion with Resistance  - 1 x daily - 7 x weekly - 2 sets - 10 reps   ASSESSMENT:  CLINICAL IMPRESSION: PT today focused on R shoulder ROM and for RC and periscapular strengthening. R shoulder girdle shrugging is present c active shoulder flexion and abduction.  Worked on scapular mobs and scapular strengthening. Continue manual and mobs, PROM. Slow improvement.    OBJECTIVE IMPAIRMENTS: decreased activity tolerance, decreased ROM, decreased strength, impaired UE functional use, and pain.   ACTIVITY LIMITATIONS: carrying, lifting, bathing, toileting, dressing, reach over head, and caring for others  PARTICIPATION LIMITATIONS: meal prep, cleaning, laundry, driving, shopping, community activity, and occupation  PERSONAL FACTORS: Past/current experiences, Time since onset of injury/illness/exacerbation, and 1 comorbidity: DM2 are also affecting patient's functional outcome.   REHAB POTENTIAL: Good  CLINICAL DECISION MAKING: Evolving/moderate complexity  EVALUATION COMPLEXITY: Moderate   GOALS:  SHORT TERM GOALS: Target date: 05/13/24  Pt will be Ind in an initial HEP  Baseline: started Goal status: MET  2.  Increase R shoulder PROM for flexion to 120d and ER to 65d for appropriate progress with her R shoulder RC rehab  Baseline: 90 and 40 respectively 05/11/24: flexion 120, ER 45  06/27/24 Goal status: Partially met  LONG TERM GOALS: Target date: 08/03/24  Pt will be Ind in a final HEP to maintain achieved LOF Baseline:  Goal status: ONGOING  2.  Pt will demonstrate AROM for the R shoulder to flex=130, abd=120, ER to T1 and IR to L4 for needed functional ROM  for daily activities Baseline:  06/12/24: See flow sheets Goal status: IMPROVED  3.  Pt will report 50% or greater improvement in her r shoulder pain for improved function and QOL Baseline:  8/10 06/12/24: 85% improvement in R shoulder pain Goal status: MET  5.  Pt's Quick Dash score will improve to 69% or better as indication of improved function  Baseline: 84% Goal status: ONGOING  6.  PT will developed advanced R shoulder LTGs as appropriate for her recovery Baseline:  Goal status: MET  7. Pt will be able to reach overhead c a 2# weight for improved funcitonal use of there R arm with daily activities Baseline: Unable Goal status: INITIAL as of 06/12/24  PLAN:  PT FREQUENCY: 2x/week  PT DURATION: 6 weeks  PLANNED INTERVENTIONS: 97164- PT Re-evaluation, 97110-Therapeutic exercises, 97530- Therapeutic activity, 97112- Neuromuscular re-education, 97535- Self Care, 02859- Manual therapy, [id number]- Electrical stimulation (unattended), 97016- Vasopneumatic device, 20560 (1-2 muscles), 20561 (3+ muscles)- Dry Needling, Patient/Family education, Taping, Joint mobilization, Cryotherapy, and Moist heat  PLAN FOR NEXT SESSION: Assess response to HEP; progress therex as indicated; use of modalities, manual therapy; and TPDN as indicated.  Harlene Persons, PTA 07/06/24 10:37 AM Phone: [phone] Fax: [phone]

## 2024-07-09 ENCOUNTER — Ambulatory Visit: Admitting: Physical Therapy

## 2024-07-09 ENCOUNTER — Telehealth: Payer: Self-pay | Admitting: Family Medicine

## 2024-07-09 NOTE — Telephone Encounter (Signed)
 Due to a change in our office schedule, your appointment must be changed It is very important that we reach you to reschedule this appointment on 07/17/2024 if pt calls back resch appt

## 2024-07-11 ENCOUNTER — Ambulatory Visit: Admitting: Physical Therapy

## 2024-07-12 NOTE — Therapy (Addendum)
 OUTPATIENT PHYSICAL THERAPY SHOULDER TREATMENT/DC   Patient Name: Amanda Garner MRN: [id number] DOB:[birth year], 53 y.o., female Today's Date: 07/13/2024  END OF SESSION:  PT End of Session - 07/13/24 0902     Visit Number 19    Date for Recertification  08/03/24    Authorization Type [employer] OTHER    Authorization - Visit Number 18    PT Start Time [phone]    PT Stop Time 0933    PT Time Calculation (min) 43 min    Activity Tolerance Patient tolerated treatment well    Behavior During Therapy WFL for tasks assessed/performed                        Past Medical History:  Diagnosis Date   Acid reflux    Blood transfusion    Blood transfusion without reported diagnosis    COPD (chronic obstructive pulmonary disease) (HCC)    Diabetes mellitus without complication (HCC)    Diverticulitis    Hypertension    Sickle cell trait    Past Surgical History:  Procedure Laterality Date   ABDOMINAL HYSTERECTOMY  2001   partial hysterectomy - emergency during last C-section   BICEPT TENODESIS Right 03/19/2024   Procedure: TENODESIS, BICEPS OPEN;  Surgeon: Addie Cordella Hamilton, MD;  Location: [facility];  Service: Orthopedics;  Laterality: Right;   CESAREAN SECTION  1991; 1997; 2001   CESAREAN SECTION     COLON SURGERY  11/04/2021   FLEXIBLE SIGMOIDOSCOPY N/A 11/04/2021   Procedure: FLEXIBLE SIGMOIDOSCOPY;  Surgeon: Teresa Lonni HERO, MD;  Location: [facility];  Service: General;  Laterality: N/A;   POSTERIOR LUMBAR FUSION 2 WITH HARDWARE REMOVAL Right 03/19/2024   Procedure: ARTHROSCOPY, SHOULDER WITH DEBRIDEMENT;  Surgeon: Addie Cordella Hamilton, MD;  Location: [facility];  Service: Orthopedics;  Laterality: Right;  right shoulder arthroscopy, debridement, mini open biceps tenodesis and rotator cuff tear repair, mua   SHOULDER OPEN ROTATOR CUFF REPAIR Right 03/19/2024   Procedure: REPAIR, ROTATOR CUFF, OPEN;  Surgeon: Addie Cordella Hamilton,  MD;  Location: [facility];  Service: Orthopedics;  Laterality: Right;   SMALL INTESTINE SURGERY  11/04/2021   XI ROBOTIC ASSISTED LOWER ANTERIOR RESECTION N/A 11/04/2021   Procedure: XI ROBOTIC ASSISTED LOWER ANTERIOR RESECTION, RESECTION OF COLOVAGINAL FISTULA, BILATERAL TAP BLOCK, INJECTION OF FIREFLY;  Surgeon: Teresa Lonni HERO, MD;  Location: [facility];  Service: General;  Laterality: N/A;   Patient Active Problem List   Diagnosis Date Noted   Complete tear of right rotator cuff 03/24/2024   Biceps tendonitis on right 03/24/2024   Degenerative superior labral anterior-to-posterior (SLAP) tear of right shoulder 03/24/2024   Synovitis of right shoulder 03/24/2024   COPD exacerbation (HCC) 11/21/2023   Prolonged QT interval 11/21/2023   Lactic acidosis 11/21/2023   Thyroid  nodule 11/21/2023   Type 2 diabetes mellitus (HCC) 11/05/2021   GERD (gastroesophageal reflux disease) 11/05/2021   S/P laparoscopic-assisted sigmoidectomy 11/04/2021   Abnormal CT scan 08/05/2021   Hx of diverticulitis of colon 08/05/2021   Colovaginal fistula 07/03/2021   Acquired palmar and plantar hyperkeratosis 01/25/2021   Hyperglycemia 02/01/2020   Crohn's disease of large intestine with abscess (HCC) 01/31/2020   Acute diverticulitis 01/09/2020   Diverticulitis of large intestine with abscess without bleeding 06/07/2017   Abscess of sigmoid colon due to diverticulitis 05/24/2017   Tobacco dependence 05/11/2017   Essential hypertension 05/11/2017   Reflux esophagitis 05/11/2017   Tinea pedis of left  foot 04/11/2015   C. difficile colitis 08/26/2011   Enteritis 08/25/2011    PCP: Delbert Clam, MD   REFERRING PROVIDER: Shirly Carlin CROME, PA-C   REFERRING DIAG: [phone] (ICD-10-CM) - S/P right rotator cuff repair   THERAPY DIAG:  Acute pain of right shoulder  Muscle weakness (generalized)  Stiffness of right shoulder, not elsewhere classified  Rationale for Evaluation and  Treatment: Rehabilitation  ONSET DATE: 03/19/24  SUBJECTIVE:                                                                                                                                                                                      SUBJECTIVE STATEMENT: Pt reports she is able to lift her R arm with greater ease.  EVAL: Pt reports her R shoulder is sore, but is improving since surgery. Pt notes the sling has been Dced, but to can wear in public as needed. Initially injured fer R shoulder during a fall backwards.  Hand dominance: Right  PERTINENT HISTORY: See above, COPD, DM2  PAIN:  Are you having pain? Yes: NPRS scale: 0/10  Pain location: anterior R shoulder Pain description: throb, ache, sharp Aggravating factors: When pain meds wear off Relieving factors: Rest  PRECAUTIONS: Shoulder no lifting  05/11/24: Per visit with Dr. Addie- OK for strengthening and ROM 3x/wk for 6wks  RED FLAGS: None   WEIGHT BEARING RESTRICTIONS: Yes no pulling or pushing  FALLS:  Has patient fallen in last 6 months? 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  PATIENT SURVEYS:  Quick Dash: 84%  Minimally Clinically Important Difference (MCID): 15-20 points  COGNITION: Overall cognitive status: Within functional limits for tasks assessed     SENSATION: WFL  POSTURE: Forward head and rounded shoulders  UPPER EXTREMITY ROM:   Passive ROM Right eval Left eval Rt 04/25/24 Rt 05/11/24 Rt 05/17/24 Rt 05/25/24 Rt 05/29/24 Rt 06/08/24 Rt 06/12/24 RT 06/27/24  Shoulder flexion 90  110d 120 120 p 120AA 125AA 95A 125AA 130 PROM  Shoulder extension            Shoulder  abduction 75   80 pain        Shoulder adduction            Shoulder internal rotation            Shoulder external rotation 40  45d 45  45 p 45AA   45AA 45 PROM  Elbow flexion            Elbow extension            Wrist flexion            Wrist extension            Wrist ulnar deviation            Wrist radial deviation            Wrist pronation            Wrist supination            (Blank rows = not tested)  UPPER EXTREMITY MMT:  NT MMT Right eval Left eval  Shoulder flexion    Shoulder extension    Shoulder abduction    Shoulder adduction    Shoulder internal rotation    Shoulder external rotation    Middle trapezius    Lower trapezius    Elbow flexion    Elbow extension    Wrist flexion    Wrist extension    Wrist ulnar deviation    Wrist radial deviation    Wrist pronation    Wrist supination    Grip strength (lbs)    (Blank rows = not tested)  SHOULDER SPECIAL TESTS: NT  JOINT MOBILITY TESTING:  WNLs  PALPATION:  TTP to the L peri-GH area                                                                                                                       TREATMENT DATE:  [facility] Adult PT Treatment:                                                DATE: 07/13/24 Therapeutic ExercisActivity: UBE Level 1, 2 min each way  Shoulder ladder flexion and abd x3 20 Wall and counter push ups x10 each Shoulder ER at wall 3x10 RTB Supine RTB star pattern 3x10  Shoulder flexion 2# and scaption 1# 2x10 each Manual Therapy: A/P and inferior glides to right shoulder  PROM right shoulder  [facility] Adult PT Treatment:                                                DATE: 07/06/24 Therapeutic Exercise: Supine GTB star pattern 15 x 2  Side lying Right shoulder abduction to horiz addct to extension 2# x 10 with manual scap guidance  Prone 2# row prone 2# x 10 each  Prone ball on floor circles CW / CCW Qped rocking onto UE x 10 ROW and Pull down Blue x 15 each  Wall W backs  10 x 2 AROM  Wall angels x 5  Manual Therapy: A/p and inferior glides to right shoulder  PROM right shoulder Manual scap assistance with SL shoulder movements  [facility] Adult PT Treatment:                                                DATE: 07/04/24 Therapeutic Exercise: Supine GTB horiz abdct 15 x 2  Supine right PNF/ diagoanl red red band  Side lying Right shoulder abduction  ROW blue band  Standing horiz abdct YTB attached  12 x 2  Standng at wall alternating diagonal AROM 10 x 2  Wall W backs 10 x 2 AROM  Manual Therapy: A/p and inferior glides to right shoulder  PROM right shoulder Manual scap assistance with SL shoulder movements STW/TPR along medial border of right scapula  PATIENT EDUCATION: Education details: Eval findings, POC, HEP, self care  Person educated: Patient Education method: Explanation, Demonstration, Tactile cues, Verbal cues, and Handouts Education comprehension: verbalized understanding, returned demonstration, verbal cues required, and tactile cues required  HOME EXERCISE PROGRAM: Access Code: [email or web address] URL: [email or web address] Date: 05/15/2024 Prepared by: Dasie Daft  Exercises - Seated Scapular Retraction  - 2 x daily - 7 x weekly - 1 sets - 10 reps - 5 hold - Seated Shoulder Flexion Towel Slide at Table Top  - 2 x daily - 7 x weekly - 1 sets - 10 reps - 5 hold - Seated Shoulder Scaption Slide at Table Top with Forearm in Neutral (Mirrored)  - 2 x daily - 7 x weekly - 1 sets - 10 reps - 5 hold - Seated Shoulder External Rotation PROM on Table (Mirrored)  - 2 x daily - 7 x weekly - 1 sets - 10 reps - 5 hold - Supine Shoulder Flexion AAROM with Hands Clasped  - 2 x daily - 7 x weekly - 1 sets - 10 reps - 5 hold - Supine Shoulder External Rotation in 45 Degrees Abduction AAROM with Dowel  - 2 x daily - 7 x weekly - 1 sets - 10 reps - 5 hold - Isometric Shoulder Flexion at Wall  - 2 x daily - 7 x weekly - 1 sets - 5 reps - 5 hold -  Isometric Shoulder Extension at Wall  - 1 x daily - 7 x weekly - 1 sets - 5 reps - 5 hold - Isometric Shoulder Abduction at Wall  - 1 x daily - 7 x weekly - 1 sets - 10 reps - 10 hold - Standing Isometric Shoulder Internal Rotation at Doorway (Mirrored)  - 1 x daily - 7 x weekly - 1 sets - 10 reps - 10 hold - Standing Isometric Shoulder External Rotation with Doorway (Mirrored)  - 1 x daily - 7 x weekly - 1 sets - 10 reps - 10 hold - Standing Shoulder Row with Anchored Resistance  - 1 x daily - 7 x weekly - 2 sets - 10 reps - 10 hold Added 06/22/24 - Shoulder Flexion Wall Slide with Towel  - 1 x daily - 7 x weekly - 2-3 sets - 10 reps - Sleeper Stretch  - 1 x daily - 7 x weekly - 1 sets - 3-5 reps - 20-30 hold - Standing Shoulder External Rotation Stretch in Doorway  - 1 x daily - 7 x weekly - 3 sets - 3-5 reps -  20-30 hold Added 06/27/24 - Supine PNF D2 Flexion with Resistance  - 1 x daily - 7 x weekly - 2 sets - 10 reps   ASSESSMENT:  CLINICAL IMPRESSION: PT was completed to address GH ROM and RC and peri-scapular strength. With flexion, pt's quality of motion is improved with less shrugging. Pt tolerated prescribed exs without adverse effects. Pt continues to make gradual progress with her R shoulder RC rehab.    OBJECTIVE IMPAIRMENTS: decreased activity tolerance, decreased ROM, decreased strength, impaired UE functional use, and pain.   ACTIVITY LIMITATIONS: carrying, lifting, bathing, toileting, dressing, reach over head, and caring for others  PARTICIPATION LIMITATIONS: meal prep, cleaning, laundry, driving, shopping, community activity, and occupation  PERSONAL FACTORS: Past/current experiences, Time since onset of injury/illness/exacerbation, and 1 comorbidity: DM2 are also affecting patient's functional outcome.   REHAB POTENTIAL: Good  CLINICAL DECISION MAKING: Evolving/moderate complexity  EVALUATION COMPLEXITY: Moderate   GOALS:  SHORT TERM GOALS: Target date:  05/13/24  Pt will be Ind in an initial HEP  Baseline: started Goal status: MET  2.  Increase R shoulder PROM for flexion to 120d and ER to 65d for appropriate progress with her R shoulder RC rehab  Baseline: 90 and 40 respectively 05/11/24: flexion 120, ER 45  06/27/24 Goal status: Partially met  LONG TERM GOALS: Target date: 08/03/24  Pt will be Ind in a final HEP to maintain achieved LOF Baseline:  Goal status: ONGOING  2.  Pt will demonstrate AROM for the R shoulder to flex=130, abd=120, ER to T1 and IR to L4 for needed functional ROM for daily activities Baseline:  06/12/24: See flow sheets Goal status: IMPROVED  3.  Pt will report 50% or greater improvement in her r shoulder pain for improved function and QOL Baseline: 8/10 06/12/24: 85% improvement in R shoulder pain Goal status: MET  5.  Pt's Quick Dash score will improve to 69% or better as indication of improved function  Baseline: 84% Goal status: ONGOING  6.  PT will developed advanced R shoulder LTGs as appropriate for her recovery Baseline:  Goal status: MET  7. Pt will be able to reach overhead c a 2# weight for improved funcitonal use of there R arm with daily activities Baseline: Unable Goal status: INITIAL as of 06/12/24  PLAN:  PT FREQUENCY: 2x/week  PT DURATION: 6 weeks  PLANNED INTERVENTIONS: 97164- PT Re-evaluation, 97110-Therapeutic exercises, 97530- Therapeutic activity, 97112- Neuromuscular re-education, 97535- Self Care, 02859- Manual therapy, [id number]- Electrical stimulation (unattended), 97016- Vasopneumatic device, 20560 (1-2 muscles), 20561 (3+ muscles)- Dry Needling, Patient/Family education, Taping, Joint mobilization, Cryotherapy, and Moist heat  PLAN FOR NEXT SESSION: Assess response to HEP; progress therex as indicated; use of modalities, manual therapy; and TPDN as indicated.  Mei Suits MS, PT 07/13/24 10:52 AM   PHYSICAL THERAPY DISCHARGE SUMMARY  Visits from Start of Care:  19  Current functional level related to goals / functional outcomes: See clinical impression and PT goals    Remaining deficits: See clinical impression and PT goals   Education / Equipment: HEP/Pt Ed   Patient agrees to discharge. Patient goals were Majority of goals met or status improved. Patient is being discharged due to Pt no showed her last 3 appts.

## 2024-07-13 ENCOUNTER — Ambulatory Visit

## 2024-07-13 DIAGNOSIS — M6281 Muscle weakness (generalized): Secondary | ICD-10-CM

## 2024-07-13 DIAGNOSIS — M25611 Stiffness of right shoulder, not elsewhere classified: Secondary | ICD-10-CM

## 2024-07-13 DIAGNOSIS — M25511 Pain in right shoulder: Secondary | ICD-10-CM

## 2024-07-17 ENCOUNTER — Ambulatory Visit: Admitting: Family Medicine

## 2024-07-25 ENCOUNTER — Telehealth: Payer: Self-pay | Admitting: Physical Therapy

## 2024-07-25 ENCOUNTER — Ambulatory Visit: Attending: Surgical | Admitting: Physical Therapy

## 2024-07-25 NOTE — Telephone Encounter (Signed)
 Left voicemail regarding missed appointment. Left next appointment date and time.

## 2024-07-26 ENCOUNTER — Other Ambulatory Visit: Payer: Self-pay | Admitting: Medical Genetics

## 2024-07-26 NOTE — Therapy (Incomplete)
 OUTPATIENT PHYSICAL THERAPY SHOULDER TREATMENT   Patient Name: Amanda Garner MRN: [id number] DOB:09/06/1970, 53 y.o., female Today's Date: 07/26/2024  END OF SESSION:                  Past Medical History:  Diagnosis Date   Acid reflux    Blood transfusion    Blood transfusion without reported diagnosis    COPD (chronic obstructive pulmonary disease) (HCC)    Diabetes mellitus without complication (HCC)    Diverticulitis    Hypertension    Sickle cell trait    Past Surgical History:  Procedure Laterality Date   ABDOMINAL HYSTERECTOMY  2001   partial hysterectomy - emergency during last C-section   BICEPT TENODESIS Right 03/19/2024   Procedure: TENODESIS, BICEPS OPEN;  Surgeon: Addie Cordella Hamilton, MD;  Location: [facility];  Service: Orthopedics;  Laterality: Right;   CESAREAN SECTION  1991; 1997; 2001   CESAREAN SECTION     COLON SURGERY  11/04/2021   FLEXIBLE SIGMOIDOSCOPY N/A 11/04/2021   Procedure: FLEXIBLE SIGMOIDOSCOPY;  Surgeon: Teresa Lonni HERO, MD;  Location: [facility];  Service: General;  Laterality: N/A;   POSTERIOR LUMBAR FUSION 2 WITH HARDWARE REMOVAL Right 03/19/2024   Procedure: ARTHROSCOPY, SHOULDER WITH DEBRIDEMENT;  Surgeon: Addie Cordella Hamilton, MD;  Location: [facility];  Service: Orthopedics;  Laterality: Right;  right shoulder arthroscopy, debridement, mini open biceps tenodesis and rotator cuff tear repair, mua   SHOULDER OPEN ROTATOR CUFF REPAIR Right 03/19/2024   Procedure: REPAIR, ROTATOR CUFF, OPEN;  Surgeon: Addie Cordella Hamilton, MD;  Location: [facility];  Service: Orthopedics;  Laterality: Right;   SMALL INTESTINE SURGERY  11/04/2021   XI ROBOTIC ASSISTED LOWER ANTERIOR RESECTION N/A 11/04/2021   Procedure: XI ROBOTIC ASSISTED LOWER ANTERIOR RESECTION, RESECTION OF COLOVAGINAL FISTULA, BILATERAL TAP BLOCK, INJECTION OF FIREFLY;  Surgeon: Teresa Lonni HERO, MD;  Location: [facility];  Service:  General;  Laterality: N/A;   Patient Active Problem List   Diagnosis Date Noted   Complete tear of right rotator cuff 03/24/2024   Biceps tendonitis on right 03/24/2024   Degenerative superior labral anterior-to-posterior (SLAP) tear of right shoulder 03/24/2024   Synovitis of right shoulder 03/24/2024   COPD exacerbation (HCC) 11/21/2023   Prolonged QT interval 11/21/2023   Lactic acidosis 11/21/2023   Thyroid  nodule 11/21/2023   Type 2 diabetes mellitus (HCC) 11/05/2021   GERD (gastroesophageal reflux disease) 11/05/2021   S/P laparoscopic-assisted sigmoidectomy 11/04/2021   Abnormal CT scan 08/05/2021   Hx of diverticulitis of colon 08/05/2021   Colovaginal fistula 07/03/2021   Acquired palmar and plantar hyperkeratosis 01/25/2021   Hyperglycemia 02/01/2020   Crohn's disease of large intestine with abscess (HCC) 01/31/2020   Acute diverticulitis 01/09/2020   Diverticulitis of large intestine with abscess without bleeding 06/07/2017   Abscess of sigmoid colon due to diverticulitis 05/24/2017   Tobacco dependence 05/11/2017   Essential hypertension 05/11/2017   Reflux esophagitis 05/11/2017   Tinea pedis of left foot 04/11/2015   C. difficile colitis 08/26/2011   Enteritis 08/25/2011    PCP: Delbert Clam, MD   REFERRING PROVIDER: Shirly Carlin CROME, PA-C   REFERRING DIAG: [phone] (ICD-10-CM) - S/P right rotator cuff repair   THERAPY DIAG:  No diagnosis found.  Rationale for Evaluation and Treatment: Rehabilitation  ONSET DATE: 03/19/24  SUBJECTIVE:  SUBJECTIVE STATEMENT: Pt reports she is able to lift her R arm with greater ease.  EVAL: Pt reports her R shoulder is sore, but is improving since surgery. Pt notes the sling has been Dced, but to can wear in public as needed. Initially  injured fer R shoulder during a fall backwards.  Hand dominance: Right  PERTINENT HISTORY: See above, COPD, DM2  PAIN:  Are you having pain? Yes: NPRS scale: 0/10  Pain location: anterior R shoulder Pain description: throb, ache, sharp Aggravating factors: When pain meds wear off Relieving factors: Rest  PRECAUTIONS: Shoulder no lifting  05/11/24: Per visit with Dr. Addie- OK for strengthening and ROM 3x/wk for 6wks  RED FLAGS: None   WEIGHT BEARING RESTRICTIONS: Yes no pulling or pushing  FALLS:  Has patient fallen in last 6 months? 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  PATIENT SURVEYS:  Quick Dash: 84%  Minimally Clinically Important Difference (MCID): 15-20 points  COGNITION: Overall cognitive status: Within functional limits for tasks assessed     SENSATION: WFL  POSTURE: Forward head and rounded shoulders  UPPER EXTREMITY ROM:   Passive ROM Right eval Left eval Rt 04/25/24 Rt 05/11/24 Rt 05/17/24 Rt 05/25/24 Rt 05/29/24 Rt 06/08/24 Rt 06/12/24 RT 06/27/24  Shoulder flexion 90  110d 120 120 p 120AA 125AA 95A 125AA 130 PROM  Shoulder extension            Shoulder abduction 75   80 pain        Shoulder adduction            Shoulder internal rotation            Shoulder external rotation 40  45d 45 45 p 45AA   45AA 45 PROM  Elbow flexion            Elbow extension            Wrist flexion            Wrist extension            Wrist ulnar deviation            Wrist radial deviation            Wrist pronation            Wrist supination            (Blank rows = not tested)  UPPER EXTREMITY MMT:  NT MMT Right eval Left eval   Shoulder flexion    Shoulder extension    Shoulder abduction    Shoulder adduction    Shoulder internal rotation    Shoulder external rotation    Middle trapezius    Lower trapezius    Elbow flexion    Elbow extension    Wrist flexion    Wrist extension    Wrist ulnar deviation    Wrist radial deviation    Wrist pronation    Wrist supination    Grip strength (lbs)    (Blank rows = not tested)  SHOULDER SPECIAL TESTS: NT  JOINT MOBILITY TESTING:  WNLs  PALPATION:  TTP to the L peri-GH area  TREATMENT DATE:  [facility] Adult PT Treatment:                                                DATE: 07/27/24 Therapeutic ExercisActivity: UBE Level 1, 2 min each way  Shoulder ladder flexion and abd x3 20 Wall and counter push ups x10 each Shoulder ER at wall 3x10 RTB Supine RTB star pattern 3x10  Shoulder flexion 2# and scaption 1# 2x10 each Manual Therapy: A/P and inferior glides to right shoulder  PROM right shoulder Therapeutic Exercise: *** Manual Therapy: *** Neuromuscular re-ed: *** Therapeutic Activity: *** Modalities: *** Self Care: ***  [facility] Adult PT Treatment:                                                DATE: 07/13/24 Therapeutic ExercisActivity: UBE Level 1, 2 min each way  Shoulder ladder flexion and abd x3 20 Wall and counter push ups x10 each Shoulder ER at wall 3x10 RTB Supine RTB star pattern 3x10  Shoulder flexion 2# and scaption 1# 2x10 each Manual Therapy: A/P and inferior glides to right shoulder  PROM right shoulder  [facility] Adult PT Treatment:                                                DATE: 07/06/24 Therapeutic Exercise: Supine GTB star pattern 15 x 2  Side lying Right shoulder abduction to horiz addct to extension 2# x 10 with manual scap guidance  Prone 2# row prone 2# x 10 each  Prone ball on floor circles CW /  CCW Qped rocking onto UE x 10 ROW and Pull down Blue x 15 each  Wall W backs 10 x 2 AROM  Wall angels x 5  Manual Therapy: A/p and inferior glides to right shoulder  PROM right shoulder Manual scap assistance with SL shoulder movements  [facility] Adult PT Treatment:                                                DATE: 07/04/24 Therapeutic Exercise: Supine GTB horiz abdct 15 x 2  Supine right PNF/ diagoanl red red band  Side lying Right shoulder abduction  ROW blue band  Standing horiz abdct YTB attached  12 x 2  Standng at wall alternating diagonal AROM 10 x 2  Wall W backs 10 x 2 AROM  Manual Therapy: A/p and inferior glides to right shoulder  PROM right shoulder Manual scap assistance with SL shoulder movements STW/TPR along medial border of right scapula  PATIENT EDUCATION: Education details: Eval findings, POC, HEP, self care  Person educated: Patient Education method: Explanation, Demonstration, Tactile cues, Verbal cues, and Handouts Education comprehension: verbalized understanding, returned demonstration, verbal cues required, and tactile cues required  HOME EXERCISE PROGRAM: Access Code: [email or web address] URL: [email or web address] Date: 05/15/2024 Prepared by: Dasie Daft  Exercises - Seated Scapular Retraction  - 2 x daily - 7 x weekly - 1 sets - 10 reps -  5 hold - Seated Shoulder Flexion Towel Slide at Table Top  - 2 x daily - 7 x weekly - 1 sets - 10 reps - 5 hold - Seated Shoulder Scaption Slide at Table Top with Forearm in Neutral (Mirrored)  - 2 x daily - 7 x weekly - 1 sets - 10 reps - 5 hold - Seated Shoulder External Rotation PROM on Table (Mirrored)  - 2 x daily - 7 x weekly - 1 sets - 10 reps - 5 hold - Supine Shoulder Flexion AAROM with Hands Clasped  - 2 x daily - 7 x weekly - 1 sets - 10 reps - 5 hold - Supine Shoulder External Rotation in 45 Degrees Abduction AAROM with Dowel  - 2 x daily - 7 x weekly - 1 sets - 10 reps - 5 hold - Isometric  Shoulder Flexion at Wall  - 2 x daily - 7 x weekly - 1 sets - 5 reps - 5 hold - Isometric Shoulder Extension at Wall  - 1 x daily - 7 x weekly - 1 sets - 5 reps - 5 hold - Isometric Shoulder Abduction at Wall  - 1 x daily - 7 x weekly - 1 sets - 10 reps - 10 hold - Standing Isometric Shoulder Internal Rotation at Doorway (Mirrored)  - 1 x daily - 7 x weekly - 1 sets - 10 reps - 10 hold - Standing Isometric Shoulder External Rotation with Doorway (Mirrored)  - 1 x daily - 7 x weekly - 1 sets - 10 reps - 10 hold - Standing Shoulder Row with Anchored Resistance  - 1 x daily - 7 x weekly - 2 sets - 10 reps - 10 hold Added 06/22/24 - Shoulder Flexion Wall Slide with Towel  - 1 x daily - 7 x weekly - 2-3 sets - 10 reps - Sleeper Stretch  - 1 x daily - 7 x weekly - 1 sets - 3-5 reps - 20-30 hold - Standing Shoulder External Rotation Stretch in Doorway  - 1 x daily - 7 x weekly - 3 sets - 3-5 reps - 20-30 hold Added 06/27/24 - Supine PNF D2 Flexion with Resistance  - 1 x daily - 7 x weekly - 2 sets - 10 reps   ASSESSMENT:  CLINICAL IMPRESSION: PT was completed to address GH ROM and RC and peri-scapular strength. With flexion, pt's quality of motion is improved with less shrugging. Pt tolerated prescribed exs without adverse effects. Pt continues to make gradual progress with her R shoulder RC rehab.    OBJECTIVE IMPAIRMENTS: decreased activity tolerance, decreased ROM, decreased strength, impaired UE functional use, and pain.   ACTIVITY LIMITATIONS: carrying, lifting, bathing, toileting, dressing, reach over head, and caring for others  PARTICIPATION LIMITATIONS: meal prep, cleaning, laundry, driving, shopping, community activity, and occupation  PERSONAL FACTORS: Past/current experiences, Time since onset of injury/illness/exacerbation, and 1 comorbidity: DM2 are also affecting patient's functional outcome.   REHAB POTENTIAL: Good  CLINICAL DECISION MAKING: Evolving/moderate  complexity  EVALUATION COMPLEXITY: Moderate   GOALS:  SHORT TERM GOALS: Target date: 05/13/24  Pt will be Ind in an initial HEP  Baseline: started Goal status: MET  2.  Increase R shoulder PROM for flexion to 120d and ER to 65d for appropriate progress with her R shoulder RC rehab  Baseline: 90 and 40 respectively 05/11/24: flexion 120, ER 45  06/27/24 Goal status: Partially met  LONG TERM GOALS: Target date: 08/03/24  Pt will be Ind in  a final HEP to maintain achieved LOF Baseline:  Goal status: ONGOING  2.  Pt will demonstrate AROM for the R shoulder to flex=130, abd=120, ER to T1 and IR to L4 for needed functional ROM for daily activities Baseline:  06/12/24: See flow sheets Goal status: IMPROVED  3.  Pt will report 50% or greater improvement in her r shoulder pain for improved function and QOL Baseline: 8/10 06/12/24: 85% improvement in R shoulder pain Goal status: MET  5.  Pt's Quick Dash score will improve to 69% or better as indication of improved function  Baseline: 84% Goal status: ONGOING  6.  PT will developed advanced R shoulder LTGs as appropriate for her recovery Baseline:  Goal status: MET  7. Pt will be able to reach overhead c a 2# weight for improved funcitonal use of there R arm with daily activities Baseline: Unable Goal status: INITIAL as of 06/12/24  PLAN:  PT FREQUENCY: 2x/week  PT DURATION: 6 weeks  PLANNED INTERVENTIONS: 97164- PT Re-evaluation, 97110-Therapeutic exercises, 97530- Therapeutic activity, 97112- Neuromuscular re-education, 97535- Self Care, [id number]- Manual therapy, [id number]- Electrical stimulation (unattended), 97016- Vasopneumatic device, 20560 (1-2 muscles), 20561 (3+ muscles)- Dry Needling, Patient/Family education, Taping, Joint mobilization, Cryotherapy, and Moist heat  PLAN FOR NEXT SESSION: Assess response to HEP; progress therex as indicated; use of modalities, manual therapy; and TPDN as indicated.  Macyn Remmert MS,  PT 07/26/24 9:19 PM

## 2024-07-27 ENCOUNTER — Ambulatory Visit

## 2024-07-31 ENCOUNTER — Telehealth: Payer: Self-pay

## 2024-07-31 NOTE — Telephone Encounter (Signed)
 Spoke with pt re: no show appt on 07/27/24. Pt has one more appt and she was reminded of this appt

## 2024-08-01 ENCOUNTER — Ambulatory Visit: Admitting: Physical Therapy

## 2024-08-02 NOTE — Therapy (Deleted)
 OUTPATIENT PHYSICAL THERAPY SHOULDER TREATMENT   Patient Name: Amanda Garner MRN: [id number] DOB:1971/01/06, 53 y.o., female Today's Date: 08/02/2024  END OF SESSION:                  Past Medical History:  Diagnosis Date   Acid reflux    Blood transfusion    Blood transfusion without reported diagnosis    COPD (chronic obstructive pulmonary disease) (HCC)    Diabetes mellitus without complication (HCC)    Diverticulitis    Hypertension    Sickle cell trait    Past Surgical History:  Procedure Laterality Date   ABDOMINAL HYSTERECTOMY  2001   partial hysterectomy - emergency during last C-section   BICEPT TENODESIS Right 03/19/2024   Procedure: TENODESIS, BICEPS OPEN;  Surgeon: Addie Cordella Hamilton, MD;  Location: [facility];  Service: Orthopedics;  Laterality: Right;   CESAREAN SECTION  1991; 1997; 2001   CESAREAN SECTION     COLON SURGERY  11/04/2021   FLEXIBLE SIGMOIDOSCOPY N/A 11/04/2021   Procedure: FLEXIBLE SIGMOIDOSCOPY;  Surgeon: Teresa Lonni HERO, MD;  Location: [facility];  Service: General;  Laterality: N/A;   POSTERIOR LUMBAR FUSION 2 WITH HARDWARE REMOVAL Right 03/19/2024   Procedure: ARTHROSCOPY, SHOULDER WITH DEBRIDEMENT;  Surgeon: Addie Cordella Hamilton, MD;  Location: [facility];  Service: Orthopedics;  Laterality: Right;  right shoulder arthroscopy, debridement, mini open biceps tenodesis and rotator cuff tear repair, mua   SHOULDER OPEN ROTATOR CUFF REPAIR Right 03/19/2024   Procedure: REPAIR, ROTATOR CUFF, OPEN;  Surgeon: Addie Cordella Hamilton, MD;  Location: [facility];  Service: Orthopedics;  Laterality: Right;   SMALL INTESTINE SURGERY  11/04/2021   XI ROBOTIC ASSISTED LOWER ANTERIOR RESECTION N/A 11/04/2021   Procedure: XI ROBOTIC ASSISTED LOWER ANTERIOR RESECTION, RESECTION OF COLOVAGINAL FISTULA, BILATERAL TAP BLOCK, INJECTION OF FIREFLY;  Surgeon: Teresa Lonni HERO, MD;  Location: [facility];  Service:  General;  Laterality: N/A;   Patient Active Problem List   Diagnosis Date Noted   Complete tear of right rotator cuff 03/24/2024   Biceps tendonitis on right 03/24/2024   Degenerative superior labral anterior-to-posterior (SLAP) tear of right shoulder 03/24/2024   Synovitis of right shoulder 03/24/2024   COPD exacerbation (HCC) 11/21/2023   Prolonged QT interval 11/21/2023   Lactic acidosis 11/21/2023   Thyroid  nodule 11/21/2023   Type 2 diabetes mellitus (HCC) 11/05/2021   GERD (gastroesophageal reflux disease) 11/05/2021   S/P laparoscopic-assisted sigmoidectomy 11/04/2021   Abnormal CT scan 08/05/2021   Hx of diverticulitis of colon 08/05/2021   Colovaginal fistula 07/03/2021   Acquired palmar and plantar hyperkeratosis 01/25/2021   Hyperglycemia 02/01/2020   Crohn's disease of large intestine with abscess (HCC) 01/31/2020   Acute diverticulitis 01/09/2020   Diverticulitis of large intestine with abscess without bleeding 06/07/2017   Abscess of sigmoid colon due to diverticulitis 05/24/2017   Tobacco dependence 05/11/2017   Essential hypertension 05/11/2017   Reflux esophagitis 05/11/2017   Tinea pedis of left foot 04/11/2015   C. difficile colitis 08/26/2011   Enteritis 08/25/2011    PCP: Delbert Clam, MD   REFERRING PROVIDER: Shirly Carlin CROME, PA-C   REFERRING DIAG: [phone] (ICD-10-CM) - S/P right rotator cuff repair   THERAPY DIAG:  No diagnosis found.  Rationale for Evaluation and Treatment: Rehabilitation  ONSET DATE: 03/19/24  SUBJECTIVE:  SUBJECTIVE STATEMENT: Pt reports she is able to lift her R arm with greater ease.  EVAL: Pt reports her R shoulder is sore, but is improving since surgery. Pt notes the sling has been Dced, but to can wear in public as needed. Initially  injured fer R shoulder during a fall backwards.  Hand dominance: Right  PERTINENT HISTORY: See above, COPD, DM2  PAIN:  Are you having pain? Yes: NPRS scale: 0/10  Pain location: anterior R shoulder Pain description: throb, ache, sharp Aggravating factors: When pain meds wear off Relieving factors: Rest  PRECAUTIONS: Shoulder no lifting  05/11/24: Per visit with Dr. Addie- OK for strengthening and ROM 3x/wk for 6wks  RED FLAGS: None   WEIGHT BEARING RESTRICTIONS: Yes no pulling or pushing  FALLS:  Has patient fallen in last 6 months? 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  PATIENT SURVEYS:  Quick Dash: 84%  Minimally Clinically Important Difference (MCID): 15-20 points  COGNITION: Overall cognitive status: Within functional limits for tasks assessed     SENSATION: WFL  POSTURE: Forward head and rounded shoulders  UPPER EXTREMITY ROM:   Passive ROM Right eval Left eval Rt 04/25/24 Rt 05/11/24 Rt 05/17/24 Rt 05/25/24 Rt 05/29/24 Rt 06/08/24 Rt 06/12/24 RT 06/27/24  Shoulder flexion 90  110d 120 120 p 120AA 125AA 95A 125AA 130 PROM  Shoulder extension            Shoulder abduction 75   80 pain        Shoulder adduction            Shoulder internal rotation            Shoulder external rotation 40  45d 45 45 p 45AA   45AA 45 PROM  Elbow flexion            Elbow extension            Wrist flexion            Wrist extension            Wrist ulnar deviation            Wrist radial deviation            Wrist pronation            Wrist supination            (Blank rows = not tested)  UPPER EXTREMITY MMT:  NT MMT Right eval Left eval   Shoulder flexion    Shoulder extension    Shoulder abduction    Shoulder adduction    Shoulder internal rotation    Shoulder external rotation    Middle trapezius    Lower trapezius    Elbow flexion    Elbow extension    Wrist flexion    Wrist extension    Wrist ulnar deviation    Wrist radial deviation    Wrist pronation    Wrist supination    Grip strength (lbs)    (Blank rows = not tested)  SHOULDER SPECIAL TESTS: NT  JOINT MOBILITY TESTING:  WNLs  PALPATION:  TTP to the L peri-GH area  TREATMENT DATE:  [facility] Adult PT Treatment:                                                DATE: 08/03/24 Therapeutic ExercisActivity: UBE Level 1, 2 min each way  Shoulder ladder flexion and abd x3 20 Wall and counter push ups x10 each Shoulder ER at wall 3x10 RTB Supine RTB star pattern 3x10  Shoulder flexion 2# and scaption 1# 2x10 each Manual Therapy: A/P and inferior glides to right shoulder  PROM right shoulder Therapeutic Exercise: *** Manual Therapy: *** Neuromuscular re-ed: *** Therapeutic Activity: *** Modalities: *** Self Care: ***  [facility] Adult PT Treatment:                                                DATE: 07/13/24 Therapeutic ExercisActivity: UBE Level 1, 2 min each way  Shoulder ladder flexion and abd x3 20 Wall and counter push ups x10 each Shoulder ER at wall 3x10 RTB Supine RTB star pattern 3x10  Shoulder flexion 2# and scaption 1# 2x10 each Manual Therapy: A/P and inferior glides to right shoulder  PROM right shoulder  [facility] Adult PT Treatment:                                                DATE: 07/06/24 Therapeutic Exercise: Supine GTB star pattern 15 x 2  Side lying Right shoulder abduction to horiz addct to extension 2# x 10 with manual scap guidance  Prone 2# row prone 2# x 10 each  Prone ball on floor circles CW /  CCW Qped rocking onto UE x 10 ROW and Pull down Blue x 15 each  Wall W backs 10 x 2 AROM  Wall angels x 5  Manual Therapy: A/p and inferior glides to right shoulder  PROM right shoulder Manual scap assistance with SL shoulder movements  [facility] Adult PT Treatment:                                                DATE: 07/04/24 Therapeutic Exercise: Supine GTB horiz abdct 15 x 2  Supine right PNF/ diagoanl red red band  Side lying Right shoulder abduction  ROW blue band  Standing horiz abdct YTB attached  12 x 2  Standng at wall alternating diagonal AROM 10 x 2  Wall W backs 10 x 2 AROM  Manual Therapy: A/p and inferior glides to right shoulder  PROM right shoulder Manual scap assistance with SL shoulder movements STW/TPR along medial border of right scapula  PATIENT EDUCATION: Education details: Eval findings, POC, HEP, self care  Person educated: Patient Education method: Explanation, Demonstration, Tactile cues, Verbal cues, and Handouts Education comprehension: verbalized understanding, returned demonstration, verbal cues required, and tactile cues required  HOME EXERCISE PROGRAM: Access Code: [email or web address] URL: [email or web address] Date: 05/15/2024 Prepared by: Dasie Daft  Exercises - Seated Scapular Retraction  - 2 x daily - 7 x weekly - 1 sets - 10 reps -  5 hold - Seated Shoulder Flexion Towel Slide at Table Top  - 2 x daily - 7 x weekly - 1 sets - 10 reps - 5 hold - Seated Shoulder Scaption Slide at Table Top with Forearm in Neutral (Mirrored)  - 2 x daily - 7 x weekly - 1 sets - 10 reps - 5 hold - Seated Shoulder External Rotation PROM on Table (Mirrored)  - 2 x daily - 7 x weekly - 1 sets - 10 reps - 5 hold - Supine Shoulder Flexion AAROM with Hands Clasped  - 2 x daily - 7 x weekly - 1 sets - 10 reps - 5 hold - Supine Shoulder External Rotation in 45 Degrees Abduction AAROM with Dowel  - 2 x daily - 7 x weekly - 1 sets - 10 reps - 5 hold - Isometric  Shoulder Flexion at Wall  - 2 x daily - 7 x weekly - 1 sets - 5 reps - 5 hold - Isometric Shoulder Extension at Wall  - 1 x daily - 7 x weekly - 1 sets - 5 reps - 5 hold - Isometric Shoulder Abduction at Wall  - 1 x daily - 7 x weekly - 1 sets - 10 reps - 10 hold - Standing Isometric Shoulder Internal Rotation at Doorway (Mirrored)  - 1 x daily - 7 x weekly - 1 sets - 10 reps - 10 hold - Standing Isometric Shoulder External Rotation with Doorway (Mirrored)  - 1 x daily - 7 x weekly - 1 sets - 10 reps - 10 hold - Standing Shoulder Row with Anchored Resistance  - 1 x daily - 7 x weekly - 2 sets - 10 reps - 10 hold Added 06/22/24 - Shoulder Flexion Wall Slide with Towel  - 1 x daily - 7 x weekly - 2-3 sets - 10 reps - Sleeper Stretch  - 1 x daily - 7 x weekly - 1 sets - 3-5 reps - 20-30 hold - Standing Shoulder External Rotation Stretch in Doorway  - 1 x daily - 7 x weekly - 3 sets - 3-5 reps - 20-30 hold Added 06/27/24 - Supine PNF D2 Flexion with Resistance  - 1 x daily - 7 x weekly - 2 sets - 10 reps   ASSESSMENT:  CLINICAL IMPRESSION: PT was completed to address GH ROM and RC and peri-scapular strength. With flexion, pt's quality of motion is improved with less shrugging. Pt tolerated prescribed exs without adverse effects. Pt continues to make gradual progress with her R shoulder RC rehab.    OBJECTIVE IMPAIRMENTS: decreased activity tolerance, decreased ROM, decreased strength, impaired UE functional use, and pain.   ACTIVITY LIMITATIONS: carrying, lifting, bathing, toileting, dressing, reach over head, and caring for others  PARTICIPATION LIMITATIONS: meal prep, cleaning, laundry, driving, shopping, community activity, and occupation  PERSONAL FACTORS: Past/current experiences, Time since onset of injury/illness/exacerbation, and 1 comorbidity: DM2 are also affecting patient's functional outcome.   REHAB POTENTIAL: Good  CLINICAL DECISION MAKING: Evolving/moderate  complexity  EVALUATION COMPLEXITY: Moderate   GOALS:  SHORT TERM GOALS: Target date: 05/13/24  Pt will be Ind in an initial HEP  Baseline: started Goal status: MET  2.  Increase R shoulder PROM for flexion to 120d and ER to 65d for appropriate progress with her R shoulder RC rehab  Baseline: 90 and 40 respectively 05/11/24: flexion 120, ER 45  06/27/24 Goal status: Partially met  LONG TERM GOALS: Target date: 08/03/24  Pt will be Ind in  a final HEP to maintain achieved LOF Baseline:  Goal status: ONGOING  2.  Pt will demonstrate AROM for the R shoulder to flex=130, abd=120, ER to T1 and IR to L4 for needed functional ROM for daily activities Baseline:  06/12/24: See flow sheets Goal status: IMPROVED  3.  Pt will report 50% or greater improvement in her r shoulder pain for improved function and QOL Baseline: 8/10 06/12/24: 85% improvement in R shoulder pain Goal status: MET  5.  Pt's Quick Dash score will improve to 69% or better as indication of improved function  Baseline: 84% Goal status: ONGOING  6.  PT will developed advanced R shoulder LTGs as appropriate for her recovery Baseline:  Goal status: MET  7. Pt will be able to reach overhead c a 2# weight for improved funcitonal use of there R arm with daily activities Baseline: Unable Goal status: INITIAL as of 06/12/24  PLAN:  PT FREQUENCY: 2x/week  PT DURATION: 6 weeks  PLANNED INTERVENTIONS: 97164- PT Re-evaluation, 97110-Therapeutic exercises, 97530- Therapeutic activity, 97112- Neuromuscular re-education, 97535- Self Care, [id number]- Manual therapy, [id number]- Electrical stimulation (unattended), 97016- Vasopneumatic device, 20560 (1-2 muscles), 20561 (3+ muscles)- Dry Needling, Patient/Family education, Taping, Joint mobilization, Cryotherapy, and Moist heat  PLAN FOR NEXT SESSION: Assess response to HEP; progress therex as indicated; use of modalities, manual therapy; and TPDN as indicated.  Ayonna Speranza MS,  PT 08/02/2024 9:24 AM

## 2024-08-03 ENCOUNTER — Ambulatory Visit

## 2024-08-03 ENCOUNTER — Telehealth: Payer: Self-pay

## 2024-08-03 NOTE — Telephone Encounter (Signed)
 LVM to pt re: no show on her last scheduled PT appt. Pt was informed of her DC, but may continue as needed with new referral from her MD.

## 2024-08-28 ENCOUNTER — Encounter (HOSPITAL_BASED_OUTPATIENT_CLINIC_OR_DEPARTMENT_OTHER): Payer: Self-pay | Admitting: Orthopedic Surgery

## 2024-09-10 ENCOUNTER — Ambulatory Visit: Admitting: Family Medicine

## 2024-09-28 ENCOUNTER — Other Ambulatory Visit (HOSPITAL_COMMUNITY)

## 2024-10-16 ENCOUNTER — Ambulatory Visit: Admitting: Family Medicine
